# Patient Record
Sex: Female | Born: 1935 | Race: White | Hispanic: No | Smoking: Never smoker
Health system: Southern US, Community
[De-identification: ages and names within clinical notes are randomized; demographics above are authoritative.]

## PROBLEM LIST (undated history)

## (undated) DIAGNOSIS — R600 Localized edema: Secondary | ICD-10-CM

## (undated) DIAGNOSIS — Z7901 Long term (current) use of anticoagulants: Secondary | ICD-10-CM

## (undated) DIAGNOSIS — K449 Diaphragmatic hernia without obstruction or gangrene: Secondary | ICD-10-CM

## (undated) DIAGNOSIS — I10 Essential (primary) hypertension: Secondary | ICD-10-CM

## (undated) DIAGNOSIS — I872 Venous insufficiency (chronic) (peripheral): Secondary | ICD-10-CM

## (undated) DIAGNOSIS — Z86711 Personal history of pulmonary embolism: Secondary | ICD-10-CM

## (undated) DIAGNOSIS — E785 Hyperlipidemia, unspecified: Secondary | ICD-10-CM

## (undated) DIAGNOSIS — J4520 Mild intermittent asthma, uncomplicated: Secondary | ICD-10-CM

## (undated) DIAGNOSIS — D6859 Other primary thrombophilia: Secondary | ICD-10-CM

## (undated) DIAGNOSIS — M1990 Unspecified osteoarthritis, unspecified site: Secondary | ICD-10-CM

## (undated) DIAGNOSIS — K649 Unspecified hemorrhoids: Secondary | ICD-10-CM

## (undated) DIAGNOSIS — N289 Disorder of kidney and ureter, unspecified: Secondary | ICD-10-CM

## (undated) DIAGNOSIS — I252 Old myocardial infarction: Secondary | ICD-10-CM

## (undated) DIAGNOSIS — Z973 Presence of spectacles and contact lenses: Secondary | ICD-10-CM

## (undated) DIAGNOSIS — R0602 Shortness of breath: Secondary | ICD-10-CM

## (undated) DIAGNOSIS — Z8673 Personal history of transient ischemic attack (TIA), and cerebral infarction without residual deficits: Secondary | ICD-10-CM

## (undated) DIAGNOSIS — Z86718 Personal history of other venous thrombosis and embolism: Secondary | ICD-10-CM

## (undated) DIAGNOSIS — I700 Atherosclerosis of aorta: Secondary | ICD-10-CM

## (undated) DIAGNOSIS — Z8679 Personal history of other diseases of the circulatory system: Secondary | ICD-10-CM

## (undated) DIAGNOSIS — K219 Gastro-esophageal reflux disease without esophagitis: Secondary | ICD-10-CM

## (undated) DIAGNOSIS — Z8711 Personal history of peptic ulcer disease: Secondary | ICD-10-CM

## (undated) DIAGNOSIS — I480 Paroxysmal atrial fibrillation: Secondary | ICD-10-CM

## (undated) DIAGNOSIS — R32 Unspecified urinary incontinence: Secondary | ICD-10-CM

## (undated) DIAGNOSIS — R609 Edema, unspecified: Secondary | ICD-10-CM

## (undated) DIAGNOSIS — R3915 Urgency of urination: Secondary | ICD-10-CM

## (undated) DIAGNOSIS — G4762 Sleep related leg cramps: Secondary | ICD-10-CM

## (undated) DIAGNOSIS — Z8719 Personal history of other diseases of the digestive system: Secondary | ICD-10-CM

## (undated) DIAGNOSIS — Z8709 Personal history of other diseases of the respiratory system: Secondary | ICD-10-CM

## (undated) DIAGNOSIS — I5042 Chronic combined systolic (congestive) and diastolic (congestive) heart failure: Secondary | ICD-10-CM

## (undated) DIAGNOSIS — K5730 Diverticulosis of large intestine without perforation or abscess without bleeding: Secondary | ICD-10-CM

## (undated) DIAGNOSIS — G4733 Obstructive sleep apnea (adult) (pediatric): Secondary | ICD-10-CM

## (undated) DIAGNOSIS — I272 Pulmonary hypertension, unspecified: Secondary | ICD-10-CM

## (undated) DIAGNOSIS — D869 Sarcoidosis, unspecified: Secondary | ICD-10-CM

## (undated) DIAGNOSIS — I2510 Atherosclerotic heart disease of native coronary artery without angina pectoris: Secondary | ICD-10-CM

## (undated) DIAGNOSIS — I517 Cardiomegaly: Secondary | ICD-10-CM

## (undated) DIAGNOSIS — I459 Conduction disorder, unspecified: Secondary | ICD-10-CM

## (undated) DIAGNOSIS — F419 Anxiety disorder, unspecified: Secondary | ICD-10-CM

## (undated) DIAGNOSIS — I447 Left bundle-branch block, unspecified: Secondary | ICD-10-CM

## (undated) DIAGNOSIS — D071 Carcinoma in situ of vulva: Secondary | ICD-10-CM

## (undated) DIAGNOSIS — Z87410 Personal history of cervical dysplasia: Secondary | ICD-10-CM

## (undated) DIAGNOSIS — Z950 Presence of cardiac pacemaker: Secondary | ICD-10-CM

## (undated) DIAGNOSIS — IMO0001 Reserved for inherently not codable concepts without codable children: Secondary | ICD-10-CM

## (undated) HISTORY — DX: Presence of cardiac pacemaker: Z95.0

## (undated) HISTORY — PX: ABDOMINAL HYSTERECTOMY: SHX81

## (undated) HISTORY — DX: Venous insufficiency (chronic) (peripheral): I87.2

## (undated) HISTORY — DX: Hyperlipidemia, unspecified: E78.5

## (undated) HISTORY — PX: TUBAL LIGATION: SHX77

## (undated) HISTORY — DX: Essential (primary) hypertension: I10

## (undated) HISTORY — PX: TRANSTHORACIC ECHOCARDIOGRAM: SHX275

## (undated) HISTORY — DX: Anxiety disorder, unspecified: F41.9

## (undated) HISTORY — PX: OTHER SURGICAL HISTORY: SHX169

## (undated) HISTORY — PX: LUMBAR DISC SURGERY: SHX700

## (undated) HISTORY — DX: Disorder of kidney and ureter, unspecified: N28.9

## (undated) HISTORY — PX: CARDIAC CATHETERIZATION: SHX172

## (undated) HISTORY — PX: KNEE ARTHROSCOPY: SUR90

## (undated) HISTORY — DX: Sarcoidosis, unspecified: D86.9

## (undated) HISTORY — PX: CARDIAC PACEMAKER PLACEMENT: SHX583

---

## 1964-12-18 HISTORY — PX: APPENDECTOMY: SHX54

## 1996-01-19 HISTORY — PX: CHOLECYSTECTOMY: SHX55

## 2001-09-09 ENCOUNTER — Other Ambulatory Visit: Admission: RE | Admit: 2001-09-09 | Discharge: 2001-09-09 | Payer: Self-pay | Admitting: Obstetrics and Gynecology

## 2002-02-04 ENCOUNTER — Ambulatory Visit (HOSPITAL_COMMUNITY): Admission: RE | Admit: 2002-02-04 | Discharge: 2002-02-04 | Payer: Self-pay | Admitting: Pulmonary Disease

## 2002-02-04 ENCOUNTER — Encounter: Payer: Self-pay | Admitting: Pulmonary Disease

## 2002-03-10 ENCOUNTER — Other Ambulatory Visit: Admission: RE | Admit: 2002-03-10 | Discharge: 2002-03-10 | Payer: Self-pay | Admitting: Obstetrics and Gynecology

## 2002-03-20 ENCOUNTER — Encounter: Admission: RE | Admit: 2002-03-20 | Discharge: 2002-05-02 | Payer: Self-pay | Admitting: *Deleted

## 2003-08-25 ENCOUNTER — Ambulatory Visit (HOSPITAL_COMMUNITY): Admission: RE | Admit: 2003-08-25 | Discharge: 2003-08-25 | Payer: Self-pay | Admitting: Cardiology

## 2003-08-26 ENCOUNTER — Ambulatory Visit (HOSPITAL_COMMUNITY): Admission: RE | Admit: 2003-08-26 | Discharge: 2003-08-27 | Payer: Self-pay | Admitting: Cardiology

## 2003-08-26 ENCOUNTER — Encounter: Payer: Self-pay | Admitting: Cardiology

## 2003-09-23 ENCOUNTER — Encounter: Payer: Self-pay | Admitting: Pulmonary Disease

## 2003-09-23 ENCOUNTER — Ambulatory Visit (HOSPITAL_COMMUNITY): Admission: RE | Admit: 2003-09-23 | Discharge: 2003-09-23 | Payer: Self-pay | Admitting: Pulmonary Disease

## 2003-11-08 ENCOUNTER — Ambulatory Visit (HOSPITAL_BASED_OUTPATIENT_CLINIC_OR_DEPARTMENT_OTHER): Admission: RE | Admit: 2003-11-08 | Discharge: 2003-11-08 | Payer: Self-pay | Admitting: Pulmonary Disease

## 2003-11-08 ENCOUNTER — Encounter: Payer: Self-pay | Admitting: Pulmonary Disease

## 2004-03-22 ENCOUNTER — Encounter: Payer: Self-pay | Admitting: Cardiology

## 2004-03-22 ENCOUNTER — Ambulatory Visit (HOSPITAL_COMMUNITY): Admission: RE | Admit: 2004-03-22 | Discharge: 2004-03-22 | Payer: Self-pay | Admitting: Cardiology

## 2004-05-17 ENCOUNTER — Encounter: Admission: RE | Admit: 2004-05-17 | Discharge: 2004-08-15 | Payer: Self-pay | Admitting: Internal Medicine

## 2004-09-20 ENCOUNTER — Encounter: Admission: RE | Admit: 2004-09-20 | Discharge: 2004-12-19 | Payer: Self-pay | Admitting: Internal Medicine

## 2004-10-20 ENCOUNTER — Ambulatory Visit: Payer: Self-pay | Admitting: Internal Medicine

## 2004-11-17 ENCOUNTER — Ambulatory Visit: Payer: Self-pay | Admitting: Internal Medicine

## 2004-12-15 ENCOUNTER — Ambulatory Visit: Payer: Self-pay | Admitting: *Deleted

## 2004-12-26 ENCOUNTER — Encounter: Admission: RE | Admit: 2004-12-26 | Discharge: 2005-03-26 | Payer: Self-pay | Admitting: Internal Medicine

## 2005-01-12 ENCOUNTER — Ambulatory Visit: Payer: Self-pay | Admitting: Cardiology

## 2005-02-09 ENCOUNTER — Ambulatory Visit: Payer: Self-pay | Admitting: Internal Medicine

## 2005-03-09 ENCOUNTER — Ambulatory Visit: Payer: Self-pay | Admitting: Cardiology

## 2005-04-06 ENCOUNTER — Ambulatory Visit: Payer: Self-pay | Admitting: Cardiology

## 2005-04-06 ENCOUNTER — Ambulatory Visit: Payer: Self-pay | Admitting: Internal Medicine

## 2005-05-04 ENCOUNTER — Ambulatory Visit: Payer: Self-pay | Admitting: Cardiology

## 2005-06-01 ENCOUNTER — Ambulatory Visit: Payer: Self-pay | Admitting: Internal Medicine

## 2005-06-29 ENCOUNTER — Ambulatory Visit: Payer: Self-pay | Admitting: Cardiology

## 2005-07-27 ENCOUNTER — Ambulatory Visit: Payer: Self-pay | Admitting: Internal Medicine

## 2005-08-15 ENCOUNTER — Ambulatory Visit: Payer: Self-pay | Admitting: Pulmonary Disease

## 2005-08-18 ENCOUNTER — Ambulatory Visit: Payer: Self-pay | Admitting: Pulmonary Disease

## 2005-08-24 ENCOUNTER — Ambulatory Visit: Payer: Self-pay | Admitting: Internal Medicine

## 2005-08-25 ENCOUNTER — Other Ambulatory Visit: Admission: RE | Admit: 2005-08-25 | Discharge: 2005-08-25 | Payer: Self-pay | Admitting: Obstetrics and Gynecology

## 2005-09-03 ENCOUNTER — Emergency Department (HOSPITAL_COMMUNITY): Admission: EM | Admit: 2005-09-03 | Discharge: 2005-09-03 | Payer: Self-pay | Admitting: Emergency Medicine

## 2005-09-07 ENCOUNTER — Ambulatory Visit: Payer: Self-pay | Admitting: Pulmonary Disease

## 2005-09-13 ENCOUNTER — Encounter: Admission: RE | Admit: 2005-09-13 | Discharge: 2005-09-13 | Payer: Self-pay | Admitting: Neurology

## 2005-09-13 ENCOUNTER — Inpatient Hospital Stay (HOSPITAL_COMMUNITY): Admission: EM | Admit: 2005-09-13 | Discharge: 2005-09-15 | Payer: Self-pay | Admitting: Emergency Medicine

## 2005-09-14 ENCOUNTER — Ambulatory Visit: Payer: Self-pay | Admitting: Cardiology

## 2005-09-14 ENCOUNTER — Encounter: Payer: Self-pay | Admitting: Cardiology

## 2005-09-15 ENCOUNTER — Ambulatory Visit: Payer: Self-pay | Admitting: Pulmonary Disease

## 2005-09-15 ENCOUNTER — Ambulatory Visit: Payer: Self-pay | Admitting: Physical Medicine & Rehabilitation

## 2005-09-15 ENCOUNTER — Inpatient Hospital Stay (HOSPITAL_COMMUNITY)
Admission: RE | Admit: 2005-09-15 | Discharge: 2005-09-21 | Payer: Self-pay | Admitting: Physical Medicine & Rehabilitation

## 2005-09-26 ENCOUNTER — Encounter
Admission: RE | Admit: 2005-09-26 | Discharge: 2005-10-17 | Payer: Self-pay | Admitting: Physical Medicine & Rehabilitation

## 2005-10-17 ENCOUNTER — Ambulatory Visit: Payer: Self-pay | Admitting: Pulmonary Disease

## 2005-10-20 ENCOUNTER — Ambulatory Visit: Payer: Self-pay | Admitting: Cardiology

## 2005-11-14 ENCOUNTER — Ambulatory Visit: Payer: Self-pay | Admitting: Internal Medicine

## 2005-11-15 ENCOUNTER — Encounter: Admission: RE | Admit: 2005-11-15 | Discharge: 2005-12-27 | Payer: Self-pay | Admitting: Family Medicine

## 2006-08-15 ENCOUNTER — Ambulatory Visit: Payer: Self-pay | Admitting: Pulmonary Disease

## 2006-10-03 ENCOUNTER — Ambulatory Visit: Payer: Self-pay | Admitting: Pulmonary Disease

## 2006-11-21 ENCOUNTER — Ambulatory Visit: Payer: Self-pay | Admitting: Internal Medicine

## 2007-02-06 ENCOUNTER — Ambulatory Visit: Payer: Self-pay | Admitting: Pulmonary Disease

## 2007-03-19 ENCOUNTER — Ambulatory Visit: Payer: Self-pay | Admitting: Pulmonary Disease

## 2007-03-19 LAB — CONVERTED CEMR LAB
ALT: 19 units/L (ref 0–40)
AST: 25 units/L (ref 0–37)
Albumin: 3.6 g/dL (ref 3.5–5.2)
Basophils Absolute: 0.1 10*3/uL (ref 0.0–0.1)
Bilirubin, Direct: 0.1 mg/dL (ref 0.0–0.3)
Calcium: 9.5 mg/dL (ref 8.4–10.5)
Chloride: 101 meq/L (ref 96–112)
Eosinophils Absolute: 0.2 10*3/uL (ref 0.0–0.6)
GFR calc Af Amer: 80 mL/min
GFR calc non Af Amer: 66 mL/min
Lymphocytes Relative: 22.1 % (ref 12.0–46.0)
MCHC: 35 g/dL (ref 30.0–36.0)
MCV: 88.7 fL (ref 78.0–100.0)
Neutro Abs: 4.3 10*3/uL (ref 1.4–7.7)
Platelets: 234 10*3/uL (ref 150–400)
RBC: 4.1 M/uL (ref 3.87–5.11)
Sed Rate: 35 mm/hr — ABNORMAL HIGH (ref 0–25)
Sodium: 139 meq/L (ref 135–145)

## 2007-03-21 ENCOUNTER — Ambulatory Visit: Payer: Self-pay | Admitting: Internal Medicine

## 2007-04-03 ENCOUNTER — Ambulatory Visit: Payer: Self-pay | Admitting: Pulmonary Disease

## 2007-04-03 LAB — CONVERTED CEMR LAB
Angiotensin 1 Converting Enzyme: 65 units/L (ref 9–67)
Anti Nuclear Antibody(ANA): NEGATIVE

## 2007-04-22 ENCOUNTER — Ambulatory Visit (HOSPITAL_BASED_OUTPATIENT_CLINIC_OR_DEPARTMENT_OTHER): Admission: RE | Admit: 2007-04-22 | Discharge: 2007-04-22 | Payer: Self-pay | Admitting: Surgery

## 2007-04-22 ENCOUNTER — Encounter (INDEPENDENT_AMBULATORY_CARE_PROVIDER_SITE_OTHER): Payer: Self-pay | Admitting: Specialist

## 2007-04-22 HISTORY — PX: OTHER SURGICAL HISTORY: SHX169

## 2007-06-11 ENCOUNTER — Ambulatory Visit: Payer: Self-pay | Admitting: Pulmonary Disease

## 2007-08-05 ENCOUNTER — Ambulatory Visit: Payer: Self-pay | Admitting: Pulmonary Disease

## 2007-08-05 LAB — CONVERTED CEMR LAB
ALT: 22 units/L (ref 0–35)
AST: 28 units/L (ref 0–37)
Alkaline Phosphatase: 45 units/L (ref 39–117)
Angiotensin 1 Converting Enzyme: 39 units/L (ref 9–67)
BUN: 21 mg/dL (ref 6–23)
Basophils Relative: 0 % (ref 0.0–1.0)
Bilirubin, Direct: 0.1 mg/dL (ref 0.0–0.3)
CO2: 30 meq/L (ref 19–32)
Calcium: 9.5 mg/dL (ref 8.4–10.5)
Chloride: 104 meq/L (ref 96–112)
Creatinine, Ser: 1.1 mg/dL (ref 0.4–1.2)
Eosinophils Relative: 0.5 % (ref 0.0–5.0)
GFR calc Af Amer: 64 mL/min
Glucose, Bld: 116 mg/dL — ABNORMAL HIGH (ref 70–99)
Lymphocytes Relative: 9.2 % — ABNORMAL LOW (ref 12.0–46.0)
Monocytes Relative: 4.9 % (ref 3.0–11.0)
Neutro Abs: 9.9 10*3/uL — ABNORMAL HIGH (ref 1.4–7.7)
Platelets: 254 10*3/uL (ref 150–400)
RDW: 13.8 % (ref 11.5–14.6)
Total Bilirubin: 0.8 mg/dL (ref 0.3–1.2)
Total Protein: 7.8 g/dL (ref 6.0–8.3)
WBC: 11.7 10*3/uL — ABNORMAL HIGH (ref 4.5–10.5)

## 2007-09-30 DIAGNOSIS — K649 Unspecified hemorrhoids: Secondary | ICD-10-CM | POA: Insufficient documentation

## 2007-09-30 DIAGNOSIS — F411 Generalized anxiety disorder: Secondary | ICD-10-CM | POA: Insufficient documentation

## 2007-09-30 DIAGNOSIS — I6350 Cerebral infarction due to unspecified occlusion or stenosis of unspecified cerebral artery: Secondary | ICD-10-CM | POA: Insufficient documentation

## 2007-09-30 DIAGNOSIS — I272 Pulmonary hypertension, unspecified: Secondary | ICD-10-CM | POA: Insufficient documentation

## 2007-09-30 DIAGNOSIS — M545 Low back pain: Secondary | ICD-10-CM | POA: Insufficient documentation

## 2007-09-30 DIAGNOSIS — J209 Acute bronchitis, unspecified: Secondary | ICD-10-CM | POA: Insufficient documentation

## 2007-10-08 ENCOUNTER — Other Ambulatory Visit: Admission: RE | Admit: 2007-10-08 | Discharge: 2007-10-08 | Payer: Self-pay | Admitting: Obstetrics and Gynecology

## 2007-11-21 ENCOUNTER — Ambulatory Visit: Payer: Self-pay | Admitting: Internal Medicine

## 2007-12-10 ENCOUNTER — Ambulatory Visit: Payer: Self-pay | Admitting: Pulmonary Disease

## 2007-12-10 DIAGNOSIS — D869 Sarcoidosis, unspecified: Secondary | ICD-10-CM | POA: Insufficient documentation

## 2007-12-10 DIAGNOSIS — I10 Essential (primary) hypertension: Secondary | ICD-10-CM | POA: Insufficient documentation

## 2007-12-10 DIAGNOSIS — E669 Obesity, unspecified: Secondary | ICD-10-CM

## 2007-12-10 DIAGNOSIS — K279 Peptic ulcer, site unspecified, unspecified as acute or chronic, without hemorrhage or perforation: Secondary | ICD-10-CM | POA: Insufficient documentation

## 2007-12-10 DIAGNOSIS — J310 Chronic rhinitis: Secondary | ICD-10-CM

## 2007-12-10 DIAGNOSIS — I872 Venous insufficiency (chronic) (peripheral): Secondary | ICD-10-CM | POA: Insufficient documentation

## 2007-12-11 DIAGNOSIS — R55 Syncope and collapse: Secondary | ICD-10-CM

## 2007-12-13 ENCOUNTER — Encounter: Payer: Self-pay | Admitting: Pulmonary Disease

## 2007-12-23 ENCOUNTER — Telehealth (INDEPENDENT_AMBULATORY_CARE_PROVIDER_SITE_OTHER): Payer: Self-pay | Admitting: *Deleted

## 2007-12-23 LAB — CONVERTED CEMR LAB
CO2: 32 meq/L (ref 19–32)
Chloride: 104 meq/L (ref 96–112)
Creatinine, Ser: 1 mg/dL (ref 0.4–1.2)
Potassium: 4.4 meq/L (ref 3.5–5.1)
Sed Rate: 34 mm/hr — ABNORMAL HIGH (ref 0–25)
Sodium: 142 meq/L (ref 135–145)

## 2007-12-31 ENCOUNTER — Telehealth: Payer: Self-pay | Admitting: Pulmonary Disease

## 2008-01-31 ENCOUNTER — Encounter: Payer: Self-pay | Admitting: Pulmonary Disease

## 2008-02-03 ENCOUNTER — Encounter: Payer: Self-pay | Admitting: Pulmonary Disease

## 2008-02-04 ENCOUNTER — Ambulatory Visit: Payer: Self-pay | Admitting: Pulmonary Disease

## 2008-02-06 ENCOUNTER — Encounter: Payer: Self-pay | Admitting: Pulmonary Disease

## 2008-02-06 ENCOUNTER — Ambulatory Visit: Payer: Self-pay | Admitting: Family Medicine

## 2008-03-17 ENCOUNTER — Ambulatory Visit (HOSPITAL_BASED_OUTPATIENT_CLINIC_OR_DEPARTMENT_OTHER): Admission: RE | Admit: 2008-03-17 | Discharge: 2008-03-17 | Payer: Self-pay | Admitting: Orthopedic Surgery

## 2008-03-17 HISTORY — PX: OTHER SURGICAL HISTORY: SHX169

## 2008-04-03 ENCOUNTER — Telehealth (INDEPENDENT_AMBULATORY_CARE_PROVIDER_SITE_OTHER): Payer: Self-pay | Admitting: *Deleted

## 2008-04-20 ENCOUNTER — Telehealth (INDEPENDENT_AMBULATORY_CARE_PROVIDER_SITE_OTHER): Payer: Self-pay | Admitting: *Deleted

## 2008-05-04 ENCOUNTER — Ambulatory Visit: Payer: Self-pay | Admitting: Pulmonary Disease

## 2008-06-18 ENCOUNTER — Encounter: Payer: Self-pay | Admitting: Pulmonary Disease

## 2008-07-31 ENCOUNTER — Inpatient Hospital Stay (HOSPITAL_COMMUNITY): Admission: EM | Admit: 2008-07-31 | Discharge: 2008-08-05 | Payer: Self-pay | Admitting: Emergency Medicine

## 2008-07-31 ENCOUNTER — Ambulatory Visit: Payer: Self-pay | Admitting: Cardiology

## 2008-07-31 ENCOUNTER — Ambulatory Visit: Payer: Self-pay

## 2008-08-02 ENCOUNTER — Encounter: Payer: Self-pay | Admitting: Cardiology

## 2008-08-14 ENCOUNTER — Encounter: Payer: Self-pay | Admitting: Pulmonary Disease

## 2008-08-17 ENCOUNTER — Ambulatory Visit: Payer: Self-pay

## 2008-08-25 ENCOUNTER — Ambulatory Visit: Payer: Self-pay | Admitting: Cardiovascular Disease

## 2008-09-04 ENCOUNTER — Ambulatory Visit: Payer: Self-pay | Admitting: Pulmonary Disease

## 2008-09-05 DIAGNOSIS — Z950 Presence of cardiac pacemaker: Secondary | ICD-10-CM | POA: Insufficient documentation

## 2008-09-05 DIAGNOSIS — I2510 Atherosclerotic heart disease of native coronary artery without angina pectoris: Secondary | ICD-10-CM

## 2008-09-05 DIAGNOSIS — E785 Hyperlipidemia, unspecified: Secondary | ICD-10-CM | POA: Insufficient documentation

## 2008-09-07 ENCOUNTER — Telehealth (INDEPENDENT_AMBULATORY_CARE_PROVIDER_SITE_OTHER): Payer: Self-pay | Admitting: *Deleted

## 2008-09-11 ENCOUNTER — Encounter: Payer: Self-pay | Admitting: Pulmonary Disease

## 2008-09-15 ENCOUNTER — Telehealth: Payer: Self-pay | Admitting: Pulmonary Disease

## 2008-11-17 ENCOUNTER — Ambulatory Visit: Payer: Self-pay | Admitting: Internal Medicine

## 2008-11-17 ENCOUNTER — Ambulatory Visit: Payer: Self-pay | Admitting: Cardiovascular Disease

## 2008-11-17 LAB — CONVERTED CEMR LAB
AST: 21 units/L (ref 0–37)
Albumin: 3.6 g/dL (ref 3.5–5.2)
Alkaline Phosphatase: 61 units/L (ref 39–117)
Cholesterol: 154 mg/dL (ref 0–200)
HDL: 37.2 mg/dL — ABNORMAL LOW (ref >39.0)
LDL Cholesterol: 93 mg/dL (ref 0–99)
Total Protein: 8 g/dL (ref 6.0–8.3)
Triglycerides: 117 mg/dL (ref 0–149)

## 2009-01-05 ENCOUNTER — Ambulatory Visit: Payer: Self-pay | Admitting: Pulmonary Disease

## 2009-01-05 DIAGNOSIS — M1990 Unspecified osteoarthritis, unspecified site: Secondary | ICD-10-CM | POA: Insufficient documentation

## 2009-01-05 DIAGNOSIS — M542 Cervicalgia: Secondary | ICD-10-CM | POA: Insufficient documentation

## 2009-01-05 LAB — CONVERTED CEMR LAB
Albumin: 3.7 g/dL (ref 3.5–5.2)
Alkaline Phosphatase: 56 units/L (ref 39–117)
BUN: 17 mg/dL (ref 6–23)
Basophils Absolute: 0 10*3/uL (ref 0.0–0.1)
Eosinophils Absolute: 0.1 10*3/uL (ref 0.0–0.7)
Eosinophils Relative: 1.8 % (ref 0.0–5.0)
GFR calc Af Amer: 80 mL/min
GFR calc non Af Amer: 66 mL/min
HCT: 37.7 % (ref 36.0–46.0)
MCHC: 34.4 g/dL (ref 30.0–36.0)
MCV: 90 fL (ref 78.0–100.0)
Monocytes Absolute: 0.9 10*3/uL (ref 0.1–1.0)
Neutrophils Relative %: 72.4 % (ref 43.0–77.0)
Platelets: 158 10*3/uL (ref 150–400)
Potassium: 4.7 meq/L (ref 3.5–5.1)
RDW: 13.2 % (ref 11.5–14.6)
Sodium: 141 meq/L (ref 135–145)
Total Bilirubin: 0.9 mg/dL (ref 0.3–1.2)

## 2009-01-08 ENCOUNTER — Encounter: Payer: Self-pay | Admitting: Pulmonary Disease

## 2009-01-18 ENCOUNTER — Telehealth (INDEPENDENT_AMBULATORY_CARE_PROVIDER_SITE_OTHER): Payer: Self-pay | Admitting: *Deleted

## 2009-02-04 ENCOUNTER — Encounter: Payer: Self-pay | Admitting: Pulmonary Disease

## 2009-02-08 ENCOUNTER — Ambulatory Visit (HOSPITAL_COMMUNITY): Admission: RE | Admit: 2009-02-08 | Discharge: 2009-02-09 | Payer: Self-pay | Admitting: Orthopedic Surgery

## 2009-02-08 HISTORY — PX: SHOULDER ARTHROSCOPY WITH ROTATOR CUFF REPAIR AND SUBACROMIAL DECOMPRESSION: SHX5686

## 2009-04-07 ENCOUNTER — Encounter (INDEPENDENT_AMBULATORY_CARE_PROVIDER_SITE_OTHER): Payer: Self-pay | Admitting: *Deleted

## 2009-04-13 ENCOUNTER — Encounter: Admission: RE | Admit: 2009-04-13 | Discharge: 2009-04-13 | Payer: Self-pay | Admitting: Orthopedic Surgery

## 2009-06-22 ENCOUNTER — Telehealth: Payer: Self-pay | Admitting: Pulmonary Disease

## 2009-07-05 ENCOUNTER — Ambulatory Visit: Payer: Self-pay | Admitting: Pulmonary Disease

## 2009-07-06 ENCOUNTER — Ambulatory Visit: Payer: Self-pay | Admitting: Pulmonary Disease

## 2009-07-11 DIAGNOSIS — G4733 Obstructive sleep apnea (adult) (pediatric): Secondary | ICD-10-CM

## 2009-07-11 LAB — CONVERTED CEMR LAB
ALT: 16 units/L (ref 0–35)
Basophils Relative: 1.1 % (ref 0.0–3.0)
Chloride: 109 meq/L (ref 96–112)
Cholesterol: 167 mg/dL (ref 0–200)
Eosinophils Relative: 5 % (ref 0.0–5.0)
GFR calc non Af Amer: 65.66 mL/min (ref >60)
Glucose, Bld: 85 mg/dL (ref 70–99)
HDL: 38.6 mg/dL — ABNORMAL LOW (ref >39.00)
Hgb A1c MFr Bld: 5.8 % (ref 4.6–6.5)
LDL Cholesterol: 100 mg/dL — ABNORMAL HIGH (ref 0–99)
Lymphocytes Relative: 20.2 % (ref 12.0–46.0)
Neutrophils Relative %: 59.3 % (ref 43.0–77.0)
Potassium: 3.8 meq/L (ref 3.5–5.1)
RBC: 4.1 M/uL (ref 3.87–5.11)
Sed Rate: 39 mm/hr — ABNORMAL HIGH (ref 0–22)
Sodium: 145 meq/L (ref 135–145)
Total Bilirubin: 0.7 mg/dL (ref 0.3–1.2)
Total Protein: 7.2 g/dL (ref 6.0–8.3)
VLDL: 28 mg/dL (ref 0.0–40.0)
Vit D, 25-Hydroxy: 32 ng/mL (ref 30–89)
WBC: 6.8 10*3/uL (ref 4.5–10.5)

## 2009-07-22 ENCOUNTER — Ambulatory Visit: Payer: Self-pay | Admitting: Pulmonary Disease

## 2009-08-05 ENCOUNTER — Ambulatory Visit (HOSPITAL_BASED_OUTPATIENT_CLINIC_OR_DEPARTMENT_OTHER): Admission: RE | Admit: 2009-08-05 | Discharge: 2009-08-05 | Payer: Self-pay | Admitting: Pulmonary Disease

## 2009-08-05 ENCOUNTER — Encounter: Payer: Self-pay | Admitting: Pulmonary Disease

## 2009-08-17 ENCOUNTER — Ambulatory Visit: Payer: Self-pay | Admitting: Pulmonary Disease

## 2009-08-18 ENCOUNTER — Telehealth (INDEPENDENT_AMBULATORY_CARE_PROVIDER_SITE_OTHER): Payer: Self-pay | Admitting: *Deleted

## 2009-08-26 ENCOUNTER — Ambulatory Visit: Payer: Self-pay | Admitting: Pulmonary Disease

## 2009-08-27 ENCOUNTER — Telehealth: Payer: Self-pay | Admitting: Pulmonary Disease

## 2009-09-07 ENCOUNTER — Ambulatory Visit: Payer: Self-pay | Admitting: Internal Medicine

## 2009-09-20 ENCOUNTER — Telehealth (INDEPENDENT_AMBULATORY_CARE_PROVIDER_SITE_OTHER): Payer: Self-pay | Admitting: *Deleted

## 2009-09-21 ENCOUNTER — Encounter: Payer: Self-pay | Admitting: Pulmonary Disease

## 2009-12-08 ENCOUNTER — Encounter: Payer: Self-pay | Admitting: Internal Medicine

## 2009-12-08 ENCOUNTER — Ambulatory Visit: Payer: Self-pay | Admitting: Internal Medicine

## 2009-12-21 ENCOUNTER — Encounter: Payer: Self-pay | Admitting: Internal Medicine

## 2010-01-03 ENCOUNTER — Ambulatory Visit: Payer: Self-pay | Admitting: Pulmonary Disease

## 2010-02-09 ENCOUNTER — Encounter: Payer: Self-pay | Admitting: Pulmonary Disease

## 2010-03-08 ENCOUNTER — Telehealth: Payer: Self-pay | Admitting: Pulmonary Disease

## 2010-03-09 ENCOUNTER — Encounter: Payer: Self-pay | Admitting: Internal Medicine

## 2010-03-09 ENCOUNTER — Ambulatory Visit: Payer: Self-pay | Admitting: Internal Medicine

## 2010-03-15 ENCOUNTER — Encounter: Payer: Self-pay | Admitting: Internal Medicine

## 2010-04-07 ENCOUNTER — Encounter: Payer: Self-pay | Admitting: Pulmonary Disease

## 2010-04-11 ENCOUNTER — Encounter: Payer: Self-pay | Admitting: Pulmonary Disease

## 2010-04-11 ENCOUNTER — Ambulatory Visit: Payer: Self-pay | Admitting: Internal Medicine

## 2010-04-12 ENCOUNTER — Encounter: Payer: Self-pay | Admitting: Pulmonary Disease

## 2010-06-11 ENCOUNTER — Emergency Department (HOSPITAL_COMMUNITY): Admission: EM | Admit: 2010-06-11 | Discharge: 2010-06-11 | Payer: Self-pay | Admitting: Family Medicine

## 2010-06-15 ENCOUNTER — Ambulatory Visit: Payer: Self-pay | Admitting: Internal Medicine

## 2010-06-16 ENCOUNTER — Telehealth (INDEPENDENT_AMBULATORY_CARE_PROVIDER_SITE_OTHER): Payer: Self-pay | Admitting: *Deleted

## 2010-06-27 ENCOUNTER — Ambulatory Visit: Payer: Self-pay | Admitting: Pulmonary Disease

## 2010-06-28 ENCOUNTER — Encounter: Payer: Self-pay | Admitting: Internal Medicine

## 2010-07-11 ENCOUNTER — Ambulatory Visit: Payer: Self-pay | Admitting: Pulmonary Disease

## 2010-07-18 HISTORY — PX: INSERTION OF VENA CAVA FILTER: SHX5871

## 2010-07-20 ENCOUNTER — Ambulatory Visit: Payer: Self-pay | Admitting: Internal Medicine

## 2010-07-20 ENCOUNTER — Telehealth: Payer: Self-pay | Admitting: Internal Medicine

## 2010-07-22 ENCOUNTER — Ambulatory Visit: Payer: Self-pay | Admitting: Internal Medicine

## 2010-07-22 ENCOUNTER — Ambulatory Visit: Payer: Self-pay | Admitting: Pulmonary Disease

## 2010-07-22 ENCOUNTER — Encounter: Payer: Self-pay | Admitting: Pulmonary Disease

## 2010-07-22 ENCOUNTER — Inpatient Hospital Stay (HOSPITAL_COMMUNITY): Admission: EM | Admit: 2010-07-22 | Discharge: 2010-07-29 | Payer: Self-pay | Admitting: Emergency Medicine

## 2010-07-22 DIAGNOSIS — I2782 Chronic pulmonary embolism: Secondary | ICD-10-CM

## 2010-07-24 ENCOUNTER — Encounter (INDEPENDENT_AMBULATORY_CARE_PROVIDER_SITE_OTHER): Payer: Self-pay | Admitting: Internal Medicine

## 2010-07-25 ENCOUNTER — Ambulatory Visit: Payer: Self-pay | Admitting: Vascular Surgery

## 2010-07-25 ENCOUNTER — Telehealth: Payer: Self-pay | Admitting: Pulmonary Disease

## 2010-07-25 ENCOUNTER — Encounter (INDEPENDENT_AMBULATORY_CARE_PROVIDER_SITE_OTHER): Payer: Self-pay | Admitting: Internal Medicine

## 2010-07-25 DIAGNOSIS — Z8709 Personal history of other diseases of the respiratory system: Secondary | ICD-10-CM

## 2010-07-25 HISTORY — DX: Personal history of other diseases of the respiratory system: Z87.09

## 2010-07-26 ENCOUNTER — Encounter: Payer: Self-pay | Admitting: Internal Medicine

## 2010-07-27 ENCOUNTER — Ambulatory Visit: Payer: Self-pay | Admitting: Physical Medicine & Rehabilitation

## 2010-07-29 ENCOUNTER — Inpatient Hospital Stay (HOSPITAL_COMMUNITY)
Admission: RE | Admit: 2010-07-29 | Discharge: 2010-08-06 | Payer: Self-pay | Admitting: Physical Medicine & Rehabilitation

## 2010-08-05 ENCOUNTER — Ambulatory Visit: Payer: Self-pay | Admitting: Physical Medicine & Rehabilitation

## 2010-08-08 ENCOUNTER — Telehealth (INDEPENDENT_AMBULATORY_CARE_PROVIDER_SITE_OTHER): Payer: Self-pay | Admitting: *Deleted

## 2010-08-08 ENCOUNTER — Encounter: Payer: Self-pay | Admitting: Pulmonary Disease

## 2010-08-11 ENCOUNTER — Encounter: Payer: Self-pay | Admitting: Pulmonary Disease

## 2010-08-17 ENCOUNTER — Ambulatory Visit: Payer: Self-pay | Admitting: Pulmonary Disease

## 2010-08-17 DIAGNOSIS — N390 Urinary tract infection, site not specified: Secondary | ICD-10-CM

## 2010-08-17 DIAGNOSIS — N259 Disorder resulting from impaired renal tubular function, unspecified: Secondary | ICD-10-CM | POA: Insufficient documentation

## 2010-08-17 LAB — CONVERTED CEMR LAB
AST: 37 units/L (ref 0–37)
Alkaline Phosphatase: 62 units/L (ref 39–117)
Bilirubin Urine: NEGATIVE
Eosinophils Relative: 1.6 % (ref 0.0–5.0)
GFR calc non Af Amer: 50.34 mL/min (ref >60)
HCT: 36.3 % (ref 36.0–46.0)
Hemoglobin, Urine: NEGATIVE
Lymphs Abs: 1 10*3/uL (ref 0.7–4.0)
MCV: 90.3 fL (ref 78.0–100.0)
Monocytes Absolute: 0.8 10*3/uL (ref 0.1–1.0)
Nitrite: POSITIVE
Platelets: 234 10*3/uL (ref 150.0–400.0)
Potassium: 5 meq/L (ref 3.5–5.1)
Sodium: 139 meq/L (ref 135–145)
Total Bilirubin: 0.7 mg/dL (ref 0.3–1.2)
Total Protein, Urine: 30 mg/dL
Urine Glucose: NEGATIVE mg/dL
WBC: 9.5 10*3/uL (ref 4.5–10.5)

## 2010-08-18 ENCOUNTER — Encounter: Payer: Self-pay | Admitting: Pulmonary Disease

## 2010-08-22 ENCOUNTER — Emergency Department (HOSPITAL_COMMUNITY): Admission: EM | Admit: 2010-08-22 | Discharge: 2010-08-22 | Payer: Self-pay | Admitting: Emergency Medicine

## 2010-08-23 ENCOUNTER — Encounter: Payer: Self-pay | Admitting: Pulmonary Disease

## 2010-08-29 ENCOUNTER — Inpatient Hospital Stay (HOSPITAL_COMMUNITY): Admission: EM | Admit: 2010-08-29 | Discharge: 2010-09-01 | Payer: Self-pay | Admitting: Emergency Medicine

## 2010-08-29 ENCOUNTER — Ambulatory Visit: Payer: Self-pay | Admitting: Surgery

## 2010-08-29 ENCOUNTER — Telehealth (INDEPENDENT_AMBULATORY_CARE_PROVIDER_SITE_OTHER): Payer: Self-pay | Admitting: *Deleted

## 2010-08-29 ENCOUNTER — Encounter: Payer: Self-pay | Admitting: Pulmonary Disease

## 2010-08-29 ENCOUNTER — Ambulatory Visit: Payer: Self-pay | Admitting: Internal Medicine

## 2010-08-29 DIAGNOSIS — I82409 Acute embolism and thrombosis of unspecified deep veins of unspecified lower extremity: Secondary | ICD-10-CM

## 2010-09-01 ENCOUNTER — Ambulatory Visit: Payer: Self-pay | Admitting: Oncology

## 2010-09-02 ENCOUNTER — Telehealth: Payer: Self-pay | Admitting: Pulmonary Disease

## 2010-09-02 ENCOUNTER — Encounter: Payer: Self-pay | Admitting: Pulmonary Disease

## 2010-09-03 ENCOUNTER — Encounter: Payer: Self-pay | Admitting: Pulmonary Disease

## 2010-09-07 ENCOUNTER — Inpatient Hospital Stay (HOSPITAL_COMMUNITY): Admission: EM | Admit: 2010-09-07 | Discharge: 2010-09-11 | Payer: Self-pay | Admitting: Emergency Medicine

## 2010-09-07 ENCOUNTER — Telehealth: Payer: Self-pay | Admitting: Pulmonary Disease

## 2010-09-07 ENCOUNTER — Encounter (INDEPENDENT_AMBULATORY_CARE_PROVIDER_SITE_OTHER): Payer: Self-pay | Admitting: Emergency Medicine

## 2010-09-09 ENCOUNTER — Ambulatory Visit: Payer: Self-pay | Admitting: Hematology & Oncology

## 2010-09-14 ENCOUNTER — Ambulatory Visit: Payer: Self-pay | Admitting: Pulmonary Disease

## 2010-09-18 ENCOUNTER — Inpatient Hospital Stay (HOSPITAL_COMMUNITY): Admission: EM | Admit: 2010-09-18 | Discharge: 2010-09-27 | Payer: Self-pay | Admitting: Emergency Medicine

## 2010-09-18 ENCOUNTER — Ambulatory Visit: Payer: Self-pay | Admitting: Vascular Surgery

## 2010-09-18 ENCOUNTER — Encounter (INDEPENDENT_AMBULATORY_CARE_PROVIDER_SITE_OTHER): Payer: Self-pay | Admitting: Emergency Medicine

## 2010-09-26 ENCOUNTER — Encounter: Payer: Self-pay | Admitting: Hematology & Oncology

## 2010-09-27 ENCOUNTER — Telehealth: Payer: Self-pay | Admitting: Pulmonary Disease

## 2010-09-27 ENCOUNTER — Ambulatory Visit: Payer: Self-pay | Admitting: Hematology & Oncology

## 2010-10-06 ENCOUNTER — Encounter: Payer: Self-pay | Admitting: Pulmonary Disease

## 2010-10-07 ENCOUNTER — Encounter: Payer: Self-pay | Admitting: Pulmonary Disease

## 2010-10-07 LAB — CBC WITH DIFFERENTIAL (CANCER CENTER ONLY)
BASO#: 0 10*3/uL (ref 0.0–0.2)
EOS%: 2.7 % (ref 0.0–7.0)
Eosinophils Absolute: 0.2 10*3/uL (ref 0.0–0.5)
LYMPH#: 1.3 10*3/uL (ref 0.9–3.3)
MCH: 26.5 pg (ref 26.0–34.0)
MCHC: 32.1 g/dL (ref 32.0–36.0)
MONO%: 9.7 % (ref 0.0–13.0)
NEUT%: 68.8 % (ref 39.6–80.0)
WBC: 6.8 10*3/uL (ref 3.9–10.0)

## 2010-10-11 LAB — COMPREHENSIVE METABOLIC PANEL
AST: 19 U/L (ref 0–37)
Albumin: 4 g/dL (ref 3.5–5.2)
BUN: 20 mg/dL (ref 6–23)
Calcium: 9.6 mg/dL (ref 8.4–10.5)
Chloride: 104 mEq/L (ref 96–112)
Creatinine, Ser: 1.19 mg/dL (ref 0.40–1.20)
Glucose, Bld: 123 mg/dL — ABNORMAL HIGH (ref 70–99)
Potassium: 4.3 mEq/L (ref 3.5–5.3)

## 2010-10-11 LAB — CARDIOLIPIN ANTIBODIES, IGG, IGM, IGA
Anticardiolipin IgA: 4 APL U/mL (ref <22)
Anticardiolipin IgG: 6 GPL U/mL (ref <23)
Anticardiolipin IgM: 8 MPL U/mL (ref <11)

## 2010-10-11 LAB — BETA-2 GLYCOPROTEIN ANTIBODIES: Beta-2-Glycoprotein I IgA: 3 A Units (ref <20)

## 2010-10-11 LAB — D-DIMER, QUANTITATIVE: D-Dimer, Quant: 2.79 ug/mL-FEU — ABNORMAL HIGH (ref 0.00–0.48)

## 2010-10-11 LAB — PROTHROMBIN GENE MUTATION

## 2010-10-11 LAB — ERYTHROPOIETIN: Erythropoietin: 32.1 m[IU]/mL (ref 2.6–34.0)

## 2010-10-11 LAB — FERRITIN: Ferritin: 28 ng/mL (ref 10–291)

## 2010-10-13 LAB — CHROMOGENIC FACTOR X: CHROM XA: 87.3 % (ref 86–146)

## 2010-10-17 ENCOUNTER — Ambulatory Visit: Payer: Self-pay

## 2010-10-17 ENCOUNTER — Encounter: Payer: Self-pay | Admitting: Internal Medicine

## 2010-10-18 ENCOUNTER — Ambulatory Visit: Payer: Self-pay | Admitting: Pulmonary Disease

## 2010-10-24 ENCOUNTER — Encounter: Payer: Self-pay | Admitting: Pulmonary Disease

## 2010-10-27 ENCOUNTER — Ambulatory Visit: Payer: Self-pay | Admitting: Hematology & Oncology

## 2010-10-28 ENCOUNTER — Encounter: Payer: Self-pay | Admitting: Pulmonary Disease

## 2010-10-28 LAB — CBC WITH DIFFERENTIAL (CANCER CENTER ONLY)
BASO#: 0 10*3/uL (ref 0.0–0.2)
BASO%: 0.4 % (ref 0.0–2.0)
EOS%: 1.7 % (ref 0.0–7.0)
HCT: 37.4 % (ref 34.8–46.6)
HGB: 11.9 g/dL (ref 11.6–15.9)
LYMPH%: 22 % (ref 14.0–48.0)
MCHC: 31.7 g/dL — ABNORMAL LOW (ref 32.0–36.0)
MCV: 82 fL (ref 81–101)
MONO#: 0.6 10*3/uL (ref 0.1–0.9)
NEUT%: 68.1 % (ref 39.6–80.0)
RDW: 14.3 % (ref 10.5–14.6)

## 2010-11-15 ENCOUNTER — Encounter: Payer: Self-pay | Admitting: Pulmonary Disease

## 2010-11-25 ENCOUNTER — Encounter: Payer: Self-pay | Admitting: Pulmonary Disease

## 2010-11-25 LAB — CBC WITH DIFFERENTIAL (CANCER CENTER ONLY)
BASO#: 0 10*3/uL (ref 0.0–0.2)
EOS%: 2.4 % (ref 0.0–7.0)
Eosinophils Absolute: 0.2 10*3/uL (ref 0.0–0.5)
HCT: 37.8 % (ref 34.8–46.6)
HGB: 12.3 g/dL (ref 11.6–15.9)
MCH: 26.7 pg (ref 26.0–34.0)
MCHC: 32.6 g/dL (ref 32.0–36.0)
MCV: 82 fL (ref 81–101)
MONO%: 11.6 % (ref 0.0–13.0)
NEUT%: 59.5 % (ref 39.6–80.0)

## 2010-12-06 IMAGING — CR DG CHEST 2V
2 series · 2 of 2 positions shown · non-contrast
Comparison: PA and lateral chest 08/17/2010.

CLINICAL DATA: Fever and shortness of breath.  Cough.

CHEST - 2 VIEW

[w chest pa]
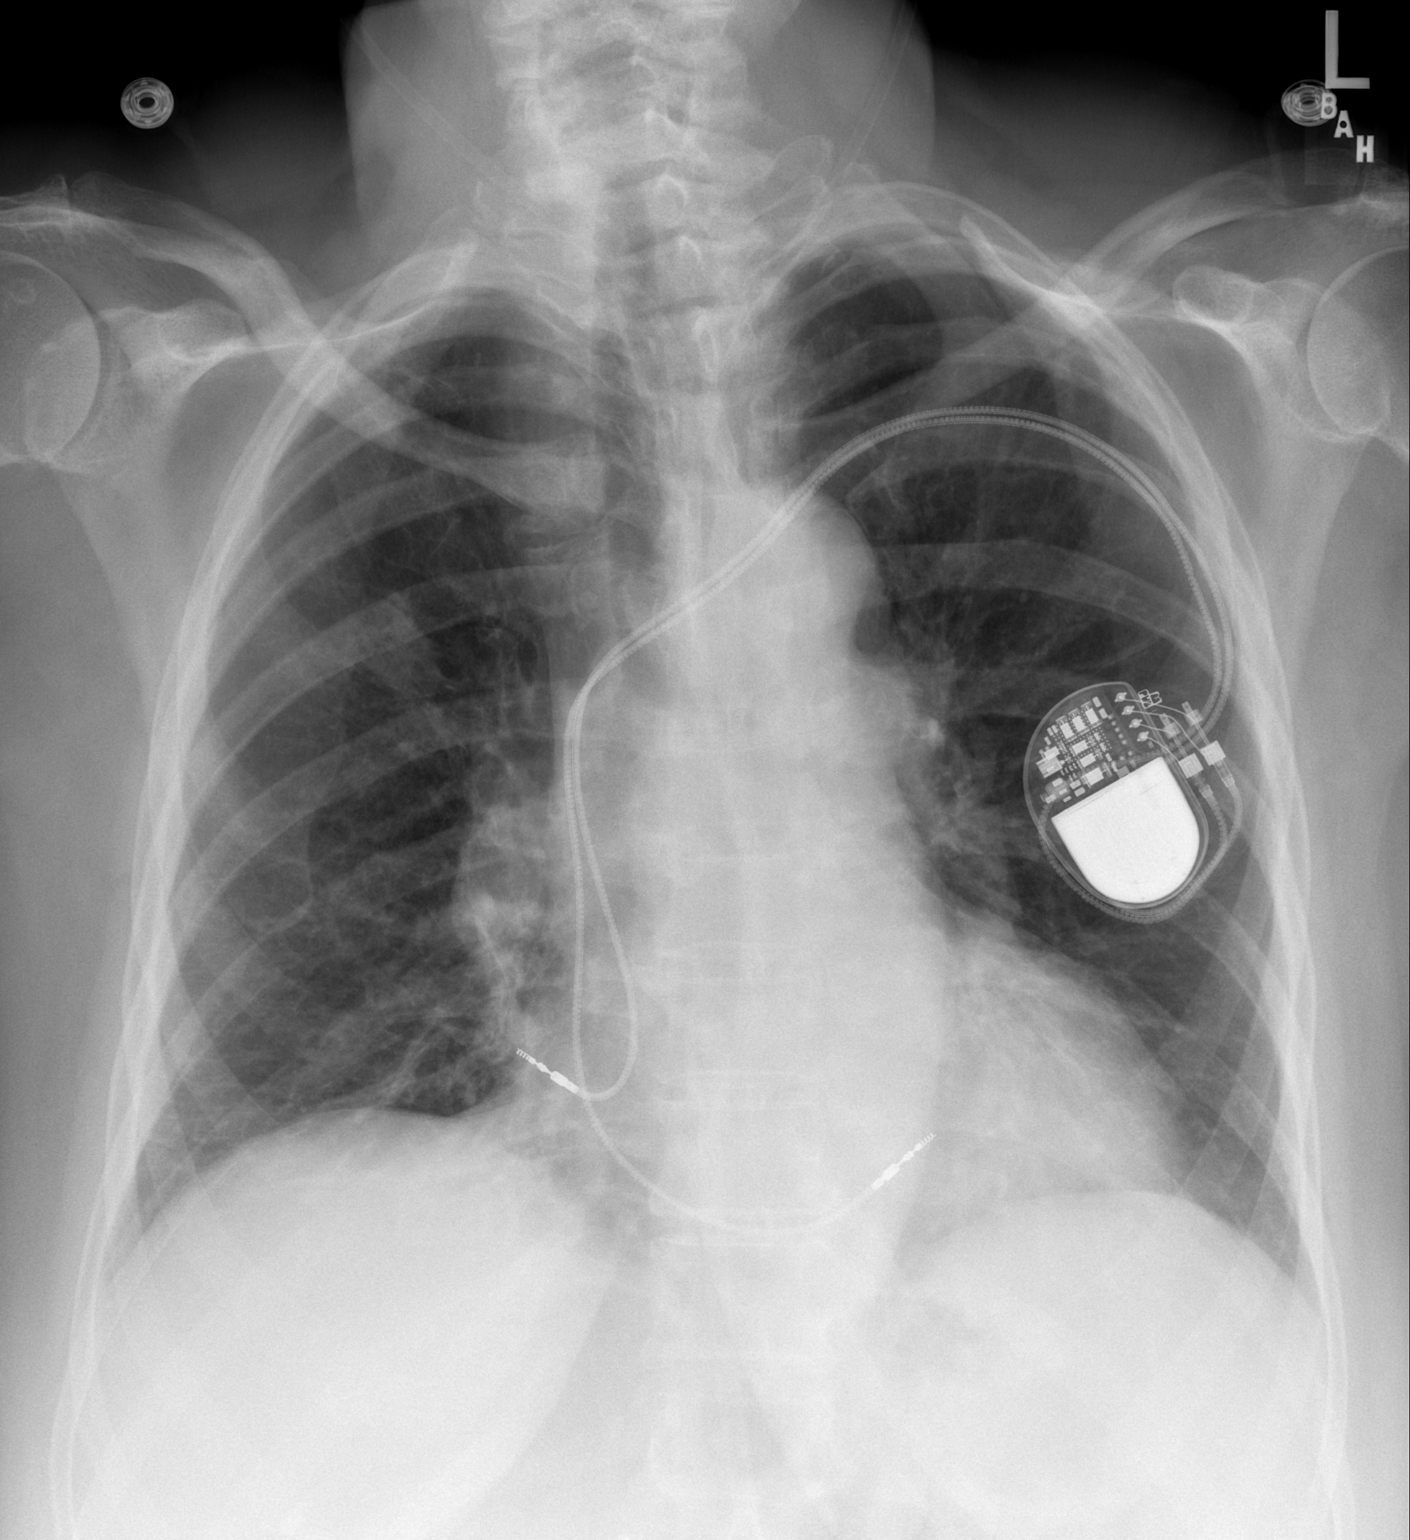

[w chest lat]
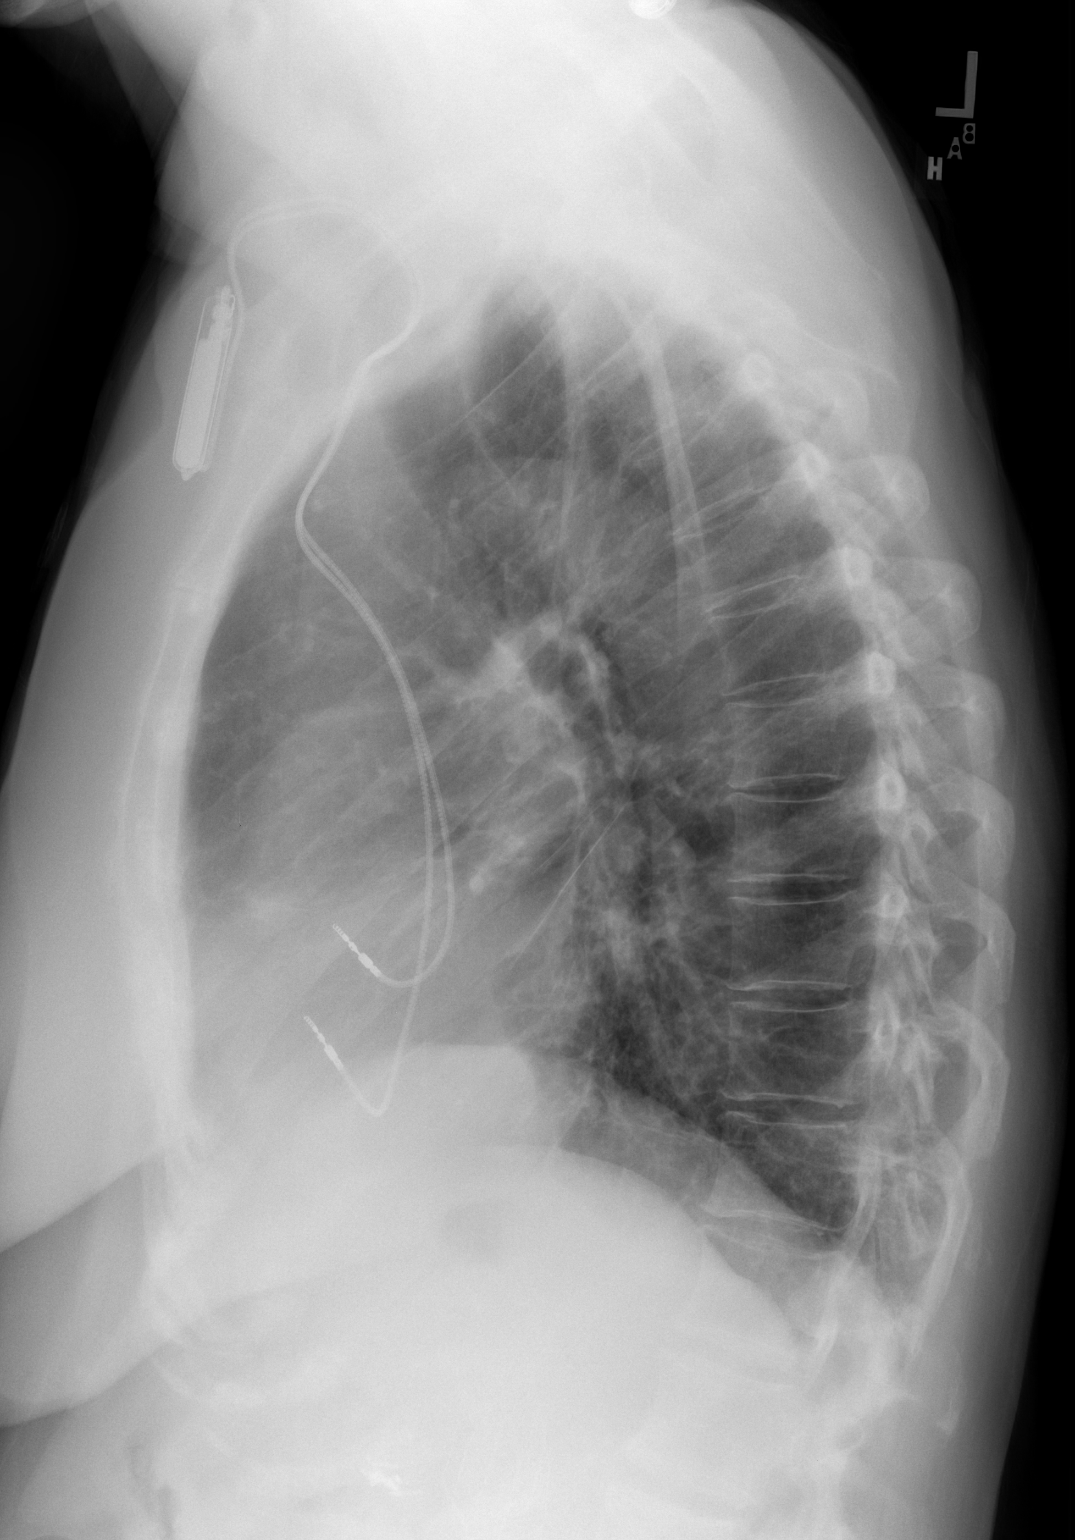

[2 of 2 positions shown; findings below may reference images not displayed]

FINDINGS: Mild atelectasis or scar right lung base unchanged.
Lungs otherwise clear.  Heart size normal.  No effusion.
IMPRESSION: No acute disease.

## 2010-12-15 ENCOUNTER — Encounter: Payer: Self-pay | Admitting: Pulmonary Disease

## 2011-01-09 ENCOUNTER — Telehealth (INDEPENDENT_AMBULATORY_CARE_PROVIDER_SITE_OTHER): Payer: Self-pay | Admitting: *Deleted

## 2011-01-10 ENCOUNTER — Other Ambulatory Visit: Payer: Self-pay | Admitting: Pulmonary Disease

## 2011-01-10 ENCOUNTER — Ambulatory Visit
Admission: RE | Admit: 2011-01-10 | Discharge: 2011-01-10 | Payer: Self-pay | Source: Home / Self Care | Attending: Pulmonary Disease | Admitting: Pulmonary Disease

## 2011-01-10 LAB — HEPATIC FUNCTION PANEL
ALT: 15 U/L (ref 0–35)
AST: 22 U/L (ref 0–37)
Alkaline Phosphatase: 56 U/L (ref 39–117)
Bilirubin, Direct: 0.1 mg/dL (ref 0.0–0.3)
Total Bilirubin: 0.8 mg/dL (ref 0.3–1.2)
Total Protein: 7.6 g/dL (ref 6.0–8.3)

## 2011-01-10 LAB — LIPID PANEL
Cholesterol: 148 mg/dL (ref 0–200)
HDL: 41.3 mg/dL (ref >39.00)
LDL Cholesterol: 78 mg/dL (ref 0–99)
Total CHOL/HDL Ratio: 4
Triglycerides: 143 mg/dL (ref 0.0–149.0)
VLDL: 28.6 mg/dL (ref 0.0–40.0)

## 2011-01-10 LAB — CBC WITH DIFFERENTIAL/PLATELET
HCT: 37.6 % (ref 36.0–46.0)
Hemoglobin: 12.5 g/dL (ref 12.0–15.0)
MCHC: 33.2 g/dL (ref 30.0–36.0)
Platelets: 168 10*3/uL (ref 150.0–400.0)
RDW: 18 % — ABNORMAL HIGH (ref 11.5–14.6)

## 2011-01-10 LAB — BASIC METABOLIC PANEL: CO2: 33 mEq/L — ABNORMAL HIGH (ref 19–32)

## 2011-01-13 ENCOUNTER — Ambulatory Visit: Payer: Self-pay | Admitting: Hematology & Oncology

## 2011-01-16 ENCOUNTER — Other Ambulatory Visit: Payer: Self-pay | Admitting: Hematology & Oncology

## 2011-01-16 ENCOUNTER — Encounter: Payer: Self-pay | Admitting: Pulmonary Disease

## 2011-01-16 ENCOUNTER — Ambulatory Visit (INDEPENDENT_AMBULATORY_CARE_PROVIDER_SITE_OTHER)
Admission: RE | Admit: 2011-01-16 | Discharge: 2011-01-16 | Payer: Medicare Other | Source: Home / Self Care | Attending: Hematology & Oncology | Admitting: Hematology & Oncology

## 2011-01-16 DIAGNOSIS — M79609 Pain in unspecified limb: Secondary | ICD-10-CM

## 2011-01-16 DIAGNOSIS — I82409 Acute embolism and thrombosis of unspecified deep veins of unspecified lower extremity: Secondary | ICD-10-CM

## 2011-01-16 DIAGNOSIS — Z86718 Personal history of other venous thrombosis and embolism: Secondary | ICD-10-CM

## 2011-01-16 LAB — CBC WITH DIFFERENTIAL (CANCER CENTER ONLY)
BASO%: 0.4 % (ref 0.0–2.0)
EOS%: 3.1 % (ref 0.0–7.0)
LYMPH#: 1.4 10*3/uL (ref 0.9–3.3)
MCHC: 33.2 g/dL (ref 32.0–36.0)
NEUT#: 4.2 10*3/uL (ref 1.5–6.5)
NEUT%: 64.1 % (ref 39.6–80.0)
Platelets: 196 10*3/uL (ref 145–400)
RDW: 15.7 % — ABNORMAL HIGH (ref 10.5–14.6)
WBC: 6.6 10*3/uL (ref 3.9–10.0)

## 2011-01-16 LAB — RETICULOCYTES (CHCC)
ABS Retic: 40.7 10*3/uL (ref 19.0–186.0)
Retic Ct Pct: 0.9 % (ref 0.4–3.1)

## 2011-01-16 LAB — CHCC SATELLITE - SMEAR

## 2011-01-17 ENCOUNTER — Ambulatory Visit
Admission: RE | Admit: 2011-01-17 | Discharge: 2011-01-17 | Payer: Self-pay | Source: Home / Self Care | Attending: Pulmonary Disease | Admitting: Pulmonary Disease

## 2011-01-17 NOTE — Letter (Signed)
[facility]   Imported By: Lennie Odor 11/11/2010 14:41:08  _____________________________________________________________________  External Attachment:    Type:   Image     Comment:   External Document

## 2011-01-17 NOTE — Procedures (Signed)
Summary: pacer check/Appoinrment ia a 12:00 noon/nm      Allergies Added:   Current Medications (verified): 1)  Nasonex 50 Mcg/act Susp (Mometasone Furoate) .... 2 Sp in Each Nostril At Bedtime.[name] KitchenMarland Kitchen 2)  Zyrtec Allergy 10 Mg  Tabs (Cetirizine Hcl) .[name] Kitchen.. 1 By Mouth Once Daily 3)  Prednisone 5 Mg  Tabs (Prednisone) .... Take As Directed 4)  Adult Aspirin Ec Low Strength 81 Mg  Tbec (Aspirin) .[name] Kitchen.. 1 By Mouth Once Daily 5)  Metoprolol Succinate 25 Mg Xr24h-Tab (Metoprolol Succinate) .... Take 1 Tablet By Mouth Once A Day 6)  Diovan 160 Mg  Tabs (Valsartan) .[name] Kitchen.. 1 By Mouth Once Daily 7)  Hydrochlorothiazide 25 Mg Tabs (Hydrochlorothiazide) .[name] Kitchen.. 1 By Mouth Once Daily 8)  Klor-Con M20 20 Meq  Tbcr (Potassium Chloride Crys Cr) .[name] Kitchen.. 1 By Mouth Once Daily 9)  Pravastatin Sodium 40 Mg Tabs (Pravastatin Sodium) .... Take 1 Tablet By Mouth Once A Day 10)  Omeprazole 40 Mg Cpdr (Omeprazole) .... Take 1 Tablet By Mouth Two Times A Day 11)  Etodolac 400 Mg Tabs (Etodolac) .... Take 1 Tab By Mouth Two Times A Day W/ Food As Needed For Arthritis Pain... 12)  Oscal 500/200 D-3 500-200 Mg-Unit  Tabs (Calcium-Vitamin D) .... Take One Tablet By Mouth Three Times A Day 13)  Centrum   Tabs (Multiple Vitamins-Minerals) .[name] Kitchen.. 1 By Mouth Once Daily 14)  Protegra   Caps (Multiple Vitamins-Minerals) .[name] Kitchen.. 1 By Mouth Once Daily 15)  Vitamin D-1000 Max St [phone] Mg-Unit Tabs (Calcium-Vitamin D) .... Once Daily 16)  Clorazepate Dipotassium 7.5 Mg  Tabs (Clorazepate Dipotassium) .... Take 1 Tab By Mouth Three Times A Day As Needed For Nerves...  Allergies (verified): 1)  ! Sulfa  PPM Specifications Following MD:  Lewayne Bunting, MD     PPM Vendor:  [employer]     PPM Model Number:  [id number]     PPM Serial Number:  [id number] PPM DOI:  08/04/2008     PPM Implanting MD:  Lewayne Bunting, MD  Lead 1    Location: RA     DOI: 08/04/2008     Model #: [id number]     Serial #: [id number]     Status: active Lead 2    Location: RV     DOI:  08/04/2008     Model #: [id number]     Serial #: [id number]     Status: active   Indications:  Complete heart block   PPM Follow Up Battery Voltage:  2.79 V     Battery Est. Longevity:  9.5 YRS     Pacer Dependent:  Yes       PPM Device Measurements Atrium  Amplitude: 5.60 mV, Impedance: 434 ohms, Threshold: 0.50 V at 0.40 msec Right Ventricle  Amplitude: PACED mV, Impedance: 574 ohms, Threshold: 1.00 V at 0.40 msec  Episodes MS Episodes:  27     Percent Mode Switch:  <0.1%     Ventricular High Rate:  0     Atrial Pacing:  18.6%     Ventricular Pacing:  99.8%  Parameters Mode:  DDDR     Lower Rate Limit:  60     Upper Rate Limit:  130 Paced AV Delay:  170     Sensed AV Delay:  150 Next Cardiology Appt Due:  10/18/2010 Tech Comments:  PT CALLED WITH SOB AND RAPID HEARTBEAT W/EXERCISE.  27 MODE SWITCHES AND 1 VHR EPISODE ON 04-12-10.  PT HR AT REST 65.  PT  WALKED AROUND OFC AND HR WAS 106 bpm.  PT HAD SOME SOB  BUT DIDNT FEEL RAPID HR. CHANGED RA OUTPUT FROM 1.5 TO 2.0 AND RV OUTPUT FROM 2.00 TO 2.5 V.  ROV IN 3 MTHS W/GT. Vella Kohler  July 21, 2010 10:33 AM

## 2011-01-17 NOTE — Cardiovascular Report (Signed)
Summary: Office Visit   Office Visit   Imported By: Roderic Ovens 07/25/2010 15:09:04  _____________________________________________________________________  External Attachment:    Type:   Image     Comment:   External Document

## 2011-01-17 NOTE — Assessment & Plan Note (Signed)
Summary: NP follow up - allergic rx   Copy to:  Kriste Basque Primary Provider/Referring Provider:  Kriste Basque  CC:  rash on arms and chest area x2weeks - pt states she has started no new meds recently.  states dermatologist classified as an allergic rx and gave eucerin cream.  pt takes furosemide 20mg  and states dermatologist thinks this may have caused the allergic rx b/c of its relation to sulfa.  History of Present Illness: 75 y/o WF w/ hx prob sarcoidosis and mild pulm HTN (based on this, obesity, mild sleep apnea, & hx DVT)...    Hospitalized by Cardiology 8/09 w/ high grade block & cath showing non-obstructive CAD, HBP & decr LVF= 40%... Med Rx per [facility] & DrTaylor placed pacemaker... he added Lipitor for her Chol but she doesn't like this med "I can't function" muscle aches & he switched her to Pravachol 12/09 (tolerating better so far)... her BP has been under fair control w/ METOPROLOL 25mg /d, DIOVAN 160mg /d, and LASIX 20mg - 1-2tabs/d & K20/d...   ~  July 05, 2009:  states that she is doing OK... wants refills for 90day supplies... she is c/o "sleepy" & has been taking Soma... Obese w/ weight 216#, had sleep study 2004 w/ RDI= 16 & O2 desat to 78%... also had large # leg jerks but without sleep disruption... we discussed poss repeat sleep study, appt w/ DrClance...   ~  January 03, 2010:  repeat sleep study 8/10 showed mild OSA w/ AHI=15 & desat to 83%... she refused CPAP & opted for the dental appliance- went to [facility] DrEssick w/ mandibular advancement appliance made & she really likes it!!!  Also saw DrTaylor 9/10 w/ pacer functioning well- & BP reasonably well controlled on meds... no new complaints or concerns today... we will check CXR, but she prefers to wait until return OV for her fasting blood work...  June 27, 2010--Presents for work in visit. Complains of rash on arms and chest area x2weeks - states she has started no new meds recently.  states dermatologist classified as an allergic  rx and gave eucerin cream.  pt takes furosemide 20mg  and states dermatologist thinks this may have caused the allergic rx b/c of its relation to sulfa. Describes as pruritic at times. No new meds, skin products. Wants an alternative to lasix. We discussed that most of the diurectics have a very small amount of sulfa deritivie. She has been on lasix for years. Eucerin helps. Denies chest pain, dyspnea, orthopnea, hemoptysis, fever, n/v/d, edema, dysphagia, wheezing.     Medications Prior to Update: 1)  Nasonex 50 Mcg/act Susp (Mometasone Furoate) .... 2 Sp in Each Nostril At Bedtime.[name] KitchenMarland Kitchen 2)  Zyrtec Allergy 10 Mg  Tabs (Cetirizine Hcl) .[name] Kitchen.. 1 By Mouth Once Daily 3)  Prednisone 5 Mg  Tabs (Prednisone) .... Take As Directed 4)  Adult Aspirin Ec Low Strength 81 Mg  Tbec (Aspirin) .[name] Kitchen.. 1 By Mouth Once Daily 5)  Metoprolol Succinate 25 Mg Xr24h-Tab (Metoprolol Succinate) .... Take 1 Tablet By Mouth Once A Day 6)  Diovan 160 Mg  Tabs (Valsartan) .[name] Kitchen.. 1 By Mouth Once Daily 7)  Lasix 20 Mg  Tabs (Furosemide) .[name] Kitchen.. 1-2 Tabs By Mouth Each Morning As Directed... 8)  Klor-Con M20 20 Meq  Tbcr (Potassium Chloride Crys Cr) .[name] Kitchen.. 1 By Mouth Once Daily 9)  Pravastatin Sodium 40 Mg Tabs (Pravastatin Sodium) .... Take 1 Tablet By Mouth Once A Day 10)  Omeprazole 40 Mg Cpdr (Omeprazole) .... Take 1 Tablet By Mouth Two Times  A Day 11)  Etodolac 400 Mg Tabs (Etodolac) .... Take 1 Tab By Mouth Two Times A Day W/ Food As Needed For Arthritis Pain... 12)  Oscal 500/200 D-3 500-200 Mg-Unit  Tabs (Calcium-Vitamin D) .... Take One Tablet By Mouth Three Times A Day 13)  Centrum   Tabs (Multiple Vitamins-Minerals) .[name] Kitchen.. 1 By Mouth Once Daily 14)  Protegra   Caps (Multiple Vitamins-Minerals) .[name] Kitchen.. 1 By Mouth Once Daily 15)  Vitamin D-1000 Max St [phone] Mg-Unit Tabs (Calcium-Vitamin D) .... Once Daily 16)  Clorazepate Dipotassium 7.5 Mg  Tabs (Clorazepate Dipotassium) .... Take 1 Tab By Mouth Three Times A Day As Needed For  Nerves...  Current Medications (verified): 1)  Nasonex 50 Mcg/act Susp (Mometasone Furoate) .... 2 Sp in Each Nostril At Bedtime.[name] KitchenMarland Kitchen 2)  Zyrtec Allergy 10 Mg  Tabs (Cetirizine Hcl) .[name] Kitchen.. 1 By Mouth Once Daily 3)  Prednisone 5 Mg  Tabs (Prednisone) .... Take As Directed 4)  Adult Aspirin Ec Low Strength 81 Mg  Tbec (Aspirin) .[name] Kitchen.. 1 By Mouth Once Daily 5)  Metoprolol Succinate 25 Mg Xr24h-Tab (Metoprolol Succinate) .... Take 1 Tablet By Mouth Once A Day 6)  Diovan 160 Mg  Tabs (Valsartan) .[name] Kitchen.. 1 By Mouth Once Daily 7)  Lasix 20 Mg  Tabs (Furosemide) .[name] Kitchen.. 1-2 Tabs By Mouth Each Morning As Directed... 8)  Klor-Con M20 20 Meq  Tbcr (Potassium Chloride Crys Cr) .[name] Kitchen.. 1 By Mouth Once Daily 9)  Pravastatin Sodium 40 Mg Tabs (Pravastatin Sodium) .... Take 1 Tablet By Mouth Once A Day 10)  Omeprazole 40 Mg Cpdr (Omeprazole) .... Take 1 Tablet By Mouth Two Times A Day 11)  Etodolac 400 Mg Tabs (Etodolac) .... Take 1 Tab By Mouth Two Times A Day W/ Food As Needed For Arthritis Pain... 12)  Oscal 500/200 D-3 500-200 Mg-Unit  Tabs (Calcium-Vitamin D) .... Take One Tablet By Mouth Three Times A Day 13)  Centrum   Tabs (Multiple Vitamins-Minerals) .[name] Kitchen.. 1 By Mouth Once Daily 14)  Protegra   Caps (Multiple Vitamins-Minerals) .[name] Kitchen.. 1 By Mouth Once Daily 15)  Vitamin D-1000 Max St [phone] Mg-Unit Tabs (Calcium-Vitamin D) .... Once Daily 16)  Clorazepate Dipotassium 7.5 Mg  Tabs (Clorazepate Dipotassium) .... Take 1 Tab By Mouth Three Times A Day As Needed For Nerves...  Allergies (verified): 1)  ! Sulfa  Past History:  Past Medical History: Last updated: 01/03/2010 ALLERGIC RHINITIS (ICD-477.9) OBSTRUCTIVE SLEEP APNEA (ICD-327.23) SARCOIDOSIS (ICD-135) PULMONARY HYPERTENSION (ICD-416.8)-venous in origin from right heart cath in past. Hx of ASTHMATIC BRONCHITIS, ACUTE (ICD-466.0) HYPERTENSION (ICD-401.9) CORONARY ARTERY DISEASE (ICD-414.00) Hx of SYNCOPE (ICD-780.2) CARDIAC PACEMAKER IN SITU  (ICD-V45.01) VENOUS INSUFFICIENCY (ICD-459.81) Hx of DVT (ICD-453.40) HYPERLIPIDEMIA (ICD-272.4) OBESITY (ICD-278.00) Hx of PUD (ICD-533.90) HEMORRHOIDS (ICD-455.6) DEGENERATIVE JOINT DISEASE (ICD-715.90) CERVICALGIA (ICD-723.1) BACK PAIN, LUMBAR (ICD-724.2) Hx of STROKE (ICD-434.91) R/O OTHER CONVULSIONS (ICD-780.39) ANXIETY (ICD-300.00)  Past Surgical History: Last updated: 01/03/2010 S/P Appendectomy S/P Cholecystectomy by DrBlievernicht 2/97 S/P Hysterectomy S/P Lumbar Disc Surgery in 1990 S/P Lft Knee Arthroscopy  Family History: Last updated: 09/30/2008 Father died age 4 with MI Mother is 64 and in good health 2 siblings: 1 sister died of cancer at age 66 1 brother died of MI at age 91  Social History: Last updated: Sep 30, 2008 Widow 4 children retired non-smoker no alcohol  Risk Factors: Alcohol Use: 0 (07/22/2009)  Risk Factors: Smoking Status: never (07/22/2009)  Review of Systems      See HPI  Vital Signs:  Patient profile:   75 year  old female Height:      66 inches Weight:      209.25 pounds BMI:     33.90 O2 Sat:      96 % on Room air Temp:     98.2 degrees F oral Pulse rate:   85 / minute BP sitting:   130 / 80  (right arm) Cuff size:   large  Vitals Entered By: [name] CNA/MA (June 27, 2010 11:31 AM)  O2 Flow:  Room air  CC: rash on arms and chest area x2weeks - pt states she has started no new meds recently.  states dermatologist classified as an allergic rx and gave eucerin cream.  pt takes furosemide 20mg  and states dermatologist thinks this may have caused the allergic rx b/c of its relation to sulfa Is Patient Diabetic? No Comments Medications reviewed with patient Daytime contact number verified with patient. [name] CNA/MA  June 27, 2010 11:33 AM    Physical Exam  Additional Exam:  GEN: A/Ox3; pleasant , NAD HEENT:  /AT, , EACs-clear, TMs-wnl, NOSE-clear, THROAT-clear NECK:  Supple w/ fair ROM; no JVD;  normal carotid impulses w/o bruits; no thyromegaly or nodules palpated; no lymphadenopathy. RESP  Clear to P & A; w/o, wheezes/ rales/ or rhonchi. CARD:  RRR, no m/r/g   GI:   Soft & nt; nml bowel sounds; no organomegaly or masses detected. Musco: Warm bil,  no calf tenderness edema, clubbing, pulses intact Neuro: intact w/ no focal deficits noted.  Skin: intact, no lesions or rash noted. she points to several areas but nothing was seen.    Impression & Recommendations:  Problem # 1:  DERMATITIS (ICD-692.9)  ?etiology -possible med side effect from dermatiolgy. Note this is from pt, no notes available will try to obtain derm notes.  Would continue on zyrtec once daily  REC:  will switch lasix to hctz although most diuretics have sulfa derivitive in it, she wants to try alternative.  Low salt diet.  Continue with recommendations from dermatology.  Zyrtec 10mg  at bedtime  follow up Dr. Kriste Basque in 2 months as scheduled.  Her updated medication list for this problem includes:    Zyrtec Allergy 10 Mg Tabs (Cetirizine hcl) .[name] Kitchen... 1 by mouth once daily    Prednisone 5 Mg Tabs (Prednisone) .[name] Kitchen... Take as directed  Orders: Est. Patient Level III ([address])  Problem # 2:  VENOUS INSUFFICIENCY (ICD-459.81) change lasix to hctz.   Medications Added to Medication List This Visit: 1)  Hydrochlorothiazide 25 Mg Tabs (Hydrochlorothiazide) .[name] Kitchen.. 1 by mouth once daily  Complete Medication List: 1)  Nasonex 50 Mcg/act Susp (Mometasone furoate) .... 2 sp in each nostril at bedtime.[name] KitchenMarland Kitchen 2)  Zyrtec Allergy 10 Mg Tabs (Cetirizine hcl) .[name] Kitchen.. 1 by mouth once daily 3)  Prednisone 5 Mg Tabs (Prednisone) .... Take as directed 4)  Adult Aspirin Ec Low Strength 81 Mg Tbec (Aspirin) .[name] Kitchen.. 1 by mouth once daily 5)  Metoprolol Succinate 25 Mg Xr24h-tab (Metoprolol succinate) .... Take 1 tablet by mouth once a day 6)  Diovan 160 Mg Tabs (Valsartan) .[name] Kitchen.. 1 by mouth once daily 7)  Hydrochlorothiazide 25 Mg Tabs  (Hydrochlorothiazide) .[name] Kitchen.. 1 by mouth once daily 8)  Klor-con M20 20 Meq Tbcr (Potassium chloride crys cr) .[name] Kitchen.. 1 by mouth once daily 9)  Pravastatin Sodium 40 Mg Tabs (Pravastatin sodium) .... Take 1 tablet by mouth once a day 10)  Omeprazole 40 Mg Cpdr (Omeprazole) .... Take 1 tablet by mouth two times a day 11)  Etodolac 400 Mg Tabs (Etodolac) .... Take 1 tab by mouth two times a day w/ food as needed for arthritis pain... 12)  Oscal 500/200 D-3 500-200 Mg-unit Tabs (Calcium-vitamin d) .... Take one tablet by mouth three times a day 13)  Centrum Tabs (Multiple vitamins-minerals) .[name] Kitchen.. 1 by mouth once daily 14)  Protegra Caps (Multiple vitamins-minerals) .[name] Kitchen.. 1 by mouth once daily 15)  Vitamin D-1000 Max St [phone] Mg-unit Tabs (Calcium-vitamin d) .... Once daily 16)  Clorazepate Dipotassium 7.5 Mg Tabs (Clorazepate dipotassium) .... Take 1 tab by mouth three times a day as needed for nerves...  Patient Instructions: 1)  May switch Furosemide to Hydrochlorothiazide 25mg  once daily  2)  Low salt diet.  3)  Continue with recommendations from dermatology.  4)  Zyrtec 10mg  at bedtime  5)  follow up Dr. Kriste Basque in 2 months as scheduled.  Prescriptions: HYDROCHLOROTHIAZIDE 25 MG TABS (HYDROCHLOROTHIAZIDE) 1 by mouth once daily  #30 x 5   Entered and Authorized by:   Rubye Oaks NP   Signed by:   Earvin Blazier NP on 06/27/2010   Method used:   Electronically to        [id number] (retail)       [address]       [place], [place]  [address]       Ph: [phone] or [phone]       Fax: [phone]   RxID:   [id number]    Immunization History:  Influenza Immunization History:    Influenza:  historical (09/17/2009)

## 2011-01-17 NOTE — Cardiovascular Report (Signed)
Summary: Office Visit Remote   Office Visit Remote   Imported By: Roderic Ovens 06/29/2010 12:08:21  _____________________________________________________________________  External Attachment:    Type:   Image     Comment:   External Document

## 2011-01-17 NOTE — Progress Notes (Signed)
Summary: leg pain and numbness-pt advised to go to ER  Phone Note Call from Patient Call back at [phone]   Caller: nurse vivian Call For: nadel Summary of Call: pt having pain and numbness in her legs. trouble walking would like  office visit  today with dr Kriste Basque. Initial call taken by: Rickard Patience,  September 07, 2010 9:05 AM  Follow-up for Phone Call        Spoke to Maureen Ralphs and she states she saw the pt last night and she was c/o numbness and tingling in right upper thigh. Maureen Ralphs called the pt back this morning and she states she could not lay on her right side and that her feet were so swollen that she could not walk. Treasa School the pt to go to the ER to be evaluated because she was just discharged with diagnosis of DVT. I will forward to SN to make him aware pt went to ER. Carron Curie CMA  September 07, 2010 10:01 AM   Additional Follow-up for Phone Call Additional follow up Details #1::        SN is aware Randell Loop CMA  September 07, 2010 3:12 PM

## 2011-01-17 NOTE — Cardiovascular Report (Signed)
Summary: Office Visit Remote   Office Visit Remote   Imported By: Roderic Ovens 12/23/2009 12:47:56  _____________________________________________________________________  External Attachment:    Type:   Image     Comment:   External Document

## 2011-01-17 NOTE — Miscellaneous (Signed)
Summary: BONE DENSITY  Clinical Lists Changes  Orders: Added new Test order of T-Lumbar Vertebral Assessment (77082) - Signed Added new Test order of T-Bone Densitometry (77080) - Signed 

## 2011-01-17 NOTE — Letter (Signed)
Summary: CMN for Nebulizer/[facility]  CMN for Nebulizer/[facility]   Imported By: Sherian Rein 08/29/2010 08:26:43  _____________________________________________________________________  External Attachment:    Type:   Image     Comment:   External Document

## 2011-01-17 NOTE — Progress Notes (Signed)
Summary: prescript  Phone Note From Pharmacy Call back at [phone] ex [phone]   [facility]er[facility] Call For: [name] [name]  Summary of Call: calling about prescript that was faxed on sept 12 for duo neb solution. pt would like combination vials. Initial call taken by: Rickard Patience,  September 02, 2010 9:36 AM  Follow-up for Phone Call        Spoke with Halina Maidens and they did not receive signed fax so I gave the vrbal to the pharmacists. Carron Curie CMA  September 02, 2010 10:46 AM

## 2011-01-17 NOTE — Letter (Signed)
Summary: [facility]  [facility]   Imported By: Lester [place] 06/16/2010 10:31:29  _____________________________________________________________________  External Attachment:    Type:   Image     Comment:   External Document

## 2011-01-17 NOTE — Letter (Signed)
Summary: Remote Device Check  [facility], Main Office  [address]   [place], [place] [address]   Phone: [phone]  Fax: [phone]     March 15, 2010 MRN: [id number]   [facility] [address] [place], [place]  [address]   Dear Ms. Bagg,   Your remote transmission was recieved and reviewed by your physician.  All diagnostics were within normal limits for you.  __X___Your next transmission is scheduled for:    June 15, 2010.  Please transmit at any time this day.  If you have a wireless device your transmission will be sent automatically.     Sincerely,  Proofreader

## 2011-01-17 NOTE — Assessment & Plan Note (Signed)
Summary: rov for osa   Copy to:  Kriste Basque Primary Provider/Referring Provider:  Kriste Basque  CC:  Pt is here for a f/u appt.  Pt was last seen by [facility] Sept 2010.  Pt states she is currently on a dental appliance to treat her OSA.  Pt states Dr. Orpah Greek referred pt back to [facility] to discuss switching from oral appliance to a cpap.  Pt states Dr. Orpah Greek is worried oral appliance is not adequately treating OSA.  Pt states she feels very "tired throughout the day."  Pt states she goes to sleep approx 9 to 10pm and gets up approx 8 am.  pt states her sleep is "very restless" but believes this is d/t the heat.  [name] Kitchen  History of Present Illness: the pt comes in today for f/u of mild osa.  She is being treated with a dental appliance near her maximal protrusion, and does not feel she can tolerate further advancement.  She feels that she is not rested in the am's despite wearing her appliance, but does admit to chronic pain at night that can disrupt her sleep.  She denies any environmental issues, and gives no history suggestive of RLS.  She does take chlorazepate 3 times a day, and this certainly can make her sleepy.  Medications Prior to Update: 1)  Nasonex 50 Mcg/act Susp (Mometasone Furoate) .... 2 Sp in Each Nostril At Bedtime.[name] KitchenMarland Kitchen 2)  Zyrtec Allergy 10 Mg  Tabs (Cetirizine Hcl) .[name] Kitchen.. 1 By Mouth Once Daily 3)  Prednisone 5 Mg  Tabs (Prednisone) .... Take As Directed 4)  Adult Aspirin Ec Low Strength 81 Mg  Tbec (Aspirin) .[name] Kitchen.. 1 By Mouth Once Daily 5)  Metoprolol Succinate 25 Mg Xr24h-Tab (Metoprolol Succinate) .... Take 1 Tablet By Mouth Once A Day 6)  Diovan 160 Mg  Tabs (Valsartan) .[name] Kitchen.. 1 By Mouth Once Daily 7)  Hydrochlorothiazide 25 Mg Tabs (Hydrochlorothiazide) .[name] Kitchen.. 1 By Mouth Once Daily 8)  Klor-Con M20 20 Meq  Tbcr (Potassium Chloride Crys Cr) .[name] Kitchen.. 1 By Mouth Once Daily 9)  Pravastatin Sodium 40 Mg Tabs (Pravastatin Sodium) .... Take 1 Tablet By Mouth Once A Day 10)  Omeprazole 40 Mg Cpdr (Omeprazole) .... Take 1  Tablet By Mouth Two Times A Day 11)  Etodolac 400 Mg Tabs (Etodolac) .... Take 1 Tab By Mouth Two Times A Day W/ Food As Needed For Arthritis Pain... 12)  Oscal 500/200 D-3 500-200 Mg-Unit  Tabs (Calcium-Vitamin D) .... Take One Tablet By Mouth Three Times A Day 13)  Centrum   Tabs (Multiple Vitamins-Minerals) .[name] Kitchen.. 1 By Mouth Once Daily 14)  Protegra   Caps (Multiple Vitamins-Minerals) .[name] Kitchen.. 1 By Mouth Once Daily 15)  Vitamin D-1000 Max St [phone] Mg-Unit Tabs (Calcium-Vitamin D) .... Once Daily 16)  Clorazepate Dipotassium 7.5 Mg  Tabs (Clorazepate Dipotassium) .... Take 1 Tab By Mouth Three Times A Day As Needed For Nerves...  Allergies (verified): 1)  ! Sulfa  Past History:  Past medical, surgical, family and social histories (including risk factors) reviewed, and no changes noted (except as noted below).  Past Medical History: Reviewed history from 01/03/2010 and no changes required. ALLERGIC RHINITIS (ICD-477.9) OBSTRUCTIVE SLEEP APNEA (ICD-327.23) SARCOIDOSIS (ICD-135) PULMONARY HYPERTENSION (ICD-416.8)-venous in origin from right heart cath in past. 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) VENOUS INSUFFICIENCY (ICD-459.81) Hx of DVT (ICD-453.40) HYPERLIPIDEMIA (ICD-272.4) OBESITY (ICD-278.00) Hx of PUD (ICD-533.90) HEMORRHOIDS (ICD-455.6) DEGENERATIVE JOINT DISEASE (ICD-715.90)  CERVICALGIA (ICD-723.1) BACK PAIN, LUMBAR (ICD-724.2) Hx of STROKE (ICD-434.91) R/O OTHER CONVULSIONS (ICD-780.39) ANXIETY (ICD-300.00)  Past Surgical History: Reviewed history from 01/03/2010 and no changes required. S/P Appendectomy S/P Cholecystectomy by DrBlievernicht 2/97 S/P Hysterectomy S/P Lumbar Disc Surgery in 1990 S/P Lft Knee Arthroscopy  Family History: Reviewed history from 09/04/2008 and no changes required. Father died age 65 with MI Mother  is 85 and in good health 2 siblings: 1 sister died of cancer at age 74 1 brother died of MI at age 52  Social History: Reviewed history from 09/04/2008 and no changes required. Widow 4 children retired non-smoker no alcohol  Review of Systems  The patient denies shortness of breath with activity, shortness of breath at rest, productive cough, non-productive cough, coughing up blood, chest pain, irregular heartbeats, acid heartburn, indigestion, loss of appetite, weight change, abdominal pain, difficulty swallowing, sore throat, tooth/dental problems, headaches, nasal congestion/difficulty breathing through nose, sneezing, itching, ear ache, anxiety, depression, hand/feet swelling, joint stiffness or pain, rash, change in color of mucus, and fever.    Vital Signs:  Patient profile:   75 year old female Height:      66 inches Weight:      202.25 pounds BMI:     32.76 O2 Sat:      94 % on Room air Temp:     97.5 degrees F oral Pulse rate:   88 / minute BP sitting:   124 / 72  Vitals Entered By: Arman Filter LPN (July 11, 2010 2:05 PM)  O2 Flow:  Room air CC: Pt is here for a f/u appt.  Pt was last seen by [facility] Sept 2010.  Pt states she is currently on a dental appliance to treat her OSA.  Pt states Dr. Orpah Greek referred pt back to [facility] to discuss switching from oral appliance to a cpap.  Pt states Dr. Orpah Greek is worried oral appliance is not adequately treating OSA.  Pt states she feels very "tired throughout the day."  Pt states she goes to sleep approx 9 to 10pm and gets up approx 8 am.  pt states her sleep is "very restless" but believes this is d/t the heat.   Comments Medications reviewed with patient Arman Filter LPN  July 11, 2010 2:05 PM    Physical Exam  General:  ow female in nad Nose:  narrowed bilat but patent Mouth:  no signficant elongation of soft palate or uvula Lungs:  clear to auscultation Heart:  rrr Extremities:  mild edema, no cyanosis Neurologic:  alert,  does not appear sleepy, moves all 4.   Impression & Recommendations:  Problem # 1:  OBSTRUCTIVE SLEEP APNEA (ICD-327.23) the pt is wearing a dental appliance, but notes nonrestorative sleep and excessive daytime sleepiness.  It is unclear if this has anything to do with her osa.  She has known chronic pain that can interfere with sleep, and takes a hefty dose of sedating meds during the day which can contribute to her perception of daytime sleepiness.  At this point, we really need to document whether she is having persistent osa despite her dental appliance.  I would like to schedule her for a sleep study, and she is agreeable.  Other Orders: Est. Patient Level IV (69629) Sleep Disorder Referral (Sleep Disorder)  Patient Instructions: 1)  will set up for sleep study WHILE WEARING DENTAL APPLIANCE.  Will call you once results are available. 2)  will refill your clorazepate times one while Dr. Kriste Basque is on vacation.  Prescriptions: CLORAZEPATE DIPOTASSIUM 7.5 MG  TABS (CLORAZEPATE DIPOTASSIUM) take 1 tab by mouth three times a day as needed for nerves...  #90 x 0   Entered and Authorized by:   Barbaraann Share MD   Signed by:   Barbaraann Share MD on 07/11/2010   Method used:   Print then Give to Patient   RxID:   [id number]   Appended Document: rov for osa per [facility], he filled pt's Clorazepate as a 1 time curtesy to the pt while Dr. Kriste Basque was out of office.  All future refills need to go through Dr. Kriste Basque.

## 2011-01-17 NOTE — Assessment & Plan Note (Signed)
Summary: Hosp consult for PE   Vital Signs:  Patient profile:   75 year old female O2 Sat:      98 % on 2 L/min Temp:     98 degrees F Pulse rate:   98 / minute Pulse rhythm:   regular Resp:     18 per minute BP supine:   118 / 72  O2 Flow:  2 L/min  Copy to:  Kriste Basque Primary Provider/Referring Provider:  Kriste Basque   History of Present Illness: 75 y/o WF w/ hx prob sarcoidosis and mild pulm HTN (based on this, obesity, mild sleep apnea, & hx DVT). & cerebellar bleed on coumadin  Hospitalized by Cardiology 8/09 w/ high grade block & cath showing non-obstructive CAD, HBP & decr LVF= 40%... [facility] & DrTaylor placed pacemaker... he added Lipitor for her Chol but she doesn't like this med "I can't function" muscle aches & he switched her to Pravachol 12/09 (tolerating better so far).  July 22, 2010 5:23 PM  Presented with dyspnea x 3- 4 days, labs show acute renal failure cr 4.0 range, VQ scan shows several perfusion defects. She does not want to take coumadin at any cost. Pacer check ok 8/3.  Allergies: 1)  ! Sulfa Current Meds:  NASONEX 50 MCG/ACT SUSP (MOMETASONE FUROATE) 2 sp in each nostril at bedtime... ZYRTEC ALLERGY 10 MG  TABS (CETIRIZINE HCL) 1 by mouth once daily PREDNISONE 5 MG  TABS (PREDNISONE) Take as directed ADULT ASPIRIN EC LOW STRENGTH 81 MG  TBEC (ASPIRIN) 1 by mouth once daily METOPROLOL SUCCINATE 25 MG XR24H-TAB (METOPROLOL SUCCINATE) Take 1 tablet by mouth once a day DIOVAN 160 MG  TABS (VALSARTAN) 1 by mouth once daily HYDROCHLOROTHIAZIDE 25 MG TABS (HYDROCHLOROTHIAZIDE) 1 by mouth once daily KLOR-CON M20 20 MEQ  TBCR (POTASSIUM CHLORIDE CRYS CR) 1 by mouth once daily PRAVASTATIN SODIUM 40 MG TABS (PRAVASTATIN SODIUM) Take 1 tablet by mouth once a day OMEPRAZOLE 40 MG CPDR (OMEPRAZOLE) Take 1 tablet by mouth two times a day ETODOLAC 400 MG TABS (ETODOLAC) take 1 tab by mouth two times a day w/ food as needed for arthritis pain... OSCAL 500/200  D-3 500-200 MG-UNIT  TABS (CALCIUM-VITAMIN D) take one tablet by mouth three times a day CENTRUM   TABS (MULTIPLE VITAMINS-MINERALS) 1 by mouth once daily PROTEGRA   CAPS (MULTIPLE VITAMINS-MINERALS) 1 by mouth once daily VITAMIN D-1000 MAX ST [phone] MG-UNIT TABS (CALCIUM-VITAMIN D) once daily CLORAZEPATE DIPOTASSIUM 7.5 MG  TABS (CLORAZEPATE DIPOTASSIUM) take 1 tab by mouth three times a day as needed for nerves...   Past History:  Past Medical History: Last updated: 01/03/2010 ALLERGIC RHINITIS (ICD-477.9) OBSTRUCTIVE SLEEP APNEA (ICD-327.23) SARCOIDOSIS (ICD-135) PULMONARY HYPERTENSION (ICD-416.8)-venous in origin from right heart cath in past. 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) VENOUS INSUFFICIENCY (ICD-459.81) Hx of DVT (ICD-453.40) HYPERLIPIDEMIA (ICD-272.4) OBESITY (ICD-278.00) Hx of PUD (ICD-533.90) HEMORRHOIDS (ICD-455.6) DEGENERATIVE JOINT DISEASE (ICD-715.90) CERVICALGIA (ICD-723.1) BACK PAIN, LUMBAR (ICD-724.2) Hx of STROKE (ICD-434.91) R/O OTHER CONVULSIONS (ICD-780.39) ANXIETY (ICD-300.00)  Past Surgical History: Last updated: 01/03/2010 S/P Appendectomy S/P Cholecystectomy by DrBlievernicht 2/97 S/P Hysterectomy S/P Lumbar Disc Surgery in 1990 S/P Lft Knee Arthroscopy  Family History: Last updated: 09-19-08 Father died age 52 with MI Mother is 51 and in good health 2 siblings: 1 sister died of cancer at age 69 1 brother died of MI at age 42  Social History: Last  updated: 09/04/2008 Widow 4 children retired non-smoker no alcohol  Risk Factors: Smoking Status: never (07/22/2009)  Review of Systems       The patient complains of dyspnea on exertion.  The patient denies anorexia, fever, weight loss, weight gain, vision loss, decreased hearing, hoarseness, chest pain, syncope, peripheral edema, prolonged  cough, headaches, hemoptysis, abdominal pain, melena, hematochezia, severe indigestion/heartburn, hematuria, muscle weakness, suspicious skin lesions, difficulty walking, depression, unusual weight change, and abnormal bleeding.    Physical Exam  Additional Exam:  GEN: A/Ox3; pleasant , NAD HEENT:  [place]/AT, , EACs-clear, TMs-wnl, NOSE-clear, THROAT-clear NECK:  Supple w/ fair ROM; no JVD; normal carotid impulses w/o bruits; no thyromegaly or nodules palpated; no lymphadenopathy. RESP  Clear to P & A; w/o, wheezes/ rales/ or rhonchi. CARD:  RRR, no m/r/g   GI:   Soft & nt; nml bowel sounds; no organomegaly or masses detected. Musco: Warm bil,  no calf tenderness edema, clubbing, pulses intact Neuro: intact w/ no focal deficits noted.  Skin: intact, no lesions or rash noted. she points to several areas but nothing was seen.    Impression & Recommendations:  Problem # 1:  PULMONARY EMBOLISM (ICD-415.1) based on high prob VQ scan Head CT negative, she does not want coumadin at any cost due to h/o ICH in 2006. IV heparin probably OK in the acute setting - has been initiated by Cards. Discussed risks & benefits with pt & her mother. Explained that risk of future events remain high without anticoagulation -( with or without IVC filter ) Morever duplex is pending - Proceed with IVC filter given acute high risk of recurrence Need to re discuss long term anticoagulation - Sq  lovenox can be an option, risk for [facility] would be lower esp in prophylactic doses. Defer to Dr Kriste Basque. Her updated medication list for this problem includes:    Adult Aspirin Ec Low Strength 81 Mg Tbec (Aspirin) .[name] Kitchen... 1 by mouth once daily  Problem # 2:  Acute renal failure per [facility].

## 2011-01-17 NOTE — Assessment & Plan Note (Signed)
Summary: 1 month follow up/la   Primary Care Provider:  Kriste Basque  CC:  1 month ROV & post hosp check....  History of Present Illness: 75 y/o WF followed for mult medical problems...   ~  Aug09:  Hospitalized by Cardiology w/ high grade block & cath showing non-obstructive CAD, HBP & decr LVF= 40%... Med Rx per [facility] & DrTaylor placed pacemaker... he added Lipitor for her Chol but she doesn't like this med "I can't function" muscle aches & he switched her to Pravachol 12/09 (tolerating better)... her BP has been under fair control w/ METOPROLOL 25mg /d, DIOVAN 160mg /d, and LASIX 20mg - 1-2tabs/d & K20/d...   ~  Jul10:  states that she is doing OK... wants refills for 90day supplies... she is c/o "sleepy" & has been taking Soma... Obese w/ weight 216#, had sleep study 2004 w/ RDI= 16 & O2 desat to 78%... also had large # leg jerks but without sleep disruption... we discussed poss repeat sleep study, appt w/ DrClance...   ~  January 03, 2010:  repeat sleep study 8/10 showed mild OSA w/ AHI=15 & desat to 83%... she refused CPAP & opted for the dental appliance- went to [facility] DrEssick w/ mandibular advancement appliance made & she really likes it!!!  Also saw DrTaylor 9/10 w/ pacer functioning well- & BP reasonably well controlled on meds... no new complaints or concerns today... we will check CXR, but she prefers to wait until return OV for her fasting blood work...   ~  August 17, 2010:  in early Aug she developed sudden SOB- she thought it was her heart & saw DrTaylor but pacer was OK... went to ER w/ eval revealing bilat PE by V/Q (couldn't do CTA due to Creat=4.2), also had DDimer>20, BNP=1400, Troponin= 0.35, note- ven dopplers were neg for DVT... hx cerebellar hemorrhage (while on Coumadin in past) & Neuro consult said absolute contraindication for Coumadin Rx, therefore had IVC filter placed & transient IV heparin... Fluid management resulted in improved renal function & BNP (Creat=1.14 at end  of hosp, & BNP=400)... resp management w/ NEBS etc... she was tx to rehab & disch home 08/06/10> since then c/o weak- getting home PT, and nocturnal O2 qualified...    ~  September 14, 2010:  since I saw her last she has been adm x2 w/ DVT- hosp records reviewed in detail: Adm 9/12-15 & disch on Lovenox once daily but had recurrent leg swelling & pain w/ VenDopplers showing a new DVT & Adm 9/21-25 where Lovenox was incr to Bid... now taking Lovenox 80mg Bid- hypercoag w/u showed sl incr homocystine & DrEnnever rec Folic 1mg /d.... she has severe periph edema & wt up 9# in 71mo> we will incr Lasix to 40mg Qam... she is feeling much better, more mobile, & has a great attitude.   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 much improved now & he plans consult to [facility] (appt 11/29)... DCSummary reviewed & meds reconcilled> we discussed ROV 2-3  mo w/ CXR & fasting labs.    Problem List:  ALLERGIC RHINITIS (ICD-477.9) - she uses NASONEX & ZYRTEK per DrKozlow...  OBSTRUCTIVE SLEEP APNEA (ICD-327.23) - she is followed by DrClance...  ~  sleep study 2004 showed RDI= 16, desat to 78%, PLMS= 248 w/o sleep disruption... pt opted for  diet, weight reduction...  ~  repeat sleep study 8/10 showed mild OSA w/ AHI=15 & desat to 83%... she refused CPAP & opted for the dental appliance- went to [facility] DrEssick w/ mandibular advancement appliance made & she is very happy.  ~  f/u DrClance 7/11 w/ persistant daytime hypersomnolence- he considered repeat sleep study.  SARCOIDOSIS (ICD-135) - Eval for dyspnea revealed evid for pulmonary hypertension and CTChest 4/08 showed +lymphadenopathy... she had a supraclavicular node biopsy 5/08 by DrStreck that revealed extensive granulomatous inflammation... ACE level= 65, sed= 35, ANA=  neg, CBC/ chems= normal... PREDNISONE therapy started  & slowly weaned>>> now at 5mg - 1/2 tab Qod.  ~  PFT's 12/08 showed FVC=2.62 84%), FEV1=1.74 (71%), %1sec=67, mid-flows=33%pred...  ~  CXR 9/09 showed stable chr interstitial markings, no adenopathy, scarring at the bases, pacer, NAD...  ~  labs 9/09 showed ACE= 47... rec decr Pred5  to 1/2 tab Qod...  ~  CT Chest 4/10 showed calcif hilar & mediast nodes- old gran dis, bilat scarring is stable (no active lung dis), asymmetric osteoarth in sternoclav joint...  ~  CXR 1/11 showed stable scarring & pacer, NAD.[name] Kitchen.  ~  CXR 8/11 showed similar w/ borderline heart size, pacer, prom PA, & scarring...  PULMONARY HYPERTENSION (ICD-416.8) -  Eval by Cardiology & DrClance 2004- he noted low PVR readings, eventually had sleep study done- see above;  TEE 4/05 showed norm LVF w/ EF=55-65%, no regional wall motion abn, mild MR, mod TR & incr peak pulm sys pressure & norm RV;  etiology believed to be poss combination of sarcoid, obesity, sleep apnea, hx of DVT...  Hx of ASTHMATIC BRONCHITIS, ACUTE (ICD-466.0) - she was dx w/ reflux related airway dis by DrKozlow in 2009...  PULMONARY EMBOLISM (ICD-415.19) 8/11 and subsequent DEEP VENOUS THROMBOPHLEBITIS, LEG, RIGHT (ICD-453.40) ** SEE ABOVE ** now on ARIXTRA 7.5mg  Sq daily & followed by DrEnnever. Hx of DVT (ICD-453.40) - prev on Coumadin & off since cerebellar bleed in 2006... NOTE: she had neg venous dopplers 8/11 adm for PE. VENOUS INSUFFICIENCY (ICD-459.81) - she has VV/ VI and follows a low sodium diet, elevation, support hose, etc...  HYPERTENSION (ICD-401.9) - controlled on METOPROLOL 25mg /d... BP= 130/82 today- denies HA, visual changes, CP, palipit, dizziness, syncope, etc...   CORONARY ARTERY DISEASE (ICD-414.00) - adenosine cardiolite 8/04 showed distal ant & apical infarct w/ mild ischemia & EF=33%;  cardiac cath 9/04 showed min non-obstructive CAD & LVD w/ EF=46%, global HK, 2+MR, PA press 50/24 w/  wedge 20;  TEE 4/05 - see above... Hosp 12-Aug-2023 w/ CP/ SOB/ fatigue- cath showed norm Lmain, 30-40% LAD, 30% ostial CIRC, norm RCA, ant wall HK w/ EF=40%... she had LBBB, high grade AV block, pacer inserted...  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 Hosp 2023-08-12 w/ high grade block- pacer placed and pt feeling better...  ~  last seen in office 9/10 by DrTaylor> gets remote device checks> ?Cards eval during flurry of hospitalizations August 11, 2010?  HYPERLIPIDEMIA (ICD-272.4) - prev on diet alone, Lipitor added 9/09 by DrMcAlhaney but INTOL, changed to PRAVASTATIN 40mg /d...  ~  FLP 8/07 on diet showed TChol 190, TG 131, HDL 31, LDL 133... did not want med Rx.  ~  FLP 08-12-2023 in [place] showed TChol 182, TG 143, HDL 28, LDL 125... Lipitor Rx started.  ~  FLP 12/09 on Lip10 showed TChol 154, TG 117, HDL 37, LDL  93... DrTaylor changed to Prav40 due to muscle aches.  ~  FLP 7/10 on Prav40 showed TChol 167, Tg 140, HDL 39, LDL 100  ~  FLP 9/11 showed TChol 143, TG 100, HDL 43, LDL 80  OBESITY (ICD-278.00) - peak weight of 226# in 2009... discussed diet + exercise.  ~  weight 1/10 = 215#  ~  weight 7/10 = 216#  ~  weight 1/11 = 215#... we reviewed diet + exercise program.  ~  weight 8/11 = 200#  ~  weight 9/11 = 209# .[name] Kitchen. she has incr edema after he back to back hosp adms.  ~  weight 11/11 = 196#  Hx of PUD (ICD-533.90) - on OMEPRAZOLE 40mg  Bid...   HEMORRHOIDS (ICD-455.6) - last colonoscopy 11/01 by DrStark was neg x hems; f/u 10 yrs.  RENAL INSUFFICIENCY (ICD-588.9), & UTI (ICD-599.0) - see 8/11 hosp w/ Creat= 4.2 on adm... improved to 1.14 by disch w/ fluid management.  ~  labs 8/11 post disch showed BUN= 15, Creat= 1.1 (off diuretics etc).  ~  labs 9/11 in hosp showed BUN= 11, Creat= 1.1 (back on diuretics for edema).  DEGENERATIVE JOINT DISEASE (ICD-715.90) - c/o neck & shoulder pain- saw DrRowan w/ XRays of  neck= "6 spurs" per pt & he rec PT which she says didn't help... therefore went to chiropractor w/ some benefit but short lived betw appts... can't do MRI due to pacer... noticed knot at clavicular head near sternum- tender to palp, no known trauma... XRay & CTscan showed severe degen dis... Rx ETODOLAC.  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  ~  Neurology reiterated her absolute contraindication to coumadin Rx during 8/11 hosp w/ PE/ DVT...  R/O OTHER CONVULSIONS (ICD-780.39) - eval by DrWeyman & refer to [facility] for unusual episodes of "fear & gait instability w/ fall">> neg ambulatory EEG from DrDeToledo in the Epilepsy Monitoring Unit... 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 she notes occas headaches behind her left ear and in the right temporal area... Tylenol usually helps these pains...  ANXIETY (ICD-300.00) - on Clorazepate 7.5mg  Tid...   Allergies (verified): 1)  ! Sulfa 2)  ! Imitrex  Comments:  Nurse/Medical Assistant: The patient's medications and allergies were reviewed with the patient and were updated in the Medication and Allergy Lists. [name] CNA/MA  October 18, 2010 3:13 PM   Past History:  Past Medical History: ALLERGIC RHINITIS (ICD-477.9) OBSTRUCTIVE SLEEP APNEA (ICD-327.23) SARCOIDOSIS (ICD-135) PULMONARY HYPERTENSION (ICD-416.8) Hx of ASTHMATIC BRONCHITIS, ACUTE (ICD-466.0) PULMONARY EMBOLISM (ICD-415.19) DEEP VENOUS THROMBOPHLEBITIS, LEG, RIGHT (ICD-453.40) HYPERTENSION (ICD-401.9) CORONARY ARTERY DISEASE (ICD-414.00) Hx of SYNCOPE (ICD-780.2) CARDIAC PACEMAKER IN SITU (ICD-V45.01) VENOUS INSUFFICIENCY (ICD-459.81) HYPERLIPIDEMIA (ICD-272.4) OBESITY (ICD-278.00) Hx of PUD (ICD-533.90) HEMORRHOIDS (ICD-455.6) RENAL INSUFFICIENCY  (ICD-588.9) UTI (ICD-599.0) DEGENERATIVE JOINT DISEASE (ICD-715.90) CERVICALGIA (ICD-723.1) BACK PAIN, LUMBAR (ICD-724.2) Hx of STROKE (ICD-434.91) R/O OTHER CONVULSIONS (ICD-780.39) ANXIETY (ICD-300.00)  Past Surgical History: S/P Appendectomy S/P Cholecystectomy by DrBlievernicht 2/97 S/P Hysterectomy S/P Lumbar Disc Surgery in 1990 S/P Lft Knee Arthroscopy S/P IVC Filter placed 8/11  Family History: Reviewed history from 08/17/2010 and no changes required. Father died age 13 with MI Mother is 84 and in good health 2 siblings: 1 sister died of cancer at age 79 1 brother died of MI at age 36  Social  History: Reviewed history from 08/17/2010 and no changes required. Widow 4 children retired non-smoker no alcohol  Review of Systems      See HPI       The patient complains of dyspnea on exertion and peripheral edema.  The patient denies anorexia, fever, weight loss, weight gain, vision loss, decreased hearing, hoarseness, chest pain, syncope, prolonged cough, headaches, hemoptysis, abdominal pain, melena, hematochezia, severe indigestion/heartburn, hematuria, incontinence, muscle weakness, suspicious skin lesions, transient blindness, difficulty walking, depression, unusual weight change, abnormal bleeding, enlarged lymph nodes, and angioedema.         She is much improved overall since her last sreies of hospitalizations...   Vital Signs:  Patient profile:   75 year old female Height:      66 inches Weight:      195.38 pounds BMI:     31.65 O2 Sat:      94 % on Room air Temp:     98.0 degrees F oral Pulse rate:   100 / minute BP sitting:   130 / 82  (left arm) Cuff size:   regular  Vitals Entered By: [name] CNA/MA (October 18, 2010 3:12 PM)  O2 Flow:  Room air  Physical Exam  Additional Exam:  WD, Overweight, 75 y/o WF in NAD... she states that she is feeling better. GENERAL:  Alert & oriented; pleasant & cooperative... HEENT:  [place]/AT, EOM-wnl,  PERRLA, EACs-clear, TMs-wnl, NOSE- dry MM, THROAT-clear & wnl. NECK:  Supple w/ fair ROM; no JVD; normal carotid impulses w/o bruits; no thyromegaly or nodules palpated; no lymphadenopathy palpated -  scar of prev supraclav LN bx... prominent right clavicular head... CHEST:  Clear to P & A; without wheezes/ rales/ or rhonchi heard... HEART:  Regular rhythm, no murmurs/ rubs/ gallops detected... ABDOMEN:  Obese, soft & nontender; normal bowel sounds; no organomegaly or masses palpated... EXT: without deformities, mild arthritic changes; + ven insuffic and 1+edema- much improved... NEURO:  CN's intact; no focal neuro deficits... DERM:  No lesions noted; no rash etc...    Impression & Recommendations:  Problem # 1:  DEEP VENOUS THROMBOPHLEBITIS, LEG, RIGHT (ICD-453.40) She had PE, DVT- recurrent, has IVC filter from 8/11>  now cared for by Drennever & on Arixtra 7.5mg  Sq daily... he plans consult [facility] clotting disorders clinic- pending 11/15/10... cont ine Rx & f/u Drennever.  Problem # 2:  OBSTRUCTIVE SLEEP APNEA (ICD-327.23) She has seen DrClance & wears a dental appliance from DrEssick [facility]...  Problem # 3:  SARCOIDOSIS (ICD-135) Remains on sm dose Pred 2.5mg  Qod for now.... we discussed re=assessment after 1st of the yr w/ CXR, ACE, etc & consideration of getting off the Pred...  Problem # 4:  PULMONARY HYPERTENSION (ICD-416.8) Multifactorial as noted & clinically improved w/ mult therapeutic modalities...  Problem # 5:  HYPERTENSION (ICD-401.9) Controlled>  same meds. Her updated medication list for this problem includes:    Metoprolol Succinate 25 Mg Xr24h-tab (Metoprolol succinate) .[name] Kitchen... Take 1 tablet by mouth once a day    Furosemide 20 Mg Tabs (Furosemide) .[name] Kitchen... Take 2 tablets by mouth once a day  Problem # 6:  CORONARY ARTERY DISEASE (ICD-414.00) She has CAD, hx syncope- pacer, hx CHF > all followed by DrTaylor for Cards & she is due for ROV... Her updated medication list  for this problem includes:    Metoprolol Succinate 25 Mg Xr24h-tab (Metoprolol succinate) .[name] Kitchen... Take 1 tablet by mouth once a day    Furosemide 20 Mg Tabs (Furosemide) .[name] KitchenMarland KitchenMarland KitchenMarland Kitchen  Take 2 tablets by mouth once a day  Problem # 7:  HYPERLIPIDEMIA (ICD-272.4) Stable on Prav40... Her updated medication list for this problem includes:    Pravastatin Sodium 40 Mg Tabs (Pravastatin sodium) .[name] Kitchen... Take 1 tablet by mouth once a day  Problem # 8:  OTHER MEDICAL PROBLEMS AS NOTED>>>  Complete Medication List: 1)  Arixtra 7.5 Mg/0.30ml Soln (Fondaparinux sodium) .[name] Kitchen.. 1 injection daily 2)  Nasonex 50 Mcg/act Susp (Mometasone furoate) .... 2 sp in each nostril at bedtime.[name] KitchenMarland Kitchen 3)  Zyrtec Allergy 10 Mg Tabs (Cetirizine hcl) .[name] Kitchen.. 1 by mouth once daily 4)  Duoneb 0.5-2.5 (3) Mg/71ml Soln (Ipratropium-albuterol) .... Inhale 1 vial in neb machine two times a day 5)  Flovent Hfa 44 Mcg/act Aero (Fluticasone propionate  hfa) .... 2 puffs two times a day... 6)  Prednisone 5 Mg Tabs (Prednisone) .... 1/2 tab by mouth every other day as directed 7)  Metoprolol Succinate 25 Mg Xr24h-tab (Metoprolol succinate) .... Take 1 tablet by mouth once a day 8)  Furosemide 20 Mg Tabs (Furosemide) .... Take 2 tablets by mouth once a day 9)  Pravastatin Sodium 40 Mg Tabs (Pravastatin sodium) .... Take 1 tablet by mouth once a day 10)  Omeprazole 40 Mg Cpdr (Omeprazole) .... Take 1 tablet by mouth two times a day 11)  Senokot 8.6 Mg Tabs (Sennosides) .... 2 tabs by mouth at bedtime 12)  Centrum Tabs (Multiple vitamins-minerals) .[name] Kitchen.. 1 by mouth once daily 13)  Protegra Caps (Multiple vitamins-minerals) .[name] Kitchen.. 1 by mouth once daily 14)  Vitamin D-[address] [phone] Mg-unit Tabs (Calcium-vitamin d) .... Once daily 15)  Folic Acid 1 Mg Tabs (Folic acid) .... Take 1 tablet by mouth once a day 16)  Clorazepate Dipotassium 7.5 Mg Tabs (Clorazepate dipotassium) .... Take 1 tab by mouth three times a day as needed for nerves... 17)  Systane  0.4-0.3 % Soln (Polyethyl glycol-propyl glycol) .... Place 2 drops in both eyes once daily  Patient Instructions: 1)  Today we updated your med list- see below.... 2)  We decided to continue your current meds the same for now... 3)  Let's plan a follow up visit in 2-3 months w/ CXR & FASTING blood work at that time.[name] KitchenMarland Kitchen 4)  Call for any problems... Prescriptions: DUONEB 0.5-2.5 (3) MG/3ML SOLN (IPRATROPIUM-ALBUTEROL) inhale 1 vial in neb machine two times a day  #180 x 3   Entered by:   [name] CNA/MA   Authorized by:   Michele Mcalpine MD   Signed by:   [name] CNA/MA on 10/18/2010   Method used:   Print then Give to Patient   RxID:   [id number] CLORAZEPATE DIPOTASSIUM 7.5 MG  TABS (CLORAZEPATE DIPOTASSIUM) take 1 tab by mouth three times a day as needed for nerves...  #270 x 3   Entered by:   [name] CNA/MA   Authorized by:   Michele Mcalpine MD   Signed by:   [name] CNA/MA on 10/18/2010   Method used:   Print then Give to Patient   RxID:   [id number] OMEPRAZOLE 40 MG CPDR (OMEPRAZOLE) Take 1 tablet by mouth two times a day  #180 x 3   Entered by:   [name] CNA/MA   Authorized by:   Michele Mcalpine MD   Signed by:   [name] CNA/MA on 10/18/2010   Method used:   Print then Give to Patient   RxID:   [id number] PRAVASTATIN SODIUM 40 MG TABS (PRAVASTATIN  SODIUM) Take 1 tablet by mouth once a day  #90 x 3   Entered by:   [name] CNA/MA   Authorized by:   Michele Mcalpine MD   Signed by:   [name] CNA/MA on 10/18/2010   Method used:   Print then Give to Patient   RxID:   [id number] FLOVENT HFA 44 MCG/ACT AERO (FLUTICASONE PROPIONATE  HFA) 2 puffs two times a day...  #3 x 3   Entered by:   [name] CNA/MA   Authorized by:   Michele Mcalpine MD   Signed by:   [name] CNA/MA on 10/18/2010   Method used:   Print then Give to Patient   RxID:   [id number] NASONEX 50 MCG/ACT SUSP (MOMETASONE FUROATE) 2 sp in each  nostril at bedtime...  #3 x 3   Entered by:   [name] CNA/MA   Authorized by:   Michele Mcalpine MD   Signed by:   [name] CNA/MA on 10/18/2010   Method used:   Print then Give to Patient   RxID:   [id number]

## 2011-01-17 NOTE — Letter (Signed)
Summary: Remote Device Check  [employer], Main Office  [address]   [place], [place] [address]   Phone: [phone]  Fax: [phone]     June 28, 2010 MRN: [id number]   [facility] [address] # [place], [place]  [address]   Dear [name] [name],   Your remote transmission was recieved and reviewed by your physician.  All diagnostics were within normal limits for you.  __X____Your next office visit is scheduled for:  September 2011 with Dr Ladona Ridgel. Please call our office to schedule an appointment.    Sincerely,  Vella Kohler

## 2011-01-17 NOTE — Assessment & Plan Note (Signed)
Summary: 1 MONTH FOLLOW UP/LA   Primary Care Provider:  Kriste Basque  CC:  Post hosp visit & review....  History of Present Illness: 75 y/o WF followed for mult medical problems...   ~  Aug09:  Hospitalized by Cardiology w/ high grade block & cath showing non-obstructive CAD, HBP & decr LVF= 40%... Med Rx per [facility] & DrTaylor placed pacemaker... he added Lipitor for her Chol but she doesn't like this med "I can't function" muscle aches & he switched her to Pravachol 12/09 (tolerating better)... her BP has been under fair control w/ METOPROLOL 25mg /d, DIOVAN 160mg /d, and LASIX 20mg - 1-2tabs/d & K20/d...   ~  Jul10:  states that she is doing OK... wants refills for 90day supplies... she is c/o "sleepy" & has been taking Soma... Obese w/ weight 216#, had sleep study 2004 w/ RDI= 16 & O2 desat to 78%... also had large # leg jerks but without sleep disruption... we discussed poss repeat sleep study, appt w/ DrClance...   ~  January 03, 2010:  repeat sleep study 8/10 showed mild OSA w/ AHI=15 & desat to 83%... she refused CPAP & opted for the dental appliance- went to [facility] DrEssick w/ mandibular advancement appliance made & she really likes it!!!  Also saw DrTaylor 9/10 w/ pacer functioning well- & BP reasonably well controlled on meds... no new complaints or concerns today... we will check CXR, but she prefers to wait until return OV for her fasting blood work...   ~  August 17, 2010:  in early Aug she developed sudden SOB- she thought it was her heart & saw DrTaylor but pacer was OK... went to ER w/ eval revealing bilat PE by V/Q (couldn't do CTA due to Creat=4.2), also had DDimer>20, BNP=1400, Troponin= 0.35, note- ven dopplers were neg for DVT... hx cerebellar hemorrhage (while on Coumadin in past) & Neuro consult said absolute contraindication for Coumadin Rx, therefore had IVC filter placed & transient IV heparin... Fluid management resulted in improved renal function & BNP (Creat=1.14 at end of  hosp, & BNP=400)... resp management w/ NEBS etc... she was tx to rehab & disch home 08/06/10> since then c/o weak- getting home PT, and nocturnal O2 qualified... also notes low grade temps to 100+ esp at night- sl dry cough, no sputum, sore throat+mouth, +DOE, & denies GI or GU symptoms... notes occas epistaxis related to the nasal O2... pt ambulated on RA w/ resting HR=100 & Sat=96%> one lap w/ HR=130 & Sat=95%...   **we decided to check CXR (no acute change), Labs (all improved), UA (+UTI) & Rx empirically w/ Cipro, Tylenol, Nasal Saline, & MMW.   ~  September 14, 2010:  since I saw her last she has been adm x2 w/ DVT- hosp records reviewed in detail: Adm 9/12-15 & disch on Lovenox once daily but had recurrent leg swelling & pain w/ VenDopplers showing a new DVT & Adm 9/21-25 where Lovenox was incr to Bid... now taking Lovenox 80mg Bid- hypercoag w/u showed sl incr homocystine & DrEnnever rec Folic 1mg /d.... she has severe periph edema & wt up 9# in 47mo> we will incr Lasix to 40mg Qam... she is feeling much better, more mobile, & has a great attitude.   Problem List:  ALLERGIC RHINITIS (ICD-477.9) - she uses NASONEX & ZYRTEK per DrKozlow...  OBSTRUCTIVE SLEEP APNEA (ICD-327.23) - she is followed by DrClance...  ~  sleep study 2004 showed RDI= 16, desat to 78%, PLMS= 248 w/o sleep disruption... pt opted for diet, weight reduction...  ~  repeat sleep study 8/10 showed mild OSA w/ AHI=15 & desat to 83%... she refused CPAP & opted for the dental appliance- went to [facility] DrEssick w/ mandibular advancement appliance made & she is very happy.  ~  f/u DrClance 7/11 w/ persistant daytime hypersomnolence- he considered repeat sleep study.  SARCOIDOSIS (ICD-135) - Eval for dyspnea revealed evid for pulmonary hypertension and CTChest 4/08 showed +lymphadenopathy... she had a supraclavicular node biopsy 5/08 by DrStreck that revealed extensive granulomatous inflammation... ACE level= 65, sed= 35, ANA= neg, CBC/  chems= normal... PREDNISONE therapy started  & slowly weaned>>> now at 5mg - 1/2 tab Qod.  ~  PFT's 12/08 showed FVC=2.62 84%), FEV1=1.74 (71%), %1sec=67, mid-flows=33%pred...  ~  CXR 9/09 showed stable chr interstitial markings, no adenopathy, scarring at the bases, pacer, NAD...  ~  labs 9/09 showed ACE= 47... rec decr Pred5  to 1/2 tab Qod...  ~  CT Chest 4/10 showed calcif hilar & mediast nodes- old gran dis, bilat scarring is stable (no active lung dis), asymmetric osteoarth in sternoclav joint...  ~  CXR 1/11 showed stable scarring & pacer, NAD.[name] Kitchen.  ~  CXR 8/11 showed similar w/ borderline heart size, pacer, prom PA, & scarring...  PULMONARY HYPERTENSION (ICD-416.8) -  Eval by Cardiology & DrClance 2004- he noted low PVR readings, eventually had sleep study done- see above;  TEE 4/05 showed norm LVF w/ EF=55-65%, no regional wall motion abn, mild MR, mod TR & incr peak pulm sys pressure & norm RV;  etiology believed to be poss combination of sarcoid, obesity, sleep apnea, hx of DVT...  Hx of ASTHMATIC BRONCHITIS, ACUTE (ICD-466.0) - she was dx w/ reflux related airway dis by DrKozlow in 2009...  PULMONARY EMBOLISM (ICD-415.19) 8/11 and subsequent DEEP VENOUS THROMBOPHLEBITIS, LEG, RIGHT (ICD-453.40) ** SEE ABOVE ** Hx of DVT (ICD-453.40) - prev on Coumadin & off since cerebellar bleed in 2006... NOTE: she had neg venous dopplers 8/11 adm for PE. VENOUS INSUFFICIENCY (ICD-459.81) - she has VV/ VI and follows a low sodium diet, elevation, support hose, etc...  HYPERTENSION (ICD-401.9) - controlled on METOPROLOL 25mg /d... BP= 132/82 today- denies HA, visual changes, CP, palipit, dizziness, syncope, edema, etc...   CORONARY ARTERY DISEASE (ICD-414.00) - adenosine cardiolite 8/04 showed distal ant & apical infarct w/ mild ischemia & EF=33%;  cardiac cath 9/04 showed min non-obstructive CAD & LVD w/ EF=46%, global HK, 2+MR, PA press 50/24 w/ wedge 20;  TEE 4/05 - see above... Hosp 2023/08/09 w/ CP/ SOB/  fatigue- cath showed norm Lmain, 30-40% LAD, 30% ostial CIRC, norm RCA, ant wall HK w/ EF=40%... she had LBBB, high grade AV block, pacer inserted...  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 Hosp 08-09-23 w/ high grade block- pacer placed and pt feeling better...  HYPERLIPIDEMIA (ICD-272.4) - prev on diet alone, Lipitor added 9/09 by DrMcAlhaney but INTOL, changed to PRAVASTATIN 40mg /d...  ~  FLP 8/07 on diet showed TChol 190, TG 131, HDL 31, LDL 133... did not want med Rx.  ~  FLP 09-Aug-2023 in [place] showed TChol 182, TG 143, HDL 28, LDL 125... Lipitor Rx started.  ~  FLP 12/09 on Lip10 showed TChol 154, TG 117, HDL 37, LDL 93... DrTaylor changed to Prav40 due to muscle aches.  ~  FLP 7/10 on Prav40 showed TChol 167, Tg 140, HDL 39, LDL 100  ~  FLP 9/11 showed TChol 143, TG 100, HDL  43, LDL 80  OBESITY (ICD-278.00) - peak weight of 226# in 2009... discussed diet + exercise.  ~  weight 1/10 = 215#  ~  weight 7/10 = 216#  ~  weight 1/11 = 215#... we reviewed diet + exercise program.  ~  weight 8/11 = 200#  ~  weight 9/11 = 209# .[name] Kitchen. she has incr edema after he back to back hosp adms.  Hx of PUD (ICD-533.90) - on OMEPRAZOLE 40mg  Bid...   HEMORRHOIDS (ICD-455.6) - last colonoscopy 11/01 by DrStark was neg x hems; f/u 10 yrs.  RENAL INSUFFICIENCY (ICD-588.9), & UTI (ICD-599.0) - see 8/11 hosp w/ Creat= 4.2 on adm... improved to 1.14 by disch w/ fluid management.  ~  labs 8/11 post disch showed BUN= 15, Creat= 1.1 (off diuretics etc).  ~  labs 9/11 in hosp showed BUN= 11, Creat= 1.1 (back on diuretics for edema).  DEGENERATIVE JOINT DISEASE (ICD-715.90) - c/o neck & shoulder pain- saw DrRowan w/ XRays of neck= "6 spurs" per pt & he rec PT which she says didn't help... therefore went to chiropractor w/ some benefit but short lived betw appts... can't do MRI due to pacer... noticed knot at clavicular head near  sternum- tender to palp, no known trauma... XRay & CTscan showed severe degen dis... Rx ETODOLAC.  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  R/O OTHER CONVULSIONS (ICD-780.39) - eval by DrWeyman & [facility] to [facility] for unusual episodes of "fear & gait instability w/ fall">> neg ambulatory EEG from DrDeToledo in the Epilepsy Monitoring Unit... 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 she notes occas headaches behind her left ear and in the right temporal area... Tylenol usually helps these pains...  ANXIETY (ICD-300.00) - on Clorazepate 7.5mg  Tid...   Preventive Screening-Counseling & Management  Alcohol-Tobacco     Alcohol drinks/day: 0     Smoking Status: never  Allergies: 1)  ! Sulfa 2)  ! 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) HYPERTENSION (ICD-401.9) CORONARY ARTERY DISEASE (ICD-414.00) Hx of SYNCOPE (ICD-780.2) CARDIAC PACEMAKER IN SITU (ICD-V45.01) VENOUS INSUFFICIENCY (ICD-459.81) HYPERLIPIDEMIA (ICD-272.4) OBESITY (ICD-278.00) Hx of PUD (ICD-533.90) HEMORRHOIDS (ICD-455.6) RENAL INSUFFICIENCY (ICD-588.9) UTI (ICD-599.0) DEGENERATIVE JOINT DISEASE (ICD-715.90) CERVICALGIA (ICD-723.1) BACK PAIN, LUMBAR (ICD-724.2) Hx of STROKE (ICD-434.91) R/O OTHER CONVULSIONS (ICD-780.39) ANXIETY (ICD-300.00)  Family History: Reviewed history from 08/17/2010 and no changes required. Father died age 61 with MI Mother is 41 and in good health 2 siblings: 1 sister died of cancer at age 39 1 brother died of MI  at age 58  Social History: Reviewed history from 08/17/2010 and no changes required. Widow 4 children retired non-smoker no alcohol  Review of Systems      See HPI       The patient complains of weight gain, dyspnea on exertion, peripheral edema, and difficulty walking.  The patient denies anorexia, fever, weight loss, vision loss, decreased hearing, hoarseness, chest pain, syncope, prolonged cough, headaches, hemoptysis, abdominal pain, melena, hematochezia, severe indigestion/heartburn, hematuria, incontinence, muscle weakness, suspicious skin lesions, transient blindness, depression, unusual weight change, abnormal bleeding, enlarged lymph nodes, and angioedema.    Vital Signs:  Patient profile:  75 year old female Height:      66 inches Weight:      209 pounds BMI:     33.86 O2 Sat:      94 % on Room air Temp:     98.4 degrees F oral Pulse rate:   95 / minute BP sitting:   132 / 82  (left arm) Cuff size:   regular  Vitals Entered By: Randell Loop CMA (September 14, 2010 2:49 PM)  O2 Sat at Rest %:  94 O2 Flow:  Room air CC: Post hosp visit & review... Is Patient Diabetic? No Pain Assessment Patient in pain? no      Comments meds updated with list from hospital discharge   Physical Exam  Additional Exam:  WD, Overweight, 75 y/o WF in NAD... she states that she is feeling better. GENERAL:  Alert & oriented; pleasant & cooperative... HEENT:  [place]/AT, EOM-wnl, PERRLA, EACs-clear, TMs-wnl, NOSE- dry MM, THROAT-clear & wnl. NECK:  Supple w/ fair ROM; no JVD; normal carotid impulses w/o bruits; no thyromegaly or nodules palpated; no lymphadenopathy palpated -  scar of prev supraclav LN bx... prominent right clavicular head... CHEST:  Clear to P & A; without wheezes/ rales/ or rhonchi heard... HEART:  Regular rhythm, no murmurs/ rubs/ gallops detected... ABDOMEN:  Obese, soft & nontender; normal bowel sounds; no organomegaly or masses palpated... EXT: without deformities,  mild arthritic changes; + ven insuffic and 3-4+ bilat leg edema/ swelling up to the hips... NEURO:  CN's intact; no focal neuro deficits... DERM:  No lesions noted; no rash etc...    MISC. Report  Procedure date:  09/14/2010  Findings:      DATA REVIEWED:  ~  prev EMR notes...  ~  Hosp adm 9/12-15/11 data...  ~  [facility] 9/21-25/11 H&P, DCSummary, Consult note, XRays, Labs...    Impression & Recommendations:  Problem # 1:  DEEP VENOUS THROMBOPHLEBITIS, LEG, RIGHT (ICD-453.40) DVT 9/11 & PTE 8/11>  not a coumadin candidate due to prev ICH while on Coumadin for DVT in the past, therefore treated w/ IVC filter & subseq LOVENOX shots>  adjusted to 80mg  Bid now & improved... ? duration of therapy- we will plan repeat VenDopplers in 3months...  Problem # 2:  OBSTRUCTIVE SLEEP APNEA (ICD-327.23) Followed by DrClance- she opted for dental appliance last yr...  Problem # 3:  SARCOIDOSIS (ICD-135) She has underlying Sarcoid & PulmHTN that is multifactorial... still on Pred 5mg - 1/2 Qod...  Problem # 4:  HYPERTENSION (ICD-401.9) BP controlled>  same meds. Her updated medication list for this problem includes:    Metoprolol Succinate 25 Mg Xr24h-tab (Metoprolol succinate) .[name] Kitchen... Take 1 tablet by mouth once a day    Furosemide 20 Mg Tabs (Furosemide) .[name] Kitchen... Take 2 tablets by mouth once a day  Problem # 5:  CORONARY ARTERY DISEASE (ICD-414.00) She has non-obstructive dis & stable on meds... The following medications were removed from the medication list:    Adult Aspirin Ec Low Strength 81 Mg Tbec (Aspirin) .[name] Kitchen... 1 by mouth once daily Her updated medication list for this problem includes:    Metoprolol Succinate 25 Mg Xr24h-tab (Metoprolol succinate) .[name] Kitchen... Take 1 tablet by mouth once a day    Furosemide 20 Mg Tabs (Furosemide) .[name] Kitchen... Take 2 tablets by mouth once a day  Problem # 6:  CARDIAC PACEMAKER IN SITU (ICD-V45.01) Pacer placed 2009 for hi grade AVblock & stable... followed by  DrTaylor.  Problem # 7:  HYPERLIPIDEMIA (ICD-272.4) FLP looks good on  Prav40... Her updated medication list for this problem includes:    Pravastatin Sodium 40 Mg Tabs (Pravastatin sodium) .[name] Kitchen... Take 1 tablet by mouth once a day  Problem # 8:  OTHER MEDICAL PROBLEMS AS NOTED>>> Lasix incr to 40mg /d due to edema...  Complete Medication List: 1)  Lovenox 80 Mg/0.16ml Soln (Enoxaparin sodium) .... Use 80mg  two times a day 2)  Nasonex 50 Mcg/act Susp (Mometasone furoate) .... 2 sp in each nostril at bedtime.[name] KitchenMarland Kitchen 3)  Zyrtec Allergy 10 Mg Tabs (Cetirizine hcl) .[name] Kitchen.. 1 by mouth once daily 4)  Nebulizer W/ Albuterol & Ipratropium  .... Use in neb 4 times daily.[name] KitchenMarland Kitchen 5)  Flovent Hfa 44 Mcg/act Aero (Fluticasone propionate  hfa) .... 2 puffs two times a day... 6)  Prednisone 5 Mg Tabs (Prednisone) .... 1/2 tab by mouth every other day as directed 7)  Metoprolol Succinate 25 Mg Xr24h-tab (Metoprolol succinate) .... Take 1 tablet by mouth once a day 8)  Furosemide 20 Mg Tabs (Furosemide) .... Take 2 tablets by mouth once a day 9)  Pravastatin Sodium 40 Mg Tabs (Pravastatin sodium) .... Take 1 tablet by mouth once a day 10)  Omeprazole 40 Mg Cpdr (Omeprazole) .... Take 1 tablet by mouth two times a day 11)  Centrum Tabs (Multiple vitamins-minerals) .[name] Kitchen.. 1 by mouth once daily 12)  Protegra Caps (Multiple vitamins-minerals) .[name] Kitchen.. 1 by mouth once daily 13)  Vitamin D-[address] [phone] Mg-unit Tabs (Calcium-vitamin d) .... Once daily 14)  Clorazepate Dipotassium 7.5 Mg Tabs (Clorazepate dipotassium) .... Take 1 tab by mouth three times a day as needed for nerves... 15)  Systane 0.4-0.3 % Soln (Polyethyl glycol-propyl glycol) .... Place 2 drops in both eyes once daily 16)  Tylenol 325 Mg Tabs (Acetaminophen) .... As needed for pain 17)  Folic Acid 1 Mg Tabs (Folic acid) .... Take 1 tablet by mouth once a day  Patient Instructions: 1)  Today we updated your med list- see below.... 2)  We [facility] your [facility]  & filled Rx for LOVENOX 80mg Bid, Folic Acid 1mg /d, Lasix 40mg /d.[name] KitchenMarland Kitchen 3)  Remember to eliminate salt/ sodium, elevate legs when able, wear support hose.[name] KitchenMarland Kitchen 4)  Call for any problems.[name] KitchenMarland Kitchen 5)  Please schedule a follow-up appointment in 1 month.

## 2011-01-17 NOTE — Letter (Signed)
Summary: Remote Device Check  [employer], Main Office  [address]   [place], [place] [address]   Phone: [phone]  Fax: [phone]     December 21, 2009 MRN: [id number]   [facility] [address] # [place], [place]  [address]   Dear Ms. Hayworth,   Your remote transmission was recieved and reviewed by your physician.  All diagnostics were within normal limits for you.  __X___Your next transmission is scheduled for:    March 09, 2010.  Please transmit at any time this day.  If you have a wireless device your transmission will be sent automatically.     Sincerely,  Proofreader

## 2011-01-17 NOTE — Progress Notes (Signed)
Summary: HFU  Phone Note Call from Patient Call back at Home Phone [phone]   Caller: Patient Call For: NADEL Summary of Call: pt was just d/c'd from hospital. told to f/u w/ sn in 3 wks. pt already has an appt scheduled for 10/18/10 at 3:00pm. if this needs to be changed call pt at home # above. note: this appt was already made by la- but it's a 15 min appt.  Initial call taken by: Tivis Ringer, CNA,  September 27, 2010 4:47 PM  Follow-up for Phone Call        all of SN appts are 30 mins.  we will just keep this appt on 11-1 with the pt. no need for call back since we are not changing the appt  Randell Loop CMA  September 28, 2010 10:48 AM

## 2011-01-17 NOTE — Progress Notes (Signed)
Summary: PRESRIPT  Phone Note Call from Patient   Caller: Patient Call For: Fawnda Vitullo Summary of Call: PT NEED CLORAZEPATE REFILL SENT TO [facility] Initial call taken by: Rickard Patience,  March 08, 2010 2:54 PM  Follow-up for Phone Call        rx has been faxed to [facility] per pts request. Randell Loop CMA  March 08, 2010 3:51 PM     Prescriptions: CLORAZEPATE DIPOTASSIUM 7.5 MG  TABS (CLORAZEPATE DIPOTASSIUM) take 1 tab by mouth three times a day as needed for nerves...  #270 x 4   Entered by:   Randell Loop CMA   Authorized by:   Michele Mcalpine MD   Signed by:   Randell Loop CMA on 03/08/2010   Method used:   Historical   RxID:   [id number]   Appended Document: PRESRIPT  rx has been printed off---the clorazepate was put in as historical and i should have printed this out.  once this has been signed by SN i will fax this to [facility] per pts request. Randell Loop CMA  March 08, 2010 3:53 PM   Clinical Lists Changes  Medications: Rx of CLORAZEPATE DIPOTASSIUM 7.5 MG  TABS (CLORAZEPATE DIPOTASSIUM) take 1 tab by mouth three times a day as needed for nerves...;  #270 x 4;  Signed;  Entered by: Randell Loop CMA;  Authorized by: Michele Mcalpine MD;  Method used: Print then Give to Patient    Prescriptions: CLORAZEPATE DIPOTASSIUM 7.5 MG  TABS (CLORAZEPATE DIPOTASSIUM) take 1 tab by mouth three times a day as needed for nerves...  #270 x 4   Entered by:   Randell Loop CMA   Authorized by:   Michele Mcalpine MD   Signed by:   Randell Loop CMA on 03/08/2010   Method used:   Print then Give to Patient   RxID:   [id number]

## 2011-01-17 NOTE — Procedures (Signed)
Summary: Pulse Oximetry/IDS  Pulse Oximetry/IDS   Imported By: Sherian Rein 08/23/2010 14:08:37  _____________________________________________________________________  External Attachment:    Type:   Image     Comment:   External Document

## 2011-01-17 NOTE — Progress Notes (Signed)
Summary: appointment for post hosp f/u   Phone Note Call from Patient Call back at Home Phone [phone]   Caller: Patient Call For: nadel Summary of Call: Pt states she was released from the hospital on 8/20 and needs a 2 week f/u with SN, pls advise. Initial call taken by: Darletta Moll,  August 08, 2010 9:46 AM  Follow-up for Phone Call        Marliss Czar, please advise of an appt date and time for pt's post hosp f/u appt.  thanks. Arman Filter LPN  August 08, 2010 9:49 AM    ok to add pt on for 8-31 at 3pm.  thanks Randell Loop [facility]  August 08, 2010 11:08 AM   Additional Follow-up for Phone Call Additional follow up Details #1::        called and spoke with pt.  pt aware of appt date and time.  Aundra Millet Reynolds LPN  August 08, 2010 11:12 AM      Appended Document: Orders Update     Clinical Lists Changes  Orders: Added new Referral order of DME Referral (DME) - Signed

## 2011-01-17 NOTE — Progress Notes (Signed)
Summary: sob off and on/rapid heartbeat when walking   Phone Note Call from Patient   Caller: Patient Reason for Call: Talk to Nurse Summary of Call: pt having sob off and on, rapid heartbeat when walking even short distances-pls call [phone] Initial call taken by: Glynda Jaeger,  July 20, 2010 8:49 AM  Follow-up for Phone Call        Spoke with pt. Patient states for the last three days she has been having palpitations and SOB with mild  activity. She also has  been having a constant cough. Pt. states she has been in  bed to seen if the symptoms go away and because she feels better when she is resting. Blood pressure today is 140/111 pulse 87 beats/ min. Pt. has a pacemaker checked 03/09/10. Ollen Gross, RN, BSN  July 20, 2010 9:19 AM Pt. has an appointment with the device clinic for pacemaker check at 12:00 noon. Pt aware. Spoke with [facility]. She is aware.  Follow-up by: Ollen Gross, RN, BSN,  July 20, 2010 10:15 AM  Additional Follow-up for Phone Call Additional follow up Details #1::        Device check  okay per [facility]. Pt's sob is not new. She is waiting to have sleep study this month  per Dr. Camelia Phenes MD. B/p checked in the office 138/97 pulse 84 beats/min. Pt. was able to walk in hallway without problem. I advice pt.to call the office if symptoms returns. Advice to keep F/U appointment with Dr. Ladona Ridgel. Pt. verbalized understanding. Additional Follow-up by: Ollen Gross, RN, BSN,  July 20, 2010 12:48 PM

## 2011-01-17 NOTE — Letter (Signed)
Summary: CMN/HCS  CMN/HCS   Imported By: Lester Afton 08/24/2010 09:25:33  _____________________________________________________________________  External Attachment:    Type:   Image     Comment:   External Document

## 2011-01-17 NOTE — Medication Information (Signed)
Summary: Nebulizer & Meds/[facility]  [facility]   Imported By: Sherian Rein 10/14/2010 08:22:59  _____________________________________________________________________  External Attachment:    Type:   Image     Comment:   External Document

## 2011-01-17 NOTE — Procedures (Signed)
Summary: Order for Overnight Oximetry/IDS  Order for Overnight Oximetry/IDS   Imported By: Sherian Rein 08/17/2010 14:49:18  _____________________________________________________________________  External Attachment:    Type:   Image     Comment:   External Document

## 2011-01-17 NOTE — Miscellaneous (Signed)
   Clinical Lists Changes  received a phone call from nurse for pt, wondering if pt is supposed to be on coumadin.  I pulled up d/c summary which stated she is supposed to be on lovenox, but coumadin was d/ced due to bleeding risk.  she does have an IVC filter in place.

## 2011-01-17 NOTE — Miscellaneous (Signed)
Summary: Certification & Plan of Care / [facility]  Certification & Plan of Care / [facility]   Imported By: Lennie Odor 10/28/2010 10:57:36  _____________________________________________________________________  External Attachment:    Type:   Image     Comment:   External Document

## 2011-01-17 NOTE — Cardiovascular Report (Signed)
Summary: Office Visit   Office Visit   Imported By: Roderic Ovens 11/01/2010 17:01:12  _____________________________________________________________________  External Attachment:    Type:   Image     Comment:   External Document

## 2011-01-17 NOTE — Progress Notes (Signed)
Summary: appt today  Phone Note Call from Patient Call back at Home Phone [phone]   Caller: Patient Call For: Kriste Basque Reason for Call: Talk to Nurse Summary of Call: right leg swollen , weak, unable to walk on.  3x's size of left leg.  Would like to see SN. Initial call taken by: Eugene Gavia,  August 29, 2010 8:32 AM  Follow-up for Phone Call        OV with TP at 4:15 this afternoon. Follow-up by: Vernie Murders,  August 29, 2010 10:18 AM

## 2011-01-17 NOTE — Progress Notes (Signed)
Summary: SLEEP TEST  Phone Note Call from Patient   Caller: Patient Call For: ALVA Summary of Call: PT WANTED SLEEP STUDY CANCELLED HER SLEEP STUDY HAS BEEN CANCELED CALLED AND SPOKE TO DANENE PT IS IN THE HOSP  Initial call taken by: Lacinda Axon,  July 25, 2010 12:32 PM

## 2011-01-17 NOTE — Progress Notes (Signed)
Summary: need to sched f/u with [facility]   ---- Converted from flag ---- ---- 06/11/2010 3:11 PM, Barbaraann Share MD wrote: pt needs ov to f/u her osa ------------------------------  [email or web address].Arman Filter LPN  June 16, 2010 10:19 AM  Phone Note Call from Patient   Caller: Patient Call For: clance Summary of Call: 07/11/2010 @ 2:15pm Initial call taken by: Eugene Gavia,  June 21, 2010 10:20 AM

## 2011-01-17 NOTE — Assessment & Plan Note (Signed)
Summary: post hosp f/u appt/ok per leigh/mg   Primary Care Provider:  Kriste Basque  CC:  7 month ROV & post hospital visit....  History of Present Illness: 75 y/o WF followed for mult medical problems...   ~  Aug09:  Hospitalized by Cardiology w/ high grade block & cath showing non-obstructive CAD, HBP & decr LVF= 40%... Med Rx per [facility] [place] & DrTaylor placed pacemaker... he added Lipitor for her Chol but she doesn't like this med "I can't function" muscle aches & he switched her to Pravachol 12/09 (tolerating better)... her BP has been under fair control w/ METOPROLOL 25mg /d, DIOVAN 160mg /d, and LASIX 20mg - 1-2tabs/d & K20/d...   ~  July 05, 2009:  states that she is doing OK... wants refills for 90day supplies... she is c/o "sleepy" & has been taking Soma... Obese w/ weight 216#, had sleep study 2004 w/ RDI= 16 & O2 desat to 78%... also had large # leg jerks but without sleep disruption... we discussed poss repeat sleep study, appt w/ DrClance...   ~  January 03, 2010:  repeat sleep study 8/10 showed mild OSA w/ AHI=15 & desat to 83%... she refused CPAP & opted for the dental appliance- went to [facility] DrEssick w/ mandibular advancement appliance made & she really likes it!!!  Also saw DrTaylor 9/10 w/ pacer functioning well- & BP reasonably well controlled on meds... no new complaints or concerns today... we will check CXR, but she prefers to wait until return OV for her fasting blood work...   ~  August 17, 2010:  in early Aug she developed sudden SOB- she thought it was her heart & saw DrTaylor but pacer was OK... went to ER w/ eval revealing bilat PE by V/Q (couldn't do CTA due to Creat=4.2), also had DDimer>20, BNP=1400, Troponin= 0.35, note- ven dopplers were neg for DVT... hx cerebellar hemorrhage (while on Coumadin in past) & Neuro consult said absolute contraindication for Coumadin Rx, therefore had IVC filter placed & transient IV heparin... Fluid management resulted in improved renal function  & BNP (Creat=1.14 at end of hosp, & BNP=400)... resp management w/ NEBS etc... she was tx to rehab & disch home 08/06/10> since then c/o weak- getting home PT, and nocturnal O2 qualified... also notes low grade temps to 100+ esp at night- sl dry cough, no sputum, sore throat+mouth, +DOE, & denies GI or GU symptoms... notes occas epistaxis related to the nasal O2... pt ambulated on RA w/ resting HR=100 & Sat=96%> one lap w/ HR=130 & Sat=95%...   **we decided to check CXR (no acute change), Labs (all improved), UA (+UTI) & Rx empirically w/ Cipro, Tylenol, Nasal Saline, & MMW.   Problem List:  ALLERGIC RHINITIS (ICD-477.9) - she uses NASONEX & ZYRTEK per DrKozlow...  OBSTRUCTIVE SLEEP APNEA (ICD-327.23) - she is followed by DrClance...  ~  sleep study 2004 showed RDI= 16, desat to 78%, PLMS= 248 w/o sleep disruption... pt opted for diet, weight reduction...  ~  repeat sleep study 8/10 showed mild OSA w/ AHI=15 & desat to 83%... she refused CPAP & opted for the dental appliance- went to [facility] DrEssick w/ mandibular advancement appliance made & she is very happy.  ~  f/u DrClance 7/11 w/ persistant daytime hypersomnolence- he planned repeat sleep study.  SARCOIDOSIS (ICD-135) - Eval for dyspnea revealed evid for pulmonary hypertension and CTChest 4/08 showed +lymphadenopathy... she had a supraclavicular node biopsy 5/08 by DrStreck that revealed extensive granulomatous inflammation... ACE level= 65, sed= 35, ANA= neg, CBC/ chems= normal..[name] Kitchen  PREDNISONE therapy started  & slowly weaned>>> now at 5mg - 1/2 tab Qod.  ~  PFT's 12/08 showed FVC=2.62 84%), FEV1=1.74 (71%), %1sec=67, mid-flows=33%pred...  ~  CXR 9/09 showed stable chr interstitial markings, no adenopathy, scarring at the bases, pacer, NAD...  ~  labs 9/09 showed ACE= 47... rec decr Pred5  to 1/2 tab Qod...  ~  CT Chest 4/10 showed calcif hilar & mediast nodes- old gran dis, bilat scarring is stable (no active lung dis), asymmetric osteoarth in  sternoclav joint...  ~  CXR 1/11 showed stable scarring & pacer, NAD.[name] Kitchen.  ~  CXR 8/11 showed similar w/ borderline heart size, pacer, prom PA, & scarring...  PULMONARY HYPERTENSION (ICD-416.8) -  Eval by Cardiology & DrClance 2004- he noted low PVR readings, eventually had sleep study done- see above;  TEE 4/05 showed norm LVF w/ EF=55-65%, no regional wall motion abn, mild MR, mod TR & incr peak pulm sys pressure & norm RV;  etiology believed to be poss combination of sarcoid, obesity, sleep apnea, hx of DVT...  Hx of ASTHMATIC BRONCHITIS, ACUTE (ICD-466.0) - she was dx w/ reflux related airway dis by DrKozlow in 2009...  PULMONARY EMBOLISM (ICD-415.19) ** SEE ABOVE **  HYPERTENSION (ICD-401.9) - controlled on METOPROLOL 25mg /d... BP= 136/84 today- denies HA, visual changes, CP, palipit, dizziness, syncope, edema, etc...   CORONARY ARTERY DISEASE (ICD-414.00) - adenosine cardiolite 8/04 showed distal ant & apical infarct w/ mild ischemia & EF=33%;  cardiac cath 9/04 showed min non-obstructive CAD & LVD w/ EF=46%, global HK, 2+MR, PA press 50/24 w/ wedge 20;  TEE 4/05 - see above... Hosp August 07, 2023 w/ CP/ SOB/ fatigue- cath showed norm Lmain, 30-40% LAD, 30% ostial CIRC, norm RCA, ant wall HK w/ EF=40%... she had LBBB, high grade AV block, pacer inserted...  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 Hosp August 07, 2023 w/ high grade block- pacer placed and pt feeling better...  VENOUS INSUFFICIENCY (ICD-459.81) - she has VV/ VI and follows a low sodium diet, elevation, support hose, etc...  Hx of DVT (ICD-453.40) - prev on Coumadin & off since cerebellar bleed in 2006... NOTE: she had neg venous dopplers 8/11 adm for PE.  HYPERLIPIDEMIA (ICD-272.4) - prev on diet alone, Lipitor added 9/09 by DrMcAlhaney but INTOL, changed to PRAVASTATIN 40mg /d...  ~  FLP 8/07 on diet showed TChol 190, TG 131, HDL 31, LDL 133... did  not want med Rx.  ~  FLP 2023-08-07 in [place] showed TChol 182, TG 143, HDL 28, LDL 125... Lipitor Rx started.  ~  FLP 12/09 on Lip10 showed TChol 154, TG 117, HDL 37, LDL 93... DrTaylor changed to Prav40 due to muscle aches.  ~  FLP 7/10 on Prav40 showed TChol 167, Tg 140, HDL 39, LDL 100  OBESITY (JXB-147.82) - peak weight of 226# in 2009... discussed diet + exercise.  ~  weight 1/10 = 215#  ~  weight 7/10 = 216#  ~  weight 1/11 = 215#... we reviewed diet + exercise program.  ~  weight 8/11 = 200#  Hx of PUD (ICD-533.90) - on OMEPRAZOLE 40mg  Bid...   HEMORRHOIDS (ICD-455.6) - last colonoscopy 11/01 by DrStark was neg x hems; f/u 10 yrs.  RENAL INSUFFICIENCY (ICD-588.9), & UTI (ICD-599.0) - see 8/11 hosp w/ Creat= 4.2 on adm... improved to 1.14 by disch w/ fluid management.  ~  labs 8/11 post disch showed BUN= 15, Creat=  1.1 (off diuretics etc)...  DEGENERATIVE JOINT DISEASE (ICD-715.90) - c/o neck & shoulder pain- saw DrRowan w/ XRays of neck= "6 spurs" per pt & he rec PT which she says didn't help... therefore went to chiropractor w/ some benefit but short lived betw appts... can't do MRI due to pacer... noticed knot at clavicular head near sternum- tender to palp, no known trauma... XRay & CTscan showed severe degen dis... Rx ETODOLAC.  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  R/O OTHER CONVULSIONS (ICD-780.39) - eval by DrWeyman & refer to [facility] for unusual episodes of "fear & gait instability w/ fall">> neg ambulatory EEG from DrDeToledo in the Epilepsy Monitoring Unit... 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 she notes occas headaches behind her left ear and in the right temporal area... Tylenol usually helps these pains...  ANXIETY (ICD-300.00) - on Clorazepate 7.5mg   Tid...   Preventive Screening-Counseling & Management  Alcohol-Tobacco     Alcohol drinks/day: 0     Smoking Status: never  Allergies: 1)  ! Sulfa  Comments:  Nurse/Medical Assistant: The patient's medications and allergies were reviewed with the patient and were updated in the Medication and Allergy Lists.  Past History:  Past Medical History: ALLERGIC RHINITIS (ICD-477.9) OBSTRUCTIVE SLEEP APNEA (ICD-327.23) SARCOIDOSIS (ICD-135) PULMONARY HYPERTENSION (ICD-416.8) Hx of ASTHMATIC BRONCHITIS, ACUTE (ICD-466.0) PULMONARY EMBOLISM (ICD-415.19) HYPERTENSION (ICD-401.9) CORONARY ARTERY DISEASE (ICD-414.00) Hx of SYNCOPE (ICD-780.2) CARDIAC PACEMAKER IN SITU (ICD-V45.01) VENOUS INSUFFICIENCY (ICD-459.81) Hx of DVT (ICD-453.40) HYPERLIPIDEMIA (ICD-272.4) OBESITY (ICD-278.00) Hx of PUD (ICD-533.90) HEMORRHOIDS (ICD-455.6) RENAL INSUFFICIENCY (ICD-588.9) UTI (ICD-599.0) DEGENERATIVE JOINT DISEASE (ICD-715.90) CERVICALGIA (ICD-723.1) BACK PAIN, LUMBAR (ICD-724.2) Hx of STROKE (ICD-434.91) R/O OTHER CONVULSIONS (ICD-780.39) ANXIETY (ICD-300.00)  Past Surgical History: S/P Appendectomy S/P Cholecystectomy by DrBlievernicht 2/97 S/P Hysterectomy S/P Lumbar Disc Surgery in 1990 S/P Lft Knee Arthroscopy S/P IVC Filter placed 8/11  Family History: Reviewed history from 09/04/2008 and no changes required. Father died age 70 with MI Mother is 75 and in good health 2 siblings: 1 sister died of cancer at age 31 1 brother died of MI at age 33  Social History: Reviewed history from 09/04/2008 and no changes required. Widow 4 children retired non-smoker no alcohol  Review of Systems      See HPI       The patient complains of dyspnea on exertion, muscle weakness, and difficulty walking.  The patient denies anorexia, fever, weight loss, weight gain, vision loss, decreased hearing, hoarseness, chest pain, syncope, peripheral edema, prolonged cough, headaches,  hemoptysis, abdominal pain, melena, hematochezia, severe indigestion/heartburn, hematuria, incontinence, suspicious skin lesions, transient blindness, depression, unusual weight change, abnormal bleeding, enlarged lymph nodes, and angioedema.    Vital Signs:  Patient profile:   75 year old female Height:      66 inches Weight:      200.25 pounds O2 Sat:      94 % on Room air Temp:     101.0 degrees F oral Pulse rate:   115 / minute BP sitting:   136 / 84  (left arm) Cuff size:   large  Vitals Entered By: Randell Loop CMA (August 17, 2010 2:59 PM)  O2 Sat at Rest %:  94 O2 Flow:  Room air CC: 7 month ROV & post hospital visit... Is Patient Diabetic? No Pain Assessment Patient  in pain? no      Comments meds updated today with pt  Ambulatory Pulse Oximetry  Resting; HR 109_    02 Sat 96 ra  Lap1 (185 feet)   HR  130   02 Sat  96 Lap2 (185 feet)   HR_____   02 Sat_____    Lap3 (185 feet)   HR_____   02 Sat_____  ___Test Completed without Difficulty XXX___Test Stopped due to: halfway through the walk pt had to stop and rest--HR at 130  and sats at 95%--pt was visibly tired and unable to complete another lap Randell Loop CMA  August 17, 2010 5:07 PM     Physical Exam  Additional Exam:  WD, Overweight, 75 y/o WF in NAD... GENERAL:  Alert & oriented; pleasant & cooperative... HEENT:  [place]/AT, EOM-wnl, PERRLA, EACs-clear, TMs-wnl, NOSE- dry MM, THROAT-clear & wnl. NECK:  Supple w/ fair ROM; no JVD; normal carotid impulses w/o bruits; no thyromegaly or nodules palpated; no lymphadenopathy palpated -  scar of prev supraclav LN bx... prominent right clavicular head... CHEST:  Clear to P & A; without wheezes/ rales/ or rhonchi heard... HEART:  Regular rhythm, no murmurs/ rubs/ gallops detected... ABDOMEN:  Obese, soft & nontender; normal bowel sounds; no organomegaly or masses palpated... EXT: without deformities, mild arthritic changes; + ven insuffic and tr edema noted. NEURO:   CN's intact; no focal neuro deficits... DERM:  No lesions noted; no rash etc...    CXR  Procedure date:  08/17/2010  Findings:      CHEST - 2 VIEW Comparison: 08/08 and 07/22/2010   Findings: A dual lead pacer remains stable in position.  Heart and mediastinal contours are unchanged with heart size upper limits of normal.  Some prominence of the pulmonary artery segments is again seen.  The lung fields are notable for an unchanged area of wedge- shaped infiltrate in the right upper lung zone.  This may be related to parenchymal changes associated with known pulmonary embolism in this area.  Unchanged bronchitic changes are seen with linear scarring in the medial aspect of the right lower lobe.  No pleural fluid or new focal infiltrates are suggested.  Bony structures are intact.   IMPRESSION: Unchanged cardiopulmonary appearance with no new focal or acute abnormality suggested.   Read By:  Bertha Stakes,  M.D   MISC. Report  Procedure date:  08/17/2010  Findings:      BMP (METABOL)   Sodium                    139 mEq/L                   135-145   Potassium                 5.0 mEq/L                   3.5-5.1   Chloride                  102 mEq/L                   96-112   Carbon Dioxide            28 mEq/L                    19-32   Glucose              [H]  121 mg/dL  70-99   BUN                       15 mg/dL                    9-56   Creatinine                1.1 mg/dL                   2.1-3.0   Calcium                   9.5 mg/dL                   8.6-57.8   GFR                       50.34 mL/min                >60  Hepatic/Liver Function Panel (HEPATIC)   Total Bilirubin           0.7 mg/dL                   4.6-9.6   Direct Bilirubin          0.2 mg/dL                   2.9-5.2   Alkaline Phosphatase      62 U/L                      39-117   AST                       37 U/L                      0-37   ALT                  [H]  37 U/L                       0-35   Total Protein             7.9 g/dL                    8.4-1.3   Albumin                   3.8 g/dL                    2.4-4.0  CBC Platelet w/Diff (CBCD)   White Cell Count          9.5 K/uL                    4.5-10.5   Red Cell Count            4.02 Mil/uL                 3.87-5.11   Hemoglobin                12.0 g/dL                   10.2-72.5   Hematocrit                36.3 %  36.0-46.0   MCV                       90.3 fl                     78.0-100.0   Platelet Count            234.0 K/uL                  150.0-400.0   Neutrophil %         [H]  79.2 %                      43.0-77.0   Lymphocyte %         [L]  10.5 %                      12.0-46.0   Monocyte %                8.2 %                       3.0-12.0   Eosinophils%              1.6 %                       0.0-5.0   Basophils %               0.5 %                       0.0-3.0  Comments:      UDip w/Micro (URINE)   Color                     DK. ORANGE   Clarity                   CLOUDY                      Clear   Specific Gravity          1.025                       1.000 - 1.030   Urine Ph                  5.5                         5.0-8.0   Protein                   30                          Negative   Urine Glucose             NEGATIVE                    Negative   Ketones                   TRACE                       Negative   Urine Bilirubin           NEGATIVE  Negative   Blood                     NEGATIVE                    Negative   Urobilinogen              1.0                         0.0 - 1.0   Leukocyte Esterace        SMALL                       Negative   Nitrite                   POSITIVE                    Negative   Urine WBC                 11-20/hpf                   0-2/hpf   Urine RBC                 0-2/hpf                     0-2/hpf   Urine Epith               Few(5-10/hpf)               Rare(0-4/hpf)   Renal Epithelial           Rare(0-4/hpf)               None   Urine Bacteria            Many(>50/hpf)               None   Granular Casts            Presence of                 None   Impression & Recommendations:  Problem # 1:  UTI (ICD-599.0) She appears to have acute cystitis to account for her fever etc... we will cover w/ CIPRO Bid & observe. Her updated medication list for this problem includes:    Ciprofloxacin Hcl 500 Mg Tabs (Ciprofloxacin hcl) .[name] Kitchen... Take 1 tab by mouth two times a day til gone...  Problem # 2:  PULMONARY EMBOLISM (ICD-415.19) Hosp data reviewed w/ pt> H&P, Consult notes, DCSummaries, +her [employer]... per Neuro she has an absolute contraindication for Coumadin (prev Cbll bleed while on Coumadin for DVT), and now has IVC filter in place... Her updated medication list for this problem includes:    Adult Aspirin Ec Low Strength 81 Mg Tbec (Aspirin) .[name] Kitchen... 1 by mouth once daily  Orders: T-2 View CXR (71020TC) TLB-BMP (Basic Metabolic Panel-BMET) (80048-METABOL) TLB-Hepatic/Liver Function Pnl (80076-HEPATIC) TLB-CBC Platelet - w/Differential (85025-CBCD) TLB-Udip w/ Micro (81001-URINE)  Problem # 3:  OBSTRUCTIVE SLEEP APNEA (ICD-327.23) Repeat sleep study planned by DrClance is on hold for now...  Problem # 4:  SARCOIDOSIS (ICD-135) She remains on small dose Pred>  prev data reviewed above in HPI... continue 5mg - 1/2 tab Qod for now...  Problem # 5:  Hx of ASTHMATIC BRONCHITIS, ACUTE (ICD-466.0) Now on NEBS, Mucinex, nocturnal O2, etc... Her  updated medication list for this problem includes:    Flovent Hfa 44 Mcg/act Aero (Fluticasone propionate  hfa) .[name] Kitchen... 2 puffs two times a day...    Ciprofloxacin Hcl 500 Mg Tabs (Ciprofloxacin hcl) .[name] Kitchen... Take 1 tab by mouth two times a day til gone...  Problem # 6:  PULMONARY HYPERTENSION (ICD-416.8) She has mult obvious potential etiologies now... continue current Rx.  Problem # 7:  CORONARY ARTERY DISEASE (ICD-414.00) Known  non-obstructive CAD... prev non-ischemic cardiomyopathy & she will need f/u 2DEcho soon. The following medications were removed from the medication list:    Diovan 160 Mg Tabs (Valsartan) .[name] Kitchen... 1 by mouth once daily Her updated medication list for this problem includes:    Adult Aspirin Ec Low Strength 81 Mg Tbec (Aspirin) .[name] Kitchen... 1 by mouth once daily    Metoprolol Succinate 25 Mg Xr24h-tab (Metoprolol succinate) .[name] Kitchen... Take 1 tablet by mouth once a day    Hydrochlorothiazide 25 Mg Tabs (Hydrochlorothiazide) .[name] Kitchen... 1 by mouth once daily  Problem # 8:  MULT OTHER MEDICAL PROBLEMS AS NOTED>>>> Renal function has ret to normal... Creat= 1.1  Complete Medication List: 1)  Nasonex 50 Mcg/act Susp (Mometasone furoate) .... 2 sp in each nostril at bedtime.[name] KitchenMarland Kitchen 2)  Zyrtec Allergy 10 Mg Tabs (Cetirizine hcl) .[name] Kitchen.. 1 by mouth once daily 3)  Nebulizer W/ Albuterol & Ipratropium  .... Use in neb 4 times daily.[name] KitchenMarland Kitchen 4)  Flovent Hfa 44 Mcg/act Aero (Fluticasone propionate  hfa) .... 2 puffs two times a day... 5)  Prednisone 5 Mg Tabs (Prednisone) .... 1/2 tab by mouth every other day as directed 6)  Adult Aspirin Ec Low Strength 81 Mg Tbec (Aspirin) .[name] Kitchen.. 1 by mouth once daily 7)  Metoprolol Succinate 25 Mg Xr24h-tab (Metoprolol succinate) .... Take 1 tablet by mouth once a day 8)  Hydrochlorothiazide 25 Mg Tabs (Hydrochlorothiazide) .[name] Kitchen.. 1 by mouth once daily 9)  Klor-con M20 20 Meq Tbcr (Potassium chloride crys cr) .[name] Kitchen.. 1 by mouth once daily 10)  Pravastatin Sodium 40 Mg Tabs (Pravastatin sodium) .... Take 1 tablet by mouth once a day 11)  Omeprazole 40 Mg Cpdr (Omeprazole) .... Take 1 tablet by mouth two times a day 12)  Centrum Tabs (Multiple vitamins-minerals) .[name] Kitchen.. 1 by mouth once daily 13)  Protegra Caps (Multiple vitamins-minerals) .[name] Kitchen.. 1 by mouth once daily 14)  Vitamin D-1000 [address] St [phone] Mg-unit Tabs (Calcium-vitamin d) .... Once daily 15)  Clorazepate Dipotassium 7.5 Mg Tabs (Clorazepate dipotassium)  .... Take 1 tab by mouth three times a day as needed for nerves... 16)  Ciprofloxacin Hcl 500 Mg Tabs (Ciprofloxacin hcl) .... Take 1 tab by mouth two times a day til gone... 17)  Magicv Mouthwash  .... 1 ts gargle & swallow up to 4 times daily as needed for sore throat...  Patient Instructions: 1)  Today we updated your med list- see below.... 2)  We collated our previous med list with your discharge med list from [facility]... 3)  Today we want to check a CXR & blood work > please call the "phone tree" in a few days for your lab results.[name] KitchenMarland Kitchen 4)  In the interim: we will start antibiotic therapy w/ CIPRO take 1 tab twice daily.[name] KitchenMarland Kitchen 5)  For your nose:  use SALINE nasal mist> spray every 1-2 H as needed to keep moisturized & gently blow to clear any mucus... 6)  For your throat:  use the MAGIC MOUTHWASH 1 tsp gargle & swallow up to 4 times daily as needed.[name] KitchenMarland Kitchen 7)  Continue the home physical therapy... 8)  Let's plan a follow up appt in 1 month... Prescriptions: MAGICV MOUTHWASH 1 ts gargle & swallow up to 4 times daily as needed for sore throat...  #4 oz x 2   Entered and Authorized by:   Michele Mcalpine MD   Signed by:   Michele Mcalpine MD on 08/17/2010   Method used:   Print then Give to Patient   RxID:   [id number] CIPROFLOXACIN HCL 500 MG TABS (CIPROFLOXACIN HCL) take 1 tab by mouth two times a day til gone...  #14 x 0   Entered and Authorized by:   Michele Mcalpine MD   Signed by:   Michele Mcalpine MD on 08/17/2010   Method used:   Print then Give to Patient   RxID:   [id number]

## 2011-01-17 NOTE — Procedures (Signed)
Summary: pcp  Medications Added * NEBULIZER W/ ALBUTEROL & IPRATROPIUM use in NEB two times a day      Allergies Added:   Current Medications (verified): 1)  Nasonex 50 Mcg/act Susp (Mometasone Furoate) .... 2 Sp in Each Nostril At Bedtime.[name] KitchenMarland Kitchen 2)  Zyrtec Allergy 10 Mg  Tabs (Cetirizine Hcl) .[name] Kitchen.. 1 By Mouth Once Daily 3)  Nebulizer W/ Albuterol & Ipratropium .... Use in Neb Two Times A Day 4)  Flovent Hfa 44 Mcg/act Aero (Fluticasone Propionate  Hfa) .... 2 Puffs Two Times A Day... 5)  Prednisone 5 Mg  Tabs (Prednisone) .... 1/2 Tab By Mouth Every Other Day As Directed 6)  Metoprolol Succinate 25 Mg Xr24h-Tab (Metoprolol Succinate) .... Take 1 Tablet By Mouth Once A Day 7)  Furosemide 20 Mg Tabs (Furosemide) .... Take 2 Tablets By Mouth Once A Day 8)  Pravastatin Sodium 40 Mg Tabs (Pravastatin Sodium) .... Take 1 Tablet By Mouth Once A Day 9)  Omeprazole 40 Mg Cpdr (Omeprazole) .... Take 1 Tablet By Mouth Two Times A Day 10)  Centrum   Tabs (Multiple Vitamins-Minerals) .[name] Kitchen.. 1 By Mouth Once Daily 11)  Protegra   Caps (Multiple Vitamins-Minerals) .[name] Kitchen.. 1 By Mouth Once Daily 12)  Vitamin D-1000 Max St [phone] Mg-Unit Tabs (Calcium-Vitamin D) .... Once Daily 13)  Clorazepate Dipotassium 7.5 Mg  Tabs (Clorazepate Dipotassium) .... Take 1 Tab By Mouth Three Times A Day As Needed For Nerves... 14)  Systane 0.4-0.3 % Soln (Polyethyl Glycol-Propyl Glycol) .... Place 2 Drops in Both Eyes Once Daily 15)  Tylenol 325 Mg Tabs (Acetaminophen) .... As Needed For Pain 16)  Folic Acid 1 Mg Tabs (Folic Acid) .... Take 1 Tablet By Mouth Once A Day  Allergies (verified): 1)  ! Sulfa 2)  ! Imitrex  PPM Specifications Following MD:  Lewayne Bunting, MD     PPM Vendor:  [employer]     PPM Model Number:  [id number]     PPM Serial Number:  [id number] PPM DOI:  08/04/2008     PPM Implanting MD:  Lewayne Bunting, MD  Lead 1    Location: RA     DOI: 08/04/2008     Model #: [id number]     Serial #: [id number]     Status:  active Lead 2    Location: RV     DOI: 08/04/2008     Model #: [id number]     Serial #: [id number]     Status: active  Magnet Response Rate:  BOL 85 ERI 65  Indications:  Complete heart block   PPM Follow Up Remote Check?  No Battery Voltage:  2.79 V     Battery Est. Longevity:  8 years     Pacer Dependent:  Yes       PPM Device Measurements Atrium  Amplitude: 4.0 mV, Impedance: 410 ohms, Threshold: 0.375 V at 0.4 msec Right Ventricle  Amplitude: 11.20 mV, Impedance: 548 ohms, Threshold: 0.75 V at 0.4 msec  Episodes MS Episodes:  628     Percent Mode Switch:  3.5%     Coumadin:  No Ventricular High Rate:  1     Atrial Pacing:  3.7%     Ventricular Pacing:  99.4%  Parameters Mode:  DDDR     Lower Rate Limit:  60     Upper Rate Limit:  130 Paced AV Delay:  170     Sensed AV Delay:  150 Next Cardiology Appt Due:  12/18/2010 Tech Comments:  No  parameter changes.  Device function normal.  Ms. Carta is on a medication by injection that replaces the lovonox but she doesn't remember the name.  ROV 3 months with Dr. Ladona Ridgel. Altha Harm, LPN  October 17, 2010 4:29 PM

## 2011-01-17 NOTE — Assessment & Plan Note (Signed)
Summary: 6 months/apc   Primary Care Provider:  Kriste Basque  CC:  6 month ROV & review of mult medical problems....  History of Present Illness: 75 y/o WF w/ hx prob sarcoidosis and mild pulm HTN (based on this, obesity, mild sleep apnea, & hx DVT)... she has been weaned down to PREDNISONE 5mg Qod and is doing well...   Hospitalized by Cardiology 8/09 w/ high grade block & cath showing non-obstructive CAD, HBP & decr LVF= 40%... Med Rx per [facility] & DrTaylor placed pacemaker... he added Lipitor for her Chol but she doesn't like this med "I can't function" muscle aches & he switched her to Pravachol 12/09 (tolerating better so far)... her BP has been under fair control w/ METOPROLOL 25mg /d, DIOVAN 160mg /d, and LASIX 20mg - 1-2tabs/d & K20/d...   ~  July 05, 2009:  states that she is doing OK... wants refills for 90day supplies... she is c/o "sleepy" & has been taking Soma... Obese w/ weight 216#, had sleep study 2004 w/ RDI= 16 & O2 desat to 78%... also had large # leg jerks but without sleep disruption... we discussed poss repeat sleep study, appt w/ DrClance...   ~  January 03, 2010:  repeat sleep study 8/10 showed mild OSA w/ AHI=15 & desat to 83%... she refused CPAP & opted for the dental appliance- went to [employer] DrEssick w/ mandibular advancement appliance made & she really likes it!!!  Also saw DrTaylor 9/10 w/ pacer functioning well- & BP reasonably well controlled on meds... no new complaints or concerns today... we will check CXR, but she prefers to wait until return OV for her fasting blood work...   Problem List:  ALLERGIC RHINITIS (ICD-477.9) - she uses NASONEX & ZYRTEK per DrKozlow...  OBSTRUCTIVE SLEEP APNEA (ICD-327.23) - she was evaluated by DrClance in 2004...  ~  sleep study 2004 showed RDI= 16, desat to 78%, PLMS= 248 w/o sleep disruption... pt opted for diet, weight reduction...  ~  repeat sleep study 8/10 showed mild OSA w/ AHI=15 & desat to 83%... she refused CPAP & opted for  the dental appliance- went to [facility] DrEssick w/ mandibular advancement appliance made & she is very happy.  SARCOIDOSIS (ICD-135) - Eval for dyspnea revealed evid for pulmonary hypertension and CTChest 4/08 showed +lymphadenopathy... she had a supraclavicular node biopsy 5/08 by DrStreck that revealed extensive granulomatous inflammation... ACE level= 65, sed= 35, ANA= neg, CBC/ chems= normal... PREDNISONE therapy started  & slowly weaned>>>  ~  PFT's 12/08 showed FVC=2.62 84%), FEV1=1.74 (71%), %1sec=67, mid-flows=33%pred...  ~  CXR 9/09 showed stable chr interstitial markings, no adenopathy, scarring at the bases, pacer, NAD...  ~  labs 9/09 showed ACE= 47... rec decr Pred5  to 1/2 tab Qod...  ~  CT Chest 4/10 showed calcif hilar & mediast nodes- old gran dis, bilat scarring is stable (no active lung dis), asymmetric osteoarth in sternoclav joint...  ~  CXR 1/11 showed stable scarring & pacer, NAD...  PULMONARY HYPERTENSION (ICD-416.8) -  Eval by Cardiology & DrClance 2004- he noted low PVR readings, eventually had sleep study done- see above;  TEE 4/05 showed norm LVF w/ EF=55-65%, no regional wall motion abn, mild MR, mod TR & incr peak pulm sys pressure & norm RV;  etiology believed to be poss combination of sarcoid, obesity, sleep apnea, hx of DVT...  Hx of ASTHMATIC BRONCHITIS, ACUTE (ICD-466.0) - she was dx w/ reflux related airway dis by DrKozlow in 2009...  HYPERTENSION (ICD-401.9) - controlled on METOPROLOL 25mg /d,  DIOVAN  160mg /d,  LASIX 20mg /d,  K20/d... BP= 136/88 today, tolerates meds well-  denies HA, fatigue, visual changes, CP, palipit, dizziness, syncope, dyspnea, edema, etc...   CORONARY ARTERY DISEASE (ICD-414.00) - adenosine cardiolite 8/04 showed distal ant & apical infarct w/ mild ischemia & EF=33%;  cardiac cath 9/04 showed min non-obstructive CAD & LVD w/ EF=46%, global HK, 2+MR, PA press 50/24 w/ wedge 20;  TEE 4/05 - see above... Hosp 08/12/23 w/ CP/ SOB/ fatigue- cath showed  norm Lmain, 30-40% LAD, 30% ostial CIRC, norm RCA, ant wall HK w/ EF=40%... she had LBBB, high grade AV block, pacer inserted...  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 Hosp 2023-08-12 w/ high grade block- pacer placed and pt feeling better...  VENOUS INSUFFICIENCY (ICD-459.81) - she has VV/ VI and follows a low sodium diet, elevation, support hose, etc...  Hx of DVT (ICD-453.40) prev on coumadin, off since cerebellar bleed in 2006.  HYPERLIPIDEMIA (ICD-272.4) - prev on diet alone, Lipitor added 9/09 by DrMcAlhaney but INTOL, changed to PRAVASTATIN 40mg /d...  ~  FLP 8/07 on diet showed TChol 190, TG 131, HDL 31, LDL 133... did not want med Rx.  ~  FLP Aug 12, 2023 in [place] showed TChol 182, TG 143, HDL 28, LDL 125... Lipitor Rx started.  ~  FLP 12/09 on Lip10 showed TChol 154, TG 117, HDL 37, LDL 93... DrTaylor changed to Prav40 due to muscle aches.  ~  FLP 7/10 on Prav40 showed TChol 167, Tg 140, HDL 39, LDL 100  OBESITY (ZOX-096.04) - peak weight of 226# in 2009... discussed diet + exercise.  ~  weight 1/10 = 215#  ~  weight 7/10 = 216#  ~  weight 1/11 = 215#... we reviewed diet + exercise program.  Hx of PUD (ICD-533.90) - on OMEPRAZOLE 40mg  Bid...   HEMORRHOIDS (ICD-455.6) - last colonoscopy 11/01 by DrStark was neg x hems; f/u 10 yrs.  DEGENERATIVE JOINT DISEASE (ICD-715.90) - c/o neck & shoulder pain- saw DrRowan w/ XRays of neck= "6 spurs" per pt & he rec PT which she says didn't help... therefore went to chiropractor w/ some benefit but short lived betw appts... can't do MRI due to pacer... noticed knot at clavicular head near sternum- tender to palp, no known trauma... XRay & CTscan showed severe degen dis... Rx ETODOLAC.  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  R/O OTHER CONVULSIONS (ICD-780.39) - eval by DrWeyman & refer to [facility] for unusual episodes of "fear & gait instability w/ fall">> neg ambulatory EEG from DrDeToledo in the Epilepsy Monitoring Unit... 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 she notes occas headaches behind her left ear and in the right temporal area... Tylenol usually helps these pains...  ANXIETY (ICD-300.00) - on Clorazepate 7.5mg  Tid...    Allergies: 1)  ! Sulfa  Comments:  Nurse/Medical Assistant: The patient's medications and allergies were reviewed with the patient and were updated in the Medication and Allergy Lists.  Past History:  Past Medical History: ALLERGIC RHINITIS (ICD-477.9) OBSTRUCTIVE SLEEP APNEA (ICD-327.23) SARCOIDOSIS (ICD-135) PULMONARY HYPERTENSION (ICD-416.8)-venous in origin from right heart cath in past. Hx of ASTHMATIC BRONCHITIS, ACUTE (ICD-466.0) HYPERTENSION (ICD-401.9) CORONARY ARTERY DISEASE (ICD-414.00) Hx of SYNCOPE (ICD-780.2) CARDIAC PACEMAKER IN  SITU (ICD-V45.01) VENOUS INSUFFICIENCY (ICD-459.81) Hx of DVT (ICD-453.40) HYPERLIPIDEMIA (ICD-272.4) OBESITY (ICD-278.00) Hx of PUD (ICD-533.90) HEMORRHOIDS (ICD-455.6) DEGENERATIVE JOINT DISEASE (ICD-715.90) CERVICALGIA (ICD-723.1) BACK PAIN, LUMBAR (ICD-724.2) Hx of STROKE (ICD-434.91) R/O OTHER CONVULSIONS (ICD-780.39) ANXIETY (ICD-300.00)  Past Surgical History: S/P Appendectomy S/P Cholecystectomy by DrBlievernicht 2/97 S/P Hysterectomy S/P Lumbar Disc Surgery in 1990 S/P Lft Knee Arthroscopy  Family History: Reviewed history from 09/04/2008 and no changes required. Father died age 55 with MI Mother is 29 and in good health 2 siblings: 1 sister died of cancer at age 42 1 brother died of MI at age 29  Social History: Reviewed history from 09/04/2008 and no changes required. Widow 4 children retired non-smoker no  alcohol  Review of Systems      See HPI  The patient denies anorexia, fever, weight loss, weight gain, vision loss, decreased hearing, hoarseness, chest pain, syncope, dyspnea on exertion, peripheral edema, prolonged cough, headaches, hemoptysis, abdominal pain, melena, hematochezia, severe indigestion/heartburn, hematuria, incontinence, muscle weakness, suspicious skin lesions, transient blindness, difficulty walking, depression, unusual weight change, abnormal bleeding, enlarged lymph nodes, and angioedema.    Vital Signs:  Patient profile:   75 year old female Height:      66 inches Weight:      214.38 pounds BMI:     34.73 O2 Sat:      96 % on Room air Temp:     97.8 degrees F oral Pulse rate:   94 / minute BP sitting:   138 / 86  (right arm) Cuff size:   regular  Vitals Entered By: Randell Loop CMA (January 03, 2010 1:55 PM)  O2 Sat at Rest %:  96 O2 Flow:  Room air CC: 6 month ROV & review of mult medical problems... Is Patient Diabetic? No Pain Assessment Patient in pain? no      Comments NO CHANGES IN MEDS   Physical Exam  Additional Exam:  WD, Overweight, 75 y/o WF in NAD... GENERAL:  Alert & oriented; pleasant & cooperative... HEENT:  August/AT, EOM-wnl, PERRLA, EACs-clear, TMs-wnl, NOSE-clear, THROAT-clear & wnl. NECK:  Supple w/ fair ROM; no JVD; normal carotid impulses w/o bruits; no thyromegaly or nodules palpated; no lymphadenopathy palpated -  scar of prev supraclav LN bx... prominent right clavicular head... CHEST:  Clear to P & A; without wheezes/ rales/ or rhonchi heard... HEART:  Regular rhythm, no murmurs/ rubs/ gallops detected... ABDOMEN:  Obese, soft & nontender; normal bowel sounds; no organomegaly or masses palpated... EXT: without deformities, mild arthritic changes; + ven insuffic and tr edema noted. NEURO:  CN's intact; no focal neuro deficits... DERM:  No lesions noted; no rash etc...     CXR  Procedure date:  01/03/2010  Findings:       CHEST - 2 VIEW   Comparison: CT of the chest of 04/06/2009 and chest x-ray of 09/04/2008   Findings: Linear scarring remains particularly at the right lung base.  No active infiltrate or effusion is seen.  Mild cardiomegaly is stable and a permanent pacemaker remains.  No mediastinal or hilar adenopathy is noted.  No bony abnormality is seen.   IMPRESSION: Stable linear scarring at the right lung base.  Stable mild cardiomegaly with permanent pacemaker.   Read By:  Juline Patch,  M.D.      Comments:      I have personally reviewed this XRay on the [employer] system- SN   Impression & Recommendations:  Problem # 1:  OBSTRUCTIVE SLEEP APNEA (ICD-327.23) Improved  w/ oral appliance...  Problem # 2:  SARCOIDOSIS (ICD-135) Stable-  no active dis on the CXR... Orders: T-2 View CXR ([id number])  Problem # 3:  PULMONARY HYPERTENSION (ICD-416.8) Clinically she is very stable w/ Rx for all her potential contributing etiologies...  Problem # 4:  HYPERTENSION (ICD-401.9) Controlled on meds... Her updated medication list for this problem includes:    Metoprolol Succinate 25 Mg Xr24h-tab (Metoprolol succinate) .[name] Kitchen... Take 1 tablet by mouth once a day    Diovan 160 Mg Tabs (Valsartan) .[name] Kitchen... 1 by mouth once daily    Lasix 20 Mg Tabs (Furosemide) .[name] Kitchen... 1-2 tabs by mouth each morning as directed...  Problem # 5:  CORONARY ARTERY DISEASE (ICD-414.00) Aware-  followed by Cards. Her updated medication list for this problem includes:    Adult Aspirin Ec Low Strength 81 Mg Tbec (Aspirin) .[name] Kitchen... 1 by mouth once daily    Metoprolol Succinate 25 Mg Xr24h-tab (Metoprolol succinate) .[name] Kitchen... Take 1 tablet by mouth once a day    Diovan 160 Mg Tabs (Valsartan) .[name] Kitchen... 1 by mouth once daily    Lasix 20 Mg Tabs (Furosemide) .[name] Kitchen... 1-2 tabs by mouth each morning as directed...  Problem # 6:  CARDIAC PACEMAKER IN SITU (ICD-V45.01) Pacer per drTaylor...  Problem # 7:  HYPERLIPIDEMIA (ICD-272.4) Stable on  Prav40... Her updated medication list for this problem includes:    Pravastatin Sodium 40 Mg Tabs (Pravastatin sodium) .[name] Kitchen... Take 1 tablet by mouth once a day  Problem # 8:  OBESITY (ICD-278.00) Diet + exercise are the keys...  Problem # 9:  OTHER MEDICAL PROBLEMS AS LISTED>>>  Complete Medication List: 1)  Nasonex 50 Mcg/act Susp (Mometasone furoate) .... 2 sp in each nostril at bedtime.[name] KitchenMarland Kitchen 2)  Zyrtec Allergy 10 Mg Tabs (Cetirizine hcl) .[name] Kitchen.. 1 by mouth once daily 3)  Prednisone 5 Mg Tabs (Prednisone) .... Take as directed 4)  Adult Aspirin Ec Low Strength 81 Mg Tbec (Aspirin) .[name] Kitchen.. 1 by mouth once daily 5)  Metoprolol Succinate 25 Mg Xr24h-tab (Metoprolol succinate) .... Take 1 tablet by mouth once a day 6)  Diovan 160 Mg Tabs (Valsartan) .[name] Kitchen.. 1 by mouth once daily 7)  Lasix 20 Mg Tabs (Furosemide) .[name] Kitchen.. 1-2 tabs by mouth each morning as directed... 8)  Klor-con M20 20 Meq Tbcr (Potassium chloride crys cr) .[name] Kitchen.. 1 by mouth once daily 9)  Pravastatin Sodium 40 Mg Tabs (Pravastatin sodium) .... Take 1 tablet by mouth once a day 10)  Omeprazole 40 Mg Cpdr (Omeprazole) .... Take 1 tablet by mouth two times a day 11)  Etodolac 400 Mg Tabs (Etodolac) .... Take 1 tab by mouth two times a day w/ food as needed for arthritis pain... 12)  Oscal 500/200 D-3 500-200 Mg-unit Tabs (Calcium-vitamin d) .... Take one tablet by mouth three times a day 13)  Centrum Tabs (Multiple vitamins-minerals) .[name] Kitchen.. 1 by mouth once daily 14)  Protegra Caps (Multiple vitamins-minerals) .[name] Kitchen.. 1 by mouth once daily 15)  Vitamin D-1000 Max St [phone] Mg-unit Tabs (Calcium-vitamin d) .... Once daily 16)  Clorazepate Dipotassium 7.5 Mg Tabs (Clorazepate dipotassium) .... Take 1 tab by mouth three times a day as needed for nerves...  Patient Instructions: 1)  Today we updated your med list- see below.... 2)  Continue your current meds the same... 3)  Today we did your follow up CXR-  please call the "phone tree" in a few days  for your results.[name] KitchenMarland Kitchen 4)  Call for any problems.[name] KitchenMarland Kitchen 5)  Let's plan a follow up  visit later this yr w/ FASTING blood work as we discvussed.[name] KitchenMarland Kitchen

## 2011-01-17 NOTE — Assessment & Plan Note (Signed)
Summary: edema//lmr   Copy to:  Kriste Basque Primary Provider/Referring Provider:  Kriste Basque  CC:  LE edema right leg and states began with a "charlie horse" Friday that resulted in the edema and pain in the right thigh - calf of right leg feels tight - pt tried elastic stockings but couldn't tolerate them - Pt reports still running low grade tep at night - Has 5 doses left of Keflex.  History of Present Illness: 75 y/o WF w/ hx prob sarcoidosis and mild pulm HTN (based on this, obesity, mild sleep apnea, & hx DVT). & cerebellar bleed on coumadin  Hospitalized by Cardiology 8/09 w/ high grade block & cath showing non-obstructive CAD, HBP & decr LVF= 40%... Med Rx per [facility] & DrTaylor placed pacemaker... he added Lipitor for her Chol but she doesn't like this med "I can't function" muscle aches & he switched her to Pravachol 12/09 (tolerating better so far).  July 22, 2010 5:23 PM  Presented with dyspnea x 3- 4 days, labs show acute renal failure cr 4.0 range, VQ scan shows several perfusion defects. She does not want to take coumadin at any cost. Pacer check ok 8/3.   ~  August 17, 2010:  in early Aug she developed sudden SOB- she thought it was her heart & saw DrTaylor but pacer was OK... went to ER w/ eval revealing bilat PE by V/Q (couldn't do CTA due to Creat=4.2), also had DDimer>20, BNP=1400, Troponin= 0.35, note- ven dopplers were neg for DVT... hx cerebellar hemorrhage (while on Coumadin in past) & Neuro consult said absolute contraindication for Coumadin Rx, therefore had IVC filter placed & transient IV heparin... Fluid management resulted in improved renal function & BNP (Creat=1.14 at end of hosp, & BNP=400)... resp management w/ NEBS etc... she was tx to rehab & disch home 08/06/10> since then c/o weak- getting home PT, and nocturnal O2 qualified... also notes low grade temps to 100+ esp at night- sl dry cough, no sputum, sore throat+mouth, +DOE, & denies GI or GU symptoms... notes occas  epistaxis related to the nasal O2... pt ambulated on RA w/ resting HR=100 & Sat=96%> one lap w/ HR=130 & Sat=95%...   **we decided to check CXR (no acute change), Labs (all improved), UA (+UTI) & Rx empirically w/ Cipro, Tylenol, Nasal Saline, & MMW.  PAGE 2 >>>>>>>>>>>>>>>>>>>>>>>>>>  August 29, 2010--Presents for an acute office visit.  Complains of LE edema right leg, states began with a "charlie horse" Friday that resulted in the edema and pain in the right thigh - calf of right leg feels tight - pt tried elastic stockings but couldn't tolerate them - Pt reports still running low grade tep at night - Has 5 doses left of Keflex for recent e.coli UTI. Says she felt pain in thigh/groin area then noticed bruising around this area yesterday. Legs started swelling 2 days ago after she felt pain. Legs feel very tight esp in knee/calf. Pt breathing at baseline w/ no increased dyspnea . NO chest pain or hemoptysis. Denies orthopnea, hemoptysis, fever, n/v/d, headache.  As above pt w/ recent admission for bilateral  PE w/ IVC filter placed during admission last month. She is not a coumadin candidate due to previous cerebellar hemorrhage on coumadin. Call report for doppler shows an extensive clot along the entire right leg w/ extension into left groin.    Medications Prior to Update: 1)  Nasonex 50 Mcg/act Susp (Mometasone Furoate) .... 2 Sp in Each Nostril At Bedtime.[name] KitchenMarland Kitchen 2)  Zyrtec  Allergy 10 Mg  Tabs (Cetirizine Hcl) .[name] Kitchen.. 1 By Mouth Once Daily 3)  Nebulizer W/ Albuterol & Ipratropium .... Use in Neb 4 Times Daily.[name] KitchenMarland Kitchen 4)  Flovent Hfa 44 Mcg/act Aero (Fluticasone Propionate  Hfa) .... 2 Puffs Two Times A Day... 5)  Prednisone 5 Mg  Tabs (Prednisone) .... 1/2 Tab By Mouth Every Other Day As Directed 6)  Adult Aspirin Ec Low Strength 81 Mg  Tbec (Aspirin) .[name] Kitchen.. 1 By Mouth Once Daily 7)  Metoprolol Succinate 25 Mg Xr24h-Tab (Metoprolol Succinate) .... Take 1 Tablet By Mouth Once A Day 8)  Hydrochlorothiazide  25 Mg Tabs (Hydrochlorothiazide) .[name] Kitchen.. 1 By Mouth Once Daily 9)  Klor-Con M20 20 Meq  Tbcr (Potassium Chloride Crys Cr) .[name] Kitchen.. 1 By Mouth Once Daily 10)  Pravastatin Sodium 40 Mg Tabs (Pravastatin Sodium) .... Take 1 Tablet By Mouth Once A Day 11)  Omeprazole 40 Mg Cpdr (Omeprazole) .... Take 1 Tablet By Mouth Two Times A Day 12)  Centrum   Tabs (Multiple Vitamins-Minerals) .[name] Kitchen.. 1 By Mouth Once Daily 13)  Protegra   Caps (Multiple Vitamins-Minerals) .[name] Kitchen.. 1 By Mouth Once Daily 14)  Vitamin D-1000 Max St [phone] Mg-Unit Tabs (Calcium-Vitamin D) .... Once Daily 15)  Clorazepate Dipotassium 7.5 Mg  Tabs (Clorazepate Dipotassium) .... Take 1 Tab By Mouth Three Times A Day As Needed For Nerves... 16)  Keflex 500 Mg Caps (Cephalexin) .... Take One Talbet By Mouth Three Times A Day Until Gone 17)  Magicv Mouthwash .... 1 Ts Gargle & Swallow Up To 4 Times Daily As Needed For Sore Throat...  Current Medications (verified): 1)  Nasonex 50 Mcg/act Susp (Mometasone Furoate) .... 2 Sp in Each Nostril At Bedtime.[name] KitchenMarland Kitchen 2)  Zyrtec Allergy 10 Mg  Tabs (Cetirizine Hcl) .[name] Kitchen.. 1 By Mouth Once Daily 3)  Nebulizer W/ Albuterol & Ipratropium .... Use in Neb 4 Times Daily.[name] KitchenMarland Kitchen 4)  Flovent Hfa 44 Mcg/act Aero (Fluticasone Propionate  Hfa) .... 2 Puffs Two Times A Day... 5)  Prednisone 5 Mg  Tabs (Prednisone) .... 1/2 Tab By Mouth Every Other Day As Directed 6)  Adult Aspirin Ec Low Strength 81 Mg  Tbec (Aspirin) .[name] Kitchen.. 1 By Mouth Once Daily 7)  Metoprolol Succinate 25 Mg Xr24h-Tab (Metoprolol Succinate) .... Take 1 Tablet By Mouth Once A Day 8)  Pravastatin Sodium 40 Mg Tabs (Pravastatin Sodium) .... Take 1 Tablet By Mouth Once A Day 9)  Omeprazole 40 Mg Cpdr (Omeprazole) .... Take 1 Tablet By Mouth Two Times A Day 10)  Centrum   Tabs (Multiple Vitamins-Minerals) .[name] Kitchen.. 1 By Mouth Once Daily 11)  Protegra   Caps (Multiple Vitamins-Minerals) .[name] Kitchen.. 1 By Mouth Once Daily 12)  Vitamin D-1000 Max St [phone] Mg-Unit Tabs  (Calcium-Vitamin D) .... Once Daily 13)  Clorazepate Dipotassium 7.5 Mg  Tabs (Clorazepate Dipotassium) .... Take 1 Tab By Mouth Three Times A Day As Needed For Nerves... 14)  Keflex 500 Mg Caps (Cephalexin) .... Take One Talbet By Mouth Three Times A Day Until Gone 15)  Magicv Mouthwash .... 1 Ts Gargle & Swallow Up To 4 Times Daily As Needed For Sore Throat...  Allergies (verified): 1)  ! Sulfa  Comments:  Nurse/Medical Assistant: The patient's medications and allergies were reviewed with the patient and were updated in the Medication and Allergy Lists.  Past History:  Past Medical History: Last updated: 08/17/2010 ALLERGIC RHINITIS (ICD-477.9) OBSTRUCTIVE SLEEP APNEA (ICD-327.23) SARCOIDOSIS (ICD-135) PULMONARY HYPERTENSION (ICD-416.8) Hx of ASTHMATIC BRONCHITIS, ACUTE (ICD-466.0) PULMONARY EMBOLISM (ICD-415.19) HYPERTENSION (ICD-401.9) CORONARY ARTERY  DISEASE (ICD-414.00) Hx of SYNCOPE (ICD-780.2) CARDIAC PACEMAKER IN SITU (ICD-V45.01) VENOUS INSUFFICIENCY (ICD-459.81) Hx of DVT (ICD-453.40) HYPERLIPIDEMIA (ICD-272.4) OBESITY (ICD-278.00) Hx of PUD (ICD-533.90) HEMORRHOIDS (ICD-455.6) RENAL INSUFFICIENCY (ICD-588.9) UTI (ICD-599.0) DEGENERATIVE JOINT DISEASE (ICD-715.90) CERVICALGIA (ICD-723.1) BACK PAIN, LUMBAR (ICD-724.2) Hx of STROKE (ICD-434.91) R/O OTHER CONVULSIONS (ICD-780.39) ANXIETY (ICD-300.00)  Past Surgical History: Last updated: 08-30-10 S/P Appendectomy S/P Cholecystectomy by DrBlievernicht 2/97 S/P Hysterectomy S/P Lumbar Disc Surgery in 1990 S/P Lft Knee Arthroscopy S/P IVC Filter placed 8/11  Family History: Last updated: 08/30/10 Father died age 79 with MI Mother is 22 and in good health 2 siblings: 1 sister died of cancer at age 77 1 brother died of MI at age 53  Social History: Last updated: 08-30-2010 Widow 4 children retired non-smoker no alcohol  Risk Factors: Alcohol Use: 0 (August 30, 2010)  Risk Factors: Smoking  Status: never (August 30, 2010)  Review of Systems      See HPI  Vital Signs:  Patient profile:   75 year old female Height:      66 inches Weight:      204.50 pounds BMI:     33.13 O2 Sat:      97 % on Room air Temp:     97.2 degrees F oral Pulse rate:   108 / minute BP sitting:   132 / 78  (left arm) Cuff size:   regular  Vitals Entered By: [name] CNA/MA (August 29, 2010 12:28 PM)  O2 Flow:  Room air CC: LE edema right leg, states began with a "charlie horse" Friday that resulted in the edema and pain in the right thigh - calf of right leg feels tight - pt tried elastic stockings but couldn't tolerate them - Pt reports still running low grade tep at night - Has 5 doses left of Keflex Is Patient Diabetic? No Comments Medications reviewed with patient Daytime contact number verified with patient. [name] CNA/MA  August 29, 2010 12:28 PM    Physical Exam  Additional Exam:  GEN: A/Ox3; pleasant , NAD HEENT:  [place]/AT, , EACs-clear, TMs-wnl, NOSE-clear, THROAT-clear NECK:  Supple w/ fair ROM; no JVD; normal carotid impulses w/o bruits; no thyromegaly or nodules palpated; no lymphadenopathy. RESP  Clear to P & A; w/o, wheezes/ rales/ or rhonchi. CARD:  RRR, no m/r/g   GI:   Soft & nt; nml bowel sounds; no organomegaly or masses detected. Musco: Warm bil,  venous insufficiency changes, pulses intact, right leg swollen w/ ecchymosis at groin/upper thigh. femoral pulses intact w/o bruit. , neg homans sign, right calf tender ,  Neuro: intact w/ no focal deficits noted.  Skin: intact, no lesions or rash noted.    Impression & Recommendations:  Problem # 1:  PULMONARY EMBOLISM (ICD-415.19)  Bilateral PE w/ IVC filter placed due to not candidate for coumdain due to previous cerebellar hemorrhage.  Previous doppler during August admission were neg for DVT.   Her updated medication list for this problem includes:    Adult Aspirin Ec Low Strength 81 Mg Tbec (Aspirin)  .[name] Kitchen... 1 by mouth once daily  Orders: Doppler Referral (Doppler) Est. Patient Level IV (16109)  Problem # 2:  DEEP VENOUS THROMBOPHLEBITIS, LEG, RIGHT (ICD-453.40)  New acute extensive right DVT w/ extension into left groin.  Case reviewed w/ Dr. Kriste Basque , pt to be admitted.  pt was advised to be transferred from doppler lab to [facility] ER.  spoke w/ [facility] ER/ and Dr. Fonnie Jarvis (ER MD) regarding case.  Pt to be evaluated  and admitted by [facility] (admitting MD for Dr. Kriste Basque ) Pt is aware.     Orders: Est. Patient Level IV (11914)  Medications Added to Medication List This Visit: 1)  Omeprazole 40 Mg Cpdr (Omeprazole) .... Take 1 tablet by mouth two times a day  Complete Medication List: 1)  Nasonex 50 Mcg/act Susp (Mometasone furoate) .... 2 sp in each nostril at bedtime.[name] KitchenMarland Kitchen 2)  Zyrtec Allergy 10 Mg Tabs (Cetirizine hcl) .[name] Kitchen.. 1 by mouth once daily 3)  Nebulizer W/ Albuterol & Ipratropium  .... Use in neb 4 times daily.[name] KitchenMarland Kitchen 4)  Flovent Hfa 44 Mcg/act Aero (Fluticasone propionate  hfa) .... 2 puffs two times a day... 5)  Prednisone 5 Mg Tabs (Prednisone) .... 1/2 tab by mouth every other day as directed 6)  Adult Aspirin Ec Low Strength 81 Mg Tbec (Aspirin) .[name] Kitchen.. 1 by mouth once daily 7)  Metoprolol Succinate 25 Mg Xr24h-tab (Metoprolol succinate) .... Take 1 tablet by mouth once a day 8)  Pravastatin Sodium 40 Mg Tabs (Pravastatin sodium) .... Take 1 tablet by mouth once a day 9)  Omeprazole 40 Mg Cpdr (Omeprazole) .... Take 1 tablet by mouth two times a day 10)  Centrum Tabs (Multiple vitamins-minerals) .[name] Kitchen.. 1 by mouth once daily 11)  Protegra Caps (Multiple vitamins-minerals) .[name] Kitchen.. 1 by mouth once daily 12)  Vitamin D-1000 Max St [phone] Mg-unit Tabs (Calcium-vitamin d) .... Once daily 13)  Clorazepate Dipotassium 7.5 Mg Tabs (Clorazepate dipotassium) .... Take 1 tab by mouth three times a day as needed for nerves... 14)  Keflex 500 Mg Caps (Cephalexin) .... Take one talbet by mouth three  times a day until gone 15)  Magicv Mouthwash  .... 1 ts gargle & swallow up to 4 times daily as needed for sore throat...  Patient Instructions: 1)  We are setting you up for a venous doppler of your right leg.  2)  I will contact with results. and recommendations 3)  Keep leg elevated.  4)  Please contact office for sooner follow up if symptoms do not improve or worsen   Appended Document: edema//lmr Ven Dopplers pos for DVT- needs hosp for IV heparin... SN

## 2011-01-17 NOTE — Letter (Signed)
Summary: [facility] Progress Note   [facility] Progress Note   Imported By: Roderic Ovens 08/05/2010 15:07:06  _____________________________________________________________________  External Attachment:    Type:   Image     Comment:   External Document

## 2011-01-17 NOTE — Procedures (Signed)
Summary: [facility]CS  [facility]CS   Imported By: Lester Peconic 08/30/2010 12:06:21  _____________________________________________________________________  External Attachment:    Type:   Image     Comment:   External Document

## 2011-01-17 NOTE — Cardiovascular Report (Signed)
Summary: Office Visit Remote   Office Visit Remote   Imported By: Roderic Ovens 03/17/2010 11:47:45  _____________________________________________________________________  External Attachment:    Type:   Image     Comment:   External Document

## 2011-01-17 NOTE — Medication Information (Signed)
Summary: Ipratropium & Albuterol / [facility]  Ipratropium & Albuterol / [facility]   Imported By: Lennie Odor 09/01/2010 16:29:47  _____________________________________________________________________  External Attachment:    Type:   Image     Comment:   External Document

## 2011-01-19 NOTE — Progress Notes (Signed)
Summary: appt 1/131/12 wants to know if she needs labs prior  Phone Note Call from Patient Call back at Home Phone [phone]   Caller: Patient Call For: DR NADEL Summary of Call: Patient phoned stated she has an appointment with Dr. Kriste Basque on 1/131/12 and she wants to know if she is to have lab work before her appointment. She can be reached at [phone] Initial call taken by: Vedia Coffer,  January 09, 2011 11:17 AM  Follow-up for Phone Call        Pt states she wants to know if SN wants her to come in this week for fasting labs. States she usually comes in a week before appointment. Please advise labs and dx codes. Thanks. [name] CMA  January 09, 2011 2:34 PM   per SN: FLP 272.0, bmet 401.9, liver 790.5, cbcd 285.9, tsh 244.9.  called spoke with patient, informed her that her labs are in the computer and she may come any day this week, but must come fasting.  pt verbalized her understanding. [name] CNA/MA  January 09, 2011 5:04 PM

## 2011-01-19 NOTE — Letter (Signed)
Summary: [facility]   Imported By: Lennie Odor 12/02/2010 14:44:28  _____________________________________________________________________  External Attachment:    Type:   Image     Comment:   External Document

## 2011-01-19 NOTE — Letter (Signed)
Summary: CMN for [facility]  CMN for [facility]   Imported By: Sherian Rein 12/28/2010 14:33:33  _____________________________________________________________________  External Attachment:    Type:   Image     Comment:   External Document

## 2011-01-19 NOTE — Letter (Signed)
Summary: [facility]  [facility]   Imported By: Sherian Rein 12/06/2010 15:02:05  _____________________________________________________________________  External Attachment:    Type:   Image     Comment:   External Document

## 2011-01-19 NOTE — Letter (Signed)
Summary: [facility]   Imported By: Lester Moriarty 12/15/2010 08:34:20  _____________________________________________________________________  External Attachment:    Type:   Image     Comment:   External Document

## 2011-01-20 NOTE — Medication Information (Signed)
Summary: [facility]   Imported By: Lester Bret Harte 09/08/2010 10:44:42  _____________________________________________________________________  External Attachment:    Type:   Image     Comment:   External Document

## 2011-01-20 NOTE — Consult Note (Signed)
[facility]: [facility]   Imported By: Sherian Rein 05/02/2010 11:44:02  _____________________________________________________________________  External Attachment:    Type:   Image     Comment:   External Document

## 2011-01-20 NOTE — Letter (Signed)
[facility]   Imported By: Lennie Odor 10/28/2010 10:54:56  _____________________________________________________________________  External Attachment:    Type:   Image     Comment:   External Document

## 2011-01-23 ENCOUNTER — Ambulatory Visit (INDEPENDENT_AMBULATORY_CARE_PROVIDER_SITE_OTHER): Payer: Medicare Other | Admitting: Adult Health

## 2011-01-23 ENCOUNTER — Encounter: Payer: Self-pay | Admitting: Adult Health

## 2011-01-23 DIAGNOSIS — J209 Acute bronchitis, unspecified: Secondary | ICD-10-CM

## 2011-01-24 ENCOUNTER — Telehealth (INDEPENDENT_AMBULATORY_CARE_PROVIDER_SITE_OTHER): Payer: Self-pay | Admitting: *Deleted

## 2011-01-25 NOTE — Assessment & Plan Note (Signed)
Summary: rov 3 months- fast labs-cxr//kp   Primary Care Provider:  Kriste Basque  CC:  3 month ROV & review of mult medical problems....  History of Present Illness: 75 y/o WF followed for mult medical problems...   ~  August 17, 2010:  in early Aug she developed sudden SOB- she thought it was her heart & saw DrTaylor but pacer was OK... went to ER w/ eval revealing bilat PE by V/Q (couldn't do CTA due to Creat=4.2), also had DDimer>20, BNP=1400, Troponin= 0.35, note- ven dopplers were neg for DVT... hx cerebellar hemorrhage (while on Coumadin in past) & Neuro consult said absolute contraindication for Coumadin Rx, therefore had IVC filter placed & transient IV heparin... Fluid management resulted in improved renal function & BNP (Creat=1.14 at end of hosp, & BNP=400)... resp management w/ NEBS etc... she was tx to rehab & disch home 08/06/10> since then c/o weak- getting home PT, and nocturnal O2 qualified...    ~  September 14, 2010:  since I saw her last she has been adm x2 w/ DVT- hosp records reviewed in detail: Adm 9/12-15 & disch on Lovenox once daily but had recurrent leg swelling & pain w/ VenDopplers showing a new DVT & Adm 9/21-25 where Lovenox was incr to Bid... now taking Lovenox 80mg Bid- hypercoag w/u showed sl incr homocystine & DrEnnever rec Folic 1mg /d.... she has severe periph edema & wt up 9# in 40mo> we will incr Lasix to 40mg Qam... she is feeling much better, more mobile, & has a great attitude.   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 much improved now & he plans consult to [facility]... DCSummary reviewed & meds reconcilled> we discussed ROV 2-3  mo w/ CXR & fasting labs.   ~  January 17, 2011:      She is followed by DrEnnever for Heme & he sent her to [facility] (seen 11/11 by DrMoll)>  notes reviewed: recurrent unprovoked thromboembolic dis w/ similar hx in daughter, hx intracranial hemorrhage on warfarin, IVC filter placed & this is unremovable, mod elev beta-2 glycoprot antibody> he rec lifelong Arixtra...  she tells me that recent check by DrEnnever w/ VenDopplers showing ?new clot & she is concerned- still on Arixtra- & he is following very closely.    She remains on Pred 5mg - 1/2 Qod for her hx Sarcoid & CXR stable... recent labs reviewed and everything looks OK.    She is c/o URI w/ hoarseness & sl dry cough, no f/c/s, no sput, no CP/ palpit, etc... we discussed Rx w/ ZPak, Mucinex, MMW.    Problem List:  ALLERGIC RHINITIS (ICD-477.9) - she uses NASONEX & ZYRTEK per DrKozlow...  OBSTRUCTIVE SLEEP APNEA (ICD-327.23) - she is followed by DrClance...  ~  sleep study 2004 showed RDI= 16, desat to 78%, PLMS= 248 w/o sleep disruption... pt opted for diet, weight reduction...  ~  repeat sleep study 8/10 showed mild OSA w/ AHI=15 & desat to 83%... she refused CPAP & opted for the dental appliance- went to [facility] DrEssick w/ mandibular advancement appliance made & she is very happy.  ~  f/u DrClance 7/11 w/ persistant daytime hypersomnolence- he considered repeat sleep study.  SARCOIDOSIS (ICD-135) - Eval for dyspnea revealed evid for pulmonary hypertension and CTChest 4/08 showed +lymphadenopathy... she had a supraclavicular  node biopsy 5/08 by DrStreck that revealed extensive granulomatous inflammation... ACE level= 65, sed= 35, ANA= neg, CBC/ chems= normal... PREDNISONE therapy started  & slowly weaned>>> now at 5mg - 1/2 tab Qod.  ~  PFT's 12/08 showed FVC=2.62 84%), FEV1=1.74 (71%), %1sec=67, mid-flows=33%pred.  ~  CXR 9/09 showed stable chr interstitial markings, no adenopathy, scarring at the bases, pacer, NAD.  ~  labs 9/09 showed ACE= 47... rec decr Pred5  to 1/2 tab Qod.  ~  CT Chest 4/10 showed calcif hilar & mediast nodes- old gran dis, bilat scarring is stable (no  active lung dis), asymmetric osteoarth in sternoclav joint...  ~  CXR 1/11 showed stable scarring & pacer, NAD.  ~  CXR 8/11 showed similar w/ borderline heart size, pacer, prom PA, & scarring.  ~  CXR 1/12 showed stable mild cardiomeg, basilar scarring, pacer, NAD...  PULMONARY HYPERTENSION (ICD-416.8) -  Eval by Cardiology & DrClance 2004- he noted low PVR readings, eventually had sleep study done- see above;  TEE 4/05 showed norm LVF w/ EF=55-65%, no regional wall motion abn, mild MR, mod TR & incr peak pulm sys pressure & norm RV;  etiology believed to be poss combination of sarcoid, obesity, sleep apnea, hx of DVT...  Hx of ASTHMATIC BRONCHITIS, ACUTE (ICD-466.0) - she was dx w/ reflux related airway dis by DrKozlow in 2009...  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  prev on Coumadin & off since cerebellar bleed in 2006... NOTE: she had neg venous dopplers 8/11 adm for PE. VENOUS INSUFFICIENCY (ICD-459.81) - she has VV/ VI and follows a low sodium diet, elevation, support hose, etc...  HYPERTENSION (ICD-401.9) - controlled on METOPROLOL 25mg /d... BP= 130/82 today- denies HA, visual changes, CP, palipit, dizziness, syncope, etc...   CORONARY ARTERY DISEASE (ICD-414.00) - adenosine cardiolite 8/04 showed distal ant & apical infarct w/ mild ischemia & EF=33%;  cardiac cath 9/04 showed min non-obstructive CAD & LVD w/ EF=46%, global HK, 2+MR, PA press 50/24 w/ wedge 20;  TEE 4/05 - see above... Hosp 08/14/23 w/ CP/ SOB/ fatigue- cath showed norm Lmain, 30-40% LAD, 30% ostial CIRC, norm RCA, ant wall HK w/ EF=40%... she had LBBB, high grade AV block, pacer inserted...  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 Hosp 14-Aug-2023 w/ high grade block- pacer placed and pt feeling  better...  ~  last seen in office 9/10 by DrTaylor> gets remote device checks> ?Cards eval during flurry of hospitalizations August 13, 2010?  HYPERLIPIDEMIA (ICD-272.4) - prev on diet alone, intol to Lipitor w/ musc aching, now on PRAVASTATIN 40mg /d...  ~  FLP 8/07 on diet showed TChol 190, TG 131, HDL 31, LDL 133... did not want med Rx.  ~  FLP August 14, 2023 in [place] showed TChol 182, TG 143, HDL 28, LDL 125... Lipitor Rx started.  ~  FLP 12/09 on Lip10 showed TChol 154, TG 117, HDL 37, LDL 93... ch to Prav40 due to musc aching.  ~  FLP 7/10 on Prav40 showed TChol 167, Tg 140, HDL 39, LDL 100  ~  FLP 9/11 showed TChol 143, TG 100, HDL 43, LDL 80  OBESITY (ICD-278.00) - peak weight of 226# in 2009... discussed diet + exercise.  ~  weight 1/10 = 215#  ~  weight 7/10 = 216#  ~  weight  1/11 = 215#... we reviewed diet + exercise program.  ~  weight 8/11 = 200#  ~  weight 9/11 = 209# .[name] Kitchen. she has incr edema after he back to back hosp adms.  ~  weight 1/12 = 192#  Hx of PUD (ICD-533.90) - on OMEPRAZOLE 40mg  Bid...   HEMORRHOIDS (ICD-455.6) - last colonoscopy 11/01 by DrStark was neg x hems; f/u 10 yrs.  RENAL INSUFFICIENCY (ICD-588.9), & UTI (ICD-599.0) - see 8/11 hosp w/ Creat= 4.2 on adm... improved to 1.14 by disch w/ fluid management.  ~  labs 8/11 post disch showed BUN= 15, Creat= 1.1 (off diuretics etc).  ~  labs 9/11 in hosp showed BUN= 11, Creat= 1.1 (back on diuretics for edema).  DEGENERATIVE JOINT DISEASE (ICD-715.90) - c/o neck & shoulder pain- saw DrRowan w/ XRays of neck= "6 spurs" per pt & he rec PT which she says didn't help... therefore went to chiropractor w/ some benefit but short lived betw appts... can't do MRI due to pacer... noticed knot at clavicular head near sternum- tender to palp, no known trauma... XRay & CTscan showed severe degen dis... Rx ETODOLAC.  Hx of STROKE (ICD-434.91) - she developed a lft cerebellar bleed (cerebellar hematoma w/ mild br stem compression) 9/06 while on  coumadin for hx of DVT; no evid of aneurysm; coumadin dc'd; followed by DrWeymann et al...  ~  Neurology reiterated her absolute contraindication to coumadin Rx during 8/11 hosp w/ PE/ DVT...  R/O OTHER CONVULSIONS (ICD-780.39) - eval by DrWeyman & refer to [facility] for unusual episodes of "fear & gait instability w/ fall">> neg ambulatory EEG from DrDeToledo in the Epilepsy Monitoring Unit... 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 she notes occas headaches behind her left ear and in the right temporal area... Tylenol usually helps these pains...  ANXIETY (ICD-300.00) - on Clorazepate 7.5mg  Tid...    Current Medications (verified): 1)  Arixtra 7.5 Mg/0.72ml Soln (Fondaparinux Sodium) .[name] Kitchen.. 1 Injection Daily 2)  Nasonex 50 Mcg/act Susp (Mometasone Furoate) .... 2 Sp in Each Nostril At Bedtime.[name] KitchenMarland Kitchen 3)  Zyrtec Allergy 10 Mg  Tabs (Cetirizine Hcl) .[name] Kitchen.. 1 By Mouth Once Daily 4)  Duoneb 0.5-2.5 (3) Mg/66ml Soln (Ipratropium-Albuterol) .... Inhale 1 Vial in [employer] Two Times A Day 5)  Flovent Hfa 44 Mcg/act Aero (Fluticasone Propionate  Hfa) .... 2 Puffs Two Times A Day... 6)  Prednisone 5 Mg  Tabs (Prednisone) .... 1/2 Tab By Mouth Every Other Day As Directed 7)  Metoprolol Succinate 25 Mg Xr24h-Tab (Metoprolol Succinate) .... Take 1 Tablet By Mouth Once A Day 8)  Furosemide 20 Mg Tabs (Furosemide) .... Take 2 Tablets By Mouth Once A Day 9)  Pravastatin Sodium 40 Mg Tabs (Pravastatin Sodium) .... Take 1 Tablet By Mouth Once A Day 10)  Omeprazole 40 Mg Cpdr (Omeprazole) .... Take 1 Tablet By Mouth Two Times A Day 11)  Senokot 8.6 Mg Tabs (Sennosides) .... 2 Tabs By Mouth At Bedtime 12)  Centrum   Tabs (Multiple Vitamins-Minerals) .[name] Kitchen.. 1 By Mouth Once Daily 13)  Protegra   Caps (Multiple Vitamins-Minerals) .[name] Kitchen.. 1 By Mouth Once Daily 14)  Vitamin D-1000 Max St [id number] Mg-Unit Tabs (Calcium-Vitamin D) .... Once Daily 15)  Folic Acid 1 Mg Tabs (Folic Acid) .... Take 1  Tablet By Mouth Once A Day 16)  Clorazepate Dipotassium 7.5 Mg  Tabs (Clorazepate Dipotassium) .... Take 1 Tab By Mouth Three Times A Day As Needed For Nerves... 17)  Systane 0.4-0.3 %  Soln (Polyethyl Glycol-Propyl Glycol) .... Place 2 Drops in Both Eyes Once Daily  Allergies (verified): 1)  ! Sulfa 2)  ! 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) HYPERTENSION (ICD-401.9) CORONARY ARTERY DISEASE (ICD-414.00) Hx of SYNCOPE (ICD-780.2) CARDIAC PACEMAKER IN SITU (ICD-V45.01) VENOUS INSUFFICIENCY (ICD-459.81) HYPERLIPIDEMIA (ICD-272.4) OBESITY (ICD-278.00) Hx of PUD (ICD-533.90) HEMORRHOIDS (ICD-455.6) RENAL INSUFFICIENCY (ICD-588.9) UTI (ICD-599.0) DEGENERATIVE JOINT DISEASE (ICD-715.90) CERVICALGIA (ICD-723.1) BACK PAIN, LUMBAR (ICD-724.2) Hx of STROKE (ICD-434.91) R/O OTHER CONVULSIONS (ICD-780.39) ANXIETY (ICD-300.00)  Past Surgical History: S/P Appendectomy S/P Cholecystectomy by DrBlievernicht 2/97 S/P Hysterectomy S/P Lumbar Disc Surgery in 1990 S/P Lft Knee Arthroscopy S/P IVC Filter placed 8/11  Family History: Reviewed history from 08/17/2010 and no changes required. Father died age 73 with MI Mother is 20 and in good health 2 siblings: 1 sister died of cancer at age 64 1 brother died of MI at age 78  Social History: Reviewed history from 08/17/2010 and no changes required. Widow 4 children retired non-smoker no alcohol  Review of Systems      See HPI       The patient complains of dyspnea on exertion and peripheral edema.  The patient denies anorexia, fever, weight loss, weight gain, vision loss, decreased hearing, hoarseness, chest pain, syncope, prolonged cough, headaches, hemoptysis,  abdominal pain, melena, hematochezia, severe indigestion/heartburn, hematuria, incontinence, muscle weakness, suspicious skin lesions, transient blindness, difficulty walking, depression, unusual weight change, abnormal bleeding, enlarged lymph nodes, and angioedema.    Vital Signs:  Patient profile:   75 year old female Height:      66 inches Weight:      192.25 pounds BMI:     31.14 O2 Sat:      97 % on Room air Temp:     96.6 degrees F oral Pulse rate:   67 / minute BP sitting:   128 / 80  (left arm) Cuff size:   regular  Vitals Entered By: Carver Fila (January 17, 2011 2:28 PM)  O2 Flow:  Room air CC: 3 month ROV & review of mult medical problems...    Physical Exam  Additional Exam:  WD, Overweight, 75 y/o WF in NAD... she states that she is feeling better. GENERAL:  Alert & oriented; pleasant & cooperative... HEENT:  [place]/AT, EOM-wnl, PERRLA, EACs-clear, TMs-wnl, NOSE- dry MM, THROAT-clear & wnl. NECK:  Supple w/ fair ROM; no JVD; normal carotid impulses w/o bruits; no thyromegaly or nodules palpated; no lymphadenopathy palpated -  scar of prev supraclav LN bx... prominent right clavicular head... CHEST:  Clear to P & A; without wheezes/ rales/ or rhonchi heard... HEART:  Regular rhythm, no murmurs/ rubs/ gallops detected... ABDOMEN:  Obese, soft & nontender; normal bowel sounds; no organomegaly or masses palpated... EXT: without deformities, mild arthritic changes; + ven insuffic and 1+edema- much improved... NEURO:  CN's intact; no focal neuro deficits... DERM:  No lesions noted; no rash etc...    CXR  Procedure date:  01/17/2011  Findings:      CHEST - 2 VIEW 01/17/2011: Comparison: Portable chest x-ray 08/30/2010.  Two-view chest x-ray 08/22/2010 and 09/04/2008.   Findings: Cardiac silhouette mildly enlarged but stable.  Hilar and mediastinal contours  otherwise unremarkable for age.  Left subclavian dual lead transvenous pacemaker unchanged and intact. Minimal  linear scarring in the lingula and the lower lobes, unchanged.  Lungs otherwise clear.  No pleural effusions.  Mild degenerative changes involving the thoracic spine.   IMPRESSION: Stable mild cardiomegaly.  No acute cardiopulmonary disease.   Read By:  Arnell Sieving,  M.D.   MISC. Report  Procedure date:  01/10/2011  Findings:      CBC Platelet w/Diff (CBCD)   White Cell Count          7.0 K/uL                    4.5-10.5   Red Cell Count            4.38 Mil/uL                 3.87-5.11   Hemoglobin                12.5 g/dL                   13.0-86.5   Hematocrit                37.6 %                      36.0-46.0   MCV                       86.0 fl                     78.0-100.0   Platelet Count            168.0 K/uL                  150.0-400.0   Neutrophil %              63.5 %                      43.0-77.0   Lymphocyte %              22.1 %                      12.0-46.0   Monocyte %                11.6 %                      3.0-12.0   Eosinophils%              2.4 %                       0.0-5.0   Basophils %               0.4 %                       0.0-3.0  Lipid Panel (LIPID)   Cholesterol               148 mg/dL                   7-846   Triglycerides             143.0 mg/dL                 9.6-295.2   HDL  41.30 mg/dL                 >04.54   LDL Cholesterol           78 mg/dL                    0-98  BMP (METABOL)   Sodium                    142 mEq/L                   135-145   Potassium                 4.0 mEq/L                   3.5-5.1   Chloride                  103 mEq/L                   96-112   Carbon Dioxide       [H]  33 mEq/L                    19-32   Glucose                   86 mg/dL                    11-91   BUN                       21 mg/dL                    4-78   Creatinine                1.0 mg/dL                   2.9-5.6   Calcium                   9.7 mg/dL                   2.1-30.8   GFR                        61.43 mL/min                >60.00  Comments:      Hepatic/Liver Function Panel (HEPATIC)   Total Bilirubin           0.8 mg/dL                   6.5-7.8   Direct Bilirubin          0.1 mg/dL                   4.6-9.6   Alkaline Phosphatase      56 U/L                      39-117   AST                       22 U/L                      0-37   ALT  15 U/L                      0-35   Total Protein             7.6 g/dL                    1.6-1.0   Albumin                   3.8 g/dL                    9.6-0.4  TSH (TSH)   FastTSH                   2.28 uIU/mL                 0.35-5.50   Impression & Recommendations:  Problem # 1:  SARCOIDOSIS (ICD-135) Stable CXR (labs not done) & stable on Pred 5mg - 1/2 Qod... 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 continue Arixtra.  Problem # 3:  HYPERTENSION (ICD-401.9) BP controlled> same meds. Her updated medication list for this problem includes:    Metoprolol Succinate 25 Mg Xr24h-tab (Metoprolol succinate) .[name] Kitchen... Take 1 tablet by mouth once a day    Furosemide 20 Mg Tabs (Furosemide) .[name] Kitchen... Take 2 tablets by mouth once a day  Problem # 4:  CORONARY ARTERY DISEASE (ICD-414.00) Followed by Lawson Fiscal & Ladona Ridgel... continue same Rx. Her updated medication list for this problem includes:    Metoprolol Succinate 25 Mg Xr24h-tab (Metoprolol succinate) .[name] Kitchen... Take 1 tablet by mouth once a day    Furosemide 20 Mg Tabs (Furosemide) .[name] Kitchen... Take 2 tablets by mouth once a day  Problem # 5:  HYPERLIPIDEMIA (ICD-272.4) Stable on Prav40... Her updated medication list for this problem includes:    Pravastatin Sodium 40 Mg Tabs (Pravastatin sodium) .[name] Kitchen... Take 1 tablet by mouth once a day  Problem # 6:  OBESITY (ICD-278.00) Keep up the good work w/ diet + exerc...  Problem # 7:  DEGENERATIVE JOINT DISEASE (ICD-715.90) Followed by  Linden Dolin al...  Problem # 8:  OTHER MEDICAL PROBLEMS AS NOTED>>> For her URI we discussed Rx w/ ZPak, MMW, Mucinex...  Complete Medication List: 1)  Arixtra 7.5 Mg/0.9ml Soln (Fondaparinux sodium) .[name] Kitchen.. 1 injection daily 2)  Systane 0.4-0.3 % Soln (Polyethyl glycol-propyl glycol) .... Place 2 drops in both eyes once daily 3)  Nasonex 50 Mcg/act Susp (Mometasone furoate) .... 2 sp in each nostril at bedtime.[name] KitchenMarland Kitchen 4)  Zyrtec Allergy 10 Mg Tabs (Cetirizine hcl) .[name] Kitchen.. 1 by mouth once daily 5)  Duoneb 0.5-2.5 (3) Mg/56ml Soln (Ipratropium-albuterol) .... Inhale 1 vial in neb machine two times a day 6)  Flovent Hfa 44 Mcg/act Aero (Fluticasone propionate  hfa) .... 2 puffs two times a day... 7)  Prednisone 5 Mg Tabs (Prednisone) .... 1/2 tab by mouth every other day as directed 8)  Metoprolol Succinate 25 Mg Xr24h-tab (Metoprolol succinate) .... Take 1 tablet by mouth once a day 9)  Furosemide 20 Mg Tabs (Furosemide) .... Take 2 tablets by mouth once a day 10)  Pravastatin Sodium 40 Mg Tabs (Pravastatin sodium) .... Take 1 tablet by mouth once a day 11)  Omeprazole 40 Mg Cpdr (Omeprazole) .... Take 1 tablet by mouth two times a day 12)  Senokot 8.6 Mg Tabs (Sennosides) .... 2 tabs by  mouth at bedtime 13)  Centrum Tabs (Multiple vitamins-minerals) .[name] Kitchen.. 1 by mouth once daily 14)  Protegra Caps (Multiple vitamins-minerals) .[name] Kitchen.. 1 by mouth once daily 15)  Vitamin D-1000 Max St [phone] Mg-unit Tabs (Calcium-vitamin d) .... Once daily 16)  Folic Acid 1 Mg Tabs (Folic acid) .... Take 1 tablet by mouth once a day 17)  Clorazepate Dipotassium 7.5 Mg Tabs (Clorazepate dipotassium) .... Take 1 tab by mouth three times a day as needed for nerves... 18)  Zithromax Z-pak 250 Mg Tabs (Azithromycin) .... Take as directed... 19)  Magic Mouthwash  .... 1 tsp gargle & swallow up to 4 times daily as needed...  Patient Instructions: 1)  Today we updated your med list- see below.... 2)  For your upper resp infection:   take the ZPak as directed;  start on the Mucinex- 2 tabs twice daily w/ plenty of fluids;  use the Magic Mouthwash as needed... 3)  Today we reviewed your f/u CXR & recent blood work (both look good)... 4)  Let's get on track w/ our diet & exercise program> the goal is to lose 10-15 lbs... 5)  Call for any questions.[name] KitchenMarland Kitchen 6)  Please schedule a follow-up appointment in 6 months. Prescriptions: MAGIC MOUTHWASH 1 tsp gargle & swallow up to 4 times daily as needed...  #4 oz x prn   Entered and Authorized by:   Michele Mcalpine MD   Signed by:   Michele Mcalpine MD on 01/17/2011   Method used:   Print then Give to Patient   RxID:   [id number] ZITHROMAX Z-PAK 250 MG TABS (AZITHROMYCIN) take as directed...  #1 pack x 1   Entered and Authorized by:   Michele Mcalpine MD   Signed by:   Michele Mcalpine MD on 01/17/2011   Method used:   Print then Give to Patient   RxID:   [id number]    Immunization History:  Influenza Immunization History:    Influenza:  historical (08/18/2010)  Pneumovax Immunization History:    Pneumovax:  historical (09/17/2008)

## 2011-01-30 ENCOUNTER — Encounter: Payer: Self-pay | Admitting: Internal Medicine

## 2011-01-30 ENCOUNTER — Ambulatory Visit (INDEPENDENT_AMBULATORY_CARE_PROVIDER_SITE_OTHER): Payer: Medicare Other | Admitting: Internal Medicine

## 2011-01-30 DIAGNOSIS — I5022 Chronic systolic (congestive) heart failure: Secondary | ICD-10-CM

## 2011-01-30 DIAGNOSIS — I4891 Unspecified atrial fibrillation: Secondary | ICD-10-CM

## 2011-01-30 DIAGNOSIS — I442 Atrioventricular block, complete: Secondary | ICD-10-CM

## 2011-02-02 NOTE — Assessment & Plan Note (Signed)
Summary: Acute NP office visit - URI   Copy to:  Kriste Basque Primary Provider/Referring Provider:  Kriste Basque  CC:  saw SN 1.31.12 w/ URI, was given zpak and finished this yesterday.  c/o wheezing, SOB, dry cough, and tightness in chest x6days - denies f/c/s.  History of Present Illness: 75 y/o WF w/ hx prob sarcoidosis and mild pulm HTN (based on this, obesity, mild sleep apnea, & hx DVT). & cerebellar bleed on coumadin  Hospitalized by Cardiology 8/09 w/ high grade block & cath showing non-obstructive CAD, HBP & decr LVF= 40%... Med Rx per [facility] & DrTaylor placed pacemaker... he added Lipitor for her Chol but she doesn't like this med "I can't function" muscle aches & he switched her to Pravachol 12/09 (tolerating better so far).  July 22, 2010 5:23 PM  Presented with dyspnea x 3- 4 days, labs show acute renal failure cr 4.0 range, VQ scan shows several perfusion defects. She does not want to take coumadin at any cost. Pacer check ok 8/3.   ~  August 17, 2010:  in early Aug she developed sudden SOB- she thought it was her heart & saw DrTaylor but pacer was OK... went to ER w/ eval revealing bilat PE by V/Q (couldn't do CTA due to Creat=4.2), also had DDimer>20, BNP=1400, Troponin= 0.35, note- ven dopplers were neg for DVT... hx cerebellar hemorrhage (while on Coumadin in past) & Neuro consult said absolute contraindication for Coumadin Rx, therefore had IVC filter placed & transient IV heparin... Fluid management resulted in improved renal function & BNP (Creat=1.14 at end of hosp, & BNP=400)... resp management w/ NEBS etc... she was tx to rehab & disch home 08/06/10> since then c/o weak- getting home PT, and nocturnal O2 qualified... also notes low grade temps to 100+ esp at night- sl dry cough, no sputum, sore throat+mouth, +DOE, & denies GI or GU symptoms... notes occas epistaxis related to the nasal O2... pt ambulated on RA w/ resting HR=100 & Sat=96%> one lap w/ HR=130 & Sat=95%...   **we  decided to check CXR (no acute change), Labs (all improved), UA (+UTI) & Rx empirically w/ Cipro, Tylenol, Nasal Saline, & MMW.  PAGE 2 >>>>>>>>>>>>>>>>>>>>>>>>>> August 29, 2010--Presents for an acute office visit.  Complains of LE edema right leg, states began with a "charlie horse" Friday that resulted in the edema and pain in the right thigh - calf of right leg feels tight - pt tried elastic stockings but couldn't tolerate them - Pt reports still running low grade tep at night - Has 5 doses left of Keflex for recent e.coli UTI. Says she felt pain in thigh/groin area then noticed bruising around this area yesterday. Legs started swelling 2 days ago after she felt pain. Legs feel very tight esp in knee/calf. Pt breathing at baseline w/ no increased dyspnea . NO chest pain or hemoptysis. Denies orthopnea, hemoptysis, fever, n/v/d, headache.  As above pt w/ recent admission for bilateral  PE w/ IVC filter placed during admission last month. She is not a coumadin candidate due to previous cerebellar hemorrhage on coumadin. Call report for doppler shows an extensive clot along the entire right leg w/ extension into left groin.    ~  January 17, 2011:      She is followed by DrEnnever for Heme & he sent her to [facility] (seen 11/11 by DrMoll)> notes reviewed: recurrent unprovoked thromboembolic dis w/ similar hx in daughter, hx intracranial hemorrhage on warfarin, IVC filter placed & this is  unremovable, mod elev beta-2 glycoprot antibody> he rec lifelong Arixtra...  she tells me that recent check by DrEnnever w/ VenDopplers showing ?new clot & she is concerned- still on Arixtra- & he is following very closely.    She remains on Pred 5mg - 1/2 Qod for her hx Sarcoid & CXR stable... recent labs reviewed and everything looks OK.    She is c/o URI w/ hoarseness & sl dry cough, no f/c/s, no sput, no CP/ palpit, etc... we discussed Rx w/ ZPak, Mucinex, MMW.   January 23, 2011 --Presents for persistent symptoms of  URI . Complains of cough and congestion that is not better. Was seen by Dr Kriste Basque 1.31.12 w/ URI, was given zpak and finished this yesterday.  Still has  wheezing, SOB, dry cough, tightness in chest.  Does not feel she is much better. OTC not helping. Of note she is on Arixtra for DVTs .Denies chest pain,  orthopnea, hemoptysis,  n/v/d, edema, headache. CXR last ov with no acute changes      Medications Prior to Update: 1)  Arixtra 7.5 Mg/0.35ml Soln (Fondaparinux Sodium) .[name] Kitchen.. 1 Injection Daily 2)  Systane 0.4-0.3 % Soln (Polyethyl Glycol-Propyl Glycol) .... Place 2 Drops in Both Eyes Once Daily 3)  Nasonex 50 Mcg/act Susp (Mometasone Furoate) .... 2 Sp in Each Nostril At Bedtime.[name] KitchenMarland Kitchen 4)  Zyrtec Allergy 10 Mg  Tabs (Cetirizine Hcl) .[name] Kitchen.. 1 By Mouth Once Daily 5)  Duoneb 0.5-2.5 (3) Mg/101ml Soln (Ipratropium-Albuterol) .... Inhale 1 Vial in [employer] Two Times A Day 6)  Flovent Hfa 44 Mcg/act Aero (Fluticasone Propionate  Hfa) .... 2 Puffs Two Times A Day... 7)  Prednisone 5 Mg  Tabs (Prednisone) .... 1/2 Tab By Mouth Every Other Day As Directed 8)  Metoprolol Succinate 25 Mg Xr24h-Tab (Metoprolol Succinate) .... Take 1 Tablet By Mouth Once A Day 9)  Furosemide 20 Mg Tabs (Furosemide) .... Take 2 Tablets By Mouth Once A Day 10)  Pravastatin Sodium 40 Mg Tabs (Pravastatin Sodium) .... Take 1 Tablet By Mouth Once A Day 11)  Omeprazole 40 Mg Cpdr (Omeprazole) .... Take 1 Tablet By Mouth Two Times A Day 12)  Senokot 8.6 Mg Tabs (Sennosides) .... 2 Tabs By Mouth At Bedtime 13)  Centrum   Tabs (Multiple Vitamins-Minerals) .[name] Kitchen.. 1 By Mouth Once Daily 14)  Protegra   Caps (Multiple Vitamins-Minerals) .[name] Kitchen.. 1 By Mouth Once Daily 15)  Vitamin D-1000 Max St [phone] Mg-Unit Tabs (Calcium-Vitamin D) .... Once Daily 16)  Folic Acid 1 Mg Tabs (Folic Acid) .... Take 1 Tablet By Mouth Once A Day 17)  Clorazepate Dipotassium 7.5 Mg  Tabs (Clorazepate Dipotassium) .... Take 1 Tab By Mouth Three Times A Day As Needed  For Nerves... 18)  Zithromax Z-Pak 250 Mg Tabs (Azithromycin) .... Take As Directed... 19)  Magic Mouthwash .... 1 Tsp Gargle & Swallow Up To 4 Times Daily As Needed...  Current Medications (verified): 1)  Arixtra 7.5 Mg/0.44ml Soln (Fondaparinux Sodium) .[name] Kitchen.. 1 Injection Daily 2)  Systane 0.4-0.3 % Soln (Polyethyl Glycol-Propyl Glycol) .... Place 2 Drops in Both Eyes Once Daily 3)  Nasonex 50 Mcg/act Susp (Mometasone Furoate) .... 2 Sp in Each Nostril At Bedtime.[name] KitchenMarland Kitchen 4)  Zyrtec Allergy 10 Mg  Tabs (Cetirizine Hcl) .[name] Kitchen.. 1 By Mouth Once Daily 5)  Duoneb 0.5-2.5 (3) Mg/59ml Soln (Ipratropium-Albuterol) .... Inhale 1 Vial in [employer] Two Times A Day 6)  Flovent Hfa 44 Mcg/act Aero (Fluticasone Propionate  Hfa) .... 2 Puffs Two Times A Day.[name] KitchenMarland Kitchen  7)  Prednisone 5 Mg  Tabs (Prednisone) .... 1/2 Tab By Mouth Every Other Day As Directed 8)  Metoprolol Succinate 25 Mg Xr24h-Tab (Metoprolol Succinate) .... Take 1 Tablet By Mouth Once A Day 9)  Furosemide 20 Mg Tabs (Furosemide) .... Take 2 Tablets By Mouth Once A Day 10)  Pravastatin Sodium 40 Mg Tabs (Pravastatin Sodium) .... Take 1 Tablet By Mouth Once A Day 11)  Omeprazole 40 Mg Cpdr (Omeprazole) .... Take 1 Tablet By Mouth Two Times A Day 12)  Senokot 8.6 Mg Tabs (Sennosides) .... 2 Tabs By Mouth At Bedtime 13)  Centrum   Tabs (Multiple Vitamins-Minerals) .[name] Kitchen.. 1 By Mouth Once Daily 14)  Protegra   Caps (Multiple Vitamins-Minerals) .[name] Kitchen.. 1 By Mouth Once Daily 15)  Vitamin D-1000 Max St [phone] Mg-Unit Tabs (Calcium-Vitamin D) .... Once Daily 16)  Folic Acid 1 Mg Tabs (Folic Acid) .... Take 1 Tablet By Mouth Once A Day 17)  Clorazepate Dipotassium 7.5 Mg  Tabs (Clorazepate Dipotassium) .... Take 1 Tab By Mouth Three Times A Day As Needed For Nerves... 18)  Magic Mouthwash .... 1 Tsp Gargle & Swallow Up To 4 Times Daily As Needed...  Allergies (verified): 1)  ! Sulfa 2)  ! Imitrex  Past History:  Past Medical History: Last updated:  01/17/2011 ALLERGIC RHINITIS (ICD-477.9) OBSTRUCTIVE SLEEP APNEA (ICD-327.23) SARCOIDOSIS (ICD-135) PULMONARY HYPERTENSION (ICD-416.8) Hx of ASTHMATIC BRONCHITIS, ACUTE (ICD-466.0) PULMONARY EMBOLISM (ICD-415.19) DEEP VENOUS THROMBOPHLEBITIS, LEG, RIGHT (ICD-453.40) HYPERTENSION (ICD-401.9) CORONARY ARTERY DISEASE (ICD-414.00) Hx of SYNCOPE (ICD-780.2) CARDIAC PACEMAKER IN SITU (ICD-V45.01) VENOUS INSUFFICIENCY (ICD-459.81) HYPERLIPIDEMIA (ICD-272.4) OBESITY (ICD-278.00) Hx of PUD (ICD-533.90) HEMORRHOIDS (ICD-455.6) RENAL INSUFFICIENCY (ICD-588.9) UTI (ICD-599.0) DEGENERATIVE JOINT DISEASE (ICD-715.90) CERVICALGIA (ICD-723.1) BACK PAIN, LUMBAR (ICD-724.2) Hx of STROKE (ICD-434.91) R/O OTHER CONVULSIONS (ICD-780.39) ANXIETY (ICD-300.00)  Past Surgical History: Last updated: 01/17/2011 S/P Appendectomy S/P Cholecystectomy by DrBlievernicht 2/97 S/P Hysterectomy S/P Lumbar Disc Surgery in 1990 S/P Lft Knee Arthroscopy S/P IVC Filter placed 8/11  Family History: Last updated: 31-Aug-2010 Father died age 78 with MI Mother is 65 and in good health 2 siblings: 1 sister died of cancer at age 84 1 brother died of MI at age 14  Social History: Last updated: 08-31-10 Widow 4 children retired non-smoker no alcohol  Risk Factors: Smoking Status: never (09/14/2010)  Vital Signs:  Patient profile:   75 year old female Height:      66 inches Weight:      190.50 pounds BMI:     30.86 O2 Sat:      97 % on Room air Temp:     97.5 degrees F oral Pulse rate:   95 / minute BP sitting:   132 / 80  Vitals Entered By: [name] CNA/MA (January 23, 2011 10:16 AM)  O2 Flow:  Room air CC: saw SN 1.31.12 w/ URI, was given zpak and finished this yesterday.  c/o wheezing, SOB, dry cough, tightness in chest x6days - denies f/c/s Is Patient Diabetic? No Comments Medications reviewed with patient Daytime contact number verified with patient. [name] CNA/MA  January 23, 2011 10:16 AM    Physical Exam  Additional Exam:  GEN: A/Ox3; pleasant , NAD HEENT:  [place]/AT, , EACs-clear, TMs-wnl, NOSE-clear, THROAT-clear draingae  NECK:  Supple w/ fair ROM; no JVD; normal carotid impulses w/o bruits; no thyromegaly or nodules palpated; no lymphadenopathy. RESP  Coarse BS w/no wheeizng  CARD:  RRR, no m/r/g   GI:   Soft & nt; nml bowel sounds; no  organomegaly or masses detected. Musco: Warm bil,  venous insufficiency changes, pulses intact,  Neuro: intact w/ no focal deficits noted.  Skin: intact, no lesions or rash noted.    Impression & Recommendations:  Problem # 1:  Hx of ASTHMATIC BRONCHITIS, ACUTE (ICD-466.0)  Slow to resolve flare Plan: Augmentin 875mg  two times a day for 7days Mucinex DM two times a day as needed cough/congestion Prednisone 10mg  4 tabs for 2 days, then 3 tabs for 2 days, 2 tabs for 2 days, then 1 tab for 2 days, then 1/2 once daily x 2 days then back to your normal prednisone dose of 5mg  1/2 every other day  Increase fluids and rest  Please contact office for sooner follow up if symptoms do not improve or worsen  follow up Dr. Kriste Basque as planned and as needed  The following medications were removed from the medication list:    Zithromax Z-pak 250 Mg Tabs (Azithromycin) .[name] Kitchen... Take as directed... Her updated medication list for this problem includes:    Duoneb 0.5-2.5 (3) Mg/70ml Soln (Ipratropium-albuterol) ..... Inhale 1 vial in neb machine two times a day    Flovent Hfa 44 Mcg/act Aero (Fluticasone propionate  hfa) .[name] Kitchen... 2 puffs two times a day...    Augmentin 875-125 Mg Tabs (Amoxicillin-pot clavulanate) .[name] Kitchen... 1 by mouth two times a day  Orders: Est. Patient Level IV (40981)  Medications Added to Medication List This Visit: 1)  Augmentin 875-125 Mg Tabs (Amoxicillin-pot clavulanate) .[name] Kitchen.. 1 by mouth two times a day 2)  Prednisone 10 Mg Tabs (Prednisone) .... 4 tabs for 2 days, then 3 tabs for 2 days, 2 tabs for 2 days, then 1 tab  for 2 days, then 1/2 once daily x2 days then back to regular dose  Patient Instructions: 1)  Augmentin 875mg  two times a day for 7days 2)  Mucinex DM two times a day as needed cough/congestion 3)  Prednisone 10mg  4 tabs for 2 days, then 3 tabs for 2 days, 2 tabs for 2 days, then 1 tab for 2 days, then 1/2 once daily x 2 days then back to your normal prednisone dose of 5mg  1/2 every other day  4)  Increase fluids and rest  5)  Please contact office for sooner follow up if symptoms do not improve or worsen  6)  follow up Dr. Kriste Basque as planned and as needed  Prescriptions: PREDNISONE 10 MG TABS (PREDNISONE) 4 tabs for 2 days, then 3 tabs for 2 days, 2 tabs for 2 days, then 1 tab for 2 days, then 1/2 once daily x2 days then back to regular dose  #22 x 0   Entered and Authorized by:   Rubye Oaks NP   Signed by:   Tammy Parrett NP on 01/23/2011   Method used:   Electronically to        [id number] (retail)       [address]       [place], [place]  [address]       Ph: [phone] or [phone]       Fax: [phone]   RxID:   [id number] AUGMENTIN 875-125 MG TABS (AMOXICILLIN-POT CLAVULANATE) 1 by mouth two times a day  #14 x 0   Entered and Authorized by:   Rubye Oaks NP   Signed by:   Tammy Parrett NP on 01/23/2011   Method used:   Electronically to        [id number] (retail)  [address]       [place], [place]  [address]       Ph: [phone] or [phone]       Fax: [phone]   RxID:   [id number]

## 2011-02-02 NOTE — Progress Notes (Signed)
Summary: Cough medicine  Phone Note Call from Patient Call back at Home Phone [phone]   Caller: Patient Call For: parrett Summary of Call: Patient wants prescription for cough medicine to take at night (recommended by T Parrett yesterday).  [facility] [address] Initial call taken by: Leonette Monarch,  January 24, 2011 10:45 AM  Follow-up for Phone Call        called and spoke with pt.  pt states she just saw TP yesterday 01-23-2011 and TP stated she was going to call in something for pt's cough (cough syrup?).  Pt states her cough is esp. worse at night and is requesting rx for this.  Please advise.  Thanks.  Aundra Millet Reynolds LPN  January 24, 2011 10:48 AM    Additional Follow-up for Phone Call Additional follow up Details #1::        hydromet 1-2 tsp every 4-6 hr as needed cough, may make you sleepy #8 oz , no refills.   Additional Follow-up by: Rubye Oaks NP,  January 24, 2011 10:54 AM    Additional Follow-up for Phone Call Additional follow up Details #2::    PT informed that cough med was sent to pharmacy. Abigail Miyamoto RN  January 24, 2011 11:48 AM   New/Updated Medications: HYDROMET 5-1.5 MG/5ML SYRP (HYDROCODONE-HOMATROPINE) Take 1 to 2 tsp every 4 - 6 hrs as needed for cough Prescriptions: HYDROMET 5-1.5 MG/5ML SYRP (HYDROCODONE-HOMATROPINE) Take 1 to 2 tsp every 4 - 6 hrs as needed for cough  #8oz x 0   Entered by:   Abigail Miyamoto RN   Authorized by:   Rubye Oaks NP   Signed by:   Abigail Miyamoto RN on 01/24/2011   Method used:   Telephoned to ...       [id number] (retail)       [address]       [place], [place]  [address]       Ph: [phone] or [phone]       Fax: [phone]   RxID:   [id number]

## 2011-02-03 ENCOUNTER — Telehealth: Payer: Self-pay | Admitting: Adult Health

## 2011-02-06 ENCOUNTER — Other Ambulatory Visit: Payer: Self-pay | Admitting: Hematology & Oncology

## 2011-02-06 ENCOUNTER — Ambulatory Visit (HOSPITAL_BASED_OUTPATIENT_CLINIC_OR_DEPARTMENT_OTHER)
Admission: RE | Admit: 2011-02-06 | Discharge: 2011-02-06 | Disposition: A | Discharge status: Home / Self Care | Payer: Medicare Other | Source: Ambulatory Visit | Attending: Hematology & Oncology | Admitting: Hematology & Oncology

## 2011-02-06 ENCOUNTER — Encounter (HOSPITAL_BASED_OUTPATIENT_CLINIC_OR_DEPARTMENT_OTHER): Payer: Medicare Other | Admitting: Hematology & Oncology

## 2011-02-06 DIAGNOSIS — I82409 Acute embolism and thrombosis of unspecified deep veins of unspecified lower extremity: Secondary | ICD-10-CM

## 2011-02-06 DIAGNOSIS — I82819 Embolism and thrombosis of superficial veins of unspecified lower extremities: Secondary | ICD-10-CM | POA: Insufficient documentation

## 2011-02-06 DIAGNOSIS — I824Y9 Acute embolism and thrombosis of unspecified deep veins of unspecified proximal lower extremity: Secondary | ICD-10-CM

## 2011-02-06 DIAGNOSIS — M79609 Pain in unspecified limb: Secondary | ICD-10-CM | POA: Insufficient documentation

## 2011-02-06 LAB — CBC WITH DIFFERENTIAL (CANCER CENTER ONLY)
BASO#: 0 10*3/uL (ref 0.0–0.2)
Eosinophils Absolute: 0.2 10*3/uL (ref 0.0–0.5)
HCT: 39.1 % (ref 34.8–46.6)
HGB: 12.9 g/dL (ref 11.6–15.9)
LYMPH#: 1.7 10*3/uL (ref 0.9–3.3)
MCHC: 33 g/dL (ref 32.0–36.0)
MONO#: 0.8 10*3/uL (ref 0.1–0.9)
NEUT%: 67.2 % (ref 39.6–80.0)
RBC: 4.52 10*6/uL (ref 3.70–5.32)

## 2011-02-08 LAB — HEPARIN ANTI-XA: Heparin LMW: 1.72 IU/mL

## 2011-02-08 LAB — D-DIMER, QUANTITATIVE: D-Dimer, Quant: 0.89 ug/mL-FEU — ABNORMAL HIGH (ref 0.00–0.48)

## 2011-02-08 NOTE — Assessment & Plan Note (Signed)
Summary: f9m/rs from [facility]  Medications Added OXYCODONE HCL 5 MG TABS (OXYCODONE HCL) every 6 hrs as needed      Allergies Added:   Visit Type:  PPM-[employer] Referring Provider:  Kriste Basque Primary Provider:  Kriste Basque  CC:  shortness of breath.  History of Present Illness: [name] [name] returned today for followup.  She is a very pleasant woman with a history of complete heart block and hypertension.  She previously had left bundle-branch block.  She has not had any significant obstructive coronary disease.  She returns today for followup. Over the past year she has struggled with recurrent PE's in the setting of a prior hemorrhagic stroke.  She remains on Fondaparinux.  She denies c/p but does have sob with exertion.  She continues to have occaisional arthritic complaints.   Current Medications (verified): 1)  Arixtra 7.5 Mg/0.67ml Soln (Fondaparinux Sodium) .[name] Kitchen.. 1 Injection Daily 2)  Systane 0.4-0.3 % Soln (Polyethyl Glycol-Propyl Glycol) .... Place 2 Drops in Both Eyes Once Daily 3)  Nasonex 50 Mcg/act Susp (Mometasone Furoate) .... 2 Sp in Each Nostril At Bedtime.[name] KitchenMarland Kitchen 4)  Zyrtec Allergy 10 Mg  Tabs (Cetirizine Hcl) .[name] Kitchen.. 1 By Mouth Once Daily 5)  Duoneb 0.5-2.5 (3) Mg/15ml Soln (Ipratropium-Albuterol) .... Inhale 1 Vial in [employer] Two Times A Day 6)  Flovent Hfa 44 Mcg/act Aero (Fluticasone Propionate  Hfa) .... 2 Puffs Two Times A Day... 7)  Prednisone 5 Mg  Tabs (Prednisone) .... 1/2 Tab By Mouth Every Other Day As Directed 8)  Metoprolol Succinate 25 Mg Xr24h-Tab (Metoprolol Succinate) .... Take 1 Tablet By Mouth Once A Day 9)  Furosemide 20 Mg Tabs (Furosemide) .... Take 2 Tablets By Mouth Once A Day 10)  Pravastatin Sodium 40 Mg Tabs (Pravastatin Sodium) .... Take 1 Tablet By Mouth Once A Day 11)  Omeprazole 40 Mg Cpdr (Omeprazole) .... Take 1 Tablet By Mouth Two Times A Day 12)  Senokot 8.6 Mg Tabs (Sennosides) .... 2 Tabs By Mouth At Bedtime 13)  Centrum   Tabs (Multiple  Vitamins-Minerals) .[name] Kitchen.. 1 By Mouth Once Daily 14)  Protegra   Caps (Multiple Vitamins-Minerals) .[name] Kitchen.. 1 By Mouth Once Daily 15)  Vitamin D-1000 Max St [phone] Mg-Unit Tabs (Calcium-Vitamin D) .... Once Daily 16)  Folic Acid 1 Mg Tabs (Folic Acid) .... Take 1 Tablet By Mouth Once A Day 17)  Clorazepate Dipotassium 7.5 Mg  Tabs (Clorazepate Dipotassium) .... Take 1 Tab By Mouth Three Times A Day As Needed For Nerves... 18)  Magic Mouthwash .... 1 Tsp Gargle & Swallow Up To 4 Times Daily As Needed... 19)  Augmentin 875-125 Mg Tabs (Amoxicillin-Pot Clavulanate) .[name] Kitchen.. 1 By Mouth Two Times A Day 20)  Prednisone 10 Mg Tabs (Prednisone) .... 4 Tabs For 2 Days, Then 3 Tabs For 2 Days, 2 Tabs For 2 Days, Then 1 Tab For 2 Days, Then 1/2 Once Daily X2 Days Then Back To Regular Dose 21)  Hydromet 5-1.5 Mg/79ml Syrp (Hydrocodone-Homatropine) .... Take 1 To 2 Tsp Every 4 - 6 Hrs As Needed For Cough 22)  Oxycodone Hcl 5 Mg Tabs (Oxycodone Hcl) .... Every 6 Hrs As Needed  Allergies (verified): 1)  ! Sulfa 2)  ! Imitrex  Past History:  Past Medical History: Last updated: 01/17/2011 ALLERGIC RHINITIS (ICD-477.9) OBSTRUCTIVE SLEEP APNEA (ICD-327.23) SARCOIDOSIS (ICD-135) PULMONARY HYPERTENSION (ICD-416.8) Hx of ASTHMATIC BRONCHITIS, ACUTE (ICD-466.0) PULMONARY EMBOLISM (ICD-415.19) DEEP VENOUS THROMBOPHLEBITIS, LEG, RIGHT (ICD-453.40) HYPERTENSION (ICD-401.9) CORONARY ARTERY DISEASE (ICD-414.00) Hx of SYNCOPE (ICD-780.2) CARDIAC PACEMAKER IN SITU (  ICD-V45.01) VENOUS INSUFFICIENCY (ICD-459.81) HYPERLIPIDEMIA (ICD-272.4) OBESITY (ICD-278.00) Hx of PUD (ICD-533.90) HEMORRHOIDS (ICD-455.6) RENAL INSUFFICIENCY (ICD-588.9) UTI (ICD-599.0) DEGENERATIVE JOINT DISEASE (ICD-715.90) CERVICALGIA (ICD-723.1) BACK PAIN, LUMBAR (ICD-724.2) Hx of STROKE (ICD-434.91) R/O OTHER CONVULSIONS (ICD-780.39) ANXIETY (ICD-300.00)  Past Surgical History: Last updated: 01/17/2011 S/P Appendectomy S/P Cholecystectomy  by DrBlievernicht 2/97 S/P Hysterectomy S/P Lumbar Disc Surgery in 1990 S/P Lft Knee Arthroscopy S/P IVC Filter placed 8/11  Review of Systems       The patient complains of chest pain and dyspnea on exertion.  The patient denies syncope and peripheral edema.    Vital Signs:  Patient profile:   75 year old female Height:      66 inches Weight:      191.75 pounds BMI:     31.06 Pulse rate:   88 / minute BP sitting:   144 / 86  (left arm) Cuff size:   regular  Vitals Entered By: Caralee Ates CMA (January 30, 2011 12:36 PM)  Physical Exam  General:  ow female in nad Head:  normocephalic and atraumatic Eyes:  PERRLA/EOM intact; conjunctiva and lids normal. Mouth:  no signficant elongation of soft palate or uvula Neck:  Neck supple, no JVD. No masses, thyromegaly or abnormal cervical nodes. Chest Wall:  Well healed PPM incision. Lungs:  clear to auscultation Heart:  rrr with no murmurs, rubs or gallops. Abdomen:  Bowel sounds positive; abdomen soft and non-tender without masses, organomegaly, or hernias noted. No hepatosplenomegaly. Msk:  Back normal, normal gait. Muscle strength and tone normal. Pulses:  pulses normal in all 4 extremities Extremities:  mild edema, no cyanosis Neurologic:  alert, does not appear sleepy, moves all 4.   PPM Specifications Following MD:  Lewayne Bunting, MD     PPM Vendor:  [employer]     PPM Model Number:  [id number]     PPM Serial Number:  [id number] PPM DOI:  08/04/2008     PPM Implanting MD:  Lewayne Bunting, MD  Lead 1    Location: [facility]     DOI: 08/04/2008     Model #: [id number]     Serial #: [id number]     Status: active Lead 2    Location: RV     DOI: 08/04/2008     Model #: [id number]     Serial #: [id number]     Status: active  Magnet Response Rate:  BOL 85 ERI 65  Indications:  Complete heart block   PPM Follow Up Pacer Dependent:  Yes      Episodes Coumadin:  No  Parameters Mode:  DDDR     Lower Rate Limit:  60     Upper Rate Limit:  130 Paced  AV Delay:  170     Sensed AV Delay:  150 MD Comments:  Normal device function.  Impression & Recommendations:  Problem # 1:  CARDIAC PACEMAKER IN SITU (ICD-V45.01) Her device is working normally. Will recheck in several months.  Problem # 2:  PULMONARY EMBOLISM (ICD-415.19) She continues on her meds as below. Will follow. Her updated medication list for this problem includes:    Arixtra 7.5 Mg/0.91ml Soln (Fondaparinux sodium) .[name] Kitchen... 1 injection daily  Problem # 3:  HYPERTENSION (ICD-401.9) Her blood pressure is well controlled. Will follow. A low sodium diet is requested. Her updated medication list for this problem includes:    Metoprolol Succinate 25 Mg Xr24h-tab (Metoprolol succinate) .[name] Kitchen... Take 1 tablet by mouth once a day    Furosemide 20 Mg Tabs (  Furosemide) .[name] Kitchen... Take 2 tablets by mouth once a day  Patient Instructions: 1)  Your physician wants you to follow-up in: 6 months with Dr Court Joy will receive a reminder letter in the mail two months in advance. If you don't receive a letter, please call our office to schedule the follow-up appointment. 2)  Your physician recommends that you continue on your current medications as directed. Please refer to the Current Medication list given to you today.

## 2011-02-08 NOTE — Progress Notes (Signed)
Summary: SOB / cough  Phone Note Call from Patient Call back at [facility] Phone [phone]   Caller: Patient Call For: parrett Summary of Call: pt still coughing and having SOB. she is on O2 at night. wants to know if she should use this during the day. has taken all the meds prescribed recently for same. [facility] [address] Initial call taken by: Tivis Ringer, CNA,  February 03, 2011 8:58 AM  Follow-up for Phone Call        Pt c/o feeling like she has a "lump in throat", dry hacky cough, breathing is not better still has DOE. Has finished abx, Prednisone, Mucinex. Still have Hydromet, and Magic Mouthwash on hand. Cough syrup is allowing her to get some rest at night. Is using O2 at night. Please advise.  Also pt states she was drinking a quart of Gatorade and then a quart of water and was told by Dr. Ladona Ridgel that was too much. She now only drinks fluids when she eats: 8oz (hot coco or hot apple cider) for breakfast, 12oz (surgar free soda) for lunch, and 16oz tea for dinner. Pt states instructions for last OV with TP was to drink plenty of fluids, now wants to know how much is too much daily fluid intake. Please advise. Thanks [name] CMA  February 03, 2011 11:04 AM   Additional Follow-up for Phone Call Additional follow up Details #1::        just meant to drink adequate hydration glasses daily of healthy fluid--water.  may need to see Dr. Kriste Basque or me back  has been on 2 abx seen in office and had prednisone.  cont same meds if not improving  ov Please contact office for sooner follow up if symptoms do not improve or worsen  Additional Follow-up by: Rubye Oaks NP,  February 03, 2011 11:19 AM    Additional Follow-up for Phone Call Additional follow up Details #2::    called and spoke with pt and explained to pt that per TP just hydration fluids---pt stated that she understood this---she will try the mucinex again over the weekend and if she is not any better on monday she will  call for an appt. Randell [facility]  February 03, 2011 2:14 PM

## 2011-02-14 NOTE — Cardiovascular Report (Signed)
Summary: Office Visit   Office Visit   Imported By: Roderic Ovens 02/08/2011 15:47:59  _____________________________________________________________________  External Attachment:    Type:   Image     Comment:   External Document

## 2011-03-01 LAB — CBC
HCT: 30.6 % — ABNORMAL LOW (ref 36.0–46.0)
HCT: 31.8 % — ABNORMAL LOW (ref 36.0–46.0)
HCT: 32.1 % — ABNORMAL LOW (ref 36.0–46.0)
HCT: 32.6 % — ABNORMAL LOW (ref 36.0–46.0)
HCT: 34.6 % — ABNORMAL LOW (ref 36.0–46.0)
Hemoglobin: 10.8 g/dL — ABNORMAL LOW (ref 12.0–15.0)
Hemoglobin: 9.7 g/dL — ABNORMAL LOW (ref 12.0–15.0)
Hemoglobin: 9.9 g/dL — ABNORMAL LOW (ref 12.0–15.0)
Hemoglobin: 9.9 g/dL — ABNORMAL LOW (ref 12.0–15.0)
MCH: 26.6 pg (ref 26.0–34.0)
MCH: 27.2 pg (ref 26.0–34.0)
MCH: 27.4 pg (ref 26.0–34.0)
MCH: 27.4 pg (ref 26.0–34.0)
MCH: 27.7 pg (ref 26.0–34.0)
MCH: 27.7 pg (ref 26.0–34.0)
MCHC: 30.5 g/dL (ref 30.0–36.0)
MCHC: 30.5 g/dL (ref 30.0–36.0)
MCHC: 30.5 g/dL (ref 30.0–36.0)
MCHC: 30.7 g/dL (ref 30.0–36.0)
MCHC: 30.8 g/dL (ref 30.0–36.0)
MCHC: 31.2 g/dL (ref 30.0–36.0)
MCV: 87.4 fL (ref 78.0–100.0)
MCV: 88.2 fL (ref 78.0–100.0)
MCV: 88.4 fL (ref 78.0–100.0)
MCV: 89 fL (ref 78.0–100.0)
MCV: 89.7 fL (ref 78.0–100.0)
MCV: 91 fL (ref 78.0–100.0)
Platelets: 227 10*3/uL (ref 150–400)
Platelets: 229 10*3/uL (ref 150–400)
Platelets: 252 10*3/uL (ref 150–400)
Platelets: 266 10*3/uL (ref 150–400)
Platelets: 273 10*3/uL (ref 150–400)
RBC: 3.57 MIL/uL — ABNORMAL LOW (ref 3.87–5.11)
RBC: 3.69 MIL/uL — ABNORMAL LOW (ref 3.87–5.11)
RBC: 3.8 MIL/uL — ABNORMAL LOW (ref 3.87–5.11)
RDW: 15.6 % — ABNORMAL HIGH (ref 11.5–15.5)
RDW: 15.6 % — ABNORMAL HIGH (ref 11.5–15.5)
RDW: 15.7 % — ABNORMAL HIGH (ref 11.5–15.5)
RDW: 15.7 % — ABNORMAL HIGH (ref 11.5–15.5)
RDW: 16 % — ABNORMAL HIGH (ref 11.5–15.5)
WBC: 8 10*3/uL (ref 4.0–10.5)

## 2011-03-01 LAB — DIFFERENTIAL
Basophils Absolute: 0 10*3/uL (ref 0.0–0.1)
Basophils Absolute: 0 10*3/uL (ref 0.0–0.1)
Basophils Relative: 0 % (ref 0–1)
Eosinophils Absolute: 0.1 10*3/uL (ref 0.0–0.7)
Eosinophils Relative: 2 % (ref 0–5)
Lymphocytes Relative: 26 % (ref 12–46)
Lymphs Abs: 1.2 10*3/uL (ref 0.7–4.0)
Lymphs Abs: 1.4 10*3/uL (ref 0.7–4.0)
Neutro Abs: 2.9 10*3/uL (ref 1.7–7.7)
Neutrophils Relative %: 68 % (ref 43–77)

## 2011-03-01 LAB — COMPREHENSIVE METABOLIC PANEL
ALT: 22 U/L (ref 0–35)
AST: 33 U/L (ref 0–37)
Alkaline Phosphatase: 55 U/L (ref 39–117)
BUN: 11 mg/dL (ref 6–23)
CO2: 28 mEq/L (ref 19–32)
CO2: 32 mEq/L (ref 19–32)
Calcium: 8.7 mg/dL (ref 8.4–10.5)
Calcium: 9.1 mg/dL (ref 8.4–10.5)
Chloride: 103 mEq/L (ref 96–112)
Chloride: 104 mEq/L (ref 96–112)
Creatinine, Ser: 0.99 mg/dL (ref 0.4–1.2)
GFR calc Af Amer: >60 mL/min (ref >60)
GFR calc non Af Amer: 55 mL/min — ABNORMAL LOW (ref >60)
GFR calc non Af Amer: 55 mL/min — ABNORMAL LOW (ref >60)
Glucose, Bld: 127 mg/dL — ABNORMAL HIGH (ref 70–99)
Sodium: 141 mEq/L (ref 135–145)
Total Bilirubin: 0.3 mg/dL (ref 0.3–1.2)
Total Bilirubin: 0.6 mg/dL (ref 0.3–1.2)

## 2011-03-01 LAB — BASIC METABOLIC PANEL
BUN: 10 mg/dL (ref 6–23)
CO2: 33 mEq/L — ABNORMAL HIGH (ref 19–32)
Calcium: 8.7 mg/dL (ref 8.4–10.5)
Creatinine, Ser: 1.01 mg/dL (ref 0.4–1.2)
Glucose, Bld: 94 mg/dL (ref 70–99)

## 2011-03-01 LAB — URINALYSIS, ROUTINE W REFLEX MICROSCOPIC
Bilirubin Urine: NEGATIVE
Hgb urine dipstick: NEGATIVE
Ketones, ur: NEGATIVE mg/dL
Nitrite: NEGATIVE
Specific Gravity, Urine: 1.018 (ref 1.005–1.030)
Urobilinogen, UA: 0.2 mg/dL (ref 0.0–1.0)

## 2011-03-01 LAB — MAGNESIUM: Magnesium: 2.4 mg/dL (ref 1.5–2.5)

## 2011-03-01 LAB — URINE CULTURE: Colony Count: 75000

## 2011-03-01 LAB — APTT
aPTT: 54 seconds — ABNORMAL HIGH (ref 24–37)
aPTT: 61 seconds — ABNORMAL HIGH (ref 24–37)
aPTT: 62 seconds — ABNORMAL HIGH (ref 24–37)
aPTT: 71 seconds — ABNORMAL HIGH (ref 24–37)

## 2011-03-01 LAB — PROTIME-INR
INR: 0.93 (ref 0.00–1.49)
INR: 1.02 (ref 0.00–1.49)
Prothrombin Time: 12.7 seconds (ref 11.6–15.2)
Prothrombin Time: 13.6 seconds (ref 11.6–15.2)

## 2011-03-01 LAB — URINE MICROSCOPIC-ADD ON

## 2011-03-01 LAB — PHOSPHORUS: Phosphorus: 3.7 mg/dL (ref 2.3–4.6)

## 2011-03-02 LAB — BASIC METABOLIC PANEL
BUN: 9 mg/dL (ref 6–23)
BUN: 9 mg/dL (ref 6–23)
BUN: 17 mg/dL (ref 6–23)
CO2: 31 mEq/L (ref 19–32)
CO2: 23 mEq/L (ref 19–32)
CO2: 26 mEq/L (ref 19–32)
CO2: 31 mEq/L (ref 19–32)
Calcium: 9.1 mg/dL (ref 8.4–10.5)
Calcium: 8.2 mg/dL — ABNORMAL LOW (ref 8.4–10.5)
Chloride: 100 mEq/L (ref 96–112)
Chloride: 103 mEq/L (ref 96–112)
Chloride: 103 mEq/L (ref 96–112)
Creatinine, Ser: 1.02 mg/dL (ref 0.4–1.2)
Creatinine, Ser: 1.12 mg/dL (ref 0.4–1.2)
Creatinine, Ser: 1.14 mg/dL (ref 0.4–1.2)
GFR calc Af Amer: >60 mL/min (ref >60)
GFR calc Af Amer: 57 mL/min — ABNORMAL LOW (ref >60)
GFR calc non Af Amer: 48 mL/min — ABNORMAL LOW (ref >60)
GFR calc non Af Amer: 49 mL/min — ABNORMAL LOW (ref >60)
GFR calc non Af Amer: 47 mL/min — ABNORMAL LOW (ref >60)
Glucose, Bld: 100 mg/dL — ABNORMAL HIGH (ref 70–99)
Glucose, Bld: 107 mg/dL — ABNORMAL HIGH (ref 70–99)
Glucose, Bld: 120 mg/dL — ABNORMAL HIGH (ref 70–99)
Glucose, Bld: 108 mg/dL — ABNORMAL HIGH (ref 70–99)
Glucose, Bld: 120 mg/dL — ABNORMAL HIGH (ref 70–99)
Potassium: 4.2 mEq/L (ref 3.5–5.1)
Potassium: 3.8 mEq/L (ref 3.5–5.1)
Potassium: 4.2 mEq/L (ref 3.5–5.1)
Sodium: 141 mEq/L (ref 135–145)
Sodium: 135 mEq/L (ref 135–145)
Sodium: 138 mEq/L (ref 135–145)
Sodium: 140 mEq/L (ref 135–145)

## 2011-03-02 LAB — DIFFERENTIAL
Basophils Absolute: 0 10*3/uL (ref 0.0–0.1)
Basophils Absolute: 0.1 10*3/uL (ref 0.0–0.1)
Basophils Absolute: 0.1 10*3/uL (ref 0.0–0.1)
Basophils Relative: 1 % (ref 0–1)
Basophils Relative: 0 % (ref 0–1)
Basophils Relative: 0 % (ref 0–1)
Eosinophils Relative: 6 % — ABNORMAL HIGH (ref 0–5)
Lymphocytes Relative: 18 % (ref 12–46)
Lymphocytes Relative: 18 % (ref 12–46)
Lymphs Abs: 1.6 10*3/uL (ref 0.7–4.0)
Lymphs Abs: 1.6 10*3/uL (ref 0.7–4.0)
Monocytes Absolute: 1.2 10*3/uL — ABNORMAL HIGH (ref 0.1–1.0)
Monocytes Absolute: 1.6 10*3/uL — ABNORMAL HIGH (ref 0.1–1.0)
Monocytes Relative: 13 % — ABNORMAL HIGH (ref 3–12)
Monocytes Relative: 14 % — ABNORMAL HIGH (ref 3–12)
Monocytes Relative: 11 % (ref 3–12)
Monocytes Relative: 13 % — ABNORMAL HIGH (ref 3–12)
Neutro Abs: 5.6 10*3/uL (ref 1.7–7.7)
Neutro Abs: 5.6 10*3/uL (ref 1.7–7.7)
Neutro Abs: 10.5 10*3/uL — ABNORMAL HIGH (ref 1.7–7.7)
Neutro Abs: 11.2 10*3/uL — ABNORMAL HIGH (ref 1.7–7.7)
Neutrophils Relative %: 63 % (ref 43–77)
Neutrophils Relative %: 76 % (ref 43–77)
Neutrophils Relative %: 79 % — ABNORMAL HIGH (ref 43–77)

## 2011-03-02 LAB — COMPREHENSIVE METABOLIC PANEL
ALT: 25 U/L (ref 0–35)
AST: 35 U/L (ref 0–37)
Alkaline Phosphatase: 58 U/L (ref 39–117)
BUN: 15 mg/dL (ref 6–23)
CO2: 31 mEq/L (ref 19–32)
Calcium: 9.3 mg/dL (ref 8.4–10.5)
GFR calc Af Amer: >60 mL/min (ref >60)
GFR calc non Af Amer: 59 mL/min — ABNORMAL LOW (ref >60)
Glucose, Bld: 127 mg/dL — ABNORMAL HIGH (ref 70–99)
Potassium: 3.9 mEq/L (ref 3.5–5.1)
Sodium: 137 mEq/L (ref 135–145)
Total Protein: 7.8 g/dL (ref 6.0–8.3)
Total Protein: 7.5 g/dL (ref 6.0–8.3)

## 2011-03-02 LAB — URINALYSIS, ROUTINE W REFLEX MICROSCOPIC
Bilirubin Urine: NEGATIVE
Hgb urine dipstick: NEGATIVE
Ketones, ur: NEGATIVE mg/dL
Protein, ur: 30 mg/dL — AB
Urobilinogen, UA: 1 mg/dL (ref 0.0–1.0)

## 2011-03-02 LAB — URINE CULTURE
Colony Count: >=100000
Culture  Setup Time: 201109050856

## 2011-03-02 LAB — URINE MICROSCOPIC-ADD ON

## 2011-03-02 LAB — CBC
HCT: 33.1 % — ABNORMAL LOW (ref 36.0–46.0)
HCT: 31 % — ABNORMAL LOW (ref 36.0–46.0)
HCT: 31.7 % — ABNORMAL LOW (ref 36.0–46.0)
HCT: 32.8 % — ABNORMAL LOW (ref 36.0–46.0)
HCT: 32.9 % — ABNORMAL LOW (ref 36.0–46.0)
Hemoglobin: 10.2 g/dL — ABNORMAL LOW (ref 12.0–15.0)
Hemoglobin: 9.9 g/dL — ABNORMAL LOW (ref 12.0–15.0)
Hemoglobin: 10 g/dL — ABNORMAL LOW (ref 12.0–15.0)
Hemoglobin: 10.2 g/dL — ABNORMAL LOW (ref 12.0–15.0)
Hemoglobin: 9.8 g/dL — ABNORMAL LOW (ref 12.0–15.0)
MCH: 27.6 pg (ref 26.0–34.0)
MCH: 28.1 pg (ref 26.0–34.0)
MCH: 27.7 pg (ref 26.0–34.0)
MCH: 27.9 pg (ref 26.0–34.0)
MCH: 28.6 pg (ref 26.0–34.0)
MCHC: 30.4 g/dL (ref 30.0–36.0)
MCHC: 31.1 g/dL (ref 30.0–36.0)
MCHC: 31.5 g/dL (ref 30.0–36.0)
MCHC: 31.1 g/dL (ref 30.0–36.0)
MCHC: 31.5 g/dL (ref 30.0–36.0)
MCHC: 31.6 g/dL (ref 30.0–36.0)
MCV: 90.4 fL (ref 78.0–100.0)
MCV: 90.7 fL (ref 78.0–100.0)
MCV: 87.6 fL (ref 78.0–100.0)
MCV: 89.1 fL (ref 78.0–100.0)
MCV: 90.4 fL (ref 78.0–100.0)
Platelets: 339 10*3/uL (ref 150–400)
Platelets: 370 10*3/uL (ref 150–400)
RBC: 3.63 MIL/uL — ABNORMAL LOW (ref 3.87–5.11)
RBC: 3.65 MIL/uL — ABNORMAL LOW (ref 3.87–5.11)
RBC: 3.58 MIL/uL — ABNORMAL LOW (ref 3.87–5.11)
RDW: 15.3 % (ref 11.5–15.5)
RDW: 15.8 % — ABNORMAL HIGH (ref 11.5–15.5)
RDW: 16.2 % — ABNORMAL HIGH (ref 11.5–15.5)
RDW: 14.9 % (ref 11.5–15.5)
RDW: 15.2 % (ref 11.5–15.5)
RDW: 15.3 % (ref 11.5–15.5)
WBC: 8.8 10*3/uL (ref 4.0–10.5)
WBC: 9.1 10*3/uL (ref 4.0–10.5)
WBC: 13.4 10*3/uL — ABNORMAL HIGH (ref 4.0–10.5)
WBC: 13.9 10*3/uL — ABNORMAL HIGH (ref 4.0–10.5)

## 2011-03-02 LAB — TSH: TSH: 1.503 u[IU]/mL (ref 0.350–4.500)

## 2011-03-02 LAB — HEPARIN ANTI-XA
Heparin LMW: 1.11 IU/mL
Heparin LMW: 1.58 IU/mL

## 2011-03-02 LAB — MAGNESIUM: Magnesium: 2.3 mg/dL (ref 1.5–2.5)

## 2011-03-02 LAB — LIPID PANEL
Triglycerides: 100 mg/dL (ref <150)
VLDL: 20 mg/dL (ref 0–40)

## 2011-03-02 LAB — HEMOGLOBIN AND HEMATOCRIT, BLOOD
HCT: 31 % — ABNORMAL LOW (ref 36.0–46.0)
HCT: 31.8 % — ABNORMAL LOW (ref 36.0–46.0)
HCT: 32.1 % — ABNORMAL LOW (ref 36.0–46.0)
Hemoglobin: 10.1 g/dL — ABNORMAL LOW (ref 12.0–15.0)
Hemoglobin: 10.1 g/dL — ABNORMAL LOW (ref 12.0–15.0)
Hemoglobin: 9.9 g/dL — ABNORMAL LOW (ref 12.0–15.0)
Hemoglobin: 9.9 g/dL — ABNORMAL LOW (ref 12.0–15.0)
Hemoglobin: 9.9 g/dL — ABNORMAL LOW (ref 12.0–15.0)

## 2011-03-02 LAB — CARDIAC PANEL(CRET KIN+CKTOT+MB+TROPI)
CK, MB: 0.4 ng/mL (ref 0.3–4.0)
CK, MB: 0.6 ng/mL (ref 0.3–4.0)
Troponin I: 0.02 ng/mL (ref 0.00–0.06)
Troponin I: 0.02 ng/mL (ref 0.00–0.06)

## 2011-03-02 LAB — HEPATIC FUNCTION PANEL
ALT: 24 U/L (ref 0–35)
AST: 33 U/L (ref 0–37)
Alkaline Phosphatase: 61 U/L (ref 39–117)
Bilirubin, Direct: 0.2 mg/dL (ref 0.0–0.3)
Indirect Bilirubin: 0.4 mg/dL (ref 0.3–0.9)
Total Bilirubin: 0.6 mg/dL (ref 0.3–1.2)

## 2011-03-02 LAB — BRAIN NATRIURETIC PEPTIDE: Pro B Natriuretic peptide (BNP): 48 pg/mL (ref 0.0–100.0)

## 2011-03-02 LAB — HEPARIN LEVEL (UNFRACTIONATED): Heparin Unfractionated: 0.53 IU/mL (ref 0.30–0.70)

## 2011-03-02 LAB — PROTIME-INR
INR: 1.17 (ref 0.00–1.49)
Prothrombin Time: 14.5 seconds (ref 11.6–15.2)

## 2011-03-02 LAB — PHOSPHORUS: Phosphorus: 3.4 mg/dL (ref 2.3–4.6)

## 2011-03-02 LAB — FACTOR 10 ASSAY: Factor X Activity: 100 % (ref 72–134)

## 2011-03-02 LAB — CK TOTAL AND CKMB (NOT AT ARMC): Relative Index: INVALID (ref 0.0–2.5)

## 2011-03-03 LAB — CBC
HCT: 31.4 % — ABNORMAL LOW (ref 36.0–46.0)
HCT: 31.6 % — ABNORMAL LOW (ref 36.0–46.0)
HCT: 31.7 % — ABNORMAL LOW (ref 36.0–46.0)
HCT: 31.8 % — ABNORMAL LOW (ref 36.0–46.0)
HCT: 32.8 % — ABNORMAL LOW (ref 36.0–46.0)
HCT: 38.9 % (ref 36.0–46.0)
Hemoglobin: 10.5 g/dL — ABNORMAL LOW (ref 12.0–15.0)
Hemoglobin: 10.6 g/dL — ABNORMAL LOW (ref 12.0–15.0)
Hemoglobin: 10.6 g/dL — ABNORMAL LOW (ref 12.0–15.0)
Hemoglobin: 10.8 g/dL — ABNORMAL LOW (ref 12.0–15.0)
Hemoglobin: 13 g/dL (ref 12.0–15.0)
MCH: 28.8 pg (ref 26.0–34.0)
MCH: 29.8 pg (ref 26.0–34.0)
MCH: 30 pg (ref 26.0–34.0)
MCH: 30.1 pg (ref 26.0–34.0)
MCH: 30.1 pg (ref 26.0–34.0)
MCH: 30.2 pg (ref 26.0–34.0)
MCHC: 31.5 g/dL (ref 30.0–36.0)
MCHC: 32.9 g/dL (ref 30.0–36.0)
MCHC: 33.5 g/dL (ref 30.0–36.0)
MCHC: 33.6 g/dL (ref 30.0–36.0)
MCHC: 33.7 g/dL (ref 30.0–36.0)
MCHC: 33.9 g/dL (ref 30.0–36.0)
MCV: 88.7 fL (ref 78.0–100.0)
MCV: 89.4 fL (ref 78.0–100.0)
MCV: 90.1 fL (ref 78.0–100.0)
MCV: 90.3 fL (ref 78.0–100.0)
MCV: 90.3 fL (ref 78.0–100.0)
MCV: 90.4 fL (ref 78.0–100.0)
Platelets: 137 10*3/uL — ABNORMAL LOW (ref 150–400)
Platelets: 148 10*3/uL — ABNORMAL LOW (ref 150–400)
Platelets: 149 10*3/uL — ABNORMAL LOW (ref 150–400)
RBC: 3.61 MIL/uL — ABNORMAL LOW (ref 3.87–5.11)
RBC: 3.48 MIL/uL — ABNORMAL LOW (ref 3.87–5.11)
RBC: 3.5 MIL/uL — ABNORMAL LOW (ref 3.87–5.11)
RBC: 3.51 MIL/uL — ABNORMAL LOW (ref 3.87–5.11)
RBC: 3.59 MIL/uL — ABNORMAL LOW (ref 3.87–5.11)
RBC: 3.76 MIL/uL — ABNORMAL LOW (ref 3.87–5.11)
RBC: 4.33 MIL/uL (ref 3.87–5.11)
RDW: 14.7 % (ref 11.5–15.5)
RDW: 14.8 % (ref 11.5–15.5)
WBC: 11.6 10*3/uL — ABNORMAL HIGH (ref 4.0–10.5)
WBC: 14.7 10*3/uL — ABNORMAL HIGH (ref 4.0–10.5)
WBC: 14.9 10*3/uL — ABNORMAL HIGH (ref 4.0–10.5)
WBC: 17 10*3/uL — ABNORMAL HIGH (ref 4.0–10.5)

## 2011-03-03 LAB — POCT CARDIAC MARKERS
CKMB, poc: 1.1 ng/mL (ref 1.0–8.0)
Myoglobin, poc: 200 ng/mL (ref 12–200)
Troponin i, poc: 0.1 ng/mL — ABNORMAL HIGH (ref 0.00–0.09)
Troponin i, poc: 0.18 ng/mL — ABNORMAL HIGH (ref 0.00–0.09)

## 2011-03-03 LAB — HEPARIN LEVEL (UNFRACTIONATED)
Heparin Unfractionated: 0.51 IU/mL (ref 0.30–0.70)
Heparin Unfractionated: <0.1 IU/mL — ABNORMAL LOW (ref 0.30–0.70)
Heparin Unfractionated: <0.1 IU/mL — ABNORMAL LOW (ref 0.30–0.70)

## 2011-03-03 LAB — DIFFERENTIAL
Eosinophils Absolute: 0.1 10*3/uL (ref 0.0–0.7)
Eosinophils Relative: 0 % (ref 0–5)
Eosinophils Relative: 1 % (ref 0–5)
Lymphocytes Relative: 6 % — ABNORMAL LOW (ref 12–46)
Lymphs Abs: 0.9 10*3/uL (ref 0.7–4.0)
Lymphs Abs: 1 10*3/uL (ref 0.7–4.0)
Monocytes Absolute: 1.5 10*3/uL — ABNORMAL HIGH (ref 0.1–1.0)
Monocytes Absolute: 1.3 10*3/uL — ABNORMAL HIGH (ref 0.1–1.0)
Monocytes Relative: 10 % (ref 3–12)
Monocytes Relative: 12 % (ref 3–12)

## 2011-03-03 LAB — COMPREHENSIVE METABOLIC PANEL
ALT: 37 U/L — ABNORMAL HIGH (ref 0–35)
AST: 38 U/L — ABNORMAL HIGH (ref 0–37)
Albumin: 2.6 g/dL — ABNORMAL LOW (ref 3.5–5.2)
Calcium: 9.4 mg/dL (ref 8.4–10.5)
Chloride: 102 mEq/L (ref 96–112)
Creatinine, Ser: 1.42 mg/dL — ABNORMAL HIGH (ref 0.4–1.2)
GFR calc Af Amer: 44 mL/min — ABNORMAL LOW (ref >60)
Sodium: 139 mEq/L (ref 135–145)

## 2011-03-03 LAB — BASIC METABOLIC PANEL
BUN: 25 mg/dL — ABNORMAL HIGH (ref 6–23)
BUN: 25 mg/dL — ABNORMAL HIGH (ref 6–23)
BUN: 53 mg/dL — ABNORMAL HIGH (ref 6–23)
CO2: 22 mEq/L (ref 19–32)
CO2: 22 mEq/L (ref 19–32)
CO2: 27 mEq/L (ref 19–32)
CO2: 28 mEq/L (ref 19–32)
Chloride: 107 mEq/L (ref 96–112)
Chloride: 108 mEq/L (ref 96–112)
Chloride: 113 mEq/L — ABNORMAL HIGH (ref 96–112)
Chloride: 113 mEq/L — ABNORMAL HIGH (ref 96–112)
Chloride: 115 mEq/L — ABNORMAL HIGH (ref 96–112)
Creatinine, Ser: 1.62 mg/dL — ABNORMAL HIGH (ref 0.4–1.2)
Creatinine, Ser: 3.14 mg/dL — ABNORMAL HIGH (ref 0.4–1.2)
Creatinine, Ser: 3.64 mg/dL — ABNORMAL HIGH (ref 0.4–1.2)
GFR calc Af Amer: 13 mL/min — ABNORMAL LOW (ref >60)
GFR calc Af Amer: 18 mL/min — ABNORMAL LOW (ref >60)
GFR calc Af Amer: 27 mL/min — ABNORMAL LOW (ref >60)
GFR calc Af Amer: 30 mL/min — ABNORMAL LOW (ref >60)
GFR calc Af Amer: 34 mL/min — ABNORMAL LOW (ref >60)
GFR calc Af Amer: 38 mL/min — ABNORMAL LOW (ref >60)
GFR calc non Af Amer: 12 mL/min — ABNORMAL LOW (ref >60)
GFR calc non Af Amer: 15 mL/min — ABNORMAL LOW (ref >60)
GFR calc non Af Amer: 23 mL/min — ABNORMAL LOW (ref >60)
GFR calc non Af Amer: 25 mL/min — ABNORMAL LOW (ref >60)
Glucose, Bld: 126 mg/dL — ABNORMAL HIGH (ref 70–99)
Glucose, Bld: 151 mg/dL — ABNORMAL HIGH (ref 70–99)
Glucose, Bld: 153 mg/dL — ABNORMAL HIGH (ref 70–99)
Glucose, Bld: 86 mg/dL (ref 70–99)
Potassium: 3.4 mEq/L — ABNORMAL LOW (ref 3.5–5.1)
Potassium: 3.7 mEq/L (ref 3.5–5.1)
Potassium: 3.8 mEq/L (ref 3.5–5.1)
Potassium: 3.9 mEq/L (ref 3.5–5.1)
Potassium: 3.9 mEq/L (ref 3.5–5.1)
Potassium: 4.2 mEq/L (ref 3.5–5.1)
Potassium: 4.6 mEq/L (ref 3.5–5.1)
Sodium: 140 mEq/L (ref 135–145)
Sodium: 140 mEq/L (ref 135–145)
Sodium: 141 mEq/L (ref 135–145)
Sodium: 142 mEq/L (ref 135–145)
Sodium: 145 mEq/L (ref 135–145)

## 2011-03-03 LAB — PROTEIN C ACTIVITY: Protein C Activity: 122 % (ref 75–133)

## 2011-03-03 LAB — LUPUS ANTICOAGULANT PANEL
DRVVT: 57.1 secs — ABNORMAL HIGH (ref 36.2–44.3)
PTT Lupus Anticoagulant: 51.9 secs — ABNORMAL HIGH (ref 30.0–45.6)

## 2011-03-03 LAB — LIPID PANEL
Cholesterol: 137 mg/dL (ref 0–200)
HDL: 29 mg/dL — ABNORMAL LOW (ref >39)
Triglycerides: 152 mg/dL — ABNORMAL HIGH (ref <150)

## 2011-03-03 LAB — PROTEIN C, TOTAL: Protein C, Total: 98 % (ref 70–140)

## 2011-03-03 LAB — PROTHROMBIN GENE MUTATION

## 2011-03-03 LAB — ANTITHROMBIN III: AntiThromb III Func: 87 % (ref 76–126)

## 2011-03-03 LAB — CARDIOLIPIN ANTIBODIES, IGG, IGM, IGA
Anticardiolipin IgG: 5 GPL U/mL — ABNORMAL LOW (ref <23)
Anticardiolipin IgM: 6 MPL U/mL — ABNORMAL LOW (ref <11)

## 2011-03-03 LAB — CARDIAC PANEL(CRET KIN+CKTOT+MB+TROPI)
Total CK: 77 U/L (ref 7–177)
Troponin I: 0.23 ng/mL — ABNORMAL HIGH (ref 0.00–0.06)

## 2011-03-03 LAB — BETA-2-GLYCOPROTEIN I ABS, IGG/M/A: Beta-2-Glycoprotein I IgM: 31 M Units — ABNORMAL HIGH (ref <20)

## 2011-03-03 LAB — TROPONIN I: Troponin I: 0.35 ng/mL — ABNORMAL HIGH (ref 0.00–0.06)

## 2011-03-03 LAB — HOMOCYSTEINE: Homocysteine: 16.8 umol/L — ABNORMAL HIGH (ref 4.0–15.4)

## 2011-03-03 LAB — BRAIN NATRIURETIC PEPTIDE
Pro B Natriuretic peptide (BNP): 432 pg/mL — ABNORMAL HIGH (ref 0.0–100.0)
Pro B Natriuretic peptide (BNP): 808 pg/mL — ABNORMAL HIGH (ref 0.0–100.0)

## 2011-03-03 LAB — PROTEIN S, TOTAL: Protein S Ag, Total: 155 % — ABNORMAL HIGH (ref 70–140)

## 2011-03-07 NOTE — Letter (Signed)
Summary: [facility]  [facility]   Imported By: Sherian Rein 03/02/2011 09:34:32  _____________________________________________________________________  External Attachment:    Type:   Image     Comment:   External Document

## 2011-03-13 ENCOUNTER — Other Ambulatory Visit: Payer: Self-pay | Admitting: Hematology & Oncology

## 2011-03-13 ENCOUNTER — Encounter (HOSPITAL_BASED_OUTPATIENT_CLINIC_OR_DEPARTMENT_OTHER): Payer: Medicare Other | Admitting: Hematology & Oncology

## 2011-03-13 DIAGNOSIS — I82409 Acute embolism and thrombosis of unspecified deep veins of unspecified lower extremity: Secondary | ICD-10-CM

## 2011-03-13 DIAGNOSIS — Z86718 Personal history of other venous thrombosis and embolism: Secondary | ICD-10-CM

## 2011-03-13 DIAGNOSIS — D649 Anemia, unspecified: Secondary | ICD-10-CM

## 2011-03-13 LAB — CBC WITH DIFFERENTIAL (CANCER CENTER ONLY)
BASO#: 0 10*3/uL (ref 0.0–0.2)
EOS%: 1.7 % (ref 0.0–7.0)
Eosinophils Absolute: 0.1 10*3/uL (ref 0.0–0.5)
HGB: 12.6 g/dL (ref 11.6–15.9)
LYMPH#: 0.8 10*3/uL — ABNORMAL LOW (ref 0.9–3.3)
MONO#: 0.7 10*3/uL (ref 0.1–0.9)
NEUT#: 4.2 10*3/uL (ref 1.5–6.5)
RBC: 4.4 10*6/uL (ref 3.70–5.32)
WBC: 5.9 10*3/uL (ref 3.9–10.0)

## 2011-03-14 LAB — D-DIMER, QUANTITATIVE: D-Dimer, Quant: 1.17 ug/mL-FEU — ABNORMAL HIGH (ref 0.00–0.48)

## 2011-03-14 LAB — FERRITIN: Ferritin: 37 ng/mL (ref 10–291)

## 2011-03-22 ENCOUNTER — Other Ambulatory Visit: Payer: Self-pay | Admitting: Pulmonary Disease

## 2011-04-04 LAB — DIFFERENTIAL
Basophils Absolute: 0 10*3/uL (ref 0.0–0.1)
Eosinophils Relative: 1 % (ref 0–5)
Lymphocytes Relative: 14 % (ref 12–46)
Monocytes Absolute: 0.8 10*3/uL (ref 0.1–1.0)

## 2011-04-04 LAB — CBC
HCT: 40.6 % (ref 36.0–46.0)
Hemoglobin: 13.8 g/dL (ref 12.0–15.0)
RDW: 14.3 % (ref 11.5–15.5)

## 2011-04-04 LAB — BASIC METABOLIC PANEL
CO2: 27 mEq/L (ref 19–32)
Chloride: 108 mEq/L (ref 96–112)
GFR calc non Af Amer: 56 mL/min — ABNORMAL LOW (ref >60)
Glucose, Bld: 98 mg/dL (ref 70–99)
Potassium: 4.6 mEq/L (ref 3.5–5.1)
Sodium: 143 mEq/L (ref 135–145)

## 2011-04-04 LAB — APTT: aPTT: 30 seconds (ref 24–37)

## 2011-04-13 ENCOUNTER — Encounter (HOSPITAL_BASED_OUTPATIENT_CLINIC_OR_DEPARTMENT_OTHER): Payer: Medicare Other | Admitting: Hematology & Oncology

## 2011-04-13 ENCOUNTER — Other Ambulatory Visit: Payer: Self-pay | Admitting: Hematology & Oncology

## 2011-04-13 DIAGNOSIS — D649 Anemia, unspecified: Secondary | ICD-10-CM

## 2011-04-13 DIAGNOSIS — I82409 Acute embolism and thrombosis of unspecified deep veins of unspecified lower extremity: Secondary | ICD-10-CM

## 2011-04-13 DIAGNOSIS — Z86718 Personal history of other venous thrombosis and embolism: Secondary | ICD-10-CM

## 2011-04-13 DIAGNOSIS — Z7901 Long term (current) use of anticoagulants: Secondary | ICD-10-CM

## 2011-04-13 LAB — CBC WITH DIFFERENTIAL (CANCER CENTER ONLY)
BASO#: 0 10*3/uL (ref 0.0–0.2)
Eosinophils Absolute: 0.2 10*3/uL (ref 0.0–0.5)
HGB: 12.2 g/dL (ref 11.6–15.9)
MCH: 28.8 pg (ref 26.0–34.0)
MCV: 89 fL (ref 81–101)
MONO#: 0.9 10*3/uL (ref 0.1–0.9)
MONO%: 12.3 % (ref 0.0–13.0)
NEUT#: 4.5 10*3/uL (ref 1.5–6.5)
RBC: 4.24 10*6/uL (ref 3.70–5.32)
WBC: 6.9 10*3/uL (ref 3.9–10.0)

## 2011-04-13 LAB — COMPREHENSIVE METABOLIC PANEL
AST: 22 U/L (ref 0–37)
Albumin: 4.2 g/dL (ref 3.5–5.2)
Alkaline Phosphatase: 60 U/L (ref 39–117)
Potassium: 4 mEq/L (ref 3.5–5.3)
Sodium: 142 mEq/L (ref 135–145)
Total Protein: 7.9 g/dL (ref 6.0–8.3)

## 2011-04-13 LAB — CHCC SATELLITE - SMEAR

## 2011-04-13 LAB — RETICULOCYTES (CHCC): RBC.: 4.26 MIL/uL (ref 3.87–5.11)

## 2011-04-21 ENCOUNTER — Other Ambulatory Visit: Payer: Self-pay | Admitting: Pulmonary Disease

## 2011-04-21 ENCOUNTER — Other Ambulatory Visit: Payer: Self-pay | Admitting: *Deleted

## 2011-04-21 MED ORDER — CLORAZEPATE DIPOTASSIUM 7.5 MG PO TABS: 7.5000 mg | ORAL_TABLET | Freq: Three times a day (TID) | ORAL | Status: AC

## 2011-05-02 NOTE — Discharge Summary (Signed)
[name] [name], [name] [name]              ACCOUNT NO.:  [id number]   MEDICAL RECORD NO.:  [id number]          PATIENT TYPE:  INP   LOCATION:  4740                         FACILITY:  [facility]   PHYSICIAN:  Doylene Canning. Ladona Ridgel, MD    DATE OF BIRTH:  10-17-1936   DATE OF ADMISSION:  07/31/2008  DATE OF DISCHARGE:  08/05/2008                               DISCHARGE SUMMARY   ALLERGIES:  The patient has allergies to IMITREX and SULFA.   DICTATION AND EXAMINATION TIME:  Greater than 40 minutes.   FINAL DIAGNOSES:  1. Admitted with fatigue and shortness of breath.  2. Finding of complete heart block/high-grade atrioventricular block.  3. Substernal chest pain for the last 2 weeks:  All troponin I studies      x4 are negative.  4. Left heart catheterization, August 03, 2008:  Ejection fraction      40%, nonobstructive coronary artery disease.  5. Echocardiogram, August 02, 2008:  Ejection fraction 50%, no left      ventricular wall motion abnormalities.  There is dyssynergic motion      of the interventricular septum.  6. Left bundle branch block with a QRS of 140-150 milliseconds.  7. Pacemaker implanted, August 04, 2008, a Medtronic [place] dual-      chamber pacemaker.   SECONDARY DIAGNOSES:  1. History of deep venous thrombosis.  2. Left cerebral hemorrhage, on Coumadin therapy.  3. Hypertension.  4. Obesity.  5. Obstructive sleep apnea.  6. Diastolic congestive heart failure.  7. Pulmonary hypertension, question of sarcoidosis.  8. History of cerebrovascular accident, September 2006.  9. History of vertigo.  10.History of asthmatic bronchitis.  11.Anxiety.   PROCEDURES:  1. August 02, 2008, echocardiogram:  Ejection fraction of 50%, no left      ventricular wall motion abnormalities, and dyssynergic motion of IV      septum.  2. Left heart catheterization, August 03, 2008:  Ejection fraction of      40%.  The LAD had 30% proximal stenosis.  The diagonals are all      free of disease.   The left circumflex had a 30% proximal stenosis      near the ostium.  The obtuse marginal was free of disease and the      right coronary artery and posterolateral branch contained no      coronary artery disease.  Ejection fraction of 40%.  3. August 04, 2008, implantation of the Medtronic [place] dual-chamber      pacemaker for complete heart block/high-grade heart block.   BRIEF HISTORY:  [name] [name] is a 75 year old female.  She presents with  shortness of breath.  She has had a bradycardia as well.  She has had 2  weeks of fatigue and shortness of breath.  The patient was seen in the  office of her primary caregiver for the above symptoms.  Telemetry  showed a heart rate of 30 with complete heart block.  Electrocardiogram  at [facility] emergency room showed second-degree heart block,  Mobitz type II.  The patient also has substernal chest pain with  exertion  over the past 2 weeks which is relieved with rest.  The patient  says she has been very weak lately and sluggish.  The patient is  normally very active and does all of her work at home.  She had a stress  test in August 2004, which showed anterior and apical infarction with  mild ischemia.  Ejection fraction of 35%.   HOSPITAL COURSE:  The patient was admitted with fatigue, shortness of  breath, and substernal chest pain.  Her troponin I studies were cycled  and they were all negative x4.  She was set up for left heart  catheterization which was done on August 03, 2008 with ejection fraction  of 40% and nonobstructive coronary artery disease.  She also had an  echocardiogram this admission that showed ejection fraction of 50%.  She  was seen in consultation by electrophysiologist, Dr. Lewayne Bunting.  He  recommended implantation of a pacemaker for complete heart block and  high-grade heart block.  However, the patient does not have high-grade  congestive heart failure symptoms.  So despite the fact that she has  left  bundle branch block with wide QRS, a BiV pacer was not implanted.  She received a Medtronic dual-chamber [place] pacemaker on August 04, 2008 and is discharging post pacemaker implant day #1.  The device has  been interrogated on day #1.  All values within normal limits.  The  chest x-ray was examined.  It shows no pneumothorax and the leads are in  appropriate position.  She was seen by Dr. Ladona Ridgel prior to discharge who  added Toprol-XL 25 mg daily.  Because of heart block, the patient's  atenolol which she had been taking at home had been placed on hold.   The patient discharges with the following medications.  1. Diovan 160 mg daily.  2. Furosemide 20 mg 1-2 tablets daily.  3. Potassium chloride 20 mEq daily.  4. Enteric-coated aspirin 81 mg daily.  5. Zyrtec 10 mg daily.  6. Calcium with vitamin D 500 mg 3 times daily.  7. Clorazepate 7.5 mg 3 times daily as needed.  8. Prednisone 5 mg every other day.  9. Multivitamin daily.  10.Protegra vitamin daily.  11.Allergy shot weekly.  12.Metoprolol succinate 25 mg daily, the new medication this      admission.  She was given a prescription of 13 and is to stop      atenolol.   FOLLOWUP:  She has followup at the [facility] [address]  1. Pacer Clinic on August 17, 2008 at 2:30.  2. She will see Dr. Clifton James on August 25, 2008 at 10:30.  3. She follows up with Dr. Ladona Ridgel on Tuesday, November 17, 2008 at      9:30.   The patient has been given instructions as to how to take care of her  incision and mobility of the left arm.   LABORATORY STUDIES:  Troponin I studies 0.03, 0.01, 0.02, and 0.02.  Complete blood count on August 04, 2008:  Hemoglobin 13.4, hematocrit  40.1, white cells 7.3, and platelets of 175.  Serum electrolytes:  Sodium 141, potassium 3.9, chloride 106, carbonate 28, BUN is 16,  creatinine 0.9,  and glucose 95.  Protime this admission 12.9, and INR  1.0.  Alkaline phosphatase this  admission 53, SGOT 22, and SGPT 15.  TSH  this admission was 1.279.      Maple Mirza, PA  Doylene Canning. Ladona Ridgel, MD  Electronically Signed    GM/MEDQ  D:  08/05/2008  T:  08/05/2008  Job:  [id number]   cc:   Lonzo Cloud. Kriste Basque, MD  Doylene Canning. Ladona Ridgel, MD  Gustavus Messing Orlin Hilding, M.D.

## 2011-05-02 NOTE — Op Note (Signed)
NAMESHADANA, PRY              ACCOUNT NO.:  [id number]   MEDICAL RECORD NO.:  [id number]          PATIENT TYPE:  INP   LOCATION:  4740                         FACILITY:  [facility]   PHYSICIAN:  Doylene Canning. Ladona Ridgel, MD    DATE OF BIRTH:  Aug 03, 1936   DATE OF PROCEDURE:  08/04/2008  DATE OF DISCHARGE:                               OPERATIVE REPORT   PROCEDURE PERFORMED:  Insertion of a dual-chamber pacemaker.   INDICATIONS:  Symptomatic complete heart block.   INTRODUCTION:  The patient is a very pleasant 75 year old woman with a  long history of bradycardia and left bundle branch block who  subsequently developed a complete heart block.  This is with a right  bundle branch block escape.  She is now referred for dual-chamber  pacemaker insertion.   PROCEDURE:  After informed consent was obtained, the patient was taken  to the Diagnostic EP Lab in a fasting state.  After usual preparation  and draping, intravenous fentanyl and midazolam was given for sedation.  Lidocaine 30 mL was infiltrated into the left infraclavicular region.  A  5 cm incision was carried out over this region and electrocautery was  utilized to dissect down to the fascial plane.  The left subclavian vein  was punctured and the Medtronic model 5076 52 cm active fixation pacing  lead, serial number [id number] was advanced into the right ventricle and  the Medtronic model 5076 45 cm active fixation pacing lead, serial  number [id number] was advanced into the right atrium.  Mapping was  carried out in the right ventricle at the final site on the RV septum.  The R-waves measured 15 mV, and the pacing impedance was 822 ohms,  threshold was 0.6 volts at 0.5 milliseconds, 10 volt pacing did not  stimulate the diaphragm.  With the right ventricular lead in  satisfactory position, attention was then turned to placement of the  atrial lead, which was placed in the anterolateral portion of the right  atrium where P-waves  measured 4 mV and impedance was 565 ohms.  The  threshold here was 0.7 volts at 0.5 milliseconds.  Again, 10 volt pacing  did not stimulate the diaphragm.  With both the atrial and ventricular  leads in satisfactory position, they were secured to the subpectoralis  fascia with a figure of eight silk suture.  Sewing sleeve was also  secured with silk suture.  Electrocautery was utilized to make a  subcutaneous pocket.  Kanamycin irrigation was utilized to irrigate the  pocket and electrocautery was utilized to assure hemostasis.  The  Medtronic [place] dual chamber pacemaker, serial number [id number] was  connected to the atrial and ventricular leads and placed back in  subcutaneous pocket.  Generator secured with silk suture.  Additional  kanamycin was then utilized to irrigate the pocket.  The incision was  closed with 2-0 Vicryl followed by layer of 3-0 Vicryl.  Benzoin was  painted on the skin, Steri-Strips were applied, and a pressure dressing  was placed, and the patient was returned to her room in satisfactory  condition.   COMPLICATIONS:  There were  no immediate procedure complications.   RESULTS:  Demonstrate successful implantation of a Medtronic dual-  chamber pacemaker in a patient with complete heart block.      Doylene Canning. Ladona Ridgel, MD  Electronically Signed     GWT/MEDQ  D:  08/04/2008  T:  08/05/2008  Job:  [id number]

## 2011-05-02 NOTE — Assessment & Plan Note (Signed)
[facility]                             PULMONARY OFFICE NOTE   NAME:[name], [name] WINQUIST                     MRN:          [id number]  DATE:06/11/2007                            DOB:          [birth year]    HISTORY OF PRESENT ILLNESS:  Patient is a 75 year old white female  patient of Dr. Kriste Basque, who presents today for a one-month followup.  Patient had recently had some enlarged lymph nodes and a CT chest on  April 3 showed enlarged supraclavicular mediastinal bilateral hilar and  upper abdominal lymph nodes.  Patient underwent an excisional biopsy on  the left supraclavicular, which showed extensive replacement by  granulomatous inflammation.  Lab work revealed a negative ANA ACE level  at 65, which is the upper limits of normal and a slightly elevated sed  rate at 35.  Patient was started on prednisone at 20 mg and returns  today for followup.  Patient does complain that she feels that  prednisone is causing her to be somewhat anxious, difficulty sleeping.  Patient denies any chest pain, shortness of breath, abdominal pain,  nausea or vomiting, or leg swelling.   PAST MEDICAL HISTORY:  Reviewed.   CURRENT MEDICATIONS:  Reviewed.   PHYSICAL EXAM:  Patient is a pleasant female, in no acute distress.  She  is afebrile with stable vital signs.  Her O2 saturation is 95% on room  air.  HEENT:  Unremarkable.  NECK:  Supple without cervical adenopathy.  No JVD.  LUNG SOUNDS:  Clear.  CARDIAC:  Regular rate and rhythm.  ABDOMEN:  Soft and nontender.  EXTREMITIES:  Warm without any calf cyanosis, clubbing or edema.  Along  the left supraclavicular area with a well-healed surgical scar and a  small palpable node.   DATA:  Apr 22, 2007, left supraclavicular lymph node excisional biopsy  showed extensive replacement by granulomatous inflammation, which was  suggestive of sarcoid.   IMPRESSION AND PLAN:  Diffuse lymphadenopathy, suspicious for sarcoid.  Patient will begin a slow prednisone taper.  She will begin at 15 mg  times one week and then hold at 10 mg until she is seen back in the  office here in three to four weeks with Dr. Kriste Basque or sooner, if needed.      Rubye Oaks, NP  Electronically Signed      Lonzo Cloud. Kriste Basque, MD  Electronically Signed   TP/MedQ  DD: 06/12/2007  DT: 06/12/2007  Job #: [id number]

## 2011-05-02 NOTE — Assessment & Plan Note (Signed)
[facility]                         ELECTROPHYSIOLOGY OFFICE NOTE   NAME:[name] [name], [name] [name]                     MRN:          [id number]  DATE:11/21/2007                            DOB:          1936/08/14    [name] returns today for follow-up.  She is a very pleasant 75-year-  old woman with a history of chronic left bundle branch block, chronic  dyspnea, history of sarcoidosis diagnosed by a lymph node biopsy,  presumably involving the lungs plus or minus the heart, who returns  today for follow-up.  Overall, in the interim she has been stable.  Her  dyspnea symptoms have not worsened.  She has class II diastolic heart  failure symptoms.  She has rare palpitations, no sustained heart racing,  and has had no syncope.  She notes that she is being referred to [facility] for additional neurologic evaluation by Dr. Orlin Hilding because of  spells which are presently undiagnosed.   NECK:  No jugular venous distention.  LUNGS:  Clear bilaterally to auscultation.  No wheezes, rales or rhonchi  were present.  CARDIOVASCULAR:  A regular rate and rhythm with a normal S1 and S2.  The  S2 was split.  EXTREMITIES:  No edema.   EKG demonstrates sinus rhythm with a left axis and left bundle branch  block.   IMPRESSION:  1. 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  2. Left bundle branch block.  3. Chronic dyspnea thought secondary to sarcoidosis.  4. Mild to moderate obesity.   DISCUSSION:  [name] symptoms are otherwise stable.  I have asked  that she stay as active as she can and to not do any additional  strenuous activity.  I will plan to see the patient back in the office  in one year or sooner should she have worsening symptoms of heart  failure.     Doylene Canning. Ladona Ridgel, MD  Electronically Signed    GWT/MedQ  DD: 11/21/2007  DT: 11/21/2007  Job #: [id number]

## 2011-05-02 NOTE — Op Note (Signed)
NAMEANDERA, CRANMER              ACCOUNT NO.:  [id number]   MEDICAL RECORD NO.:  [id number]          PATIENT TYPE:  OIB   LOCATION:  5002                         FACILITY:  [facility]   PHYSICIAN:  Feliberto Gottron. Turner Daniels, M.D.   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   POSTOPERATIVE DIAGNOSES:  Right shoulder impingement syndrome with a  type 3 subacromial spur, subclavicular spurring as well, and a massive  rotator cuff tear as well as some degenerative tearing of the labrum,  and chondromalacia of the glenoid.   PROCEDURE:  Right shoulder arthroscopic anterior-inferior acromioplasty,  distal clavicle coplaning, debridement of massive rotator cuff tear,  greater tuberoplasty, debridement of chondromalacia from the glenoid,  and debridement of degenerative superior labral tearing.   SURGEON:  Feliberto Gottron. Turner Daniels, MD   FIRST ASSISTANT:  Shirl Harris, PA-C   ANESTHETIC:  Right interscalene block plus general endotracheal.   ESTIMATED BLOOD LOSS:  Minimal.   FLUID REPLACEMENT:  One liter of crystalloid.   DRAINS PLACED:  None.   TOURNIQUET TIME:  None.   INDICATIONS FOR PROCEDURE:  The patient is a 75 year old woman with  fairly classic right shoulder impingement and AC joint arthritis.  She  has failed conservative treatment, anti-inflammatory medicines, and  cortisone injections with all temporarily and she desires elective  arthroscopic evaluation and treatment of her right shoulder.  Although  she does not clinically have a rotator cuff tear, she does have some  generalized weakness of the shoulder and may in fact have a cuff tear.  Plain radiographs show a type 2 to type 3 subacromial spur, AC joint  arthritis with subclavicular spurring as well, and good  maintenance of  the interval between the humeral head and the acromion.  She desires  elective arthroscopic evaluation and treatment of her shoulder.  At age  75, rotator cuff tear would normally not be repaired because of the  quality of the tissue and she understands this.  The risks and benefits  of surgery have been discussed and questions answered.   DESCRIPTION OF PROCEDURE:  The patient was identified by armband and in  the block area at [facility] where the surgery was done because  of a history of sleep apnea, she underwent right interscalene block.  She was then taken to operating room 5 where appropriate anesthetic  monitors were attached and general endotracheal anesthesia was induced  with the patient in supine position.  She was then placed in the beach  chair position and the right upper extremity was prepped and draped in  sterile fashion from wrist to hemithorax.  She received 2 grams of Ancef  preoperatively IV.  Standard time-out procedure was then performed and  we began the operation itself by making portals 1.5 cm anterior to the  [facility] joint lateral to the junction of middle and posterior thirds of  acromion and posterior to the posterolateral corner  of acromion process  using a #11 blade.  The arthroscope was placed into the lateral portal,  a 4.2 great white sucker shaver posteriorly, and a standard inflow  cannula anteriorly.  We immediately identified a large tear of the  rotator cuff supraspinatus that went all the way back to the glenoid rim  and was debrided back to stable margin, it was unrepairable.  We also  identified a type 3 subacromial spur and subclavicular spurs and using a  4.5 hooded vortex bur, we created a type 1 subacromial and subclavicular  arch and there was still some normal-appearing cartilage on the end of  the clavicle after this resection had been accomplished.  We also  performed a greater tuberoplasty to remove any sharp edges  from the  greater tuberosity.  The arthroscope was then repositioned into the  glenohumeral joint region where we identified superior degenerative  tearing of the labrum and some chondromalacia of the glenoid and this  was debrided back to a stable margin with a 4.2 great white sucker  shaver.  At this point, the shoulder was irrigated out with normal  saline solution.  The arthroscopic instruments were removed, and a  dressing of Xeroform, 4x4 dressing sponges, paper tape, and a sling  applied.  The patient was laid supine, awakened, and taken to the  recovery room without difficulty for 23-hour observation for her sleep  apnea.      Feliberto Gottron. Turner Daniels, M.D.  Electronically Signed     FJR/MEDQ  D:  02/08/2009  T:  02/09/2009  Job:  [id number]

## 2011-05-02 NOTE — Op Note (Signed)
[name] [name]              ACCOUNT NO.:  [id number]   MEDICAL RECORD NO.:  [id number]          PATIENT TYPE:  AMB   LOCATION:  DSC                          FACILITY:  [facility]   PHYSICIAN:  Katy Fitch. Sypher, M.D. 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   OPERATION:  Decompression of left ulnar nerve at left cubital tunnel  with resection of anconeus epitrochlearis muscle.   OPERATIONS:  [name] [name], M.D.   ASSISTANT:  [name] Maduro Dasnoit PA-C.   ANESTHESIA:  General by LMA.   SUPERVISING ANESTHESIOLOGIST:  [name] [name], M.D.   INDICATIONS:  [name] [name] is a 75 year old woman referred through the  courtesy of Dr. Kriste [name] for evaluation and management of left hand  numbness and weakness.   She has a complex past medical history; lumbosacral degenerative  arthritis, pulmonary hypertension, history of possible background heart  failure and deep vein thrombosis.   She was referred for evaluation and management of significant left hand  weakness.  Clinical examination suggested ulnar neuropathy with weakness  of pinch, grasp, a positive Froment sign and a positive Wartenberg sign.  Electrodiagnostic studies completed by Dr. Johna [name] confirmed severe  left ulnar neuropathy at the elbow.   Due to failure to respond to nonoperative measures, she is brought to  the operating room at this time anticipating decompression of her left  ulnar nerve.   Preoperatively she was interviewed in detail by Dr. Kipp [name],  attending anesthesiologist.  Dr. Noreene [name] review records from Dr. Jodelle [name]  practice, [facility] and the hospital regarding her past medical  history.   [name] [name] had a history of prior heart failure that has apparently  resolved.  She has had a recent echocardiogram that  revealed an  excellent ejection fraction.  She has been on chronic steroid therapy  every other day due to sarcoidosis.   Dr. Noreene [name] agreed that general anesthesia supplemented by local  anesthesia at the wound would be appropriate.  Given her history of deep  vein thrombosis, we elected to use sequential compression devices during  anesthesia to try to prevent a recurrent deep vein thrombosis.   After informed consent, she is brought to the operating room at this  time.   DESCRIPTION OF PROCEDURE:  [name] [name] is brought to the operating  room and placed in the supine position on the operating room table.   Following the induction of general anesthesia by LMA technique the left  arm was prepped with Betadine soap and solution and sterilely draped.  A  pneumatic tourniquet was applied to the proximal brachium.   Prior to the onset of anesthesia, sequential compression devices were  applied to her calves.   Following exsanguination of left arm with Esmarch bandage, the arterial  tourniquet was inflated to 250 mmHg.  The procedure commenced with 3 cm  incision posterior to the left medial epicondyle.  Subcutaneous tissue  was gently divided through abundant adipose  tissue.  The region of the  cubital tunnel was identified and a large anconeus epitrochlearis muscle  noted.  After release of the anconeus epitrochlearis muscle from the  medial epicondyle, the ulnar nerve was identified posterior to the  epicondyle.   Fibrous tissues overlying the nerve were released 6 cm above the  epicondyle and approximately 6 cm distal to the epicondyle.  There were  several thick fibrous bands deep to the anconeus epitrochlearis muscle  and significant fibrous tissue was encountered at the heads of the  flexor carpi ulnaris.  All of these bands were sequentially dissected  and released with scissors.  A [name] [name] was used to clear soft tissue  around the nerve.   The anconeus epitrochlearis  muscle was then resected with cutting  cautery.   The nerve was ranged through elbow flexion 0 to 135 degrees and noted to  be stable.  Bleeding points were electrocauterized with bipolar current  followed by infiltration of the wound margins with a 2% lidocaine for  postoperative analgesia.  The wound was then repaired with subcutaneous  suture of 4-0 Vicryl and intradermal 3-0  Prolene with Steri-Strips.   There were no apparent complications.   AFTERCARE:  [name] [name] is provided a prescription for Percocet 5 mg 1-2  tablet p.o. q.4-6 h. p.r.n. pain.  She is also advised to used  prednisone 5 mg which she has at home, 1 tablet p.o. b.i.d. for the day  following surgery, i.e. March 18, 2008.  She was provided 100 mg of Solu-  Cortef at the onset of anesthesia.   There were no apparent complications.  She will resume her usual  prednisone alternate day dosing beginning on March 20, 2008.      Katy Fitch Sypher, M.D.  Electronically Signed     RVS/MEDQ  D:  03/17/2008  T:  03/17/2008  Job:  [id number]   cc:   [name] [name]. [name] Basque, MD  Katy Fitch Sypher, M.D.

## 2011-05-02 NOTE — Cardiovascular Report (Signed)
[name] [name], [name] [name]              ACCOUNT NO.:  [id number]   MEDICAL RECORD NO.:  [id number]          PATIENT TYPE:  INP   LOCATION:  2925                         FACILITY:  [facility]   PHYSICIAN:  Verne Carrow, MDDATE OF BIRTH:  [birth year]   DATE OF PROCEDURE:  08/03/2008  DATE OF DISCHARGE:                            CARDIAC CATHETERIZATION   PROCEDURE:  1. Left heart catheterization.  2. Selective coronary angiography.  3. Left ventriculogram.  4. Nonselective injection of the abdominal aorta with renal artery      evaluation.   OPERATOR:  Verne Carrow, MD   PROCEDURE IN DETAIL:  The patient signed informed consent and was  brought to Heart Catheterization Laboratory where her right groin was  prepped and draped in a sterile fashion.  The right femoral artery was  engaged with a 6-French sheath.  The 6-French JR-4 and JL-4 catheters  were used to engage the right coronary artery and left coronary system  respectively.  A 6-French pigtail catheter was then used to cross the  aortic valve and the left ventricular angiogram was performed.  The  pigtail catheter was then pulled back to the descending aorta and an  injection slightly above the renal arteries was performed.  The patient  tolerated the procedure well.  An Angio-Seal closure device was placed  in the right femoral artery at the end of the case.   FINDINGS:  1. Left main coronary artery gives rise to circumflex and LAD and is      normal.  2. The left anterior descending artery has a 30-40% proximal stenosis.      This vessel gives rise to 2 small diagonals and a larger third      diagonal artery.  3. The circumflex coronary artery has a 30% ostial stenosis, but has      no other disease.  It gives rise to a large obtuse marginal branch.  4. The right coronary artery is a large dominant vessel and has no      disease.  5. Left ventricle: There is anterior wall hypokinesis with an ejection  fraction of 40%.  Left ventricular pressure was 205/10 with an end-      diastolic pressure of 30.  6. Right renal artery normal.  7. Left renal artery normal.   IMPRESSION:  1. Nonobstructive coronary artery disease.  2. Mildly depressed left ventricular function.  3. Symptomatic bradycardia.  4. Normal renal arteries.   RECOMMENDATIONS:  I recommend aggressive risk factor modification for  this patient's nonobstructive coronary artery disease.  Plans have been  made per the Electrophysiology team for possible pacemaker later this  week if there was no evidence of coronary disease.      Verne Carrow, MD  Electronically Signed     CM/MEDQ  D:  08/03/2008  T:  08/04/2008  Job:  [id number]   cc:   Doylene Canning. Ladona Ridgel, MD

## 2011-05-02 NOTE — Assessment & Plan Note (Signed)
[facility]                            CARDIOLOGY OFFICE NOTE   NAME:[name] [name] GATHRIGHT                     MRN:          [id number]  DATE:08/25/2008                            DOB:          November 30, 1936    HISTORY OF PRESENT ILLNESS:  [name] [name] is a pleasant 75 year old  Caucasian female with a past medical history significant for  hypertension, obstructive sleep apnea, deep venous thrombosis,  cerebrovascular accident, asthma, and obesity who was recently admitted  to the hospital with complaints of fatigue and weakness.  She was found  to have a high-grade atrioventricular block.  During her hospitalization  she was taken to the catheterization lab for left heart catheterization.  This did reveal nonobstructive coronary artery disease.  Her left  ventricular ejection fraction was noted to be around 40% during the left  heart catheterization.  Blood pressure was also significantly elevated  with left ventricular pressure of 205/10 during the procedure and an end-  diastolic pressure of 30.  The patient because of her high-grade AV  block had a pacemaker placed prior to discharge.  She comes to see me  today for followup of her left heart catheterization and management of  her coronary artery disease.  She has no complaints today and states  that she feels much better since she had her pacemaker placed.  She  denies any weakness, fatigue, chest pain, shortness of breath,  palpitations, diaphoresis, nausea, vomiting, near syncope, syncope,  orthopnea, PND, or lower extremity edema.  She is noted today to have an  elevated blood pressure and states that she never checks her blood  pressure at home.  She was in a rush to get here today and encountered  some traffic and parking problems which she says has most likely  elevated her blood pressure.   Her past medical history is significant for nonobstructive coronary  artery disease, high-grade AV block  requiring placement of a permanent  pacemaker in August 2009, hypertension, obesity, obstructive sleep  apnea, diastolic congestive heart failure, new diagnosis of systolic LV  dysfunction, pulmonary hypertension, sarcoidosis, history of CVA,  vertigo, asthma, and anxiety.   ALLERGIES:  SULFA and IMITREX.   CURRENT MEDICATIONS:  1. Metoprolol succinate 25 mg once daily.  2. Metrazol 20 mg once daily.  3. Diovan 160 mg once daily.  4. Calcium 500 mg three times daily.  5. Potassium chloride extended-release 1 tablet once daily.  6. Zyrtec 10 mg once daily.  7. Protegra once daily.  8. Aspirin 81 mg once daily.  9. Nasacort 2 sprays once daily.  10.Multivitamins once daily.  11.Prednisone 5 mg every other day.  12.Furosemide 20 mg once daily.   SOCIAL HISTORY:  She lives in [place], [place] alone.  She  does not work, but stays busy playing the piano and is very active with  her family members.  [name] and has 4 adult children.   FAMILY HISTORY:  Her mother is alive at the age of 59 and is relatively  healthy with diagnosis of atrial fibrillation.  Her father died at the  age of 36 from a myocardial infarction.  Sounds like she has at least  one sibling who is deceased from the myocardial infarction and others  died of cancers.   REVIEW OF SYSTEMS:  As stated in history present illness is otherwise  negative.   PHYSICAL EXAMINATION:  GENERAL:  She is a pleasant elderly Caucasian  female in no acute distress.  VITAL SIGNS:  Weight is 215 pounds, blood pressure 176/98, pulse 93 and  regular, respirations 12 and unlabored.  NECK:  No JVD.  No carotid bruits.  No lymphadenopathy.  No thyromegaly.  SKIN:  Warm and dry.  OROPHARYNX:  Clear.  Mucous membranes moist.  LUNGS:  Clear to auscultation bilaterally without wheezes, rhonchi, or  crackles noted.  CARDIOVASCULAR:  Regular rate and rhythm without murmurs, gallops, or  rubs noted.  ABDOMEN:  Soft and  nontender.  Bowel sounds are present.  EXTREMITIES:  No evidence of edema bilaterally.  Pulses are 2+ in all  extremities.   DIAGNOSTIC STUDIES:  1. A 12-lead electrocardiogram shows AV pacing.  2. Echocardiogram on August 02, 2008, in the hospital shows low normal      systolic function with an ejection fraction of 50%.  There were no      regional wall motion abnormalities noted.  Left ventricular wall      thickness was mildly to mildly increased.  Left ventricular size      was at the upper limits of normal.  There was mildly increased      aortic valve thickness.  The left atrial was mildly dilated.  3. Left heart catheterization performed on August 03, 2008, showed no      disease in the left main coronary artery.  The LAD had a 30-40%      proximal stenosis.  The circumflex coronary artery had a 30% ostial      stenosis.  The right coronary artery was a large dominant vessel      and had no disease.  Left ventricular function was noted to be 40%      during her left ventricular angiogram.  Left ventricular pressure      was 205/10 with an end-diastolic pressure of 30.   ASSESSMENT AND PLAN:  This is a pleasant 75 year old Caucasian female  with past medical history that includes hypertension, obesity,  obstructive sleep apnea, sarcoidosis, high-degree AV block requiring  placement of permanent pacemaker in August  2009 and a recent left heart  catheterization in August 2009 that showed nonobstructive coronary  artery disease and by echocardiogram was shown to have low normal  systolic function who presents today for a cardiology followup.  The  patient is followed by Dr. Lewayne Bunting in his cardiology clinic.  Dr.  Ladona Ridgel did place her pacemaker last month.  I was involved to perform  her left heart catheterization.  The patient seems to be doing very well  today, and I will make no changes at the current time other than to add  Lipitor 20 mg once daily given her nonobstructive  coronary artery  disease.  She is doing very well from a pacemaker standpoint and has no  evidence of infection around her pacemaker insertion site.  I am a  little concerned that the patient's blood pressure remains elevated.  She is asked that we not make any medication changes today.  She was  instructed to buy a blood pressure cuff at her  pharmacy and document  her blood pressures on  a daily basis for the next several weeks.  If she  continues to have blood pressures that are above 140 systolically, we  will consider changing her medical therapy.  I will have her follow with  Dr. Ladona Ridgel as previously scheduled in December 2009.  The patient is  aware that she should alert our office should she have any change in her  symptoms in the meantime.     Verne Carrow, MD  Electronically Signed    CM/MedQ  DD: 08/25/2008  DT: 08/26/2008  Job #: [id number]   cc:   Doylene Canning. Ladona Ridgel, MD  Rollene Rotunda, MD, [facility]

## 2011-05-02 NOTE — H&P (Signed)
NAMEBALBINA, DEPACE              ACCOUNT NO.:  [id number]   MEDICAL RECORD NO.:  [id number]          PATIENT TYPE:  INP   LOCATION:  1833                         FACILITY:  [facility]   PHYSICIAN:  Thomas C. Wall, MD, FACCDATE OF BIRTH:  1936-07-13   DATE OF ADMISSION:  07/31/2008  DATE OF DISCHARGE:                              HISTORY & PHYSICAL   PRIMARY CARDIOLOGIST:  Doylene Canning. Ladona Ridgel, MD.   PRIMARY CARE PHYSICIAN:  Lonzo Cloud. Kriste Basque, MD.   Deatra JamesSantina Evans A. Orlin Hilding, M.D.   Ms. Brymer is a delightful 75 year old Caucasian female with known  history of pulmonary hypertension and questionable sarcoidosis and  nonobstructive CAD by cath in 2004.  She presents today via EMS from Dr.  Jodelle Green office.  Ms. Stroud was being seen for increased shortness of  breath and increased fatigue x2 weeks.  She was in the office seeing  Rubye Oaks, NP,  and patient was noted to have a slow heart rate.  She was put on the monitor.  Telemetry showed heart rate 30 with  questionable complete heart block.  EMS was called.  The patient was  transferred to [facility].  In reviewing rhythm  strips obtained by EMS, it appears the patient is in second-degree heart  block type 2.  Ms. Lugar states she has been short of breath and more  fatigued over the last couple of weeks.  In further discussion with her,  she also has had episodes of substernal chest discomfort, she describes  as a tightness that is brought on with activity and relieved with rest.  She states the chest discomfort lasts around 5 minutes or so, but has  become more frequent over the last few days.  Thursday, she had arrived  at physical therapy (patient is receiving physical therapy for neck  discomfort and bone spurs).  She states while she was there the physical  therapist noticed she was very weak and more sluggish.  She cancelled  her appointment and told her to get evaluated before she returned to  physical  therapy.  Ms. Sabatino states she is normally very active other  than the chronic neck pain and the chronic dyspnea on exertion.  She  states she is able to maintain her own home.  She also checks on her  mother who is 31,  and does things with her sister.  Previous cardiac  workup includes a stress test in 2004 that was abnormal, which prompted  of cardiac catheterization that showed minimal nonobstructive disease.  Echocardiogram done in 2006 showed EF of 55-65%.  There is a  questionable history of diastolic heart failure.  I cannot find anything  in Dr. Lubertha Basque notes to confirm this.  Unfortunately, Ms. Scarpulla also  has obstructive sleep apnea and is noncompliant with her CPAP because  she states it does not fit her face well.  She will need this followed  up outpatient for further evaluation with Dr. Kriste Basque.   PAST MEDICAL HISTORY:  1. Chronic anxiety.  2. History of DVT requiring Coumadin therapy.  3. The patient suffered a  left cerebral hemorrhage while on Coumadin,      this was stopped.  4. Hypertension.  5. Chronic left bundle branch block.  6. Obesity.  7. Questionable diastolic CHF.  8. Chronic neck pain.  9. Pulmonary hypertension.  10.Questionable sarcoidosis.  11.Nonobstructive CAD.  12.CVA in 2006.  13.Asthmatic bronchitis.  14.History of vertigo.   SOCIAL HISTORY:  She lives in [place], [place], alone.  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  She is a widow.  She has 4 adult children.  Tries to follow a  heart healthy diet.   FAMILY HISTORY:  Mother alive at age 52, sounds like she may have atrial  fibrillation, according to the patient's understanding.  Father deceased  at age 46 secondary to MI.  Siblings deceased secondary to MI and  various cancers.   REVIEW OF SYSTEMS:  Positive for chest pain, shortness of breath,  dyspnea on  exertion, generalized weakness, myalgia, arthralgia and neck  pain.  All other systems reviewed and negative.   ALLERGIES:  Include SULFA and IMITREX.   CURRENT MEDICATIONS:  1. Allergy shot once a week.  2. Zyrtec.  3. Prednisone 5 mg every other day.  4. Aspirin 81 mg daily.  5. Atenolol 50 mg daily.  6. Diovan 160.  7. Lasix 40 mg daily.  8. Klor-Con 20 mEq daily.  9. Multivitamin.  10.Protegra vitamin daily.  11.Tranxene 7.5 mg t.i.d. p.r.n.   PHYSICAL EXAMINATION:  VITAL SIGNS:  Temperature 97, heart rate 33-48,  respirations 18, blood pressure 186/75 and 194/76.  GENERAL:  In no acute distress.  HEENT: Unremarkable.  NECK: Supple without lymphadenopathy, no bruits.  No JVD.  CARDIOVASCULAR:  Exam reveals S1 and S2, irregular rhythm, bradycardic.  LUNGS:  Clear to auscultation.  SKIN:  Warm and dry.  ABDOMEN:  Soft, nontender, positive bowel sounds.  LOWER EXTREMITIES:  Without clubbing or cyanosis.  She has trace of  nonpitting edema in her left ankle.  NEUROLOGICALLY:  Alert and oriented x3.   Chest x-ray showing mild cardiomegaly.  Recommend PA and lateral chest x-  ray if needed based on clinical findings.   EKG:  Second-degree heart block type 2 with frequent PVCs noted PACs.  A  12-lead EKG at a rate of 33 and 46.  Repeat EKG.   H&H 14.1 and 42.3 WBCs 8.5, platelets 221,000.  Sodium 142, potassium  4.5, chloride 108, CO2 24, BUN 19, creatinine 0.9 with a glucose of 97.  AST and ALT 23 and 18, respectively.  First set of cardiac enzymes  negative.  PT/INR 29 and 1.   IMPRESSION:  1. Second-degree heart block.  2. Chronic left bundle branch block.  3. Nonobstructive coronary artery disease.  The patient now presenting      with episodes of substernal chest discomfort x2 weeks relieved with      rest.  4. Increased dyspnea and increased fatigue.  5. Obstructive sleep apnea with noncompliance to CPAP.  6. Family history of premature coronary artery  disease.   PLAN:  Admit patient to step-down, hold the atenolol, check a TSH.  She  will need a cardiac catheterization for further evaluation of her chest  discomfort.  EP will be asked to consult depending on results of cath.  I have already spoke with Loura Pardon for Dr. Ladona Ridgel.  We will see  what  Ms. Link's heart rhythm does over the weekend without her beta blocker  on board.  She is agreeable to cardiac catheterization.  The risks and  benefits have been discussed with patient.  Will proceed with cath on  Monday.  Dr. Juanito Doom has been in to examine and assess the patient and  agrees with plan of care.      Dorian Pod, ACNP      Jesse Sans. Daleen Squibb, MD, [facility]  Electronically Signed    MB/MEDQ  D:  07/31/2008  T:  07/31/2008  Job:  [id number]

## 2011-05-02 NOTE — Procedures (Signed)
NAMEJOVONNE, [name] [name]              ACCOUNT NO.:  [id number]   MEDICAL RECORD NO.:  [id number]          PATIENT TYPE:  OUT   LOCATION:  SLEEP CENTER                 [facility]ACILITY[facility]   PHYSICIAN:  Barbaraann Share, MD,FCCPDATE OF BIRTH:  1936-01-27   DATE OF STUDY:  08/05/2009                            NOCTURNAL POLYSOMNOGRAM   REFERRING PHYSICIAN:  Barbaraann Share, MD,FCCP   LOCATION:  Sleep lab.   REFERRING PHYSICIAN:  Barbaraann Share, MD, FCCP   INDICATION FOR STUDY:  Hypersomnia with sleep apnea.   EPWORTH SLEEPINESS SCORE:  8.   MEDICATIONS:   SLEEP ARCHITECTURE:  The patient had a total sleep time of 245 minutes  with no slow wave sleep and only 50 minutes of REM.  Sleep onset latency  was prolonged at 88 minutes, and REM onset was normal at 82 minutes.  Sleep efficiency was very poor at 55%.   RESPIRATORY DATA:  The patient was found to have 15 apneas and 46  hypopneas, giving her an AHI of 15 events per hour.  The events were not  positional and there was mild snoring noted throughout.   OXYGEN DATA:  There was O2 desaturation as low as 83% with the patient's  obstructive events.   CARDIAC DATA:  Occasional PVC noted, but no clinically significant  arrhythmia seen.   MOVEMENT-PARASOMNIA:  No significant leg jerks or abnormal behaviors  were noted.   IMPRESSIONS-RECOMMENDATIONS:  1. Mild obstructive sleep apnea with an AHI of 15 events per hour and      oxygen desaturation as low as 83%.  Treatment for this degree of      sleep apnea can include a trial of weight loss alone if applicable,      upper airway      surgery, dental appliance, and also CPAP.  Clinical correlation is      suggested.  2. Occasional premature ventricular contraction without clinically      significant arrhythmia.      Barbaraann Share, MD,FCCP  Diplomate, American Board of Sleep  Medicine  Electronically Signed     [facility]/MEDQ  D:  08/17/2009 16:21:06  T:  08/18/2009 04:42:57  Job:   [id number]

## 2011-05-02 NOTE — Assessment & Plan Note (Signed)
[facility]                         ELECTROPHYSIOLOGY OFFICE NOTE   NAME:[name] [name], [name] [name]                     MRN:          [id number]  DATE:11/17/2008                            DOB:          October 24, 1936    [name] returned today for followup.  She is a very pleasant woman  with a history of complete heart block and hypertension.  She previously  had left bundle-branch block.  She has not had any significant  obstructive coronary disease.  She returns today for followup.  She has  multiple aches.  She stated that she fell after a trip misstepping on a  stair.  She returns today for followup.   Her medications include:  1. Metoprolol 25 a day.  2. Omeprazole 20 a day.  3. Lipitor 20 a day.  4. Diovan 160 a day.  5. Calcium supplement.  6. Potassium supplement.  7. Zyrtec 10 a day.  8. Protegra daily.  9. Aspirin 81 a day.  10.Prednisone 5 every other day.  11.Furosemide 20 mg 1-1/2 tablets daily.   On exam, she is a pleasant woman in no acute distress.  Blood pressure  was 170/90, the pulse 88 and regular, respirations were 18, and the  weight was 214 pounds.  Review of her blood pressure log demonstrates  that most of her systolic blood pressures have been between 120 and 140.  Lungs clear bilaterally to auscultation.  No wheezes, rales or rhonchi  are present.  Cardiac exam revealed a regular rate and rhythm.  Normal  S1 and S2.  Abdominal exam was soft and nontender.  Extremities  demonstrated no edema.   Interrogation of her pacemaker demonstrates a [employer] [place].  P-waves  greater than 2, the R-waves 16, the impedance 500 in the A and 630 in  the V, the threshold 0.375 at 0.4 in the A and 0.75 at 0.4 in the RV.  Battery voltage was 2.8 V.  Underlying rhythm was sinus.  She had V  paced 72% of the time.   IMPRESSION:  1. Hypertension, worse in doctor's offices, otherwise fairly stable on      medical therapy.  2. Complete heart  block status post pacemaker insertion.  3. Muscle aches and fatigue with question whether this is related to      her statin therapy.  4. Dyslipidemia.   DISCUSSION:  I have asked Ms. Gehres to switch from Lipitor to Pravachol  today in hopes that we might be able to reduce her muscle aches and  fatigue.  Her blood pressure is well controlled  at home and so I will not make any changes in her blood pressure  medicines today.  We will see the patient back for pacemaker followup in  August 2010.     Doylene Canning. Ladona Ridgel, MD  Electronically Signed    GWT/MedQ  DD: 11/17/2008  DT: 11/17/2008  Job #: [id number]

## 2011-05-05 NOTE — Discharge Summary (Signed)
[name] [name], [name] [name]                        ACCOUNT NO.:  [id number]   MEDICAL RECORD NO.:  [id number]                   PATIENT TYPE:  OIB   LOCATION:  2025                                 FACILITY:  [facility]   PHYSICIAN:  Doylene Canning. Ladona Ridgel, M.D.               DATE OF BIRTH:  1936-04-04   DATE OF ADMISSION:  08/26/2003  DATE OF DISCHARGE:  08/27/2003                           DISCHARGE SUMMARY - REFERRING   PROCEDURE:  Cardiac catheterization, August 26, 2003.   REASON FOR ADMISSION:  Please refer to the dictated admission note.   LABORATORY DATA:  The pH was 7.39, pCO2 46, pO2 70 and HCO3 28 (2 liters).  INR of 1.1.  Admission chest x-ray; no congestive heart failure, right lung  base streaky density (probably chronic, but question acute disease).   HOSPITAL COURSE:  The patient presented for elective coronary angiography  following a recent office presentation to Dr. Lewayne Bunting for evaluation of  progressive dyspnea/chest tightness.  She was referred for a stress  Cardiolite test suggesting anterior MI/peri-infarct ischemia; calculated  ejection fraction 33%.  The patient has a previous history of normal left  ventricular function.   Cardiac catheterization was performed by Dr. Shawnie Pons (see report for  full details).  This revealed nonobstructive coronary artery disease with a  20% proximal LAD and otherwise normal circumflex and RCA.  Left  ventriculogram; global hypokinesis, EF 46% and 2+ mitral regurgitation.  Dr.  Riley Kill questioned further workup consisting of either transesophageal  echocardiogram or CT angiogram.  No further testing, however, ws ordered  during the patient's brief stay.  The patient was placed back on previous  home medication regimen including Coumadin.   The patient was kept for overnight observation and was cleared for discharge  the following morning if she was in hemodynamically stable conditio.  The  right groin was stable on exam.   DISCHARGE MEDICATIONS:  1. Coumadin as previously directed.  2. Aspirin 81 mg daily.  3. Diovan 80 mg daily.  4. Triamterene/HCTZ 37.5/25 mg daily.  5. Clorazepate 7.5 mg t.i.d. p.r.n.   DISCHARGE INSTRUCTIONS:  1. Pro time check on Monday, September 13th at 9:15 A.M.  2. Follow up with Dr. Lewayne Bunting on Wednesday the 29th at 4 P.M.  3. The patient is to refrain from any heavy lifting times two days.  4. Maintain low fat/cholesterol diet.  5. Call the cardiology office if there is any swelling or bleeding in the     groin.   DISCHARGE DIAGNOSES:  1. Nonischemic cardiomyopathy.     A. Nonobstructive coronary artery disease, mild left ventricular        dysfunction, ejection fraction 46% - coronary angiogram September        08th.     B. Recent false positive stress Cardiolite.  2. History of syncope.  3. Chronic Coumadin.  4. Chronic left bundle branch block.  5. Mild mitral  regurgitation.  6. Obesity.        Gene Serpe, P.A. [facility]                      Doylene Canning. Ladona Ridgel, M.D.    GS/MEDQ  D:  08/27/2003  T:  08/27/2003  Job:  [id number]

## 2011-05-05 NOTE — Consult Note (Signed)
[name] [name], [name] [name]              ACCOUNT NO.:  [id number]   MEDICAL RECORD NO.:  [id number]          PATIENT TYPE:  INP   LOCATION:  3304                         FACILITY:  [facility]   PHYSICIAN:  Hewitt Shorts, M.D.DATE OF BIRTH:  Aug 10, 1936   DATE OF CONSULTATION:  09/13/2005  DATE OF DISCHARGE:                                   CONSULTATION   HISTORY OF PRESENT ILLNESS:  The patient is a 75 year old right-handed white  female for a neurosurgical consultation that was requested by C. Lesia Sago, M.D., regarding a left cerebellar hematoma.   Her history is notable for having spontaneously developed 11 days ago acute  left occipital headache associated with nausea and vomiting.  This developed  on September 16.  By the following morning she felt ill enough that she  asked her mother to call EMS and she was brought to the [facility] emergency room, evaluated by the emergency room physician.  They  obtained an I-STAT, which showed normal electrolytes and hemoglobin and  hematocrit as well as coagulation profile with PT of 25.6, INR of 2.3 and  cardiac markers with a normal CK-MB and troponin.  The patient was diagnosed  with gastroenteritis and was treated with IV fluids (normal saline) and  Phenergan.  It should be noted that vital signs showed a normal temperature  of 98.3 with normal pulse and blood pressure.  It should be further noted  that the patient had a notable history of hypertension and a history of deep  vein thrombosis and has been on Coumadin for many years.   In any event, the patient was discharged to home from the emergency room  after less than a four-hour emergency room stay.  She continued to rest at  home and continued to complain of left occipital headache and nausea.  She  ate a BRAT diet to help control the nausea but because of persistent  symptoms, she went on to see Dr. Alroy Dust, her primary physician, on  September 21, and he  referred her for reevaluation with Santina Evans A.  Orlin Hilding, M.D., who had previously evaluated the patient and who saw the  patient yesterday and obtained an MRI and MRA at [facility] today.   That study revealed a left cerebellar hematoma, and the patient was  transferred to the emergency room for further evaluation.   The patient was seen by C. Lesia Sago, M.D., from the stroke neurology  service and is being admitted by Dr. Anne Hahn for further evaluation and care.  Dr. Anne Hahn has requested a neurosurgical consultation.   Symptomatically, the patient continues to complain of headache and nausea.  She has not had any further vomiting, though.  She denies any diplopia but  does describe occasional blurring of vision.  She denies any focal weakness;  however, she says that although she had had previous mild unsteadiness which  she had been evaluated for by Dr. Orlin Hilding, she has been significantly more  unsteady ever since this stroke occurred 11 days ago and has needed  assistance in being able to get up and  move about, and overall the  unsteadiness is much worse than it had been previously.   Laboratories today include a CBC which shows a normal white blood cell count  of 9.3, normal hemoglobin and hematocrit.  The PT is now 30.7, INR is down  to 2.9, and the PTT is 35.  Dr. Anne Hahn has initiated treatment with vitamin  K and at my request is transfusing two units of fresh frozen plasma as soon  as they are available.   I have had an opportunity to review her MRI and MRA with Dr. Doneta Public, the  interpreting neuroradiologist, as well as with Dr. Anne Hahn.  Dr. Lacy Duverney is  concerned that there is a small possibility of the hemorrhage having  occurred from a left PICA aneurysm (distally located off of the PICA) and  favors the patient undergoing cerebral arteriogram to rule out an aneurysm.  This has been discussed with Dr. Anne Hahn and the arteriogram has been  ordered.   PAST  MEDICAL HISTORY:  Notable for a longstanding history of hypertension  that she says has been treated for decades.  Also, she had deep vein  thrombosis.  She is unsure of how long ago that occurred but she thinks it  is at least five to 10 years ago.  She has no history of myocardial  infarction, cancer, previous stroke, diabetes, peptic ulcer disease or lung  disease.   Previous surgery includes two lumbar surgeries by Dr. Jacqulyn Bath and his partners,  knee surgery, cholecystectomy, appendectomy and hysterectomy.   She reports allergies to SULFA and IMITREX.   CURRENT MEDICATIONS:  1.  Coumadin.  2.  Atenolol 50 mg daily.  3.  Diovan 160 mg daily.  4.  Dyazide 37.5/25 mg daily.  5.  Os-Cal 500 mg daily.  6.  Aspirin 81 mg daily.  7.  Nasonex two squirts per nostril daily.  8.  Topamax Sprinkles 15 mg two q.p.m.  9.  Zyrtec 10 mg daily.  10. Protegra.   FAMILY HISTORY:  Her mother is in good health at age 85.  Her father and her  brother died of heart attack.  Her sister had kidney cancer.   SOCIAL HISTORY:  The patient is widowed.  She has children and  grandchildren.  She does not smoke, drink alcoholic beverages or have a  history of substance abuse.  She is retired.   REVIEW OF SYSTEMS:  Notable for those as described in the history of present  illness and past medical history and is otherwise unremarkable.   PHYSICAL EXAMINATION:  GENERAL:  The patient is a well-developed, well-  nourished white female in no distress.  VITAL SIGNS:  Her temperature is 97.7, pulse 75, blood pressure 152/77,  respiratory rate 16.  CHEST:  Lungs are clear to auscultation.  She has symmetrical respiratory  excursion.  CARDIAC:  Regular rate and rhythm with S1, S2, there is no murmur.  ABDOMEN:  Soft, nontender, bowel sounds present.  EXTREMITIES:  No clubbing, cyanosis, or edema.  NEUROLOGIC:  Mental status:  The patient is awake, alert.  She is oriented to her name, [name] [name], and  September 13, 2005.  She follows  commands.  Her speech is fluent.  She has good comprehension.  Cranial  nerves show her pupils equal, round, and reactive to light and about 3 mm  bilaterally.  Extraocular movements are intact.  Facial sensation is intact.  Facial movement is symmetrical.  Hearing is present bilaterally.  Palatal  movement is symmetrical, shoulder shrug is symmetrical, and tongue is  midline.  Motor examination shows 5/5 strength in the upper and lower  extremities.  She has no drift in the upper extremities.  Sensation is  intact to pinprick in the upper and lower extremities.  Reflexes are  symmetrical.  Toes are downgoing.  Gait and stance were not tested.  Cerebellar testing shows mild dysmetria on finger-to-nose testing on the  left, no dysmetria on the right on finger-to-nose testing.   DIAGNOSTIC STUDIES:  MRI of the brain and MRA of the brain done today at  [facility] were reviewed with Dr. Doneta Public.  The study shows a  large left cerebellar hematoma with mild compression of the brainstem.  However, there is no hydrocephalus and Dr. Lacy Duverney has raised the question of  possible distal left PICA aneurysm.   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  The patient is  neurologically intact except for a mild left dysmetria on finger-to-nose  testing.  She has no hydrocephalus.  She does have iatrogenic coagulopathy  with supertherapeutic anticoagulation with Coumadin.   RECOMMENDATIONS:  1.  Reversal of anticoagulation has been initiated by Dr. Anne Hahn.  Further      orders for vitamin K have been written as well as follow-up coagulation      profile, with possible further transfusions of fresh frozen plasma if      anticoagulation is not fully and adequately reversed.  2.  Cerebral arteriography to rule out left PICA aneurysm.  3.  Follow-up CT without contrast in the morning to rule out the interval      development of hydrocephalus.       Hewitt Shorts, M.D.  Electronically Signed     RWN/MEDQ  D:  09/13/2005  T:  09/14/2005  Job:  [id number]   cc:   Marlan Palau, M.D.  Fax: [phone]   Lonzo Cloud. Kriste Basque, M.D. [facility]  [address]  [place]  [place] [address]

## 2011-05-05 NOTE — H&P (Signed)
NAMELYNDZEE, KLIEBERT              ACCOUNT NO.:  [id number]   MEDICAL RECORD NO.:  [id number]          PATIENT TYPE:  IPS   LOCATION:  NA                           FACILITY:  [facility]   PHYSICIAN:  Ranelle Oyster, M.D.DATE OF BIRTH:  July 09, 1936   DATE OF ADMISSION:  DATE OF DISCHARGE:                                HISTORY & PHYSICAL   CHIEF COMPLAINT:  Left-sided incoordination and loss of balance.   HISTORY OF PRESENT ILLNESS:  This is a 75 year old white female with a  history of DVT on chronic Coumadin who developed nausea, vomiting, and  headache on September 02, 2005. She was initially diagnosed with  gastroenteritis and discharged home. Continued symptoms and return visit to  the ER resulted in an MRI/MRA of the brain which was done which revealed a  cerebella hematoma with mid mild brainstem compression. The patient was  given vitamin K and fresh frozen plasma. Neurosurgery was consulted and  recommended cerebral angiography. The cerebral angiography was negative.  Conservative treatment was initiated. The patient has been improving from a  mobility standpoint but still has significant ataxia and balance loss to the  left associated with vertigo.   REVIEW OF SYSTEMS:  Positive for weakness, low back pain, headache,  dizziness. Nausea and vomiting is decreased. She has had some decreased  hearing. Full review of systems is in a written H&P.   PAST MEDICAL HISTORY:  Positive for DVT, obesity, hypertension, coronary  artery disease, lumbar spine surgery x2, left knee arthroscopic surgery,  pulmonary hypertension, appendectomy, cholecystectomy, history of balance  problems.   FAMILY HISTORY:  Positive for MI and renal cancer.   SOCIAL HISTORY:  The patient is widowed, lives alone, was independent and  driving until 2 weeks prior to arrival. She does not smoke or drink. Family  can provide some assistance at home.   MEDICATIONS PRIOR TO ARRIVAL:  Include Coumadin, atenolol,  Tranxene, Zyrtec,  Diazide, Os-Cal, multivitamin, aspirin, Topamax.   ALLERGIES:  To SULFA and IMITREX.   LABORATORY DATA:  Includes hemoglobin 14.6, white count 9.3, platelets 259.  Sodium 137, potassium 3.4, BUN and creatinine 20 and 1.5, glucose 125. INR  2.9.   PHYSICAL EXAMINATION:  VITAL SIGNS:  Blood pressure is 140/87, pulse is 84,  temperature 98.4, respiratory rate 16.  GENERAL:  The patient is generally pleasant, no acute distress. She is  obese.  HEENT:  Pupils equal, round, and reactive to light and accommodation,  Extraocular eye movements are grossly intact.  NECK:  Supple without JVD or lymphadenopathy.  CHEST:  Clear to auscultation bilaterally without wheezes, rales, or  rhonchi.  HEART:  Regular rate and rhythm without murmur, rubs, or gallops.  EXTREMITIES:  Showed no clubbing, cyanosis, and only trace edema in the  distal lower extremities.  ABDOMEN:  Soft, nontender, bowel sounds are positive.  NEUROLOGIC:  Cranial nerves II-XII are intact. Reflexes are 2+. Sensation  was normal to pinprick, light touch, and proprioception. Judgment,  orientation, memory were intact. The patient had some discoordination of the  left upper and lower extremity with fine motor movements. She had  negative  pronator drift. No obvious nystagmus was noted with head turning or  repositioning of the torso today. Motor function was generally 4+ out of 5  on the right and 4 out of 5 on the left today.   ASSESSMENT AND PLAN:  1.  Functional deficit secondary to left cerebellar hemorrhage with ataxia,      positional vertigo, and nausea and vomiting. The patient is gradually      improving but still in need of physical assistance for basic self care,      mobility tasks. Begin comprehensive inpatient rehab to include PT, OT,      M.D., R.N. case manager, and therapeutic recreation. Estimated length of      stay is 10-14 days. Prognosis fair.  2.  Pain management with oxycodone p.r.n.   3.  DVT prophylaxis with PAS hose.  4.  Pulmonary hypertension. Continue Maxzide, Diovan, and Tenormin.  5.  Neurologic:  Recheck head CT without contrast on October 3.  6.  Coronary artery disease:  Continue medications as listed above.  7.  Vestibular symptoms:  Vestibular evaluation ordered. The patient will      use Zofran p.r.n. for nausea, although this has generally been      improving.      Ranelle Oyster, M.D.  Electronically Signed     ZTS/MEDQ  D:  09/15/2005  T:  09/15/2005  Job:  [id number]

## 2011-05-05 NOTE — Letter (Signed)
August 17, 2006      RE:  BRITTA, LOUTH  MRN:  [id number]  /  DOB:  [birth year]   To Whom It May Concern:   [name] is a 75 year old patient of mine who follows up for  general medical purposes.  She has multiple medical problems and is on a  number of medications.  Amongst these medical problems include a history of  low back pain with two lumbar laminectomies in the past and considerable  discomfort and difficulty getting around.  She also is overweight and has  had quite a bit of difficulty losing weight due to her pain and back  problems.   It has come to my attention that [name] has found a Systems analyst in  [employer] at [employer], and he has been instrumental in getting her  into a good exercise program, physical therapy program, and diet counseling.   The patient indicated to me that she has read her insurance company manuals  through [employer] and her [employer].  They indicate that  if I will recommend Mr. Kyung Rudd and his program of physical therapy,  exercise therapy, and nutrition management, that this request will be  honored.  This letter is exactly that request, asking Medicare and [place]  [employer] to approve of [name]'s choice of personal trainer to do  physical therapy, exercise therapy, and nutrition management.   Thank you very much for your consideration in this matter.  If I can be of  further assistance, please feel free to contact my office.    Sincerely,      Lonzo Cloud. Kriste Basque, MD   SMN/MedQ  DD:  08/17/2006  DT:  08/18/2006  Job #:  [id number]   CC:    [name]

## 2011-05-05 NOTE — Discharge Summary (Signed)
[name], [name]              ACCOUNT NO.:  [id number]   MEDICAL RECORD NO.:  [id number]          PATIENT TYPE:  IPS   LOCATION:  NA                           FACILITY:  [facility]   PHYSICIAN:  Pramod P. Pearlean Brownie, MD    DATE OF BIRTH:  29-Jun-1936   DATE OF ADMISSION:  09/13/2005  DATE OF DISCHARGE:  09/15/2005                                 DISCHARGE SUMMARY   DISCHARGE DIAGNOSES:  1.  Left cerebellar hemorrhage.  2.  Hypertension.  3.  Chronic Coumadin therapy prior to admission managed by Dr. Lewayne Bunting.      Patient on Coumadin for pulmonary hypertension felt to be secondary to      undocumented pulmonary embolus.  4.  Obesity.  5.  Coronary artery disease.  6.  History of deep vein thrombosis.  7.  Cholecystectomy.  8.  Appendectomy.  9.  Lumbosacral spinal surgery x2.  10. History of left knee arthroscopic surgery.   DISCHARGE MEDICATIONS:  1.  Tenormin 50 mg daily.  2.  Diovan 160 mg daily.  3.  Tranxene 7.5 mg t.i.d.  4.  Calcium carbonate 500 mg tablets b.i.d.  5.  Multivitamin daily.  6.  Nasonex 50 mcg nasal spray b.i.d.  7.  Triamterene hydrochlorothiazide 37.5/25 daily.  8.  Zyrtec 10 mg daily.   STUDIES:  1.  MRI done prior to admission shows a left cerebellar hemorrhage with some      mild brain stem compression.  2.  Cerebral angiogram shows no focal aneurysm or vascular malformation as      etiology of patient's left cerebellar hemorrhage.  Unremarkable cerebral      angiogram.  3.  CT of the head on September 14, 2005, shows stable appearance of the      left cerebellar hemorrhage.  No new infarct or additional hemorrhage.   LABORATORY STUDIES:  CBC with MCV 87.5.  Chemistry with glucose 106,  creatinine at one point was 1.5 but dropped to 1.0.  PT on admission was  2.9.  The next day had dropped to 1.2.  Homocysteine 12.14, cholesterol 172,  triglycerides 111, HDL 34, LDL 116.   HISTORY OF PRESENT ILLNESS:  [name] is a 75 year old  right  handed white female with history of hypertension and chronic Coumadin  therapy secondary to pulmonary hypertension thought to be secondary to  unrecognized pulmonary embolus.  The patient initially noticed sudden onset  of occipital headache about 10 days prior to admission with significant  nausea and vomiting and worsening of her gait disturbance.  She was seen in  the [facility] Emergency Room and evaluated for nausea and vomiting.  She  was treated with Phenergan and GI followup.  The patient was discharged home  but has had trouble walking with her cane ever since and has had at last one  fall at home bumping her head.  The patient was seen by Dr. Kriste Basque and  referred to Dr. Orlin Hilding.  Dr. Orlin Hilding saw her in the office with complaints  of vertigo and gait disturbance with these episodes.  She ordered  an  outpatient MRI.  MRI showed a left cerebellar intracranial hemorrhage with  some mild brainstem compression.  Clinically, the patient had a previous  onset of headache and gait disturbance 10 days ago.  She denies double  vision, focal numbness or weakness of the arms or legs other than some  tingling sensation in the left hand associated with the use of Topamax.  The  patient does note that when she is working she tends to stagger either way,  not just to the left or right.  The patient is being admitted to the  hospital for observation and to reverse the Coumadin.  Hydrocephaly also  found apparent on the MRI.   HOSPITAL COURSE:  Coumadin was reversed with PT dropping to 1.2 the  following day.  Dr. Kriste Basque was asked to see the patient as he is her primary  care physician.  Use of Coumadin was found to be secondary to pulmonary  hypertension and treated by Dr. Lewayne Bunting.  He is using Coumadin as the  cause of the pulmonary hypertension that was secondary to unrecognized  pulmonary embolus.  At this point, he is agreeable with discontinuing  Coumadin and sees no further  indication for it.  Throughout hospitalization,  CT of the head has remained stable.  She was evaluated for PT/OT and felt  rehab was a viable option for her at discharge.  She will be discharged  there to increase __________ and then return home.  Her blood pressure needs  to remain normotensive with goal systolic blood pressure less than 130.  We  will not resume Coumadin.  We will follow with CT of the head in a few days.   CONDITION ON DISCHARGE:  Patient alert and oriented x3.  No aphagia or  dysarthria.  She has bilateral nystagmus with end gaze, especially on the  left.  She has normal finger, nose and heel-shin ataxia.  She has symmetric  strength, symmetric tone, symmetric reflexes.  She ambulates well with  walker.   DISCHARGE PLAN:  1.  Transfer to rehabilitation for continuation of PT and OT.  2.  Goal systolic blood pressure less than 130.  3.  No future indication for Coumadin.  4.  Resume aspirin 81 mg in two months.  5.  Repeat CT of the head October 3, 206.  6.  Follow up with Dr. Kriste Basque within one month.  7.  Follow up with Dr. Lewayne Bunting after discharge.  8.  Follow up with Dr. Delia Heady in 2-3 months after discharge.      [name], N.P.    ______________________________  Sunny Schlein. Pearlean Brownie, MD    SB/MEDQ  D:  09/15/2005  T:  09/15/2005  Job:  [id number]   cc:   Pramod P. Pearlean Brownie, MD  Fax: [phone]   Doylene Canning. Ladona Ridgel, M.D.  [address]  [address]  [place]  [place] [address]   Lonzo Cloud. Kriste Basque, M.D. [facility]  [address]  [place]  [place] [address]

## 2011-05-05 NOTE — Cardiovascular Report (Signed)
[name] [name], [name] [name]                        ACCOUNT NO.:  [id number]   MEDICAL RECORD NO.:  [id number]                   PATIENT TYPE:  OIB   LOCATION:  2853                                 FACILITY:  [facility]   PHYSICIAN:  Arturo Morton. Riley Kill, M.D.             DATE OF BIRTH:  Jan 16, 1936   DATE OF PROCEDURE:  08/26/2003  DATE OF DISCHARGE:                              CARDIAC CATHETERIZATION   INDICATIONS:  The patient is a very pleasant 75 year old female who has a  history of left bundle branch block, hypertension with a positive family  history.  She has had some shortness of breath and chest pain and also had  some syncope.  She has been seen by Olga Millers, M.D. and subsequently  referred for further evaluation including cardiac catheterization.  Risks,  benefits, and alternatives were discussed with the patient.  The patient's  Coumadin was withheld.  Her INR today was less than 1.6.   PROCEDURE:  1. Right and left heart catheterization.  2. Selective coronary arteriography.  3. Selective left ventriculography.  4. Aortic root aortography.   DESCRIPTION OF PROCEDURE:  The patient was brought to the catheterization  laboratory and prepped and draped in the usual fashion.  Through an anterior  puncture the right femoral vein was entered.  An 8-French sheath was placed.  Right heart catheterization was performed using 7.5-French thermodilution  Swan-Ganz catheter.  This was performed without complications.  Following  this a left heart catheter was placed and pigtail placed in the aorta and  left ventricle.  Simultaneous LV wedge pressures were obtained.  Ventriculography was performed in the RAO projection and this was followed  by aortography.  Coronary arteriography was then performed without  complication.  All catheters were subsequently removed and hemostasis  achieved in the holding area.   HEMODYNAMIC DATA:  1. Right atrium 11.  2. Right ventricle 48/13.  3.  Pulmonary artery 50/24.  4. Pulmonary capillary wedge 20.  5. Aortic 168/92.  6. Left ventricle 161/19.  7. No gradient on pullback across the aortic valve.  8. SVC saturation 57%.  9. Pulmonary artery saturation 64%.  10.      Aortic saturation 93%.  11.      Thermodilution cardiac output 5.6 L/minute.  12.      Thermodilution cardiac index 2.7 L/minute/sq m.   ANGIOGRAPHIC DATA:  1. Ventriculography was performed in the RAO projection.  There was global     hypokinesis.  Ejection fraction was calculated at 46%.  There was     slightly more hypokinesis of the mid anterolateral wall.  There was some     mitral regurgitation which was difficult to grade, but would be graded at     1-2+.  2. The aortic root demonstrated no evidence of dissection and no evidence of     significant aortic regurgitation.  3. The left main coronary artery was free of  critical disease.  4. The LAD has about 20% eccentric plaquing.  No other areas of high grade     disease are noted.  The distal vessel wraps the apex.  5. The circumflex provides a first marginal that is smaller and then two     large distal marginals, all of which are free of critical disease.  6. The right coronary artery is a large, dominant vessel providing a     dominant PDA.  Posterolateral system is relatively small, but without     critical disease as well.   CONCLUSIONS:  1. No evidence of aortic regurgitation or dissection.  2. Pulmonary hypertension, moderate, of uncertain etiology with difference     between pulmonary artery diastolic and pulmonary wedge pressures.  3. Mild to moderate mitral regurgitation noted by contrast.  4. Moderate left ventricular dysfunction.   DISPOSITION:  I have reviewed the films with Doylene Canning. Ladona Ridgel, M.D.  She will  see Doylene Canning. Ladona Ridgel, M.D. back in follow-up.  She will be restarted back on  her Coumadin.  CT scan may eventually be obtained to rule out chronic  pulmonary emboli.  She may also  require a sleep study.  Finally, TEE may be  required to better evaluate mitral regurgitation.  This will be determined  by Doylene Canning. Ladona Ridgel, M.D. when he sees her back in follow-up.                                               Arturo Morton. Riley Kill, M.D.    TDS/MEDQ  D:  08/26/2003  T:  08/26/2003  Job:  [id number]   cc:   Doylene Canning. Ladona Ridgel, M.D.   [facility]

## 2011-05-05 NOTE — Discharge Summary (Signed)
   NAMEGEORGIANN, [name] [name]                        ACCOUNT NO.:  [id number]   MEDICAL RECORD NO.:  [id number]                   PATIENT TYPE:  OIB   LOCATION:  2025                                 FACILITY:  [facility]   PHYSICIAN:  Doylene Canning. Ladona Ridgel, M.D.               DATE OF BIRTH:  January 20, 1936   DATE OF ADMISSION:  08/26/2003  DATE OF DISCHARGE:  08/27/2003                           DISCHARGE SUMMARY - REFERRING   PROCEDURES:  Cardiac catheterization on September 8.   REASON FOR ADMISSION:  Please refer to dictated admission note.   LABORATORY DATA:  pH 7.39, pCO2 46, pO2 70, HCO3 28 (2 liters), INR 1.1.  Dictation ends at this point.      Gene Serpe, P.A. [facility]                      Doylene Canning. Ladona Ridgel, M.D.    GS/MEDQ  D:  08/27/2003  T:  08/27/2003  Job:  [id number]

## 2011-05-05 NOTE — Op Note (Signed)
[name] [name], [name] [name]              ACCOUNT NO.:  [id number]   MEDICAL RECORD NO.:  [id number]          PATIENT TYPE:  AMB   LOCATION:  DSC                          FACILITY:  [facility]   PHYSICIAN:  Currie Paris, M.D.DATE OF BIRTH:  May 07, 1936   DATE OF PROCEDURE:  04/22/2007  DATE OF DISCHARGE:                               OPERATIVE REPORT   OFFICE MEDICAL RECORD NUMBER [phone].   PREOPERATIVE DIAGNOSIS:  Lymphadenopathy.   POSTOPERATIVE DIAGNOSIS:  Lymphadenopathy.   OPERATION:  Excision left supraclavicular lymph node.   SURGEON:  Currie Paris, M.D.   ANESTHESIA:  MAC.   CLINICAL HISTORY:  This is a 75 year old lady recently found to have  significant enlarged both calcified and noncalcified lymph nodes.  It  was unclear whether this represented inflammatory or malignancy.  Excisional biopsy was recommended and the patient agreed to proceed.   DESCRIPTION OF PROCEDURE:  The patient was seen in the preoperative area  and the lymph nodes in question were marked by the patient and myself.   The patient was then taken to the operating room.  After satisfactory  general anesthesia obtained the left supraclavicular area was prepped  and draped as a sterile field.  The time-out occurred.   I identified the node again with the patient positioned and made a mark  directly over it in a skin fold for the incision.  I then injected some  Marcaine to help with postop analgesia.  I made a transverse incision,  divided a little bit of subcutaneous tissue.  With some very blunt  careful dissection I was able to excise about 2 cm hard lymph node that  appeared pathologic.  There were several others but I thought we had  adequate tissue with a single node.  I used cautery to catch any blood  vessels, but there is no significant bleeding noted.   I took a small piece of the specimen, cut it off and sent it for routine  acid-fast and fungal cultures.  The remainder went in saline  to  pathology for evaluation.   Everything appeared to be dry so I closed incision with some 3-0 Vicryl,  4-0 Monocryl subcuticular plus Dermabond.   The patient tolerated the procedure well.  There no operative  complications and all counts were correct.      Currie Paris, M.D.  Electronically Signed     CJS/MEDQ  D:  04/22/2007  T:  04/22/2007  Job:  [id number]   cc:   Lonzo Cloud. Kriste Basque, MD

## 2011-05-05 NOTE — Discharge Summary (Signed)
[name] [name], [name] [name]              ACCOUNT NO.:  [id number]   MEDICAL RECORD NO.:  [id number]          PATIENT TYPE:  IPS   LOCATION:  4003                         FACILITY:  [facility]   PHYSICIAN:  Ranelle Oyster, M.D.DATE OF BIRTH:  [birth year]   DATE OF ADMISSION:  09/15/2005  DATE OF DISCHARGE:  09/21/2005                                 DISCHARGE SUMMARY   DISCHARGE DIAGNOSES:  1.  Left cerebellar bleed with ataxia.  2.  Vestibular symptoms improving.  3.  Hypertension.  4.  Hypokalemia resolved.   HISTORY OF PRESENT ILLNESS:  Ms. Doody is a 75 year old female with history  of DVT, chronic Coumadin who developed nausea, vomiting, headaches.  On  September 02, 2005, she was evaluated by ED and discharged to home with  diagnosis of gastroenteritis.  As patient continued with headache, nausea  and vomiting, MRI/MRA was done by neurology on September 13, 2005, that  revealed cerebellar hematoma with mild brain stem compression.  Patient was  treated with vitamin K and fresh frozen plasma per Dr. Earl Gala input.  Cerebral angio done was negative for PC aneurysm.  Neurology recommends no  aspirin for two months and no Coumadin indefinitely.  Patient continues with  decreased balance and nausea but vomiting has resolved.  Recommendations for  follow-up CT next week to rule out delayed hydrocephalus.  Therapies  initiated and patient is noted to have a decrease in safety awareness with  decreased balance.  Rehab was consulted for further therapy.   PAST MEDICAL HISTORY:  1.  DVT.  2.  Obesity.  3.  Hypertension.  4.  Coronary artery disease.  5.  LS surgery x2.  6.  Left knee scope.  7.  Pulmonary hypertension.  8.  Appendectomy.  9.  Cholecystectomy and balance problems.   ALLERGIES:  SULFA and IMITREX.   FAMILY HISTORY:  Positive for MI and renal CA.   SOCIAL HISTORY:  Patient is widowed, lives alone in one level home with 10  steps at entry, was independent in driving  until two week prior to  admission.  Does not use any tobacco or alcohol.  Family has been providing  supervision past few days.   HOSPITAL COURSE:  Ms. Cass Edinger was admitted to rehab on September 15, 2005, for inpatient therapies to consist of PT, OT, speech therapy.  Past  admission, thigh-high TEDs were added to  help with DVT prophylaxis in  addition to PAS hose, in addition to SCDs throughout her stay.  Labs done  past admission revealed H&H of 12.7 and 37.8.  Check of lytes revealed  hypokalemia, potassium at 3.3.  As patient on Maxzide, she was started on  potassium supplement.  Recheck labs of September 20, 2005, revealed sodium  140, potassium 3.5, chloride 103, CO2 30, BUN 23, creatinine 1.3, glucose  100.  Patient's blood pressures monitored on b.i.d. basis, were noted to be  controlled ranging from 110 to 120 systolic, 60s to 16X diastolic.  Patient  was continent of bowel and bladder, no __________ required during this stay.  She was started on desensitization exercises  to help with her vestibular  symptoms.  During her stay in rehab, Ms. Shonk reported decrease in  dizziness and blurred vision.  She was instructed on exercises to help with  her symptomatology.  Patient was at supervision level for mobility, was able  to ambulate 250 feet with supervision with rolling walker.  She was modified  independent for upper body care, supervision for lower body care.  Speech  therapy evaluation showed basic high level expression and comprehension to  be intact.  Four word recall was intact.  Patient to continue with further  follow-up outpatient PT, OT at [facility] beginning September 26, 2005.  On September 21, 2005, patient discharged to home.   DISCHARGE MEDICATIONS:  1.  Atenolol 50 mg a day.  2.  Diovan 160 mg a day.  3.  Maxzide 37.5/25 per day.  4.  Clorazepate 7.5 mg b.i.d.  5.  Os-Cal Plus D t.i.d.  6.  K-Dur 20 mEq a day.  7.  Nasonex one squirt  each nostril daily.  8.  Zyrtec 10 mg a day.   DIET:  Regular.   ACTIVITY:  As tolerated with use of walker.  No driving or strenuous  activity for 4 to 6 weeks.  Supervision for now.   SPECIAL INSTRUCTIONS:  May use Tylenol as needed.  No alcohol, Coumadin or  aspirin.  Outpatient rehab beginning September 26, 2005, at 10:45.   FOLLOW UP:  Patient to follow up with Dr. Pearlean Brownie and Dr. Kriste Basque for routine  check in the next few weeks.  Follow-up with Dr. Ladona Ridgel also for recheck.  Follow-up with Dr. Riley Kill as needed.      Greg Cutter, P.A.      Ranelle Oyster, M.D.  Electronically Signed    PP/MEDQ  D:  12/04/2005  T:  12/06/2005  Job:  [id number]   cc:   Pramod P. Pearlean Brownie, MD  Fax: [phone]   Hewitt Shorts, M.D.  Fax: [phone]   Lonzo Cloud. Kriste Basque, M.D. [facility]  [address]  [place]  [place] [address]

## 2011-05-05 NOTE — Assessment & Plan Note (Signed)
[facility]                         ELECTROPHYSIOLOGY OFFICE NOTE   NAME:Dobek, AINHOA RALLO                     MRN:          [id number]  DATE:11/21/2006                            DOB:          30-Jun-1936    Ms. Kramar returns today for followup. She is a very pleasant 75 year old  woman with a history of obesity, bundle branch block, dyspnea on  exertion and pulmonary hypertension. She states today that she has begun  an exercise program several days a week for which she is exercising very  vigorously. She has lost weight and overall feels much improved. She  denies shortness of breath, she denies peripheral edema.   PHYSICAL EXAMINATION:  GENERAL:  She is a pleasant, well-appearing,  obese woman in no acute distress.  VITAL SIGNS:  The blood pressure was 120/88, the pulse 68 and regular,  respirations were 18, the weight was 208 pounds.  NECK:  Revealed no jugular venous distention.  LUNGS:  Clear bilaterally to auscultation.  CARDIOVASCULAR:  Revealed a regular rate and rhythm with normal S1 and a  split S2.  EXTREMITIES:  Demonstrated no cyanosis, clubbing or edema.   MEDICATIONS:  1. Diovan 160 daily.  2. Calcium.  3. Aspirin 81 mg daily.  4. Zyrtec.  5. Atenolol 50 mg daily.   IMPRESSION:  1. History of dyspnea thought secondary to mild to moderate pulmonary      hypertension.  2. Obesity.  3. History of a remote stroke with no residual.  4. Left bundle branch block.   DISCUSSION:  Overall, [name] [name] is stable. She has done very nicely in  the last year on medical therapy. I have asked her to continue her  exercise program and maintain a low sodium diet. We will plan to see her  back in 1 year.     Doylene Canning. Ladona Ridgel, MD  Electronically Signed    GWT/MedQ  DD: 11/21/2006  DT: 11/21/2006  Job #: [id number]

## 2011-05-05 NOTE — H&P (Signed)
[name], [name]              ACCOUNT NO.:  [id number]   MEDICAL RECORD NO.:  [id number]          PATIENT TYPE:  INP   LOCATION:  1826                         FACILITY:  [facility]   PHYSICIAN:  Marlan Palau, M.D.  DATE OF BIRTH:  17-Sep-1936   DATE OF ADMISSION:  09/13/2005  DATE OF DISCHARGE:                                HISTORY & PHYSICAL   HISTORY OF PRESENT ILLNESS:  [name] [name] is a 75 year old, right handed,  white female, born Aug 25, 1936, with a history of hypertension and  chronic Coumadin therapy for what appears to be a DVT problem in the past.  This patient has been seen and evaluated by Dr. Orlin Hilding in the past for  problems with intermittent episodes of vertigo and gait disturbance with  these episodes.  However, this patient noted sudden onset of left occipital  headache about ten days prior to this evaluation and admission and had  significant nausea/vomiting with onset of the headache, gait disturbance  that was significant.  The patient has persistent gait imbalance since that  time.  The patient came to the emergency room for an evaluation the day of  onset of the headache and was treated for nausea.  The patient did not  undergo an CT or an MRI scan of the head at that time.  The patient was  discharged to home and has had trouble with walking ever since, has had at  least one fall at home bumping her head.  The patient has been seen by Dr.  Kriste Basque and Dr. Orlin Hilding in the last day or two, set up for a MRI scan of the  brain which was done today showing evidence of a left cerebellar  intracranial hemorrhage with some mild brain stem compression.  The patient  clinically has actually improved since the onset of the headache and gait  disturbance ten days ago.  The patient denies double vision, denies any  focal numbness or weakness on the arms or legs, other than some tingling  sensation in the left hand associated with the use of Topamax.  The patient  notes that when she is walking she tends to stager either way, not just to  the left or just to the right.  The patient again denies double vision,  problems swallowing, or slurring of speech to any degree.  The patient is  being admitted at this point for observation and to reverse the Coumadin at  this point.  Hydrocephalus is not apparent on MRI scan.   PAST MEDICAL HISTORY:  1.  History of new onset of left cerebellar intracranial hemorrhage by MRI      evaluation, onset clinically ten days prior to admission.  2.  Chronic Coumadin therapy.  3.  Hypertension.  4.  Obesity.  5.  Coronary artery disease.  6.  History of deep vein thrombosis on Coumadin.  7.  Gallbladder resection.  8.  Appendectomy.  9.  Lumbosacral spine surgery x2 in the past.  10. History of left knee arthroscopic surgery.  11. History of pulmonary hypertension.   The patient states intolerance to  SULFA DRUGS can not take Imitrex.   CURRENT MEDICATIONS:  1.  Coumadin 5 mg a day except for 7.5 mg on Mondays and Fridays.  2.  Atenolol 50 mg a day.  3.  Diovan 60 mg a day.  4.  Tranxene 7.5 mg t.i.d.  5.  Dyazide 37.5/25 mg daily.  6.  Zyrtec 10 mg a day.  7.  Os-Cal 600 mg two daily.  8.  protegra  daily.  9.  Multivitamin.  10. Aspirin 81 mg a day.  11. Nasonex two sprays daily.  12. Topamax 30 mg q.h.s.   The patient does not smoke or drink.   SOCIAL HISTORY:  [place],  is widowed, has four children who are alive and well.   FAMILY MEDICAL HISTORY:  Mother is alive, age 90, good health.  Father died  age 71 with an MI.  The patient's one brother died age 85 with an MI.  The  patient has two sisters, one with renal carcinoma and one with obesity.   REVIEW OF SYSTEMS:  Notable for no recent fevers or chills.  The patient has  had a severe left occipital headache.  Denies neck stiffness, shortness of  breath, chest pain.  Has had nausea, vomiting.   Denies any problems  controlling the bowels or bladder.  Has not had any black out episodes,  focal symptoms.  The patient has had intermittent episodes of vertigo and  gait disturbance since December 2005.  Workup has been negative via Dr.  Orlin Hilding.   PHYSICAL EXAMINATION:  VITAL SIGNS:  Blood pressure is 142/95, heart rate  70, respiratory rate 20, temperature afebrile.  GENERAL:  This patient is a minimally to moderately obese white female who  is alert and cooperative at the time of examination.  HEENT:  Head is atraumatic.  Eyes:  Pupils equal, round, and reactive to  light.  Disks are flat bilaterally.  NECK:  Supple.  No carotid bruits noted.  RESPIRATORY:  Clear.  CARDIOVASCULAR:  Reveals a regular rate and rhythm.  No obvious murmurs rubs  noted.  ABDOMEN:  Reveals positive bowel sounds.  No organomegaly or tenderness  noted.  EXTREMITIES:  Without significant edema.  Ecchymosis noted over the left  forearm.  NEUROLOGIC:  Cranial nerve testing reveals good pinprick sensation to face  bilaterally.  Extraocular movements are full but end gaze nystagmus is seen  bilaterally.  Visual fields are full.  __________  .  Speech is well  enunciated, not aphasic.  Good strength to facial muscles and the muscles  for head turning, shoulder shrug were noted bilaterally.  The patient has  good strength on all four to direct testing.  The patient has fair finger-to-  nose-finer or heel-to-shin bilaterally.  Gait was not tested.  Deep tendon  reflexes remain symmetric normal. Toes neutral bilaterally.  No drift is  seen.  Rapid alternating movements in upper extremities is symmetric and  normal.   LABORATORY VALUES:  Pending at this time.  Pro time is pending.  MRI scan of  the brain is as above.  EKG has been done, shows normal sinus rhythm, left  axis deviation, heart rate of 78, left bundle branch block.   IMPRESSION: 1.  History of new onset of left cerebellar intracranial  hemorrhage.  2.  Chronic Coumadin therapy.  3.  Hypertension.  4.  History of deep vein thrombosis.   This patient has been on chronic Coumadin  therapy, possibly because of the  history of pulmonary hypertension and fears of recurring pulmonary emboli.  The patient has at any rate suffered a left cerebellar intracranial  hemorrhage on Coumadin and aspirin and both these medications will need to  be discontinued.  The patient will be admitted for observation.  By history,  the left cerebellar hemorrhage occurred ten days ago and if anything  clinically the patient has been improving.  There is some mild brain stem  compression.  Neurosurgery should be involved.   PLAN:  1.  Admission to [facility] for observation.  2.  Neurosurgery evaluation.  3.  Vitamin K, fresh frozen plasma.  4.  Physical and occupational therapy evaluation, rehab evaluation.   We will follow the patient's clinical course while in-house.  Thank you very  much.      Marlan Palau, M.D.  Electronically Signed     CKW/MEDQ  D:  09/13/2005  T:  09/13/2005  Job:  [id number]   cc:   Hewitt Shorts, M.D.  Fax: [phone]   Lonzo Cloud. Kriste Basque, M.D. [facility]  [address]  [place]  [place] [address]

## 2011-05-22 ENCOUNTER — Telehealth: Payer: Self-pay | Admitting: Pulmonary Disease

## 2011-05-22 NOTE — Telephone Encounter (Signed)
Called, spoke with pt.  States she is taking prednisone 2.5mg  qod.  She has 1 wk left but states SN had discussed possibly stopping this.  She has a pending appt with SN for June 30 but does not want to order more if he is going to stop this.  She denies any problems.  Would like to know if she should stop the prednisone after current supply runs out or should she reorder this.  Pls advise.  Thanks!

## 2011-05-22 NOTE — Telephone Encounter (Signed)
Per SN---ok for pt to stop the prednisone once she finishes this rx.  Pt stated that she has appt on June 30 with SN and will just keep this appt.

## 2011-05-22 NOTE — Telephone Encounter (Signed)
[email or web address]. [name] [name], CMA  

## 2011-05-22 NOTE — Telephone Encounter (Signed)
Patient phoned stated she was returning a call to triage she can be reached at [phone].Roswell Nickel

## 2011-05-25 ENCOUNTER — Other Ambulatory Visit: Payer: Self-pay | Admitting: Hematology & Oncology

## 2011-05-25 ENCOUNTER — Encounter (HOSPITAL_BASED_OUTPATIENT_CLINIC_OR_DEPARTMENT_OTHER): Payer: Medicare Other | Admitting: Hematology & Oncology

## 2011-05-25 DIAGNOSIS — D649 Anemia, unspecified: Secondary | ICD-10-CM

## 2011-05-25 DIAGNOSIS — Z7901 Long term (current) use of anticoagulants: Secondary | ICD-10-CM

## 2011-05-25 DIAGNOSIS — Z86718 Personal history of other venous thrombosis and embolism: Secondary | ICD-10-CM

## 2011-05-25 DIAGNOSIS — I82409 Acute embolism and thrombosis of unspecified deep veins of unspecified lower extremity: Secondary | ICD-10-CM

## 2011-05-25 DIAGNOSIS — I509 Heart failure, unspecified: Secondary | ICD-10-CM

## 2011-05-25 LAB — CBC WITH DIFFERENTIAL (CANCER CENTER ONLY)
BASO#: 0 10*3/uL (ref 0.0–0.2)
Eosinophils Absolute: 0.2 10*3/uL (ref 0.0–0.5)
HCT: 38.4 % (ref 34.8–46.6)
HGB: 12.6 g/dL (ref 11.6–15.9)
LYMPH%: 16.1 % (ref 14.0–48.0)
MCH: 29 pg (ref 26.0–34.0)
MCV: 89 fL (ref 81–101)
MONO#: 0.8 10*3/uL (ref 0.1–0.9)
MONO%: 12.6 % (ref 0.0–13.0)
NEUT%: 68.4 % (ref 39.6–80.0)
Platelets: 194 10*3/uL (ref 145–400)
RBC: 4.34 10*6/uL (ref 3.70–5.32)

## 2011-07-02 ENCOUNTER — Other Ambulatory Visit: Payer: Self-pay | Admitting: Pulmonary Disease

## 2011-07-13 ENCOUNTER — Encounter: Payer: Self-pay | Admitting: Pulmonary Disease

## 2011-07-18 ENCOUNTER — Encounter: Payer: Self-pay | Admitting: Pulmonary Disease

## 2011-07-18 ENCOUNTER — Ambulatory Visit (INDEPENDENT_AMBULATORY_CARE_PROVIDER_SITE_OTHER)
Admission: RE | Admit: 2011-07-18 | Discharge: 2011-07-18 | Disposition: A | Discharge status: Home / Self Care | Payer: Medicare Other | Source: Ambulatory Visit | Attending: Pulmonary Disease | Admitting: Pulmonary Disease

## 2011-07-18 ENCOUNTER — Other Ambulatory Visit (INDEPENDENT_AMBULATORY_CARE_PROVIDER_SITE_OTHER): Payer: Medicare Other | Admitting: Pulmonary Disease

## 2011-07-18 ENCOUNTER — Other Ambulatory Visit: Payer: Medicare Other

## 2011-07-18 ENCOUNTER — Ambulatory Visit (INDEPENDENT_AMBULATORY_CARE_PROVIDER_SITE_OTHER): Payer: Medicare Other | Admitting: Pulmonary Disease

## 2011-07-18 DIAGNOSIS — I10 Essential (primary) hypertension: Secondary | ICD-10-CM

## 2011-07-18 DIAGNOSIS — F411 Generalized anxiety disorder: Secondary | ICD-10-CM

## 2011-07-18 DIAGNOSIS — I872 Venous insufficiency (chronic) (peripheral): Secondary | ICD-10-CM

## 2011-07-18 DIAGNOSIS — D869 Sarcoidosis, unspecified: Secondary | ICD-10-CM

## 2011-07-18 DIAGNOSIS — I82409 Acute embolism and thrombosis of unspecified deep veins of unspecified lower extremity: Secondary | ICD-10-CM

## 2011-07-18 DIAGNOSIS — Z950 Presence of cardiac pacemaker: Secondary | ICD-10-CM

## 2011-07-18 DIAGNOSIS — E669 Obesity, unspecified: Secondary | ICD-10-CM

## 2011-07-18 DIAGNOSIS — E785 Hyperlipidemia, unspecified: Secondary | ICD-10-CM

## 2011-07-18 DIAGNOSIS — I2510 Atherosclerotic heart disease of native coronary artery without angina pectoris: Secondary | ICD-10-CM

## 2011-07-18 DIAGNOSIS — K279 Peptic ulcer, site unspecified, unspecified as acute or chronic, without hemorrhage or perforation: Secondary | ICD-10-CM

## 2011-07-18 DIAGNOSIS — Z0000 Encounter for general adult medical examination without abnormal findings: Secondary | ICD-10-CM

## 2011-07-18 DIAGNOSIS — R300 Dysuria: Secondary | ICD-10-CM

## 2011-07-18 DIAGNOSIS — I2789 Other specified pulmonary heart diseases: Secondary | ICD-10-CM

## 2011-07-18 DIAGNOSIS — I2699 Other pulmonary embolism without acute cor pulmonale: Secondary | ICD-10-CM

## 2011-07-18 DIAGNOSIS — M1990 Unspecified osteoarthritis, unspecified site: Secondary | ICD-10-CM

## 2011-07-18 DIAGNOSIS — I6350 Cerebral infarction due to unspecified occlusion or stenosis of unspecified cerebral artery: Secondary | ICD-10-CM

## 2011-07-18 LAB — URINALYSIS, ROUTINE W REFLEX MICROSCOPIC
Hgb urine dipstick: NEGATIVE
Nitrite: NEGATIVE
Specific Gravity, Urine: 1.015 (ref 1.000–1.030)
Total Protein, Urine: NEGATIVE
Urine Glucose: NEGATIVE
Urobilinogen, UA: 0.2 (ref 0.0–1.0)

## 2011-07-18 NOTE — Progress Notes (Signed)
Subjective:    Patient ID: [name] [name], female    DOB: 20-Nov-1936, 75 y.o.   MRN: [id number]  HPI 75 y/o WF followed for mult medical problems...  ~  August 17, 2010:  in early Aug she developed sudden SOB- she thought it was her heart & saw DrTaylor but pacer was OK... went to ER w/ eval revealing bilat PE by V/Q (couldn't do CTA due to Creat=4.2), also had DDimer>20, BNP=1400, Troponin= 0.35, note- ven dopplers were neg for DVT... hx cerebellar hemorrhage (while on Coumadin in past) & Neuro consult said absolute contraindication for Coumadin Rx, therefore had IVC filter placed & transient IV heparin... Fluid management resulted in improved renal function & BNP (Creat=1.14 at end of hosp, & BNP=400)... resp management w/ NEBS etc... she was tx to rehab & disch home 08/06/10> since then c/o weak- getting home PT, and nocturnal O2 qualified...   ~  September 14, 2010:  since I saw her last she has been adm x2 w/ DVT- hosp records reviewed in detail: Adm 9/12-15 & disch on Lovenox once daily but had recurrent leg swelling & pain w/ VenDopplers showing a new DVT & Adm 9/21-25 where Lovenox was incr to Bid... now taking Lovenox 80mg Bid- hypercoag w/u showed sl incr homocystine & DrEnnever rec Folic 1mg /d.... she has severe periph edema & wt up 9# in 37mo> we will incr Lasix to 40mg Qam... she is feeling much better, more mobile, & has a great attitude.  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 much improved now & he plans consult to [facility]... DCSummary reviewed & meds reconcilled> we discussed ROV 2-3  mo w/ CXR & fasting labs.  ~  January 17, 2011:  She is followed by DrEnnever for Heme & he sent her to [facility] (seen 11/11 by DrMoll)> notes reviewed: recurrent unprovoked thromboembolic dis w/ similar hx  in daughter, hx intracranial hemorrhage on warfarin, IVC filter placed & this is unremovable, mod elev beta-2 glycoprot antibody> he rec lifelong Arixtra...  she tells me that recent check by DrEnnever w/ VenDopplers showing ?new clot & she is concerned- still on Arixtra- & he is following very closely.    She remains on Pred 5mg - 1/2 Qod for her hx Sarcoid & CXR stable... recent labs reviewed and everything looks OK.    She is c/o URI w/ hoarseness & sl dry cough, no f/c/s, no sput, no CP/ palpit, etc... we discussed Rx w/ ZPak, Mucinex, MMW.  ~  July 18, 2011:  65mo ROV & she finished/ stopped the Pred last month> breathing is good & she denies cough, sputum, CP, SOB, edema, etc... CXR today for f/u Sarcoid showed normal heart size, pacer, scarring at bases, no adenopathy or acute changes;  She requests UA & cult for urinary symptoms (pos for sensitive EColi, treated w/ CIPRO) and Rx for shingles vaccine...    She had f/u DrTaylor 2/12>  Hx LBBB, then CHB==> pacer; stable, no changes made...    She continues to f/u w/ DrEnnever every 3months & he does labs, she remains on Arixtra injections (75mg /d) & refuses Pradaxa pills; also had f/u [facility] 3/12 & his note is reviewed...        Problem List:  ALLERGIC RHINITIS (ICD-477.9) - she uses NASONEX & ZYRTEK  per DrKozlow...  OBSTRUCTIVE SLEEP APNEA (ICD-327.23) - she is followed by DrClance... She uses oxygen 2-3 liters/min flow at night. ~  sleep study 2004 showed RDI= 16, desat to 78%, PLMS= 248 w/o sleep disruption... pt opted for diet, weight reduction... ~  repeat sleep study 8/10 showed mild OSA w/ AHI=15 & desat to 83%... she refused CPAP & opted for the dental appliance- went to [facility] DrEssick w/ mandibular advancement appliance made & she is very happy. ~  f/u DrClance 7/11 w/ persistant daytime hypersomnolence- he considered repeat sleep study, she declined. ~  We discussed the need for a repeat overnight oximetry test, but she wants to  hold-off for now & continue as she is...  SARCOIDOSIS (ICD-135) - Eval for dyspnea revealed evid for pulmonary hypertension and CTChest 4/08 showed +lymphadenopathy... she had a supraclavicular node biopsy 5/08 by DrStreck that revealed extensive granulomatous inflammation... ACE level= 65, sed= 35, ANA= neg, CBC/ chems= normal... PREDNISONE therapy started  & slowly weaned to 5mg - 1/2 tab Qod ==> now off since 6/12... ~  PFT's 12/08 showed FVC=2.62 84%), FEV1=1.74 (71%), %1sec=67, mid-flows=33%pred. ~  CXR 9/09 showed stable chr interstitial markings, no adenopathy, scarring at the bases, pacer, NAD. ~  labs 9/09 showed ACE= 47... rec decr Pred5  to 1/2 tab Qod. ~  CT Chest 4/10 showed calcif hilar & mediast nodes- old gran dis, bilat scarring is stable (no active lung dis), asymmetric osteoarth in sternoclav joint... ~  CXR 1/11 showed stable scarring & pacer, NAD. ~  CXR 8/11 showed similar w/ borderline heart size, pacer, prom PA, & scarring. ~  CXR 1/12 showed stable mild cardiomeg, basilar scarring, pacer, NAD.[name] Kitchen. ~  CXR 7/12 showed borderline heart size, pacer, scarring at lung bases, NAD...  PULMONARY HYPERTENSION (ICD-416.8) -  Eval by Cardiology & DrClance 2004- he noted low PVR readings, eventually had sleep study done- see above;  TEE 4/05 showed norm LVF w/ EF=55-65%, no regional wall motion abn, mild MR, mod TR & incr peak pulm sys pressure & norm RV;  etiology believed to be poss combination of sarcoid, obesity, sleep apnea, hx of DVT...  Hx of ASTHMATIC BRONCHITIS, ACUTE (ICD-466.0) - she was dx w/ reflux related airway dis by DrKozlow in 2009...  On DUONEB Bid &  FLOVENT 44- 2spBID.[name] KitchenMarland Kitchen  PULMONARY EMBOLISM (ICD-415.19) 8/11 and subsequent DEEP VENOUS THROMBOPHLEBITIS, LEG, RIGHT (ICD-453.40) << SEE ABOVE >> now on ARIXTRA 7.5mg  Sq daily & followed by DrEnnever...  prev on Coumadin & off since cerebellar bleed in 2006... NOTE: she had neg venous dopplers 8/11 adm for PE. VENOUS  INSUFFICIENCY (ICD-459.81) - she has VV/ VI and follows a low sodium diet, elevation, support hose, etc... On LASIX 20mg - 2Qam...  HYPERTENSION (ICD-401.9) - controlled on METOPROLOL 25mg /d, & the LASIX... BP= 134/82 today- denies HA, visual changes, CP, palipit, dizziness, syncope, etc...   CORONARY ARTERY DISEASE (ICD-414.00) - adenosine cardiolite 8/04 showed distal ant & apical infarct w/ mild ischemia & EF=33%;  cardiac cath 9/04 showed min non-obstructive CAD & LVD w/ EF=46%, global HK, 2+MR, PA press 50/24 w/ wedge 20;  TEE 4/05 - see above... Hosp 08/03/23 w/ CP/ SOB/ fatigue- cath showed norm Lmain, 30-40% LAD, 30% ostial CIRC, norm RCA, ant wall HK w/ EF=40%... she had LBBB, high grade AV block, pacer inserted...  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 Hosp 2023/08/03 w/ high grade block- pacer  placed and pt feeling better... ~  last seen in office 9/10 by DrTaylor> gets remote device checks> ?Cards eval during flurry of hospitalizations 8-9/11?  HYPERLIPIDEMIA (ICD-272.4) - prev on diet alone, intol to Lipitor w/ musc aching, now on PRAVASTATIN 40mg /d... ~  FLP 8/07 on diet showed TChol 190, TG 131, HDL 31, LDL 133... did not want med Rx. ~  FLP 8/09 in [place] showed TChol 182, TG 143, HDL 28, LDL 125... Lipitor Rx started. ~  FLP 12/09 on Lip10 showed TChol 154, TG 117, HDL 37, LDL 93... ch to Prav40 due to musc aching. ~  FLP 7/10 on Prav40 showed TChol 167, Tg 140, HDL 39, LDL 100 ~  FLP 9/11 showed TChol 143, TG 100, HDL 43, LDL 80  OBESITY (ICD-278.00) - peak weight of 226# in 2009... discussed diet + exercise. ~  weight 1/10 = 215# ~  weight 7/10 = 216# ~  weight 1/11 = 215#... we reviewed diet + exercise program. ~  weight 8/11 = 200# ~  weight 9/11 = 209# .[name] Kitchen. she has incr edema after he back to back hosp adms. ~  weight 1/12 = 192#  Hx of PUD (ICD-533.90) - on OMEPRAZOLE 40mg  Bid...    HEMORRHOIDS (ICD-455.6) - last colonoscopy 11/01 by DrStark was neg x hems; f/u 10 yrs.  RENAL INSUFFICIENCY (ICD-588.9), & UTI (ICD-599.0) - see 8/11 hosp w/ Creat= 4.2 on adm... improved to 1.14 by disch w/ fluid management. ~  labs 8/11 post disch showed BUN= 15, Creat= 1.1 (off diuretics etc). ~  labs 9/11 in hosp showed BUN= 11, Creat= 1.1 (back on diuretics for edema). ~  Labs 4/12 in [facility] showed BUN= 24, Creat= 1.23  DEGENERATIVE JOINT DISEASE (ICD-715.90) - c/o neck & shoulder pain- saw DrRowan w/ XRays of neck= "6 spurs" per pt & he rec PT which she says didn't help... therefore went to chiropractor w/ some benefit but short lived betw appts... can't do MRI due to pacer... noticed knot at clavicular head near sternum- tender to palp, no known trauma... XRay & CTscan showed severe degen dis... Rx ETODOLAC.  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 ~  Neurology reiterated her absolute contraindication to coumadin Rx during 8/11 hosp w/ PE/ DVT...  R/O OTHER CONVULSIONS (ICD-780.39) - eval by DrWeyman & refer to [facility] for unusual episodes of "fear & gait instability w/ fall">> neg ambulatory EEG from DrDeToledo in the Epilepsy Monitoring Unit... 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 she notes occas headaches behind her left ear and in the right temporal area... Tylenol usually helps these pains...  ANXIETY (ICD-300.00) - on Clorazepate 7.5mg  Tid...   Past Surgical History  Procedure Date  . Appendectomy   . Cholecystectomy 01/1996    Dr. Woodfin Ganja  . Abdominal hysterectomy   . Lumbar disc surgery 1990  . Left knee arthroscopy   . Ivc filter placed 07/2010   SHE DID NOT BRING MED BOTTLES TO REVIEW: Outpatient Encounter Prescriptions as of 07/18/2011  Medication Sig  Dispense Refill  . amoxicillin-clavulanate (AUGMENTIN) 875-125 MG per tablet Take 1 tablet by mouth 2 (two) times daily.        . cetirizine (ZYRTEC) 10 MG tablet Take 10 mg by mouth daily.        . Cholecalciferol (VITAMIN D)  1000 UNITS capsule Take 1,000 Units by mouth daily.        . clorazepate (TRANXENE) 7.5 MG tablet Take 7.5 mg by mouth 3 (three) times daily. As needed for nerves        . Diphenhyd-Hydrocort-Nystatin (FIRST-DUKES MOUTHWASH) SUSP 1 tsp gargle and swallow up to 4 times daily as needed       . FLOVENT HFA 44 MCG/ACT inhaler INHALE 2 PUFFS 2 TIMES A DAY  1 Inhaler  3  . folic acid (FOLVITE) 0.5 MG tablet Take 1 mg by mouth daily.        . fondaparinux (ARIXTRA) 7.5 MG/0.6ML SOLN 1 injection daily        . furosemide (LASIX) 20 MG tablet Take 40 mg by mouth daily.        [name] Kitchen HYDROcodone-homatropine (HYCODAN) 5-1.5 MG/5ML syrup 1-2 tsp every 4-6 hours as needed for cough       . ipratropium-albuterol (DUONEB) 0.5-2.5 (3) MG/3ML SOLN Take 3 mLs by nebulization 2 (two) times daily.        . metoprolol succinate (TOPROL-XL) 25 MG 24 hr tablet TAKE 1 TABLET EVERY DAY  30 tablet  6  . mometasone (NASONEX) 50 MCG/ACT nasal spray Place 2 sprays into the nose daily.        . Multiple Vitamins-Minerals (CENTRUM SILVER PO) Take 1 tablet by mouth daily.        . Multiple Vitamins-Minerals (PROTEGRA CARDIO PO) Take 1 tablet by mouth daily.        [name] Kitchen omeprazole (PRILOSEC) 40 MG capsule Take 40 mg by mouth 2 (two) times daily.        [name] Kitchen oxycodone (OXY-IR) 5 MG capsule Take 5 mg by mouth every 6 (six) hours as needed.        Bertram Gala Glycol-Propyl Glycol (SYSTANE) 0.4-0.3 % SOLN Apply 2 drops to eye daily.        . pravastatin (PRAVACHOL) 40 MG tablet Take 40 mg by mouth daily.        . predniSONE (DELTASONE) 5 MG tablet Take 1/2 tablet by mouth every other day as directed       . sennosides-docusate sodium (SENOKOT-S) 8.6-50 MG tablet Take 2 tablets by mouth at bedtime.           Allergies  Allergen Reactions  . Sulfonamide Derivatives     REACTION: nausea  . Sumatriptan     Current Medications, Allergies, Past Medical History, Past Surgical History, Family History, and Social History were reviewed in [employer] record.    Review of Systems       See HPI - all other systems neg except as noted...  The patient complains of dyspnea on exertion and peripheral edema.  The patient denies anorexia, fever, weight loss, weight gain, vision loss, decreased hearing, hoarseness, chest pain, syncope, prolonged cough, headaches, hemoptysis, abdominal pain, melena, hematochezia, severe indigestion/heartburn, hematuria, incontinence, muscle weakness, suspicious skin lesions, transient blindness, difficulty walking, depression, unusual weight change, abnormal bleeding, enlarged lymph nodes, and angioedema.   Objective:   Physical Exam     WD, Overweight, 75 y/o WF in NAD... she states that she is feeling better. GENERAL:  Alert & oriented; pleasant & cooperative... HEENT:  [place]/AT, EOM-wnl, PERRLA, EACs-clear, TMs-wnl, NOSE- dry MM, THROAT-clear & wnl. NECK:  Supple w/ fair ROM; no JVD; normal carotid impulses w/o bruits; no thyromegaly or nodules palpated; no lymphadenopathy palpated -  scar of prev supraclav LN bx... prominent right clavicular head... CHEST:  Clear to P & A; without wheezes/ rales/ or rhonchi heard... HEART:  Regular rhythm, no murmurs/ rubs/ gallops detected... ABDOMEN:  Obese, soft & nontender; normal bowel sounds; no organomegaly or masses palpated... EXT: without deformities, mild arthritic changes; + ven insuffic and 1+edema- much improved... NEURO:  CN's intact; no focal neuro deficits... DERM:  No lesions noted; no rash etc...   Assessment & Plan:   SARCOID & AB>  She is now off the Pred & stable from the resp standpoint;  CXR w/o acute changes & appears stable as well;  We discussed f/u labs w/ ACE but she wants to  wait... Continue Duoneb & Flovent...  DVT/ PE>  On Arixtra injections daily per DrEnnever; also followed at [facility] & his notes are reviewed...  HBP>  Controlled on  Metoprolol & Lasix;  Continue same...  CAD, hx syncope/ pacer>  Followed by DrTaylor for Cards;  Continue same meds...  HYPERLIPIDEMIA>  On PRAV40 + diet etc... Continue same & needs f/u FLP soon...  RENAL Insuffic>  Creat is stable at 1.2; she has UTI treated w/ Cipro...  DJD>  Followed by [facility] & she knows to avoid NSAIDs etc...  Hx Stroke>  Hx cerebellar bleed 2006 while on coumadin which is now contraindicated...  Anxiety>  On Chlorazepate prn.[name] KitchenMarland Kitchen

## 2011-07-18 NOTE — Patient Instructions (Signed)
Today we updated your med list in EPIC    Continue your current meds the same...  Today we did your follow up CXR & Urinalysis...    Please call the PHONE TREE in a few days for your results...    Dial [id number] & when prompted enter your patient number followed by the # symbol...    Your patient number is:  [id number]#  Keep up the good work w/ diet & exercise...  Call for any questions...  Let's plan a follow up visit in 6 months, or sooner if needed for problems.[name] KitchenMarland Kitchen

## 2011-07-19 ENCOUNTER — Telehealth: Payer: Self-pay | Admitting: Pulmonary Disease

## 2011-07-19 NOTE — Telephone Encounter (Signed)
ATC NA and no option to leave a msg, WCB.  

## 2011-07-20 LAB — URINE CULTURE: Colony Count: >=100000

## 2011-07-20 NOTE — Telephone Encounter (Signed)
Called spoke with patient who states that she discussed the shingles vaccine with her pharmacy, who told her that they can administer the vaccine but by a pharmacist.  Pt called her insurance to see if they will pay and was informed that they will pay as long as it's given by a physician.  Pt wants to come to the office for this administration > was told by ins that if a date can be chosen, she may call them and they will have the vaccine shipped to the office for the administration.    SN please advise, thanks!

## 2011-07-20 NOTE — Telephone Encounter (Signed)
Pt states she spoke to the nurse.Carron Curie, CMA

## 2011-07-21 ENCOUNTER — Telehealth: Payer: Self-pay | Admitting: Pulmonary Disease

## 2011-07-21 NOTE — Telephone Encounter (Signed)
Spoke with TD---she stated that the pt can have the vaccine shipped to herself and just let us know when she is ready for the vaccine. She stated that she will check on this and give [place] a call back if they are not able to do this.  She stated that her insurance will only cover the vaccine if it is given in the doctors office. Pt will call back to let us know what she is going to do.

## 2011-07-21 NOTE — Telephone Encounter (Signed)
Called spoke with patient who states that the representative she spoke with at her ins company informed her that they need to know the actual name of the vaccine to "see if they have it."  Pt states that she was not told whether or not the vaccine can be shipped to her home.  Pt states that the rx she was given reads "shingles vaccine adult dose" and this needs to be more specific.  I did mention to patient that she can take her rx to the health dept and that a physician will administer the vaccine there - this is what our other patients do without any issues.  Pt states that she knows "some people in the health dept" and will call them Monday to follow up on this.  Per pt's request, will check on the "actual name of the vaccine" and call her Monday.  Per epic, "shingles vaccine" search results in varicella-zoster subcutaneous vaccine.  Please advise if this is okay.  Pt okay with a call-back on Monday.

## 2011-07-21 NOTE — Telephone Encounter (Signed)
Per SN----ok to give in the allergy clinic.   Or she can go to the health dept or the pharmacy for this vaccine.

## 2011-07-23 ENCOUNTER — Encounter: Payer: Self-pay | Admitting: Pulmonary Disease

## 2011-07-27 NOTE — Telephone Encounter (Signed)
LMOMTCB

## 2011-07-27 NOTE — Telephone Encounter (Signed)
Yes the vaccine that she needs is the varicella zoster vaccine.  thanks

## 2011-07-27 NOTE — Telephone Encounter (Signed)
Pls advise if this has been taken care of yet  Thanks!

## 2011-07-28 MED ORDER — CIPROFLOXACIN HCL 250 MG PO TABS: 250.0000 mg | ORAL_TABLET | Freq: Two times a day (BID) | ORAL | Status: AC

## 2011-07-28 NOTE — Telephone Encounter (Signed)
Returning call.  [phone]

## 2011-07-28 NOTE — Telephone Encounter (Signed)
Advised the pt of the name for shingles vaccine. She also stated that she listened to phone tree message and it states she has a uti and needs cipro. I spoke to [facility] and per SN give pt Cipro 250mg  1 po bid #14. Rx sent. Pt aware. Carron Curie, CMA

## 2011-08-02 ENCOUNTER — Other Ambulatory Visit: Payer: Self-pay | Admitting: Hematology & Oncology

## 2011-08-02 ENCOUNTER — Encounter: Payer: Self-pay | Admitting: Internal Medicine

## 2011-08-02 ENCOUNTER — Telehealth: Payer: Self-pay | Admitting: Pulmonary Disease

## 2011-08-02 ENCOUNTER — Encounter (HOSPITAL_BASED_OUTPATIENT_CLINIC_OR_DEPARTMENT_OTHER): Payer: Medicare Other | Admitting: Hematology & Oncology

## 2011-08-02 DIAGNOSIS — I82409 Acute embolism and thrombosis of unspecified deep veins of unspecified lower extremity: Secondary | ICD-10-CM

## 2011-08-02 DIAGNOSIS — Z7901 Long term (current) use of anticoagulants: Secondary | ICD-10-CM

## 2011-08-02 LAB — CBC WITH DIFFERENTIAL (CANCER CENTER ONLY)
BASO#: 0 10*3/uL (ref 0.0–0.2)
BASO%: 0.3 % (ref 0.0–2.0)
EOS%: 1.4 % (ref 0.0–7.0)
Eosinophils Absolute: 0.1 10*3/uL (ref 0.0–0.5)
HCT: 38.8 % (ref 34.8–46.6)
HGB: 13.1 g/dL (ref 11.6–15.9)
LYMPH#: 1.3 10*3/uL (ref 0.9–3.3)
LYMPH%: 19.7 % (ref 14.0–48.0)
MCH: 30 pg (ref 26.0–34.0)
MCHC: 33.8 g/dL (ref 32.0–36.0)
MCV: 89 fL (ref 81–101)
MONO#: 1 10*3/uL — ABNORMAL HIGH (ref 0.1–0.9)
MONO%: 14.6 % — ABNORMAL HIGH (ref 0.0–13.0)
NEUT#: 4.3 10*3/uL (ref 1.5–6.5)
NEUT%: 64 % (ref 39.6–80.0)
Platelets: 194 10*3/uL (ref 145–400)
RBC: 4.36 10*6/uL (ref 3.70–5.32)
RDW: 13.8 % (ref 11.1–15.7)
WBC: 6.7 10*3/uL (ref 3.9–10.0)

## 2011-08-02 NOTE — Telephone Encounter (Signed)
Pt aware rx for Cipro was sent to [facility] on [address] on 07/28/2011. She said she had not called the pharmacy and would contact them to make sure it was ready.

## 2011-08-03 LAB — D-DIMER, QUANTITATIVE: D-Dimer, Quant: 1.39 ug/mL-FEU — ABNORMAL HIGH (ref 0.00–0.48)

## 2011-08-07 ENCOUNTER — Other Ambulatory Visit: Payer: Self-pay | Admitting: Pulmonary Disease

## 2011-08-08 ENCOUNTER — Telehealth: Payer: Self-pay | Admitting: Pulmonary Disease

## 2011-08-08 NOTE — Telephone Encounter (Signed)
Pt returned call.  Please call at [phone].  [name] [name]

## 2011-08-08 NOTE — Telephone Encounter (Signed)
Per Tammy D., if pt has the medication or can get it or pick it up from the pharmacy, we can give the injection here at the office. LMOMTCB x 1.

## 2011-08-09 NOTE — Telephone Encounter (Signed)
Pt returning call.[name] [name] ° °

## 2011-08-09 NOTE — Telephone Encounter (Signed)
Pt aware. Saia Derossett, CMA  

## 2011-08-09 NOTE — Telephone Encounter (Signed)
lmomtcb X1 

## 2011-08-23 ENCOUNTER — Other Ambulatory Visit (INDEPENDENT_AMBULATORY_CARE_PROVIDER_SITE_OTHER): Payer: Medicare Other

## 2011-08-23 ENCOUNTER — Telehealth: Payer: Self-pay | Admitting: Pulmonary Disease

## 2011-08-23 DIAGNOSIS — N390 Urinary tract infection, site not specified: Secondary | ICD-10-CM

## 2011-08-23 LAB — URINALYSIS, ROUTINE W REFLEX MICROSCOPIC
Bilirubin Urine: NEGATIVE
Ketones, ur: NEGATIVE
Urobilinogen, UA: 0.2 (ref 0.0–1.0)

## 2011-08-23 NOTE — Telephone Encounter (Signed)
Orders placed under TP's name.  Will also forward message back to her as FYI on clarification on the finish date of the cipro.

## 2011-08-23 NOTE — Telephone Encounter (Signed)
Pt advised of need to leave sample. Medication was called in on 07-28-11 and pt picked up meds on that day as well per pharmacy. I have [id number] to clarify stop date with the pt and advise of need to leave sample. Carron Curie, CMA

## 2011-08-23 NOTE — Telephone Encounter (Signed)
Needs to leave another sample of UA -send for urine with micro and urine cx  And we will call with results.  The first UTI was 7/31, why is she just now finshing the abx.?  Please contact office for sooner follow up if symptoms do not improve or worsen or seek emergency care  Would be glad to see in office in am if she would like.

## 2011-08-23 NOTE — Telephone Encounter (Signed)
Pt returned call.  She is aware to come and leave another sample of urine and we will call with the results.  Pt would rather do this then come in for OV at this time.  States she did pick up the abx on 07/28/11 and finished it around 08/04/11, not 08/17/11.  She is aware to call back if symptoms worsen or seek emergency care.  She verbalized understanding of this plan.

## 2011-08-23 NOTE — Telephone Encounter (Signed)
Spoke with the pt and she states she finished course of cipro 250 twice daily x 7 days on 08-17-11. She states while she was on the abx she felt better but then 2-3 days after stopping the abx she began to have burning with urination again and feeling the urge to urinate often, but then not much comes out. Please advise does pt need to leave another sample? Carron Curie, CMA Allergies  Allergen Reactions  . Sulfonamide Derivatives     REACTION: nausea  . Sumatriptan

## 2011-08-25 ENCOUNTER — Ambulatory Visit (INDEPENDENT_AMBULATORY_CARE_PROVIDER_SITE_OTHER): Payer: Medicare Other | Admitting: Internal Medicine

## 2011-08-25 ENCOUNTER — Encounter: Payer: Self-pay | Admitting: Internal Medicine

## 2011-08-25 DIAGNOSIS — I10 Essential (primary) hypertension: Secondary | ICD-10-CM

## 2011-08-25 DIAGNOSIS — I442 Atrioventricular block, complete: Secondary | ICD-10-CM

## 2011-08-25 DIAGNOSIS — Z950 Presence of cardiac pacemaker: Secondary | ICD-10-CM

## 2011-08-25 LAB — PACEMAKER DEVICE OBSERVATION
AL THRESHOLD: 0.5 V
ATRIAL PACING PM: 12
BATTERY VOLTAGE: 2.79 V
VENTRICULAR PACING PM: 100

## 2011-08-25 NOTE — Progress Notes (Signed)
HPI [name] returns today for followup. She has a h/o symptomatic bradycardia s/p PPM, HTN, prior hemorhagic stroke, recurrent Pulmonary emboli, who returns for followup. The patient has done amazingly well. She denies c/p, sob, or peripheral edema. She is working out at [employer]. No syncope.  Allergies  Allergen Reactions  . Sulfonamide Derivatives     REACTION: nausea  . Sumatriptan      Current Outpatient Prescriptions  Medication Sig Dispense Refill  . cetirizine (ZYRTEC) 10 MG tablet Take 10 mg by mouth daily.        . Cholecalciferol (VITAMIN D) 1000 UNITS capsule Take 1,000 Units by mouth daily.        . clorazepate (TRANXENE) 7.5 MG tablet Take 7.5 mg by mouth 3 (three) times daily. As needed for nerves        . Diphenhyd-Hydrocort-Nystatin (FIRST-DUKES MOUTHWASH) SUSP 1 tsp gargle and swallow up to 4 times daily as needed      . FLOVENT HFA 44 MCG/ACT inhaler INHALE 2 PUFFS 2 TIMES A DAY  10.6 g  5  . folic acid (FOLVITE) 0.5 MG tablet Take 1 mg by mouth daily.        . fondaparinux (ARIXTRA) 7.5 MG/0.6ML SOLN 1 injection daily        . furosemide (LASIX) 20 MG tablet Take 40 mg by mouth daily.        [name] Kitchen HYDROcodone-homatropine (HYCODAN) 5-1.5 MG/5ML syrup 1-2 tsp every 4-6 hours as needed for cough       . ipratropium-albuterol (DUONEB) 0.5-2.5 (3) MG/3ML SOLN Take 3 mLs by nebulization 2 (two) times daily.        . metoprolol succinate (TOPROL-XL) 25 MG 24 hr tablet TAKE 1 TABLET EVERY DAY  30 tablet  6  . mometasone (NASONEX) 50 MCG/ACT nasal spray Place 2 sprays into the nose daily.        . Multiple Vitamins-Minerals (CENTRUM SILVER PO) Take 1 tablet by mouth daily.        [name] Kitchen omeprazole (PRILOSEC) 40 MG capsule Take 40 mg by mouth 2 (two) times daily.        [name] Kitchen oxycodone (OXY-IR) 5 MG capsule Take 5 mg by mouth every 6 (six) hours as needed.        Bertram Gala Glycol-Propyl Glycol (SYSTANE) 0.4-0.3 % SOLN Apply 2 drops to eye daily.        . pravastatin (PRAVACHOL) 40 MG  tablet Take 40 mg by mouth daily.        . sennosides-docusate sodium (SENOKOT-S) 8.6-50 MG tablet Take 2 tablets by mouth at bedtime.           Past Medical History  Diagnosis Date  . Allergic rhinitis   . OSA (obstructive sleep apnea)   . Sarcoidosis   . Pulmonary hypertension   . Asthmatic bronchitis   . Pulmonary embolism   . Right leg DVT   . Hypertension   . CAD (coronary artery disease)   . Syncope   . Cardiac pacemaker in situ   . Venous insufficiency   . Hyperlipidemia   . Obesity   . PUD (peptic ulcer disease)   . Hemorrhoids   . Renal insufficiency   . UTI (lower urinary tract infection)   . DJD (degenerative joint disease)   . Cervicalgia   . Lumbar back pain   . Stroke   . Anxiety     ROS:   All systems reviewed and negative except as noted in the HPI.  Past Surgical History  Procedure Date  . Appendectomy   . Cholecystectomy 01/1996    Dr. Woodfin Ganja  . Abdominal hysterectomy   . Lumbar disc surgery 1990  . Left knee arthroscopy   . Ivc filter placed 07/2010     Family History  Problem Relation Age of Onset  . Heart attack Father   . Cancer Sister   . Heart attack Brother      History   Social History  . Marital Status: Widowed    Spouse Name: N/A    Number of Children: 4  . Years of Education: N/A   Occupational History  . retired    Social History Main Topics  . Smoking status: Never Smoker   . Smokeless tobacco: Not on file  . Alcohol Use: No  . Drug Use: Not on file  . Sexually Active: Not on file   Other Topics Concern  . Not on file   Social History Narrative  . No narrative on file     BP 153/81  Pulse 89  Resp 18  Ht 5\' 6"  (1.676 m)  Wt 184 lb (83.462 kg)  BMI 29.70 kg/m2  Physical Exam:  Well appearing 75 yo woman, NAD HEENT: Unremarkable Neck:  No JVD, no thyromegally Lymphatics:  No adenopathy Back:  No CVA tenderness Lungs:  Clear. No wheezes, rales, or rhonchi. Well healed PPM  incision. HEART:  Regular rate rhythm, no murmurs, no rubs, no clicks Abd:  soft, positive bowel sounds, no organomegally, no rebound, no guarding Ext:  2 plus pulses, no edema, no cyanosis, no clubbing Skin:  No rashes no nodules Neuro:  CN II through XII intact, motor grossly intact  DEVICE  Normal device function.  See PaceArt for details.   Assess/Plan:

## 2011-08-25 NOTE — Assessment & Plan Note (Signed)
Her device is working normally. Will recheck in several months. 

## 2011-08-25 NOTE — Assessment & Plan Note (Signed)
Her blood pressure is elevated today. I have asked that she continue her exercise program, maintain a low sodium diet and take her medications as prescribed.

## 2011-08-25 NOTE — Patient Instructions (Signed)
Your physician wants you to follow-up in: 12 months with Dr Taylor You will receive a reminder letter in the mail two months in advance. If you don't receive a letter, please call our office to schedule the follow-up appointment.   Remote monitoring is used to monitor your Pacemaker of ICD from home. This monitoring reduces the number of office visits required to check your device to one time per year. It allows us to keep an eye on the functioning of your device to ensure it is working properly. You are scheduled for a device check from home on 11/23/2011. You may send your transmission at any time that day. If you have a wireless device, the transmission will be sent automatically. After your physician reviews your transmission, you will receive a postcard with your next transmission date.   

## 2011-08-26 ENCOUNTER — Telehealth: Payer: Self-pay | Admitting: Adult Health

## 2011-08-26 LAB — URINE CULTURE: Colony Count: >=100000

## 2011-08-26 MED ORDER — LEVOFLOXACIN 500 MG PO TABS: 500.0000 mg | ORAL_TABLET | Freq: Every day | ORAL | Status: AC

## 2011-08-26 NOTE — Telephone Encounter (Signed)
rx sent to pharm

## 2011-08-26 NOTE — Telephone Encounter (Signed)
Called with UTI results  rx sent to pharm  See lab note for full documentation

## 2011-08-28 ENCOUNTER — Telehealth: Payer: Self-pay | Admitting: Internal Medicine

## 2011-08-28 NOTE — Telephone Encounter (Signed)
Pt calling regarding nurse ordering heart monitor. Pt said she already has a heart monitor so need to order her one. Please return pt call to inform that this message was received.

## 2011-08-28 NOTE — Telephone Encounter (Signed)
Pt found carelink transmitter on Saturday. New transmitter was already ordered for pt so ordered return kit for pt to send transmitter back to [employer]. Pt aware of receiving new transmitter/return kit to send back.

## 2011-09-11 LAB — BASIC METABOLIC PANEL
CO2: 26
Calcium: 9
GFR calc Af Amer: >60
GFR calc non Af Amer: 58 — ABNORMAL LOW
Sodium: 140

## 2011-09-11 LAB — POCT HEMOGLOBIN-HEMACUE: Hemoglobin: 13.7

## 2011-09-15 LAB — COMPREHENSIVE METABOLIC PANEL
ALT: 18
AST: 23
Calcium: 9.6
GFR calc Af Amer: >60
Sodium: 142
Total Protein: 7.7

## 2011-09-15 LAB — PROTIME-INR: Prothrombin Time: 12.9

## 2011-09-15 LAB — CK TOTAL AND CKMB (NOT AT ARMC)
CK, MB: 0.6
CK, MB: 0.7
Relative Index: INVALID
Total CK: 34
Total CK: 50

## 2011-09-15 LAB — DIFFERENTIAL
Eosinophils Absolute: 0
Eosinophils Relative: 0
Lymphocytes Relative: 17
Lymphs Abs: 1.4
Monocytes Relative: 7

## 2011-09-15 LAB — TROPONIN I: Troponin I: 0.03

## 2011-09-15 LAB — APTT: aPTT: 27

## 2011-09-15 LAB — CBC
RBC: 4.58
WBC: 8.5

## 2011-09-15 LAB — TSH: TSH: 1.279

## 2011-09-26 ENCOUNTER — Encounter: Payer: Self-pay | Admitting: *Deleted

## 2011-10-02 ENCOUNTER — Other Ambulatory Visit: Payer: Self-pay | Admitting: Pulmonary Disease

## 2011-10-12 ENCOUNTER — Telehealth: Payer: Self-pay | Admitting: Pulmonary Disease

## 2011-10-12 NOTE — Telephone Encounter (Signed)
Gave verbal ok for refill on duoneb x 1 year

## 2011-10-25 ENCOUNTER — Other Ambulatory Visit: Payer: Self-pay | Admitting: Hematology & Oncology

## 2011-10-25 ENCOUNTER — Ambulatory Visit (HOSPITAL_BASED_OUTPATIENT_CLINIC_OR_DEPARTMENT_OTHER): Payer: Medicare Other | Admitting: Hematology & Oncology

## 2011-10-25 ENCOUNTER — Other Ambulatory Visit (HOSPITAL_BASED_OUTPATIENT_CLINIC_OR_DEPARTMENT_OTHER): Payer: Medicare Other | Admitting: Lab

## 2011-10-25 DIAGNOSIS — D649 Anemia, unspecified: Secondary | ICD-10-CM

## 2011-10-25 DIAGNOSIS — Z86718 Personal history of other venous thrombosis and embolism: Secondary | ICD-10-CM

## 2011-10-25 DIAGNOSIS — I82409 Acute embolism and thrombosis of unspecified deep veins of unspecified lower extremity: Secondary | ICD-10-CM

## 2011-10-25 DIAGNOSIS — Z7901 Long term (current) use of anticoagulants: Secondary | ICD-10-CM

## 2011-10-25 LAB — CBC WITH DIFFERENTIAL (CANCER CENTER ONLY)
BASO#: 0 10*3/uL (ref 0.0–0.2)
EOS%: 2.3 % (ref 0.0–7.0)
Eosinophils Absolute: 0.2 10*3/uL (ref 0.0–0.5)
HGB: 12.2 g/dL (ref 11.6–15.9)
LYMPH%: 18.9 % (ref 14.0–48.0)
MCH: 30.1 pg (ref 26.0–34.0)
MCHC: 34 g/dL (ref 32.0–36.0)
MCV: 89 fL (ref 81–101)
MONO%: 13.1 % — ABNORMAL HIGH (ref 0.0–13.0)
NEUT#: 5.1 10*3/uL (ref 1.5–6.5)

## 2011-10-25 NOTE — Progress Notes (Signed)
CSN: [id number] This office note has been dictated. ENNEVER,PETER R 10/25/2011

## 2011-10-26 LAB — D-DIMER, QUANTITATIVE: D-Dimer, Quant: 1.68 ug/mL-FEU — ABNORMAL HIGH (ref 0.00–0.48)

## 2011-10-26 LAB — RETICULOCYTES (CHCC): Retic Ct Pct: 1.5 % (ref 0.4–2.3)

## 2011-10-26 LAB — FERRITIN: Ferritin: 68 ng/mL (ref 10–291)

## 2011-10-26 NOTE — Progress Notes (Signed)
CC:   [name] [name]. [name] Basque, MD [name] Dark, MD  DIAGNOSIS:  Recurrent deep vein thrombosis of the right leg.  CURRENT THERAPY:  Arixtra 7.5 mg subcu daily.  INTERVAL HISTORY:  [name] [name] comes in for followup.  We last saw her back in August.  Since then, she has been doing well.  She actually likes being on the Arixtra.  I suggested that we possibly try one of the newer oral anticoagulants.  She would prefer not to.  She has noticed some pain down about her ankles bilaterally.  This has been more chronic than anything else.  There has been some swelling down by her ankles.  The patient has had no chest pain.  There has been no cough.  She has had no abdominal pain.  There has been no change in bowel or bladder habits. She has not noted any fever, sweats or chills.  PHYSICAL EXAMINATION:  This is a well-developed, well-nourished white female in no obvious distress.  Vital signs:  Temperature of 98, pulse 94, respiratory rate 18, blood pressure 124/83.  Weight is 186.  Head and neck:  Normocephalic, atraumatic skull.  There are no ocular or oral lesions.  There are no palpable cervical or supraclavicular lymph nodes. Lungs:  Clear to percussion and auscultation bilaterally.  Cardiac: Regular rate and rhythm with a normal S1 and S2.  There are no murmurs, rubs or bruits.  Abdomen:  Soft with good bowel sounds.  There is no palpable abdominal mass.  There is no fluid wave.  There is no palpable hepatosplenomegaly.  Back:  No tenderness over the spine, ribs, or hips. Extremities:  Maybe some trace edema in her lower legs.  She has some tenderness about her ankles bilaterally.  She has some nonpitting edema about her ankles.  There are some varicose veins in her lower extremities.  Neurologic:  No focal neurological deficits.  LABORATORY STUDIES:  White count is 7.8, hemoglobin 12.2, hematocrit 35.9 and platelet count 188.  IMPRESSION:  [name] [name] is a 75 year old white female with a history  of recurrent deep vein thrombosis of the right leg.  She is on lifelong anticoagulation.  Again, likes to be on the Arixtra.  We will go ahead and continue on this.  We will check her D-dimer levels.  I do not see a need for a Doppler test or any other x-ray studies right now as she is asymptomatic.    ______________________________ Josph Macho, M.D. PRE/MEDQ  D:  10/26/2011  T:  10/26/2011  Job:  [id number]

## 2011-10-28 ENCOUNTER — Other Ambulatory Visit: Payer: Self-pay | Admitting: Pulmonary Disease

## 2011-11-03 ENCOUNTER — Other Ambulatory Visit: Payer: Self-pay

## 2011-11-03 ENCOUNTER — Encounter (HOSPITAL_COMMUNITY): Payer: Self-pay | Admitting: Emergency Medicine

## 2011-11-03 ENCOUNTER — Emergency Department (HOSPITAL_COMMUNITY): Payer: PRIVATE HEALTH INSURANCE

## 2011-11-03 ENCOUNTER — Emergency Department (HOSPITAL_COMMUNITY)
Admission: EM | Admit: 2011-11-03 | Discharge: 2011-11-03 | Disposition: A | Discharge status: Home / Self Care | Payer: PRIVATE HEALTH INSURANCE | Attending: Emergency Medicine | Admitting: Emergency Medicine

## 2011-11-03 DIAGNOSIS — E785 Hyperlipidemia, unspecified: Secondary | ICD-10-CM | POA: Insufficient documentation

## 2011-11-03 DIAGNOSIS — R079 Chest pain, unspecified: Secondary | ICD-10-CM | POA: Insufficient documentation

## 2011-11-03 DIAGNOSIS — K1370 Unspecified lesions of oral mucosa: Secondary | ICD-10-CM | POA: Insufficient documentation

## 2011-11-03 DIAGNOSIS — T07XXXA Unspecified multiple injuries, initial encounter: Secondary | ICD-10-CM

## 2011-11-03 DIAGNOSIS — I10 Essential (primary) hypertension: Secondary | ICD-10-CM | POA: Insufficient documentation

## 2011-11-03 DIAGNOSIS — M25539 Pain in unspecified wrist: Secondary | ICD-10-CM | POA: Insufficient documentation

## 2011-11-03 DIAGNOSIS — Z8679 Personal history of other diseases of the circulatory system: Secondary | ICD-10-CM | POA: Insufficient documentation

## 2011-11-03 DIAGNOSIS — E669 Obesity, unspecified: Secondary | ICD-10-CM | POA: Insufficient documentation

## 2011-11-03 DIAGNOSIS — R51 Headache: Secondary | ICD-10-CM | POA: Insufficient documentation

## 2011-11-03 DIAGNOSIS — R6889 Other general symptoms and signs: Secondary | ICD-10-CM | POA: Insufficient documentation

## 2011-11-03 DIAGNOSIS — I2510 Atherosclerotic heart disease of native coronary artery without angina pectoris: Secondary | ICD-10-CM | POA: Insufficient documentation

## 2011-11-03 LAB — URINALYSIS, ROUTINE W REFLEX MICROSCOPIC
Bilirubin Urine: NEGATIVE
Ketones, ur: NEGATIVE mg/dL
Nitrite: NEGATIVE
Urobilinogen, UA: 0.2 mg/dL (ref 0.0–1.0)

## 2011-11-03 LAB — DIFFERENTIAL
Basophils Absolute: 0 10*3/uL (ref 0.0–0.1)
Basophils Relative: 0 % (ref 0–1)
Eosinophils Absolute: 0.2 10*3/uL (ref 0.0–0.7)
Eosinophils Relative: 2 % (ref 0–5)
Monocytes Absolute: 1.1 10*3/uL — ABNORMAL HIGH (ref 0.1–1.0)

## 2011-11-03 LAB — POCT I-STAT, CHEM 8
BUN: 19 mg/dL (ref 6–23)
Chloride: 106 mEq/L (ref 96–112)
HCT: 36 % (ref 36.0–46.0)
Potassium: 3.5 mEq/L (ref 3.5–5.1)
Sodium: 143 mEq/L (ref 135–145)

## 2011-11-03 LAB — CBC
HCT: 36.1 % (ref 36.0–46.0)
MCH: 29.6 pg (ref 26.0–34.0)
MCHC: 32.7 g/dL (ref 30.0–36.0)
MCV: 90.5 fL (ref 78.0–100.0)
RDW: 14.1 % (ref 11.5–15.5)

## 2011-11-03 LAB — URINE MICROSCOPIC-ADD ON

## 2011-11-03 LAB — PROTIME-INR: INR: 1.1 (ref 0.00–1.49)

## 2011-11-03 MED ORDER — HYDROCODONE-ACETAMINOPHEN 5-325 MG PO TABS
1.0000 | ORAL_TABLET | ORAL | Status: AC | PRN
Start: 2011-11-03 — End: 2011-11-13
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
## 2011-11-06 ENCOUNTER — Other Ambulatory Visit: Payer: Self-pay | Admitting: Pulmonary Disease

## 2011-11-06 LAB — URINE CULTURE: Culture  Setup Time: 201211162217

## 2011-11-08 ENCOUNTER — Other Ambulatory Visit: Payer: Self-pay | Admitting: *Deleted

## 2011-11-08 MED ORDER — CLORAZEPATE DIPOTASSIUM 7.5 MG PO TABS: 7.5000 mg | ORAL_TABLET | Freq: Three times a day (TID) | ORAL | Status: DC

## 2011-11-23 ENCOUNTER — Encounter: Payer: Medicare Other | Admitting: *Deleted

## 2011-11-30 ENCOUNTER — Encounter: Payer: Self-pay | Admitting: *Deleted

## 2012-01-04 ENCOUNTER — Telehealth: Payer: Self-pay | Admitting: Pulmonary Disease

## 2012-01-04 MED ORDER — AZITHROMYCIN 250 MG PO TABS: ORAL_TABLET | ORAL | Status: AC

## 2012-01-04 NOTE — Telephone Encounter (Signed)
Spoke with pt Advised of recs per SN Pt verbalized understanding Rx was sent to pharm.

## 2012-01-04 NOTE — Telephone Encounter (Signed)
Per SN---we have no openings---ok to take the hycodan, add mucinex 600mg   2 po bid with plenty of fluids, zpak #1  Take as directed with no refills, use some throat lozenges.  thanks

## 2012-01-04 NOTE — Telephone Encounter (Signed)
Pt c/o non prod cough for past 2 days.  Chest congestion.  Mild SOB with cough.  Denies fever or body aches.  Pt taking Hycodan 2 tsp every 4 hrs but not helping.  Please advise.

## 2012-01-11 ENCOUNTER — Telehealth: Payer: Self-pay | Admitting: *Deleted
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
## 2012-01-12 MED ORDER — HYDROCODONE-ACETAMINOPHEN 5-325 MG PO TABS: 1.0000 | ORAL_TABLET | Freq: Three times a day (TID) | ORAL | Status: AC | PRN

## 2012-01-12 NOTE — Telephone Encounter (Signed)
Per SN--ok for vicodin  #90  1 po tid prn pain, not to exceed 3 per day with 1 refill.  This has been called to the pts pharmacy.

## 2012-01-17 ENCOUNTER — Encounter: Payer: Self-pay | Admitting: Pulmonary Disease

## 2012-01-17 ENCOUNTER — Ambulatory Visit (INDEPENDENT_AMBULATORY_CARE_PROVIDER_SITE_OTHER): Payer: Medicare Other | Admitting: Pulmonary Disease

## 2012-01-17 VITALS — BP 124/88 | HR 79 | Temp 97.3°F | Ht 66.0 in | Wt 175.2 lb

## 2012-01-17 DIAGNOSIS — J209 Acute bronchitis, unspecified: Secondary | ICD-10-CM

## 2012-01-17 DIAGNOSIS — N259 Disorder resulting from impaired renal tubular function, unspecified: Secondary | ICD-10-CM

## 2012-01-17 DIAGNOSIS — R55 Syncope and collapse: Secondary | ICD-10-CM

## 2012-01-17 DIAGNOSIS — I10 Essential (primary) hypertension: Secondary | ICD-10-CM

## 2012-01-17 DIAGNOSIS — D869 Sarcoidosis, unspecified: Secondary | ICD-10-CM

## 2012-01-17 DIAGNOSIS — F411 Generalized anxiety disorder: Secondary | ICD-10-CM

## 2012-01-17 DIAGNOSIS — I2699 Other pulmonary embolism without acute cor pulmonale: Secondary | ICD-10-CM

## 2012-01-17 DIAGNOSIS — Z950 Presence of cardiac pacemaker: Secondary | ICD-10-CM

## 2012-01-17 DIAGNOSIS — M1990 Unspecified osteoarthritis, unspecified site: Secondary | ICD-10-CM

## 2012-01-17 DIAGNOSIS — I2510 Atherosclerotic heart disease of native coronary artery without angina pectoris: Secondary | ICD-10-CM

## 2012-01-17 DIAGNOSIS — E785 Hyperlipidemia, unspecified: Secondary | ICD-10-CM

## 2012-01-17 DIAGNOSIS — I82409 Acute embolism and thrombosis of unspecified deep veins of unspecified lower extremity: Secondary | ICD-10-CM

## 2012-01-17 MED ORDER — METOPROLOL SUCCINATE ER 25 MG PO TB24: 25.0000 mg | ORAL_TABLET | Freq: Every day | ORAL | Status: DC

## 2012-01-17 MED ORDER — MOMETASONE FUROATE 50 MCG/ACT NA SUSP: 2.0000 | Freq: Every day | NASAL | Status: DC | PRN

## 2012-01-17 MED ORDER — PREDNISONE (PAK) 5 MG PO TABS: ORAL_TABLET | ORAL | Status: DC

## 2012-01-17 MED ORDER — FUROSEMIDE 20 MG PO TABS: ORAL_TABLET | ORAL | Status: DC

## 2012-01-17 MED ORDER — OMEPRAZOLE 40 MG PO CPDR: 40.0000 mg | DELAYED_RELEASE_CAPSULE | Freq: Two times a day (BID) | ORAL | Status: DC

## 2012-01-17 MED ORDER — HYDROCOD POLST-CHLORPHEN POLST 10-8 MG/5ML PO LQCR: 5.0000 mL | Freq: Two times a day (BID) | ORAL | Status: DC

## 2012-01-17 MED ORDER — CLORAZEPATE DIPOTASSIUM 7.5 MG PO TABS: 7.5000 mg | ORAL_TABLET | Freq: Three times a day (TID) | ORAL | Status: DC
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
## 2012-01-17 NOTE — Patient Instructions (Signed)
Today we updated your med list in our EPIC system...    Continue your current medications the same...    I agree w/ drEnnever's thought to change the Arixtra shots to the Xarelto pill...  For your Asthmatic Bronchitis>    You have finished the ZPak antibiotic>    And you have the Hydrocodone pain pill to use as needed...    We wrote a new prescription for a Prednisone dosepak for the inflammation>    And a new Rx for TUSSIONEX to use for the cough...  Call for any questions...  Let's plan a follow up visit w/ fasting blood work in 4-6 months.[name] KitchenMarland Kitchen

## 2012-02-15 ENCOUNTER — Other Ambulatory Visit (HOSPITAL_BASED_OUTPATIENT_CLINIC_OR_DEPARTMENT_OTHER): Payer: Medicare Other | Admitting: Lab

## 2012-02-15 ENCOUNTER — Ambulatory Visit (HOSPITAL_BASED_OUTPATIENT_CLINIC_OR_DEPARTMENT_OTHER): Payer: Medicare Other | Admitting: Hematology & Oncology

## 2012-02-15 DIAGNOSIS — I82409 Acute embolism and thrombosis of unspecified deep veins of unspecified lower extremity: Secondary | ICD-10-CM

## 2012-02-15 DIAGNOSIS — M2540 Effusion, unspecified joint: Secondary | ICD-10-CM

## 2012-02-15 DIAGNOSIS — I824Z9 Acute embolism and thrombosis of unspecified deep veins of unspecified distal lower extremity: Secondary | ICD-10-CM

## 2012-02-15 DIAGNOSIS — I2699 Other pulmonary embolism without acute cor pulmonale: Secondary | ICD-10-CM

## 2012-02-15 LAB — CBC WITH DIFFERENTIAL (CANCER CENTER ONLY)
EOS%: 2.4 % (ref 0.0–7.0)
Eosinophils Absolute: 0.2 10*3/uL (ref 0.0–0.5)
LYMPH#: 2 10*3/uL (ref 0.9–3.3)
MCH: 29.1 pg (ref 26.0–34.0)
MCHC: 32.4 g/dL (ref 32.0–36.0)
MONO%: 13 % (ref 0.0–13.0)
NEUT#: 4.5 10*3/uL (ref 1.5–6.5)
Platelets: 195 10*3/uL (ref 145–400)
RBC: 4.3 10*6/uL (ref 3.70–5.32)

## 2012-02-15 MED ORDER — RIVAROXABAN 20 MG PO TABS: 20.0000 mg | ORAL_TABLET | Freq: Every day | ORAL | Status: DC
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
## 2012-02-15 NOTE — Progress Notes (Signed)
Addended by: Josph Macho on: 02/15/2012 05:31 PM   Modules accepted: Orders, Medications

## 2012-02-15 NOTE — Progress Notes (Signed)
This office note has been dictated.

## 2012-02-16 LAB — D-DIMER, QUANTITATIVE: D-Dimer, Quant: 1.32 ug/mL-FEU — ABNORMAL HIGH (ref 0.00–0.48)

## 2012-02-17 ENCOUNTER — Other Ambulatory Visit: Payer: Self-pay | Admitting: Pulmonary Disease
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
## 2012-02-23 ENCOUNTER — Other Ambulatory Visit: Payer: Self-pay | Admitting: *Deleted

## 2012-02-23 DIAGNOSIS — I82409 Acute embolism and thrombosis of unspecified deep veins of unspecified lower extremity: Secondary | ICD-10-CM

## 2012-02-23 MED ORDER — RIVAROXABAN 20 MG PO TABS: 1.0000 | ORAL_TABLET | Freq: Every day | ORAL | Status: DC

## 2012-03-29 ENCOUNTER — Ambulatory Visit: Payer: Medicare Other | Admitting: Hematology & Oncology

## 2012-03-29 ENCOUNTER — Other Ambulatory Visit: Payer: Medicare Other | Admitting: Lab

## 2012-04-10 ENCOUNTER — Other Ambulatory Visit: Payer: Self-pay | Admitting: Pulmonary Disease

## 2012-04-24 ENCOUNTER — Ambulatory Visit (HOSPITAL_BASED_OUTPATIENT_CLINIC_OR_DEPARTMENT_OTHER): Payer: Medicare Other | Admitting: Hematology & Oncology

## 2012-04-24 ENCOUNTER — Other Ambulatory Visit (HOSPITAL_BASED_OUTPATIENT_CLINIC_OR_DEPARTMENT_OTHER): Payer: Medicare Other | Admitting: Lab

## 2012-04-24 VITALS — BP 133/81 | HR 80 | Temp 97.6°F | Ht 65.0 in | Wt 174.0 lb

## 2012-04-24 DIAGNOSIS — I824Z9 Acute embolism and thrombosis of unspecified deep veins of unspecified distal lower extremity: Secondary | ICD-10-CM

## 2012-04-24 DIAGNOSIS — I82409 Acute embolism and thrombosis of unspecified deep veins of unspecified lower extremity: Secondary | ICD-10-CM

## 2012-04-24 DIAGNOSIS — I2699 Other pulmonary embolism without acute cor pulmonale: Secondary | ICD-10-CM

## 2012-04-24 LAB — CBC WITH DIFFERENTIAL (CANCER CENTER ONLY)
BASO%: 0.5 % (ref 0.0–2.0)
LYMPH#: 1.9 10*3/uL (ref 0.9–3.3)
MONO#: 0.9 10*3/uL (ref 0.1–0.9)
Platelets: 187 10*3/uL (ref 145–400)
RDW: 13.8 % (ref 11.1–15.7)
WBC: 7.7 10*3/uL (ref 3.9–10.0)

## 2012-04-24 NOTE — Progress Notes (Signed)
This office note has been dictated.

## 2012-04-25 ENCOUNTER — Telehealth: Payer: Self-pay | Admitting: Hematology & Oncology

## 2012-04-25 LAB — D-DIMER, QUANTITATIVE: D-Dimer, Quant: 1.24 ug/mL-FEU — ABNORMAL HIGH (ref 0.00–0.48)

## 2012-04-25 NOTE — Telephone Encounter (Signed)
Mailed 8-1 schedule

## 2012-04-25 NOTE — Progress Notes (Signed)
CC:   [name] [name]. [name] Basque, MD  DIAGNOSIS:  Recurrent deep vein thrombosis of the right leg.  CURRENT THERAPY:  Xarelto 20 mg p.o. daily.  INTERIM HISTORY:  [name] [name] comes in for followup.  She is doing well. She feels well.  She is doing nicely on the Xarelto.  She is getting ready go up to [place] to visit her son.  This is going to be on Saturday.  Her daughter is going to drive her.  She does have compression stockings on her legs.  I told to make sure that she got out at every welcome center to walk.  She has had no problems with respect to leg pain or leg swelling.  She has had no bleeding.  Her last D-dimer was 1.32.  PHYSICAL EXAMINATION:  This is a well-developed, well-nourished white female in no obvious distress.  Vital signs 97.6, pulse 80, respiratory rate 22, blood pressure 133/81.  Weight is 174.  Head and neck exam shows a normocephalic, atraumatic skull.  There are no ocular or oral lesions.  There are no palpable cervical or supraclavicular lymph nodes. Lungs:  Clear to percussion and auscultation bilaterally.  Cardiac: Regular rate and rhythm with a normal S1 and S2.  There are no murmurs, rubs or bruits.  Abdomen:  Soft with good bowel sounds.  There is no palpable abdominal mass.  She does have some collateral veins under the skin at the at abdominal wall.  There is no palpable hepatosplenomegaly. Extremities show no clubbing, cyanosis or edema.  She has good range motion of her joints.  She has good pulses in her distal extremities. Skin:  No rashes, ecchymoses or petechia.  LABORATORY STUDIES:  White cell count 7.7, hemoglobin 13, hematocrit 39, platelet count is 187.  IMPRESSION:  [name] [name] is a 76 year old white female with recurrent deep vein thrombosis of the right leg.  She does not have any obvious thrombophilic state.  She has had an extensive workup.  She is on Xarelto now.  She is __________ on Xarelto, she does not need have her  blood checked.  We  will continue to monitor her D-dimers.  We will plan to get her back to see [place] in another 3 months.  I think this is reasonable for followup.   ______________________________ Josph Macho, M.D. PRE/MEDQ  D:  04/24/2012  T:  04/25/2012  Job:  [id number]

## 2012-05-13 ENCOUNTER — Encounter: Payer: Self-pay | Admitting: *Deleted

## 2012-05-22 ENCOUNTER — Encounter: Payer: Self-pay | Admitting: Pulmonary Disease

## 2012-05-22 ENCOUNTER — Other Ambulatory Visit (INDEPENDENT_AMBULATORY_CARE_PROVIDER_SITE_OTHER): Payer: Medicare Other

## 2012-05-22 ENCOUNTER — Ambulatory Visit (INDEPENDENT_AMBULATORY_CARE_PROVIDER_SITE_OTHER): Payer: Medicare Other | Admitting: Pulmonary Disease

## 2012-05-22 ENCOUNTER — Ambulatory Visit (INDEPENDENT_AMBULATORY_CARE_PROVIDER_SITE_OTHER)
Admission: RE | Admit: 2012-05-22 | Discharge: 2012-05-22 | Disposition: A | Discharge status: Home / Self Care | Payer: Medicare Other | Source: Ambulatory Visit | Attending: Pulmonary Disease | Admitting: Pulmonary Disease

## 2012-05-22 VITALS — BP 150/80 | HR 102 | Temp 97.1°F | Ht 64.5 in | Wt 176.8 lb

## 2012-05-22 DIAGNOSIS — F411 Generalized anxiety disorder: Secondary | ICD-10-CM

## 2012-05-22 DIAGNOSIS — I2699 Other pulmonary embolism without acute cor pulmonale: Secondary | ICD-10-CM

## 2012-05-22 DIAGNOSIS — I10 Essential (primary) hypertension: Secondary | ICD-10-CM

## 2012-05-22 DIAGNOSIS — N259 Disorder resulting from impaired renal tubular function, unspecified: Secondary | ICD-10-CM

## 2012-05-22 DIAGNOSIS — J209 Acute bronchitis, unspecified: Secondary | ICD-10-CM

## 2012-05-22 DIAGNOSIS — I2510 Atherosclerotic heart disease of native coronary artery without angina pectoris: Secondary | ICD-10-CM

## 2012-05-22 DIAGNOSIS — E559 Vitamin D deficiency, unspecified: Secondary | ICD-10-CM

## 2012-05-22 DIAGNOSIS — E785 Hyperlipidemia, unspecified: Secondary | ICD-10-CM

## 2012-05-22 DIAGNOSIS — I2789 Other specified pulmonary heart diseases: Secondary | ICD-10-CM

## 2012-05-22 DIAGNOSIS — I82409 Acute embolism and thrombosis of unspecified deep veins of unspecified lower extremity: Secondary | ICD-10-CM

## 2012-05-22 DIAGNOSIS — M1990 Unspecified osteoarthritis, unspecified site: Secondary | ICD-10-CM

## 2012-05-22 DIAGNOSIS — Z950 Presence of cardiac pacemaker: Secondary | ICD-10-CM

## 2012-05-22 DIAGNOSIS — D869 Sarcoidosis, unspecified: Secondary | ICD-10-CM

## 2012-05-22 DIAGNOSIS — I6350 Cerebral infarction due to unspecified occlusion or stenosis of unspecified cerebral artery: Secondary | ICD-10-CM

## 2012-05-22 NOTE — Patient Instructions (Signed)
Today we updated your med list in our EPIC system...    Continue your current medications the same...  Today we checked your follow up CXR & FASTING blood work...    We will call you w/ the results...  Let's plan a follow up eval in 6 months.[name] KitchenMarland Kitchen
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
## 2012-05-23 LAB — BASIC METABOLIC PANEL
CO2: 29 mEq/L (ref 19–32)
Chloride: 107 mEq/L (ref 96–112)
Sodium: 146 mEq/L — ABNORMAL HIGH (ref 135–145)

## 2012-05-23 LAB — HEPATIC FUNCTION PANEL
ALT: 15 U/L (ref 0–35)
Alkaline Phosphatase: 55 U/L (ref 39–117)
Bilirubin, Direct: 0.1 mg/dL (ref 0.0–0.3)
Total Protein: 7.7 g/dL (ref 6.0–8.3)

## 2012-05-23 LAB — LIPID PANEL: Total CHOL/HDL Ratio: 3

## 2012-05-27 ENCOUNTER — Other Ambulatory Visit: Payer: Self-pay | Admitting: Hematology & Oncology

## 2012-05-28 ENCOUNTER — Encounter: Payer: Self-pay | Admitting: Gynecologic Oncology

## 2012-05-28 ENCOUNTER — Ambulatory Visit: Payer: Medicare Other | Attending: Gynecologic Oncology | Admitting: Gynecologic Oncology

## 2012-05-28 VITALS — BP 148/82 | HR 90 | Temp 98.1°F | Resp 14 | Ht 63.82 in | Wt 176.3 lb

## 2012-05-28 DIAGNOSIS — Z86718 Personal history of other venous thrombosis and embolism: Secondary | ICD-10-CM | POA: Insufficient documentation

## 2012-05-28 DIAGNOSIS — I2510 Atherosclerotic heart disease of native coronary artery without angina pectoris: Secondary | ICD-10-CM | POA: Insufficient documentation

## 2012-05-28 DIAGNOSIS — I10 Essential (primary) hypertension: Secondary | ICD-10-CM | POA: Insufficient documentation

## 2012-05-28 DIAGNOSIS — C519 Malignant neoplasm of vulva, unspecified: Secondary | ICD-10-CM | POA: Insufficient documentation

## 2012-05-28 DIAGNOSIS — D069 Carcinoma in situ of cervix, unspecified: Secondary | ICD-10-CM | POA: Insufficient documentation

## 2012-05-28 DIAGNOSIS — D071 Carcinoma in situ of vulva: Secondary | ICD-10-CM

## 2012-05-28 DIAGNOSIS — Z86711 Personal history of pulmonary embolism: Secondary | ICD-10-CM | POA: Insufficient documentation

## 2012-05-28 DIAGNOSIS — G4733 Obstructive sleep apnea (adult) (pediatric): Secondary | ICD-10-CM | POA: Insufficient documentation

## 2012-05-28 DIAGNOSIS — Z8673 Personal history of transient ischemic attack (TIA), and cerebral infarction without residual deficits: Secondary | ICD-10-CM | POA: Insufficient documentation

## 2012-05-28 DIAGNOSIS — E785 Hyperlipidemia, unspecified: Secondary | ICD-10-CM | POA: Insufficient documentation

## 2012-05-28 DIAGNOSIS — Z79899 Other long term (current) drug therapy: Secondary | ICD-10-CM | POA: Insufficient documentation

## 2012-05-28 DIAGNOSIS — Z950 Presence of cardiac pacemaker: Secondary | ICD-10-CM | POA: Insufficient documentation

## 2012-05-28 NOTE — Progress Notes (Signed)
Consult Note: Gyn-Onc  [name] [name] 76 y.o. female  CC:  Chief Complaint  Patient presents with  . Vulvar Cancer    New pt    HPI: Patient is seen today in consultation request of Dr. Juliene Pina for her vulvar carcinoma in situ.  Patient is a 77 year old gravida 5 para 4 aborta 1 who approximately 6 months ago began experiencing some dysuria. She was diagnosed with 3 urinary tract infections and went to see a urologist. The neurologist felt that she might need to see infectious disease specialists referred her back to Dr. Kriste Basque her primary care physician. Dr. Kriste Basque. felt to be most appropriate for her to see a gynecologist she was referred to Dr. Juliene Pina. On exam Dr. 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 There is no evidence of any invasive malignancy. It is for this reason that she is referred to [place] today.  Interval History:  As above.  Review of Systems She denies any chest pain, nausea, shortness of breath. She has had some episodes of rectal bleeding felt to be secondary to external hemorrhoids. Her dysuria is better. She is no bleeding from his ulcerative lesion. She does have multiple medical problems. She has refused to be on any Coumadin as she states she had a stroke in 2006 secondary to Coumadin. She is currently on Xarelto as she did not want to be on the subcutaneous Arixtra. She is under the care of Dr. Arlan Organ for this. She denies any unintentional weight loss weight gain. She helps care for her 9 year old mother. She denies any unintentional weight loss weight gain. She denies any abdominal or pelvic pain change in her bowel or bladder habits. She denies any lower extremity edema.  10 point review of systems is otherwise negative.  Current Meds:  Outpatient Encounter Prescriptions as of 05/28/2012  Medication Sig Dispense Refill  . cetirizine  (ZYRTEC) 10 MG tablet Take 10 mg by mouth daily.        . chlorpheniramine-HYDROcodone (TUSSIONEX PENNKINETIC ER) 10-8 MG/5ML LQCR Take 5 mLs by mouth every 12 (twelve) hours.  140 mL  5  . Cholecalciferol (VITAMIN D) 1000 UNITS capsule Take 1,000 Units by mouth daily.        . clobetasol ointment (TEMOVATE) 0.05 % every morning.      . clorazepate (TRANXENE) 7.5 MG tablet Take 1 tablet (7.5 mg total) by mouth 3 (three) times daily. As needed for nerves  270 tablet  3  . Diphenhyd-Hydrocort-Nystatin (FIRST-DUKES MOUTHWASH) SUSP Take 5 mLs by mouth 4 (four) times daily as needed. gargle and swallow up to 4 times daily as needed for sore throat      . FLOVENT HFA 44 MCG/ACT inhaler INHALE 2 PUFFS 2 TIMES A DAY  10.6 g  5  . folic acid (FOLVITE) 1 MG tablet Take 1 mg by mouth daily.        . furosemide (LASIX) 20 MG tablet Take 2 tablets by mouth every morning  180 tablet  3  . ipratropium-albuterol (DUONEB) 0.5-2.5 (3) MG/3ML SOLN Take 3 mLs by nebulization 2 (two) times daily.        . metoprolol succinate (TOPROL-XL) 25 MG 24 hr tablet Take 1 tablet (25 mg total) by mouth daily.  90 tablet  3  . mometasone (NASONEX) 50 MCG/ACT nasal spray Place 2 sprays into the nose daily as needed. For congestion  51 g  3  . Multiple Vitamins-Minerals (CENTRUM SILVER PO) Take 1 tablet by mouth daily.        [name] Kitchen nystatin-triamcinolone (MYCOLOG II) cream 1 day or 1 dose.      [name] Kitchen omeprazole (PRILOSEC) 40 MG capsule Take 1 capsule (40 mg total) by mouth 2 (two) times daily.  180 capsule  3  . Polyethyl Glycol-Propyl Glycol (SYSTANE) 0.4-0.3 % SOLN Apply 2 drops to eye daily.       . pravastatin (PRAVACHOL) 40 MG tablet TAKE 1 TABLET BY MOUTH EVERY DAY  90 tablet  2  . sennosides-docusate sodium (SENOKOT-S) 8.6-50 MG tablet Take 2 tablets by mouth at bedtime.        . silver sulfADIAZINE (SILVADENE) 1 % cream Nightly.      [name] 20 MG TABS TAKE 1 TABLET BY MOUTH EVERY DAY  30 tablet  2    Allergy:  Allergies    Allergen Reactions  . Sulfa Antibiotics Other (See Comments)    Doesn't like to drink lots of water.  . Sulfonamide Derivatives     REACTION: nausea  . Sumatriptan Other (See Comments)    Reaction unknown    Social Hx:   History   Social History  . Marital Status: Widowed    Spouse Name: N/A    Number of Children: 4  . Years of Education: N/A   Occupational History  . retired    Social History Main Topics  . Smoking status: Never Smoker   . Smokeless tobacco: Not on file  . Alcohol Use: No  . Drug Use: Not on file  . Sexually Active: Not on file   Other Topics Concern  . Not on file   Social History Narrative  . No narrative on file    Past Surgical Hx:  Past Surgical History  Procedure Date  . Appendectomy   . Cholecystectomy 01/1996    Dr. Woodfin Ganja  . Abdominal hysterectomy   . Lumbar disc surgery 1990  . Left knee arthroscopy   . Ivc filter placed 07/2010    Past Medical Hx:  Past Medical History  Diagnosis Date  . Allergic rhinitis   . OSA (obstructive sleep apnea)   . Sarcoidosis   . Pulmonary hypertension   . Asthmatic bronchitis   . Pulmonary embolism   . Right leg DVT   . Hypertension   . CAD (coronary artery disease)   . Syncope   . Cardiac pacemaker in situ   . Venous insufficiency   . Hyperlipidemia   . Obesity   . PUD (peptic ulcer disease)   . Hemorrhoids   . Renal insufficiency   . UTI (lower urinary tract infection)   . DJD (degenerative joint disease)   . Cervicalgia   . Lumbar back pain   . Stroke   . Anxiety     Family Hx:  Family History  Problem Relation Age of Onset  . Heart attack Father   . Cancer Sister   . Heart attack Brother     Vitals:  Blood pressure 148/82, pulse 90, temperature 98.1 F (36.7 C), temperature source Oral, resp. rate 14, height 5' 3.82" (1.621 m), weight 176 lb 4.8 oz (79.969 kg).  Physical Exam:  Well-nourished well-developed female who appears stated age in no acute  distress.  Neck: Supple, no lymphadenopathy no thyromegaly.  Lungs: Clear to auscultation bilaterally.  Cardiovascular: Regular rate and rhythm.  Abdomen: Soft, nontender, nondistended. His well-healed vertical midline incision. There is no incisional hernia.  Groins: No lymphadenopathy.  Pelvic: External genitalia erythematous ulcerative plaque lesion involving the inferior aspect of the right labia majora extending just to the vagina. The lesion measures approximately 3 x 3 cm. There is no mass palpable. It appears superficial.  Biopsy site is healing well.   Assessment/Plan: 76 year old with VIN 3. I discussed 2 options with her. Ideally we would like to asleep have her come off of her Zaroxolyn perform a wide local excision with an attempt to clear margins and primary closure. If either her cardiologist or her hematologist feel that she could you cannot go to sleep for surgery or should not come offers a relative then we could proceed with conscious sedation with local anesthesia. We couldn't perform random biopsies of the lesion in followup with laser ablation to she would rather proceed with a white local excision which I would as well however need to consider multiple other medical issues. We will reach out to Dr. Ladona Ridgel and Dr. Myna Hidalgo as well as her primary physician Dr. Kriste Basque determine the best disposition.  She does take care of his primary caretaker to her 90s external daughter. Due to needing arrange care for her mother as well as her own post operative care she would need to delay the surgery until after July 20 when her children are available. I think this would be reasonable. We will tentatively plan her surgery on July 30. This should allow adequate time to contact her other physicians. She'll return to see [place] on July 17 for preoperative consultation  Humbert Morozov A., MD 05/28/2012, 2:31 PM

## 2012-05-28 NOTE — Patient Instructions (Signed)
Follow up as scheduled.  

## 2012-05-29 ENCOUNTER — Encounter: Payer: Self-pay | Admitting: Internal Medicine

## 2012-05-29 ENCOUNTER — Ambulatory Visit (INDEPENDENT_AMBULATORY_CARE_PROVIDER_SITE_OTHER): Payer: Medicare Other | Admitting: *Deleted

## 2012-05-29 DIAGNOSIS — I442 Atrioventricular block, complete: Secondary | ICD-10-CM

## 2012-05-29 LAB — PACEMAKER DEVICE OBSERVATION
AL AMPLITUDE: 4 mv
AL THRESHOLD: 0.5 V
RV LEAD AMPLITUDE: 8 mv

## 2012-05-29 NOTE — Progress Notes (Signed)
Pacer check in clinic  

## 2012-06-05 ENCOUNTER — Encounter: Payer: Self-pay | Admitting: *Deleted

## 2012-06-05 ENCOUNTER — Telehealth: Payer: Self-pay | Admitting: Internal Medicine

## 2012-06-05 NOTE — Telephone Encounter (Signed)
New problem:  Per Harriett Sine calling, patient need cardiac clearance for general surgery - take about an hour Wide local incision of vulvar lesion 7/30.

## 2012-06-05 NOTE — Telephone Encounter (Signed)
Per Dr Ladona Ridgel okay to proceed with surgery.  Hold Arixtra for 1 day prior to procedure

## 2012-06-05 NOTE — Progress Notes (Unsigned)
Call placed to Dr Myna Hidalgo regarding stopping Xarelto prior to surgery.  Call placed to Dr Sharrell Ku for cardiac clearance.

## 2012-07-03 ENCOUNTER — Encounter: Payer: Self-pay | Admitting: Gynecologic Oncology

## 2012-07-03 ENCOUNTER — Other Ambulatory Visit: Payer: Self-pay | Admitting: *Deleted

## 2012-07-03 ENCOUNTER — Ambulatory Visit: Payer: Medicare Other | Attending: Gynecologic Oncology | Admitting: Gynecologic Oncology

## 2012-07-03 VITALS — BP 142/80 | HR 78 | Temp 98.3°F | Resp 16 | Ht 64.09 in | Wt 177.3 lb

## 2012-07-03 DIAGNOSIS — I2510 Atherosclerotic heart disease of native coronary artery without angina pectoris: Secondary | ICD-10-CM | POA: Insufficient documentation

## 2012-07-03 DIAGNOSIS — I10 Essential (primary) hypertension: Secondary | ICD-10-CM | POA: Insufficient documentation

## 2012-07-03 DIAGNOSIS — Z950 Presence of cardiac pacemaker: Secondary | ICD-10-CM | POA: Insufficient documentation

## 2012-07-03 DIAGNOSIS — K649 Unspecified hemorrhoids: Secondary | ICD-10-CM | POA: Insufficient documentation

## 2012-07-03 DIAGNOSIS — I2789 Other specified pulmonary heart diseases: Secondary | ICD-10-CM | POA: Insufficient documentation

## 2012-07-03 DIAGNOSIS — G4733 Obstructive sleep apnea (adult) (pediatric): Secondary | ICD-10-CM | POA: Insufficient documentation

## 2012-07-03 DIAGNOSIS — F411 Generalized anxiety disorder: Secondary | ICD-10-CM | POA: Insufficient documentation

## 2012-07-03 DIAGNOSIS — D869 Sarcoidosis, unspecified: Secondary | ICD-10-CM | POA: Insufficient documentation

## 2012-07-03 DIAGNOSIS — Z90710 Acquired absence of both cervix and uterus: Secondary | ICD-10-CM | POA: Insufficient documentation

## 2012-07-03 DIAGNOSIS — Z7901 Long term (current) use of anticoagulants: Secondary | ICD-10-CM | POA: Insufficient documentation

## 2012-07-03 DIAGNOSIS — D071 Carcinoma in situ of vulva: Secondary | ICD-10-CM | POA: Insufficient documentation

## 2012-07-03 DIAGNOSIS — Z888 Allergy status to other drugs, medicaments and biological substances status: Secondary | ICD-10-CM | POA: Insufficient documentation

## 2012-07-03 DIAGNOSIS — Z8673 Personal history of transient ischemic attack (TIA), and cerebral infarction without residual deficits: Secondary | ICD-10-CM | POA: Insufficient documentation

## 2012-07-03 DIAGNOSIS — J45909 Unspecified asthma, uncomplicated: Secondary | ICD-10-CM | POA: Insufficient documentation

## 2012-07-03 DIAGNOSIS — Z86718 Personal history of other venous thrombosis and embolism: Secondary | ICD-10-CM | POA: Insufficient documentation

## 2012-07-03 DIAGNOSIS — E785 Hyperlipidemia, unspecified: Secondary | ICD-10-CM | POA: Insufficient documentation

## 2012-07-03 DIAGNOSIS — Z86711 Personal history of pulmonary embolism: Secondary | ICD-10-CM | POA: Insufficient documentation

## 2012-07-03 DIAGNOSIS — E669 Obesity, unspecified: Secondary | ICD-10-CM | POA: Insufficient documentation

## 2012-07-03 DIAGNOSIS — Z809 Family history of malignant neoplasm, unspecified: Secondary | ICD-10-CM | POA: Insufficient documentation

## 2012-07-03 DIAGNOSIS — M1990 Unspecified osteoarthritis, unspecified site: Secondary | ICD-10-CM | POA: Insufficient documentation

## 2012-07-03 DIAGNOSIS — Z881 Allergy status to other antibiotic agents status: Secondary | ICD-10-CM | POA: Insufficient documentation

## 2012-07-03 DIAGNOSIS — N289 Disorder of kidney and ureter, unspecified: Secondary | ICD-10-CM | POA: Insufficient documentation

## 2012-07-03 DIAGNOSIS — K279 Peptic ulcer, site unspecified, unspecified as acute or chronic, without hemorrhage or perforation: Secondary | ICD-10-CM | POA: Insufficient documentation

## 2012-07-03 DIAGNOSIS — Z8249 Family history of ischemic heart disease and other diseases of the circulatory system: Secondary | ICD-10-CM | POA: Insufficient documentation

## 2012-07-03 DIAGNOSIS — Z87440 Personal history of urinary (tract) infections: Secondary | ICD-10-CM | POA: Insufficient documentation

## 2012-07-03 DIAGNOSIS — Z882 Allergy status to sulfonamides status: Secondary | ICD-10-CM | POA: Insufficient documentation

## 2012-07-03 MED ORDER — PRAVASTATIN SODIUM 40 MG PO TABS: 40.0000 mg | ORAL_TABLET | Freq: Every day | ORAL | Status: DC

## 2012-07-03 MED ORDER — CLORAZEPATE DIPOTASSIUM 7.5 MG PO TABS: 7.5000 mg | ORAL_TABLET | Freq: Three times a day (TID) | ORAL | Status: DC

## 2012-07-03 NOTE — Patient Instructions (Signed)
Stop Xarelto after Saturday dose.

## 2012-07-03 NOTE — Progress Notes (Signed)
Consult Note: Gyn-Onc  [name] [name] 75 y.o. female  CC:  Chief Complaint  Patient presents with  . VIN III    Follow up    HPI: Patient is a 75-year-old gravida 5 para 4 aborta 1 who approximately 6 months ago began experiencing some dysuria. She was diagnosed with 3 urinary tract infections and went to see a urologist. The neurologist felt that she might need to see infectious disease specialists referred her back to Dr. Nadel her primary care physician. Dr. Nadel. felt to be most appropriate for her to see a gynecologist she was referred to Dr. Mody. On exam Dr. 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 There is no evidence of any invasive malignancy. It is for this reason that she is referred to us.  Interval History:  I initially saw her June 11. At that time she was seen to arrange care issues for her elderly mother it cannot schedule surgery right away. At this point she is scheduled for surgery on July 30 2 comes in today to make sure that there is no additional lesions were changes. We did discuss her cardiac issues with both Dr. Taylor and her hematologic issues with Dr. Ennever. Dr. Taylor and Dr. Ennever both feel that she can come off her anticoagulation around the time of surgery. She'll come off her Xarelto 2 days prior to her surgery. There's been no interval changes per her report or history.   Review of Systems  She denies any chest pain, nausea, shortness of breath. She has had some episodes of rectal bleeding felt to be secondary to external hemorrhoids. Her dysuria is better. She is no bleeding from his ulcerative lesion. She does have multiple medical problems. She has refused to be on any Coumadin as she states she had a stroke in 2006 secondary to Coumadin. She is currently on Xarelto as she did not want to be on the subcutaneous Arixtra. She is  under the care of Dr. Peter Ennever for this. She denies any unintentional weight loss weight gain. She helps care for her 96-year-old mother. She denies any unintentional weight loss weight gain. She denies any abdominal or pelvic pain change in her bowel or bladder habits. She denies any lower extremity edema. 10 point review of systems is otherwise negative. We discussed coming off her anticoagulation 2 days prior to the surgery.  Her daughter is available and able to care for her 96-year-old mother.   Current Meds:  Outpatient Encounter Prescriptions as of 07/03/2012  Medication Sig Dispense Refill  . cetirizine (ZYRTEC) 10 MG tablet Take 10 mg by mouth daily.        . chlorpheniramine-HYDROcodone (TUSSIONEX PENNKINETIC ER) 10-8 MG/5ML LQCR Take 5 mLs by mouth every 12 (twelve) hours.  140 mL  5  . Cholecalciferol (VITAMIN D) 1000 UNITS capsule Take 1,000 Units by mouth daily.        . clobetasol ointment (TEMOVATE) 0.05 % every morning.      . clorazepate (TRANXENE) 7.5 MG tablet Take 1 tablet (7.5 mg total) by mouth 3 (three) times daily. As needed for nerves  270 tablet  3  . Diphenhyd-Hydrocort-Nystatin (FIRST-DUKES MOUTHWASH) SUSP Take 5 mLs by mouth 4 (four) times daily as needed. gargle and swallow up to 4 times daily as needed for sore throat      . FLOVENT HFA 44 MCG/ACT inhaler INHALE 2 PUFFS 2   TIMES A DAY  10.6 g  5  . folic acid (FOLVITE) 1 MG tablet Take 1 mg by mouth daily.        . furosemide (LASIX) 20 MG tablet Take 2 tablets by mouth every morning  180 tablet  3  . ipratropium-albuterol (DUONEB) 0.5-2.5 (3) MG/3ML SOLN Take 3 mLs by nebulization 2 (two) times daily.        . metoprolol succinate (TOPROL-XL) 25 MG 24 hr tablet Take 1 tablet (25 mg total) by mouth daily.  90 tablet  3  . mometasone (NASONEX) 50 MCG/ACT nasal spray Place 2 sprays into the nose daily as needed. For congestion  51 g  3  . Multiple Vitamins-Minerals (CENTRUM SILVER PO) Take 1 tablet by mouth daily.         . nystatin-triamcinolone (MYCOLOG II) cream 1 day or 1 dose.      . omeprazole (PRILOSEC) 40 MG capsule Take 1 capsule (40 mg total) by mouth 2 (two) times daily.  180 capsule  3  . Polyethyl Glycol-Propyl Glycol (SYSTANE) 0.4-0.3 % SOLN Apply 2 drops to eye daily.       . pravastatin (PRAVACHOL) 40 MG tablet TAKE 1 TABLET BY MOUTH EVERY DAY  90 tablet  2  . sennosides-docusate sodium (SENOKOT-S) 8.6-50 MG tablet Take 2 tablets by mouth at bedtime.        . silver sulfADIAZINE (SILVADENE) 1 % cream Nightly.      . XARELTO 20 MG TABS TAKE 1 TABLET BY MOUTH EVERY DAY  30 tablet  2    Allergy:  Allergies  Allergen Reactions  . Sulfa Antibiotics Other (See Comments)    Doesn't like to drink lots of water.  . Sulfonamide Derivatives     REACTION: nausea  . Sumatriptan Other (See Comments)    Reaction unknown    Social Hx:   History   Social History  . Marital Status: Widowed    Spouse Name: N/A    Number of Children: 4  . Years of Education: N/A   Occupational History  . retired    Social History Main Topics  . Smoking status: Never Smoker   . Smokeless tobacco: Not on file  . Alcohol Use: No  . Drug Use: Not on file  . Sexually Active: Not on file   Other Topics Concern  . Not on file   Social History Narrative  . No narrative on file    Past Surgical Hx:  Past Surgical History  Procedure Date  . Appendectomy   . Cholecystectomy 01/1996    Dr. Blievernicht  . Abdominal hysterectomy   . Lumbar disc surgery 1990  . Left knee arthroscopy   . Ivc filter placed 07/2010    Past Medical Hx:  Past Medical History  Diagnosis Date  . Allergic rhinitis   . OSA (obstructive sleep apnea)   . Sarcoidosis   . Pulmonary hypertension   . Asthmatic bronchitis   . Pulmonary embolism   . Right leg DVT   . Hypertension   . CAD (coronary artery disease)   . Syncope   . Cardiac pacemaker in situ   . Venous insufficiency   . Hyperlipidemia   . Obesity   . PUD  (peptic ulcer disease)   . Hemorrhoids   . Renal insufficiency   . UTI (lower urinary tract infection)   . DJD (degenerative joint disease)   . Cervicalgia   . Lumbar back pain   . Stroke   .   Anxiety     Family Hx:  Family History  Problem Relation Age of Onset  . Heart attack Father   . Cancer Sister   . Heart attack Brother     Vitals:  Blood pressure 142/80, pulse 78, temperature 98.3 F (36.8 C), temperature source Oral, resp. rate 16, height 5' 4.09" (1.628 m), weight 177 lb 4.8 oz (80.423 kg).  Physical Exam: Well-nourished well-developed female who appears stated age in no acute distress.  Neck: Supple, no lymphadenopathy no thyromegaly.  Lungs: Clear to auscultation bilaterally.  Cardiovascular: Regular rate and rhythm.  Abdomen: Soft, nontender, nondistended. His well-healed vertical midline incision. There is no incisional hernia.  Groins: No lymphadenopathy.  Pelvic: External genitalia erythematous ulcerative plaque lesion involving the inferior aspect of the right labia majora extending just to the vagina. The lesion measures approximately 3 x 3 cm.  It extends slightly into the vagina on the right side. She is a very narrow perineal body. There is no mass palpable. It appears superficial.    Assessment/Plan:  75-year-old with VIN 3.  We have had lengthy discussion regarding options including laser ablation versus excision. She would very much like to have an excision. This has been cleared by both her cardiologist Dr. Taylor and that she can have surgery as well as Dr. Ennever and that she can come off of her Xarelto and can stop it 2 days prior to surgery. She is scheduled for surgery on July 30 she'll have her preoperative visit. Risks and benefits of surgery were discussed with the patient. She understands that final pathology may reveal an area of invasive cancer that may dictate other therapies. Risks including superficial breakdown, need for additional procedures,  injury to surrounding organs were discussed with the patient. Her questions were elicited and answered to her satisfaction. We did go over her postoperative wound care with her today. She does have a hand-held shower had that she can use for perineal care.      Marchele Decock A., MD 07/03/2012, 3:49 PM   

## 2012-07-03 NOTE — Telephone Encounter (Signed)
Faxed medication refill request received for patient from [facility]. Medication requested: Clorazepate tabs 7.5mg     #270 x 3 refills  Patient last seen 05/22/2012 by Dr. Kriste Basque --asked to follow up in 6 mo and appt scheduled for 11/20/12 Medication last refilled on 01/17/2012 with #270 x 3 refills  Dr. Kriste Basque, please advise as to refill medication, Thank you!

## 2012-07-04 NOTE — Telephone Encounter (Signed)
rx has been signed by SN and faxed back to [facility].  Nothing further needed.

## 2012-07-04 NOTE — Patient Instructions (Addendum)
[name]  07/04/2012   Your procedure is scheduled on:  07-16-12 at 1:45 PM  Report to SHORT STAY DEPT  at 11:15 AM.  Call this number if you have problems the morning of surgery: [phone]   Remember:   Do not eat food or drink liquids AFTER MIDNIGHT  May have clear liquids UNTIL 6 HOURS BEFORE SURGERY (7:45 AM)  Clear liquids include soda, tea, black coffee, apple or grape juice, broth.  Take these medicines the morning of surgery with A SIP OF WATER: CLORAZEPATE / METOPROLOL / OMEPRAZOLE / ZYRTEC / USE FLOVENT INHALER / USE DUBONEB NEBULIZER   Do not wear jewelry, make-up or nail polish.  Do not wear lotions, powders, or perfumes.   Do not shave legs or underarms 48 hrs. before surgery (men may shave face)  Do not bring valuables to the hospital.  Contacts, dentures or bridgework may not be worn into surgery.  Leave suitcase in the car. After surgery it may be brought to your room.  For patients admitted to the hospital, checkout time is 11:00 AM the day of discharge.   Patients discharged the day of surgery will not be allowed to drive home.    Special Instructions:   Please read over the following fact sheets that you were given: MRSA  Information / Incentive Spirometer               SHOWER WITH BETASEPT THE NIGHT BEFORE SURGERY AND THE MORNING OF SURGERY                   BRING NASAL MASK TO HOSPITAL

## 2012-07-05 ENCOUNTER — Encounter (HOSPITAL_COMMUNITY): Payer: Self-pay

## 2012-07-05 ENCOUNTER — Encounter (HOSPITAL_COMMUNITY)
Admission: RE | Admit: 2012-07-05 | Discharge: 2012-07-05 | Disposition: A | Discharge status: Home / Self Care | Payer: Medicare Other | Source: Ambulatory Visit | Attending: Obstetrics & Gynecology | Admitting: Obstetrics & Gynecology

## 2012-07-05 VITALS — BP 156/90 | HR 84 | Temp 98.3°F | Resp 18 | Ht 64.0 in | Wt 169.0 lb

## 2012-07-05 DIAGNOSIS — D071 Carcinoma in situ of vulva: Secondary | ICD-10-CM

## 2012-07-05 HISTORY — DX: Shortness of breath: R06.02

## 2012-07-05 HISTORY — DX: Gastro-esophageal reflux disease without esophagitis: K21.9

## 2012-07-05 LAB — DIFFERENTIAL
Basophils Absolute: 0 10*3/uL (ref 0.0–0.1)
Basophils Relative: 1 % (ref 0–1)
Eosinophils Absolute: 0.1 10*3/uL (ref 0.0–0.7)
Eosinophils Relative: 2 % (ref 0–5)
Lymphocytes Relative: 20 % (ref 12–46)
Monocytes Absolute: 0.7 10*3/uL (ref 0.1–1.0)

## 2012-07-05 LAB — SURGICAL PCR SCREEN
MRSA, PCR: NEGATIVE
Staphylococcus aureus: NEGATIVE

## 2012-07-05 LAB — CBC
MCHC: 32.3 g/dL (ref 30.0–36.0)
MCV: 90.1 fL (ref 78.0–100.0)
Platelets: 192 10*3/uL (ref 150–400)
RDW: 13.4 % (ref 11.5–15.5)
WBC: 6.2 10*3/uL (ref 4.0–10.5)

## 2012-07-05 LAB — BASIC METABOLIC PANEL
BUN: 19 mg/dL (ref 6–23)
CO2: 31 mEq/L (ref 19–32)
Calcium: 9.9 mg/dL (ref 8.4–10.5)
Creatinine, Ser: 0.88 mg/dL (ref 0.50–1.10)

## 2012-07-09 ENCOUNTER — Telehealth: Payer: Self-pay | Admitting: Hematology & Oncology

## 2012-07-09 NOTE — Telephone Encounter (Signed)
Pt cx 8-1 is having surgery 7-31. Rescheduled for 8-20

## 2012-07-16 ENCOUNTER — Encounter (HOSPITAL_COMMUNITY): Payer: Self-pay | Admitting: Certified Registered Nurse Anesthetist

## 2012-07-16 ENCOUNTER — Ambulatory Visit (HOSPITAL_COMMUNITY): Payer: Medicare Other | Admitting: Certified Registered Nurse Anesthetist

## 2012-07-16 ENCOUNTER — Ambulatory Visit (HOSPITAL_COMMUNITY)
Admission: RE | Admit: 2012-07-16 | Discharge: 2012-07-16 | Disposition: A | Discharge status: Home / Self Care | Payer: Medicare Other | Source: Ambulatory Visit | Attending: Obstetrics & Gynecology | Admitting: Obstetrics & Gynecology

## 2012-07-16 ENCOUNTER — Encounter (HOSPITAL_COMMUNITY)
Admission: RE | Disposition: A | Discharge status: Home / Self Care | Payer: Self-pay | Source: Ambulatory Visit | Attending: Obstetrics & Gynecology

## 2012-07-16 ENCOUNTER — Encounter (HOSPITAL_COMMUNITY): Payer: Self-pay | Admitting: *Deleted

## 2012-07-16 DIAGNOSIS — Z86718 Personal history of other venous thrombosis and embolism: Secondary | ICD-10-CM | POA: Insufficient documentation

## 2012-07-16 DIAGNOSIS — D071 Carcinoma in situ of vulva: Secondary | ICD-10-CM | POA: Insufficient documentation

## 2012-07-16 DIAGNOSIS — Z8673 Personal history of transient ischemic attack (TIA), and cerebral infarction without residual deficits: Secondary | ICD-10-CM | POA: Insufficient documentation

## 2012-07-16 DIAGNOSIS — Z950 Presence of cardiac pacemaker: Secondary | ICD-10-CM | POA: Insufficient documentation

## 2012-07-16 DIAGNOSIS — Z01812 Encounter for preprocedural laboratory examination: Secondary | ICD-10-CM | POA: Insufficient documentation

## 2012-07-16 DIAGNOSIS — G4733 Obstructive sleep apnea (adult) (pediatric): Secondary | ICD-10-CM | POA: Insufficient documentation

## 2012-07-16 DIAGNOSIS — Z79899 Other long term (current) drug therapy: Secondary | ICD-10-CM | POA: Insufficient documentation

## 2012-07-16 DIAGNOSIS — I2510 Atherosclerotic heart disease of native coronary artery without angina pectoris: Secondary | ICD-10-CM | POA: Insufficient documentation

## 2012-07-16 DIAGNOSIS — I2789 Other specified pulmonary heart diseases: Secondary | ICD-10-CM | POA: Insufficient documentation

## 2012-07-16 DIAGNOSIS — D869 Sarcoidosis, unspecified: Secondary | ICD-10-CM | POA: Insufficient documentation

## 2012-07-16 HISTORY — PX: VULVAR LESION REMOVAL: SHX5391

## 2012-07-16 SURGERY — VULVAR LESION
Anesthesia: Monitor Anesthesia Care | Laterality: Right

## 2012-07-16 MED ORDER — KETAMINE HCL 10 MG/ML IJ SOLN
INTRAMUSCULAR | Status: DC | PRN
  Administered 2012-07-16: 25 mg via INTRAVENOUS

## 2012-07-16 MED ORDER — FENTANYL CITRATE 0.05 MG/ML IJ SOLN
INTRAMUSCULAR | Status: DC | PRN
  Administered 2012-07-16 (×2): 50 ug via INTRAVENOUS

## 2012-07-16 MED ORDER — ACETAMINOPHEN 10 MG/ML IV SOLN
INTRAVENOUS | Status: DC | PRN
  Administered 2012-07-16: 1000 mg via INTRAVENOUS

## 2012-07-16 MED ORDER — MIDAZOLAM HCL 5 MG/5ML IJ SOLN
INTRAMUSCULAR | Status: DC | PRN
  Administered 2012-07-16: 2 mg via INTRAVENOUS

## 2012-07-16 MED ORDER — MEPERIDINE HCL 50 MG/ML IJ SOLN: 6.2500 mg | INTRAMUSCULAR | Status: DC | PRN

## 2012-07-16 MED ORDER — FENTANYL CITRATE 0.05 MG/ML IJ SOLN: 25.0000 ug | INTRAMUSCULAR | Status: DC | PRN

## 2012-07-16 MED ORDER — LACTATED RINGERS IV SOLN
INTRAVENOUS | Status: DC
  Administered 2012-07-16: 13:00:00 via INTRAVENOUS

## 2012-07-16 MED ORDER — BUPIVACAINE HCL (PF) 0.25 % IJ SOLN
INTRAMUSCULAR | Status: AC
  Filled 2012-07-16: qty 30

## 2012-07-16 MED ORDER — PROMETHAZINE HCL 25 MG/ML IJ SOLN: 6.2500 mg | INTRAMUSCULAR | Status: DC | PRN

## 2012-07-16 MED ORDER — ONDANSETRON HCL 4 MG/2ML IJ SOLN
INTRAMUSCULAR | Status: DC | PRN
  Administered 2012-07-16: 4 mg via INTRAVENOUS

## 2012-07-16 MED ORDER — LIDOCAINE HCL (CARDIAC) 20 MG/ML IV SOLN
INTRAVENOUS | Status: DC | PRN
  Administered 2012-07-16: 100 mg via INTRAVENOUS

## 2012-07-16 MED ORDER — LACTATED RINGERS IV SOLN: INTRAVENOUS | Status: DC

## 2012-07-16 MED ORDER — ACETIC ACID 5 % SOLN
Status: AC
  Filled 2012-07-16: qty 500

## 2012-07-16 MED ORDER — ACETIC ACID 5 % SOLN
Status: DC | PRN
  Administered 2012-07-16: 1 via TOPICAL

## 2012-07-16 MED ORDER — PROPOFOL 10 MG/ML IV EMUL
INTRAVENOUS | Status: DC | PRN
  Administered 2012-07-16: 120 ug/kg/min via INTRAVENOUS

## 2012-07-16 MED ORDER — BUPIVACAINE HCL (PF) 0.25 % IJ SOLN
INTRAMUSCULAR | Status: DC | PRN
  Administered 2012-07-16: 14 mL

## 2012-07-16 MED ORDER — ACETAMINOPHEN 10 MG/ML IV SOLN
INTRAVENOUS | Status: AC
  Filled 2012-07-16: qty 100

## 2012-07-16 SURGICAL SUPPLY — 24 items
BLADE SURG 15 STRL LF DISP TIS (BLADE) ×1 IMPLANT
BLADE SURG 15 STRL SS (BLADE) ×1
CANISTER SUCTION 2500CC (MISCELLANEOUS) ×2 IMPLANT
CATH ROBINSON RED A/P 16FR (CATHETERS) ×2 IMPLANT
CLOTH BEACON ORANGE TIMEOUT ST (SAFETY) ×2 IMPLANT
DRAPE SURG IRRIG POUCH 19X23 (DRAPES) ×2 IMPLANT
GLOVE BIO SURGEON STRL SZ 6.5 (GLOVE) ×2 IMPLANT
GLOVE BIOGEL M STRL SZ7.5 (GLOVE) ×2 IMPLANT
GLOVE BIOGEL PI IND STRL 7.0 (GLOVE) ×1 IMPLANT
GLOVE BIOGEL PI INDICATOR 7.0 (GLOVE) ×1
NEEDLE HYPO 25X1 1.5 SAFETY (NEEDLE) ×2 IMPLANT
NS IRRIG 1000ML POUR BTL (IV SOLUTION) ×2 IMPLANT
PACK ICE MAXI GEL EZY WRAP (MISCELLANEOUS) ×12 IMPLANT
PACK MINOR VAGINAL W LONG (CUSTOM PROCEDURE TRAY) ×2 IMPLANT
PENCIL BUTTON HOLSTER BLD 10FT (ELECTRODE) ×2 IMPLANT
SPONGE LAP 18X18 X RAY DECT (DISPOSABLE) IMPLANT
SUT VIC AB 2-0 SH 27 (SUTURE) ×1
SUT VIC AB 2-0 SH 27X BRD (SUTURE) ×1 IMPLANT
SUT VIC AB 3-0 PS2 18 (SUTURE) ×2
SUT VIC AB 3-0 PS2 18XBRD (SUTURE) ×2 IMPLANT
SYR CONTROL 10ML LL (SYRINGE) ×2 IMPLANT
TOWEL OR 17X26 10 PK STRL BLUE (TOWEL DISPOSABLE) ×2 IMPLANT
WATER STERILE IRR 1500ML POUR (IV SOLUTION) ×2 IMPLANT
YANKAUER SUCT BULB TIP 10FT TU (MISCELLANEOUS) ×2 IMPLANT

## 2012-07-16 NOTE — Anesthesia Postprocedure Evaluation (Signed)
  Anesthesia Post-op Note  Patient: [name] [name]  Procedure(s) Performed: Procedure(s) (LRB): VULVAR LESION (Right)  Patient Location: PACU  Anesthesia Type: MAC  Level of Consciousness: awake and alert   Airway and Oxygen Therapy: Patient Spontanous Breathing  Post-op Pain: mild  Post-op Assessment: Post-op Vital signs reviewed, Patient's Cardiovascular Status Stable, Respiratory Function Stable, Patent Airway and No signs of Nausea or vomiting  Post-op Vital Signs: stable  Complications: No apparent anesthesia complications

## 2012-07-16 NOTE — Transfer of Care (Signed)
Immediate Anesthesia Transfer of Care Note  Patient: [name] [name]  Procedure(s) Performed: Procedure(s) (LRB): VULVAR LESION (Right)  Patient Location: PACU  Anesthesia Type: MAC  Level of Consciousness: sedated, patient cooperative and responds to stimulaton  Airway & Oxygen Therapy: Patient Spontanous Breathing and Patient connected to face mask oxgen  Post-op Assessment: Report given to PACU RN and Post -op Vital signs reviewed and stable  Post vital signs: Reviewed and stable  Complications: No apparent anesthesia complications

## 2012-07-16 NOTE — Preoperative (Signed)
Beta Blockers   Reason not to administer Beta Blockers:Not Applicable Pt took Beta Blocker 07-16-12 AM

## 2012-07-16 NOTE — Progress Notes (Signed)
Peri bottle given to pt for peri care. Daughter and pt instructed to use peri bottle or hand held nozzle in shower after each time to bathroom. Pt to use peripads with ice pack for next 24 hours. To resume anticoagulant 07/18/12 evening. Teach back performed.  Pt had excision of vulvar lesion today.

## 2012-07-16 NOTE — Interval H&P Note (Signed)
History and Physical Interval Note:  07/16/2012 12:34 PM  [name] [name]  has presented today for surgery, with the diagnosis of Vulvar Lesion  The various methods of treatment have been discussed with the patient and family. After consideration of risks, benefits and other options for treatment, the patient has consented to  Procedure(s) (LRB): VULVAR LESION (N/A) as a surgical intervention .  The patient's history has been reviewed, patient examined, no change in status, stable for surgery.  I have reviewed the patient's chart and labs.  Questions were answered to the patient's satisfaction.     [name] [name] A.

## 2012-07-16 NOTE — H&P (View-Only) (Signed)
Consult Note: Gyn-Onc  [name] [name] 76 y.o. female  CC:  Chief Complaint  Patient presents with  . VIN III    Follow up    HPI: Patient is a 76 year old gravida 5 para 4 aborta 1 who approximately 6 months ago began experiencing some dysuria. She was diagnosed with 3 urinary tract infections and went to see a urologist. The neurologist felt that she might need to see infectious disease specialists referred her back to Dr. Kriste Basque her primary care physician. Dr. Kriste Basque. felt to be most appropriate for her to see a gynecologist she was referred to Dr. Juliene Pina. On exam Dr. 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 There is no evidence of any invasive malignancy. It is for this reason that she is referred to [place].  Interval History:  I initially saw her June 11. At that time she was seen to arrange care issues for her elderly mother it cannot schedule surgery right away. At this point she is scheduled for surgery on July 30 2 comes in today to make sure that there is no additional lesions were changes. We did discuss her cardiac issues with both Dr. Ladona Ridgel and her hematologic issues with Dr. Myna Hidalgo. Dr. Ladona Ridgel and Dr. Myna Hidalgo both feel that she can come off her anticoagulation around the time of surgery. She'll come off her Xarelto 2 days prior to her surgery. There's been no interval changes per her report or history.   Review of Systems  She denies any chest pain, nausea, shortness of breath. She has had some episodes of rectal bleeding felt to be secondary to external hemorrhoids. Her dysuria is better. She is no bleeding from his ulcerative lesion. She does have multiple medical problems. She has refused to be on any Coumadin as she states she had a stroke in 2006 secondary to Coumadin. She is currently on Xarelto as she did not want to be on the subcutaneous Arixtra. She is  under the care of Dr. Arlan Organ for this. She denies any unintentional weight loss weight gain. She helps care for her 62 year old mother. She denies any unintentional weight loss weight gain. She denies any abdominal or pelvic pain change in her bowel or bladder habits. She denies any lower extremity edema. 10 point review of systems is otherwise negative. We discussed coming off her anticoagulation 2 days prior to the surgery.  Her daughter is available and able to care for her 81 year old mother.   Current Meds:  Outpatient Encounter Prescriptions as of 07/03/2012  Medication Sig Dispense Refill  . cetirizine (ZYRTEC) 10 MG tablet Take 10 mg by mouth daily.        . chlorpheniramine-HYDROcodone (TUSSIONEX PENNKINETIC ER) 10-8 MG/5ML LQCR Take 5 mLs by mouth every 12 (twelve) hours.  140 mL  5  . Cholecalciferol (VITAMIN D) 1000 UNITS capsule Take 1,000 Units by mouth daily.        . clobetasol ointment (TEMOVATE) 0.05 % every morning.      . clorazepate (TRANXENE) 7.5 MG tablet Take 1 tablet (7.5 mg total) by mouth 3 (three) times daily. As needed for nerves  270 tablet  3  . Diphenhyd-Hydrocort-Nystatin (FIRST-DUKES MOUTHWASH) SUSP Take 5 mLs by mouth 4 (four) times daily as needed. gargle and swallow up to 4 times daily as needed for sore throat      . FLOVENT HFA 44 MCG/ACT inhaler INHALE 2 PUFFS 2  TIMES A DAY  10.6 g  5  . folic acid (FOLVITE) 1 MG tablet Take 1 mg by mouth daily.        . furosemide (LASIX) 20 MG tablet Take 2 tablets by mouth every morning  180 tablet  3  . ipratropium-albuterol (DUONEB) 0.5-2.5 (3) MG/3ML SOLN Take 3 mLs by nebulization 2 (two) times daily.        . metoprolol succinate (TOPROL-XL) 25 MG 24 hr tablet Take 1 tablet (25 mg total) by mouth daily.  90 tablet  3  . mometasone (NASONEX) 50 MCG/ACT nasal spray Place 2 sprays into the nose daily as needed. For congestion  51 g  3  . Multiple Vitamins-Minerals (CENTRUM SILVER PO) Take 1 tablet by mouth daily.         [name] Kitchen nystatin-triamcinolone (MYCOLOG II) cream 1 day or 1 dose.      [name] Kitchen omeprazole (PRILOSEC) 40 MG capsule Take 1 capsule (40 mg total) by mouth 2 (two) times daily.  180 capsule  3  . Polyethyl Glycol-Propyl Glycol (SYSTANE) 0.4-0.3 % SOLN Apply 2 drops to eye daily.       . pravastatin (PRAVACHOL) 40 MG tablet TAKE 1 TABLET BY MOUTH EVERY DAY  90 tablet  2  . sennosides-docusate sodium (SENOKOT-S) 8.6-50 MG tablet Take 2 tablets by mouth at bedtime.        . silver sulfADIAZINE (SILVADENE) 1 % cream Nightly.      [name] 20 MG TABS TAKE 1 TABLET BY MOUTH EVERY DAY  30 tablet  2    Allergy:  Allergies  Allergen Reactions  . Sulfa Antibiotics Other (See Comments)    Doesn't like to drink lots of water.  . Sulfonamide Derivatives     REACTION: nausea  . Sumatriptan Other (See Comments)    Reaction unknown    Social Hx:   History   Social History  . Marital Status: Widowed    Spouse Name: N/A    Number of Children: 4  . Years of Education: N/A   Occupational History  . retired    Social History Main Topics  . Smoking status: Never Smoker   . Smokeless tobacco: Not on file  . Alcohol Use: No  . Drug Use: Not on file  . Sexually Active: Not on file   Other Topics Concern  . Not on file   Social History Narrative  . No narrative on file    Past Surgical Hx:  Past Surgical History  Procedure Date  . Appendectomy   . Cholecystectomy 01/1996    Dr. Woodfin Ganja  . Abdominal hysterectomy   . Lumbar disc surgery 1990  . Left knee arthroscopy   . Ivc filter placed 07/2010    Past Medical Hx:  Past Medical History  Diagnosis Date  . Allergic rhinitis   . OSA (obstructive sleep apnea)   . Sarcoidosis   . Pulmonary hypertension   . Asthmatic bronchitis   . Pulmonary embolism   . Right leg DVT   . Hypertension   . CAD (coronary artery disease)   . Syncope   . Cardiac pacemaker in situ   . Venous insufficiency   . Hyperlipidemia   . Obesity   . PUD  (peptic ulcer disease)   . Hemorrhoids   . Renal insufficiency   . UTI (lower urinary tract infection)   . DJD (degenerative joint disease)   . Cervicalgia   . Lumbar back pain   . Stroke   .  Anxiety     Family Hx:  Family History  Problem Relation Age of Onset  . Heart attack Father   . Cancer Sister   . Heart attack Brother     Vitals:  Blood pressure 142/80, pulse 78, temperature 98.3 F (36.8 C), temperature source Oral, resp. rate 16, height 5' 4.09" (1.628 m), weight 177 lb 4.8 oz (80.423 kg).  Physical Exam: Well-nourished well-developed female who appears stated age in no acute distress.  Neck: Supple, no lymphadenopathy no thyromegaly.  Lungs: Clear to auscultation bilaterally.  Cardiovascular: Regular rate and rhythm.  Abdomen: Soft, nontender, nondistended. His well-healed vertical midline incision. There is no incisional hernia.  Groins: No lymphadenopathy.  Pelvic: External genitalia erythematous ulcerative plaque lesion involving the inferior aspect of the right labia majora extending just to the vagina. The lesion measures approximately 3 x 3 cm.  It extends slightly into the vagina on the right side. She is a very narrow perineal body. There is no mass palpable. It appears superficial.    Assessment/Plan:  77 year old with VIN 3.  We have had lengthy discussion regarding options including laser ablation versus excision. She would very much like to have an excision. This has been cleared by both her cardiologist Dr. Ladona Ridgel and that she can have surgery as well as Dr. Myna Hidalgo and that she can come off of her Xarelto and can stop it 2 days prior to surgery. She is scheduled for surgery on July 30 she'll have her preoperative visit. Risks and benefits of surgery were discussed with the patient. She understands that final pathology may reveal an area of invasive cancer that may dictate other therapies. Risks including superficial breakdown, need for additional procedures,  injury to surrounding organs were discussed with the patient. Her questions were elicited and answered to her satisfaction. We did go over her postoperative wound care with her today. She does have a hand-held shower had that she can use for perineal care.      Cleda Mccreedy A., MD 07/03/2012, 3:49 PM

## 2012-07-16 NOTE — Op Note (Signed)
PATIENT: [name] [name] DATE OF BIRTH: 1936-06-27 ENCOUNTER DATE: 07/16/2012   Preop Diagnosis: VIN 3  Postoperative Diagnosis: same.   Surgery: Wide local excision vulva  Surgeons:  Rejeana Brock A. Duard Brady, MD  Assistant: Telford Nab   Anesthesia: MAC with 14 ml 0.25% marcaine local  Estimated blood loss: 5 ml   IVF: 600 ml   Urine output: 150 ml   Complications: None   Pathology: Right vulva corked with suture at 9:00  Operative findings: 3x4 cm lesion on right  Procedure: Wide local excision  Patient was identified in the holding area as self. Informed consent was signed on the chart.  She was taken to the operating room where conscious sedation was instilled. She was then placed in the dorsolithotomy position without rub or precautions. Timeout was performed to confirm the patient, procedure, antibiotic status. She was then prepped with Betadine and straight catheter of the bladder was performed with 50 mils of urine obtained.  She was then draped. 3 percent acetic acid was applied to the area of the vulvar lesion that measured 3 x 4 cm on the right. The area was well demarcated. 10 mls of quarter percent Marcaine was injected. An elliptical incision was made approximately 5 mm away from the lesion was performed an elliptical fashion. The vulvectomy was corked and marked at the 9:00 position. Hemostasis was obtained with pinpoint cautery. The incision was closed a longitudinal fashion along the skin fold of the labia majora. This was closed with interrupted sutures of 3-0 Vicryl. There was adequate hemostasis. An additional 4 mL support percent Marcaine was used for postoperative pain control.  The patient tolerated the procedure well.  All instrument needle and Ray-Tec counts were correct x2. The patient tolerated the procedure well and was taken to the recovery room in stable condition. This is [facility] dictating an operative note on patient [name] [name].

## 2012-07-16 NOTE — Anesthesia Preprocedure Evaluation (Addendum)
Anesthesia Evaluation    Airway Mallampati: II TM Distance: >3 FB Neck ROM: Full    Dental No notable dental hx.    Pulmonary asthma , sleep apnea , COPD COPD inhaler and oxygen dependent, PE sarcoidosis breath sounds clear to auscultation  Pulmonary exam normal       Cardiovascular hypertension, Pt. on medications + CAD, + Past MI and +CHF + pacemaker Rhythm:Regular Rate:Normal  pulm htn   Neuro/Psych PSYCHIATRIC DISORDERS Anxiety CVA    GI/Hepatic   Endo/Other    Renal/GU      Musculoskeletal   Abdominal   Peds  Hematology   Anesthesia Other Findings   Reproductive/Obstetrics                          Anesthesia Physical Anesthesia Plan  ASA: IV  Anesthesia Plan: General and MAC   Post-op Pain Management:    Induction: Intravenous  Airway Management Planned: Simple Face Mask  Additional Equipment:   Intra-op Plan:   Post-operative Plan:   Informed Consent: I have reviewed the patients History and Physical, chart, labs and discussed the procedure including the risks, benefits and alternatives for the proposed anesthesia with the patient or authorized representative who has indicated his/her understanding and acceptance.   Dental advisory given  Plan Discussed with: CRNA  Anesthesia Plan Comments:         Anesthesia Quick Evaluation

## 2012-07-17 ENCOUNTER — Encounter (HOSPITAL_COMMUNITY): Payer: Self-pay | Admitting: Gynecologic Oncology

## 2012-07-18 ENCOUNTER — Other Ambulatory Visit: Payer: Medicare Other | Admitting: Lab

## 2012-07-18 ENCOUNTER — Telehealth: Payer: Self-pay | Admitting: Gynecologic Oncology

## 2012-07-18 ENCOUNTER — Ambulatory Visit: Payer: Medicare Other | Admitting: Hematology & Oncology

## 2012-07-18 NOTE — Telephone Encounter (Signed)
Post op telephone call to check patient status.  Patient describes expected post operative status.  Adequate PO intake reported.  Bowels and bladder functioning without difficulty.  Pain minimal.  Reportable signs and symptoms reviewed.  Follow up appt given for August 07, 2012 with Dr. Duard Brady.  Final pathology reviewed also.

## 2012-08-06 ENCOUNTER — Other Ambulatory Visit: Payer: Medicare Other | Admitting: Lab

## 2012-08-06 ENCOUNTER — Ambulatory Visit (HOSPITAL_BASED_OUTPATIENT_CLINIC_OR_DEPARTMENT_OTHER): Payer: Medicare Other | Admitting: Hematology & Oncology

## 2012-08-06 VITALS — BP 145/79 | HR 91 | Temp 97.6°F | Resp 20 | Ht 64.0 in | Wt 176.0 lb

## 2012-08-06 DIAGNOSIS — Z7901 Long term (current) use of anticoagulants: Secondary | ICD-10-CM

## 2012-08-06 DIAGNOSIS — I82409 Acute embolism and thrombosis of unspecified deep veins of unspecified lower extremity: Secondary | ICD-10-CM

## 2012-08-06 LAB — CBC WITH DIFFERENTIAL (CANCER CENTER ONLY)
BASO#: 0 10*3/uL (ref 0.0–0.2)
HCT: 38.2 % (ref 34.8–46.6)
HGB: 12.5 g/dL (ref 11.6–15.9)
LYMPH#: 1.2 10*3/uL (ref 0.9–3.3)
MCHC: 32.7 g/dL (ref 32.0–36.0)
MONO#: 0.7 10*3/uL (ref 0.1–0.9)
NEUT%: 63.6 % (ref 39.6–80.0)
WBC: 5.6 10*3/uL (ref 3.9–10.0)

## 2012-08-06 NOTE — Progress Notes (Signed)
CC:   Lonzo Cloud. Kriste Basque, MD Bernita Buffy. Duard Brady, MD  DIAGNOSES: 1. Recurrent deep vein thrombosis of the right leg.  2.  Vulvar in     situ neoplasm-grade 3.  CURRENT THERAPY: 1. Xarelto 20 mg p.o. daily.  2.  Patient is status post wide local     excision of the vulvar lesion.  INTERIM HISTORY:  Ms. Gallion comes in for followup.  She underwent excision of this vulvar lesion on July 30th.  She did very well.  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  This was, I think, grade 3.  There were some areas that were involved in the margins.  This is being followed by Dr. Duard Brady.  She is feeling well.  She is recovering from this.  She has had no nausea or vomiting.  There has been no problems with bleeding.  She had no bleeding during the procedure.  She has had some leg swelling.  This is not unusual for Ms. Maruyama.  There has been no cough or shortness breath.  She has had no change in bowel or bladder habits.  PHYSICAL EXAMINATION:  General:  This is a well-developed, well- nourished, white female in no obvious distress.  Vital Signs: Temperature of 97.6, pulse 91, respiratory rate 20, blood pressure 145/79.  Weight is 176.  Head and Neck:  Normocephalic, atraumatic skull.  There are no ocular or oral lesions.  There are no palpable cervical or supraclavicular lymph nodes.  Lungs:  Clear bilaterally. Cardiac:  Regular rate and rhythm with a normal S1, S2.  There are no murmurs, rubs, or bruits.  Abdomen:  Soft with good bowel sounds.  There is no palpable abdominal mass.  She does have some collateral veins noted under the surface.  There is no palpable hepatosplenomegaly. Extremities:  No clubbing, cyanosis, or edema.  Neurological:  No focal neurological deficits.  LABORATORY STUDIES:  White cell count 5.6, hemoglobin 12.5, hematocrit 38.2, platelet count 195.  IMPRESSION:  [name] [name] is a 76 year old white female with recurrent deep vein thrombosis of the right leg.  She is  on long-term Xarelto.  I do not see any problems with respect to DVT recurrence.  I am glad she got through her surgery without any issues.  We will plan to get her back in 3 months' time now.  I do not think we need any blood work in between visits.    ______________________________ Josph Macho, M.D. PRE/MEDQ  D:  08/06/2012  T:  08/06/2012  Job:  [id number]

## 2012-08-06 NOTE — Progress Notes (Signed)
This office note has been dictated.

## 2012-08-07 ENCOUNTER — Ambulatory Visit: Payer: Medicare Other | Attending: Gynecologic Oncology | Admitting: Gynecologic Oncology

## 2012-08-07 ENCOUNTER — Encounter: Payer: Self-pay | Admitting: Gynecologic Oncology

## 2012-08-07 VITALS — BP 138/90 | HR 66 | Temp 97.9°F | Resp 16 | Ht 64.09 in | Wt 177.4 lb

## 2012-08-07 DIAGNOSIS — J4489 Other specified chronic obstructive pulmonary disease: Secondary | ICD-10-CM | POA: Insufficient documentation

## 2012-08-07 DIAGNOSIS — I252 Old myocardial infarction: Secondary | ICD-10-CM | POA: Insufficient documentation

## 2012-08-07 DIAGNOSIS — J449 Chronic obstructive pulmonary disease, unspecified: Secondary | ICD-10-CM | POA: Insufficient documentation

## 2012-08-07 DIAGNOSIS — Z86711 Personal history of pulmonary embolism: Secondary | ICD-10-CM | POA: Insufficient documentation

## 2012-08-07 DIAGNOSIS — Z8673 Personal history of transient ischemic attack (TIA), and cerebral infarction without residual deficits: Secondary | ICD-10-CM | POA: Insufficient documentation

## 2012-08-07 DIAGNOSIS — Z86718 Personal history of other venous thrombosis and embolism: Secondary | ICD-10-CM | POA: Insufficient documentation

## 2012-08-07 DIAGNOSIS — E669 Obesity, unspecified: Secondary | ICD-10-CM | POA: Insufficient documentation

## 2012-08-07 DIAGNOSIS — I10 Essential (primary) hypertension: Secondary | ICD-10-CM | POA: Insufficient documentation

## 2012-08-07 DIAGNOSIS — Z79899 Other long term (current) drug therapy: Secondary | ICD-10-CM | POA: Insufficient documentation

## 2012-08-07 DIAGNOSIS — E785 Hyperlipidemia, unspecified: Secondary | ICD-10-CM | POA: Insufficient documentation

## 2012-08-07 DIAGNOSIS — D071 Carcinoma in situ of vulva: Secondary | ICD-10-CM | POA: Insufficient documentation

## 2012-08-07 DIAGNOSIS — G4733 Obstructive sleep apnea (adult) (pediatric): Secondary | ICD-10-CM | POA: Insufficient documentation

## 2012-08-07 DIAGNOSIS — C001 Malignant neoplasm of external lower lip: Secondary | ICD-10-CM | POA: Insufficient documentation

## 2012-08-07 DIAGNOSIS — K219 Gastro-esophageal reflux disease without esophagitis: Secondary | ICD-10-CM | POA: Insufficient documentation

## 2012-08-07 NOTE — Patient Instructions (Signed)
RTC 3 months

## 2012-08-07 NOTE — Progress Notes (Signed)
Consult Note: Gyn-Onc  [name] [name] 76 y.o. female  CC:  Chief Complaint  Patient presents with  . VIN III    follow up    HPI: Patient is a 76 year old gravida 5 para 4 aborta 1 who approximately 6 months ago began experiencing some dysuria. She was diagnosed with 3 urinary tract infections and went to see a urologist. The neurologist felt that she might need to see infectious disease specialists referred her back to Dr. Kriste Basque her primary care physician. Dr. Kriste Basque. felt to be most appropriate for her to see a gynecologist she was referred to Dr. Juliene Pina. On exam Dr. 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 There is no evidence of any invasive malignancy. It is for this reason that she is referred to [place].   I initially saw her June 11. At that time she was seen to arrange care issues for her elderly mother it cannot schedule surgery right away. At this point she is scheduled for surgery on July 30 2 comes in today to make sure that there is no additional lesions were changes. We did discuss her cardiac issues with both Dr. Ladona Ridgel and her hematologic issues with Dr. Myna Hidalgo. Dr. Ladona Ridgel and Dr. Myna Hidalgo both feel that she can come off her anticoagulation around the time of surgery. She came off her Xarelto 2 days prior to her surgery. There's been no interval changes per her report or history.  Interval History:  On July 30 she underwent a wide local excision of the vulva. Operative findings included a 3 x 4 cm lesion that was well demarcated involving the right of the vulva and perineal body. She tolerated the procedure well. 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 She comes in today for her postoperative check. She was recently seen by Dr. Myna Hidalgo who felt that she could continue on her  [place]. She's done very well since her surgery. She's had no bleeding or discharge. She had no pain.  Review of Systems  Current Meds:  Outpatient Encounter Prescriptions as of 08/07/2012  Medication Sig Dispense Refill  . cetirizine (ZYRTEC) 10 MG tablet Take 10 mg by mouth daily.       . chlorpheniramine-HYDROcodone (TUSSIONEX PENNKINETIC ER) 10-8 MG/5ML LQCR Take 5 mLs by mouth every 12 (twelve) hours.  140 mL  5  . Cholecalciferol (VITAMIN D) 1000 UNITS capsule Take 1,000 Units by mouth daily.       . clorazepate (TRANXENE) 7.5 MG tablet Take 1 tablet (7.5 mg total) by mouth 3 (three) times daily. As needed for nerves  270 tablet  3  . Diphenhyd-Hydrocort-Nystatin (FIRST-DUKES MOUTHWASH) SUSP Take 5 mLs by mouth 4 (four) times daily as needed. gargle and swallow up to 4 times daily as needed for sore throat      . ESTRACE VAGINAL 0.1 MG/GM vaginal cream daily.       [name] Kitchen FLOVENT HFA 44 MCG/ACT inhaler INHALE 2 PUFFS 2 TIMES A DAY  10.6 g  5  . folic acid (FOLVITE) 1 MG tablet Take 1 mg by mouth daily.       . furosemide (LASIX) 20 MG tablet Take 2 tablets by mouth every morning  180 tablet  3  . HYDROcodone-acetaminophen (NORCO/VICODIN) 5-325 MG per tablet every 8 (eight) hours as needed.       [name] Kitchen ipratropium-albuterol (  DUONEB) 0.5-2.5 (3) MG/3ML SOLN Take 3 mLs by nebulization 2 (two) times daily.       . metoprolol succinate (TOPROL-XL) 25 MG 24 hr tablet Take 1 tablet (25 mg total) by mouth daily.  90 tablet  3  . mometasone (NASONEX) 50 MCG/ACT nasal spray Place 2 sprays into the nose daily as needed. For congestion  51 g  3  . Multiple Vitamins-Minerals (CENTRUM SILVER PO) Take 1 tablet by mouth daily.        [name] Kitchen omeprazole (PRILOSEC) 40 MG capsule Take 1 capsule (40 mg total) by mouth 2 (two) times daily.  180 capsule  3  . Polyethyl Glycol-Propyl Glycol (SYSTANE) 0.4-0.3 % SOLN Apply 2 drops to eye daily.       . pravastatin (PRAVACHOL) 40 MG tablet Take 1 tablet (40 mg total) by mouth  daily.  90 tablet  1  . sennosides-docusate sodium (SENOKOT-S) 8.6-50 MG tablet Take 2 tablets by mouth at bedtime.       . silver sulfADIAZINE (SILVADENE) 1 % cream Nightly.      [name] 20 MG TABS TAKE 1 TABLET BY MOUTH EVERY DAY  30 tablet  2    Allergy:  Allergies  Allergen Reactions  . Sulfa Antibiotics Other (See Comments)    Doesn't like to drink lots of water.  . Sulfonamide Derivatives     REACTION: nausea  . Sumatriptan Other (See Comments)    Reaction unknown    Social Hx:   History   Social History  . Marital Status: Widowed    Spouse Name: N/A    Number of Children: 4  . Years of Education: N/A   Occupational History  . retired    Social History Main Topics  . Smoking status: Never Smoker   . Smokeless tobacco: Not on file  . Alcohol Use: No  . Drug Use: No  . Sexually Active: Not on file   Other Topics Concern  . Not on file   Social History Narrative  . No narrative on file    Past Surgical Hx:  Past Surgical History  Procedure Date  . Appendectomy   . Cholecystectomy 01/1996    Dr. Woodfin Ganja  . Abdominal hysterectomy   . Lumbar disc surgery 1990    X2  . Left knee arthroscopy   . Ivc filter placed 07/2010  . Pacemaker insertion 2008  . Vulvar lesion removal 07/16/2012    Procedure: VULVAR LESION;  Surgeon: Rejeana Brock A. Duard Brady, MD;  Location: [facility];  Service: Gynecology;  Laterality: Right;  Wide Local Excision of the Vulvar    Past Medical Hx:  Past Medical History  Diagnosis Date  . Allergic rhinitis   . Sarcoidosis   . Pulmonary hypertension   . Asthmatic bronchitis   . Pulmonary embolism   . Right leg DVT   . Hypertension   . CAD (coronary artery disease)   . Cardiac pacemaker in situ   . Venous insufficiency   . Hyperlipidemia   . Obesity   . PUD (peptic ulcer disease)   . Hemorrhoids   . Renal insufficiency   . UTI (lower urinary tract infection)   . DJD (degenerative joint disease)   . Cervicalgia   . Lumbar back  pain   . Stroke   . Anxiety   . Myocardial infarction 2012  . CHF (congestive heart failure) 2012  . COPD (chronic obstructive pulmonary disease)   . Shortness of breath     OCCASIONAL  .  GERD (gastroesophageal reflux disease)   . Cancer     VULVAR CANCER  . OSA (obstructive sleep apnea)     USES O2 AT HOME WITH NASAL MASK    Family Hx:  Family History  Problem Relation Age of Onset  . Heart attack Father   . Cancer Sister   . Heart attack Brother     Vitals:  Blood pressure 138/90, pulse 66, temperature 97.9 F (36.6 C), temperature source Oral, resp. rate 16, height 5' 4.09" (1.628 m), weight 177 lb 6.4 oz (80.468 kg).  Physical Exam:  Well-nourished well-developed female in no acute distress.  Pelvic: External genitalia well-healed. His well-healed surgical incision line. Sutures were removed without difficulty. The entire suture line is intact. There is no obvious evidence of any recurrent dysplasia.  Assessment/Plan:  76 year old with VIN 3 status post surgery. She'll return to see me in 3 months for a vulvar check. Cleda Mccreedy A., MD 08/07/2012, 2:33 PM

## 2012-08-26 ENCOUNTER — Other Ambulatory Visit: Payer: Self-pay | Admitting: Hematology & Oncology

## 2012-08-30 ENCOUNTER — Encounter: Payer: Self-pay | Admitting: *Deleted

## 2012-09-09 ENCOUNTER — Other Ambulatory Visit: Payer: Self-pay | Admitting: Pulmonary Disease

## 2012-09-09 MED ORDER — CLORAZEPATE DIPOTASSIUM 7.5 MG PO TABS: 7.5000 mg | ORAL_TABLET | Freq: Three times a day (TID) | ORAL | Status: DC

## 2012-09-10 ENCOUNTER — Ambulatory Visit (INDEPENDENT_AMBULATORY_CARE_PROVIDER_SITE_OTHER): Payer: Medicare Other | Admitting: Internal Medicine

## 2012-09-10 ENCOUNTER — Encounter: Payer: Self-pay | Admitting: Internal Medicine

## 2012-09-10 VITALS — BP 156/95 | HR 73 | Ht 64.5 in | Wt 175.8 lb

## 2012-09-10 DIAGNOSIS — R55 Syncope and collapse: Secondary | ICD-10-CM

## 2012-09-10 DIAGNOSIS — Z950 Presence of cardiac pacemaker: Secondary | ICD-10-CM

## 2012-09-10 LAB — PACEMAKER DEVICE OBSERVATION
AL IMPEDENCE PM: 417 Ohm
ATRIAL PACING PM: 10
BATTERY VOLTAGE: 2.78 V
RV LEAD THRESHOLD: 1 V
VENTRICULAR PACING PM: 100

## 2012-09-10 NOTE — Assessment & Plan Note (Signed)
Her medtronic PPM is working normally. Will recheck in several months. 

## 2012-09-10 NOTE — Assessment & Plan Note (Signed)
She has had no recurrent episodes. She will undergo watchful waiting. 

## 2012-09-10 NOTE — Patient Instructions (Signed)
Your physician wants you to follow-up in: 6 months with device clinic and 12 months with Dr Taylor You will receive a reminder letter in the mail two months in advance. If you don't receive a letter, please call our office to schedule the follow-up appointment.  

## 2012-09-10 NOTE — Progress Notes (Signed)
HPI [name] [name] returns today for followup. She is a very pleasant 76 year old woman with complete heart block, paroxysmal atrial fibrillation, prior hemorrhagic stroke, now on anticoagulation with Xarelto. In the interim, the patient has been stable. She complains of arthritic problems but note chest pain, shortness of breath, or syncope. Allergies  Allergen Reactions  . Sulfa Antibiotics Other (See Comments)    Doesn't like to drink lots of water.  . Sulfonamide Derivatives     REACTION: nausea  . Sumatriptan Other (See Comments)    Reaction unknown     Current Outpatient Prescriptions  Medication Sig Dispense Refill  . cetirizine (ZYRTEC) 10 MG tablet Take 10 mg by mouth daily.       . Cholecalciferol (VITAMIN D) 1000 UNITS capsule Take 1,000 Units by mouth daily.       . clorazepate (TRANXENE) 7.5 MG tablet Take 1 tablet (7.5 mg total) by mouth 3 (three) times daily. As needed for nerves  270 tablet  3  . Diphenhyd-Hydrocort-Nystatin (FIRST-DUKES MOUTHWASH) SUSP Take 5 mLs by mouth 4 (four) times daily as needed. gargle and swallow up to 4 times daily as needed for sore throat      . ESTRACE VAGINAL 0.1 MG/GM vaginal cream daily.       [name] Kitchen FLOVENT HFA 44 MCG/ACT inhaler INHALE 2 PUFFS 2 TIMES A DAY  10.6 g  5  . folic acid (FOLVITE) 1 MG tablet Take 1 mg by mouth daily.       . furosemide (LASIX) 20 MG tablet Take 2 tablets by mouth every morning  180 tablet  3  . ipratropium-albuterol (DUONEB) 0.5-2.5 (3) MG/3ML SOLN Take 3 mLs by nebulization 2 (two) times daily.       . metoprolol succinate (TOPROL-XL) 25 MG 24 hr tablet Take 1 tablet (25 mg total) by mouth daily.  90 tablet  3  . mometasone (NASONEX) 50 MCG/ACT nasal spray Place 2 sprays into the nose daily as needed. For congestion  51 g  3  . Multiple Vitamins-Minerals (CENTRUM SILVER PO) Take 1 tablet by mouth daily.        [name] Kitchen omeprazole (PRILOSEC) 40 MG capsule Take 1 capsule (40 mg total) by mouth 2 (two) times daily.  180 capsule   3  . Polyethyl Glycol-Propyl Glycol (SYSTANE) 0.4-0.3 % SOLN Apply 2 drops to eye daily.       . pravastatin (PRAVACHOL) 40 MG tablet Take 1 tablet (40 mg total) by mouth daily.  90 tablet  1  . sennosides-docusate sodium (SENOKOT-S) 8.6-50 MG tablet Take 2 tablets by mouth at bedtime.          Past Medical History  Diagnosis Date  . Allergic rhinitis   . Sarcoidosis   . Pulmonary hypertension   . Asthmatic bronchitis   . Pulmonary embolism   . Right leg DVT   . Hypertension   . CAD (coronary artery disease)   . Cardiac pacemaker in situ   . Venous insufficiency   . Hyperlipidemia   . Obesity   . PUD (peptic ulcer disease)   . Hemorrhoids   . Renal insufficiency   . UTI (lower urinary tract infection)   . DJD (degenerative joint disease)   . Cervicalgia   . Lumbar back pain   . Stroke   . Anxiety   . Myocardial infarction 2012  . CHF (congestive heart failure) 2012  . COPD (chronic obstructive pulmonary disease)   . Shortness of breath     OCCASIONAL  .  GERD (gastroesophageal reflux disease)   . Cancer     VULVAR CANCER  . OSA (obstructive sleep apnea)     USES O2 AT HOME WITH NASAL MASK    ROS:   All systems reviewed and negative except as noted in the HPI.   Past Surgical History  Procedure Date  . Appendectomy   . Cholecystectomy 01/1996    Dr. Woodfin Ganja  . Abdominal hysterectomy   . Lumbar disc surgery 1990    X2  . Left knee arthroscopy   . Ivc filter placed 07/2010  . Pacemaker insertion 2008  . Vulvar lesion removal 07/16/2012    Procedure: VULVAR LESION;  Surgeon: Rejeana Brock A. Duard Brady, MD;  Location: [facility];  Service: Gynecology;  Laterality: Right;  Wide Local Excision of the Vulvar     Family History  Problem Relation Age of Onset  . Heart attack Father   . Cancer Sister   . Heart attack Brother      History   Social History  . Marital Status: Widowed    Spouse Name: N/A    Number of Children: 4  . Years of Education: N/A    Occupational History  . retired    Social History Main Topics  . Smoking status: Never Smoker   . Smokeless tobacco: Not on file  . Alcohol Use: No  . Drug Use: No  . Sexually Active: Not on file   Other Topics Concern  . Not on file   Social History Narrative  . No narrative on file     BP 156/95  Pulse 73  Ht 5' 4.5" (1.638 m)  Wt 175 lb 12.8 oz (79.742 kg)  BMI 29.71 kg/m2  Physical Exam:  Well appearing 76 year old woman, NAD HEENT: Unremarkable Neck:  No JVD, no thyromegally Lungs:  Clear with no wheezes, rales, or rhonchi. HEART:  Regular rate rhythm, no murmurs, no rubs, no clicks Abd:  soft, positive bowel sounds, no organomegally, no rebound, no guarding Ext:  2 plus pulses, no edema, no cyanosis, no clubbing Skin:  No rashes no nodules Neuro:  CN II through XII intact, motor grossly intact   DEVICE  Normal device function.  See PaceArt for details.   Assess/Plan:

## 2012-09-11 ENCOUNTER — Other Ambulatory Visit: Payer: Self-pay | Admitting: *Deleted

## 2012-09-11 MED ORDER — METOPROLOL SUCCINATE ER 25 MG PO TB24: 25.0000 mg | ORAL_TABLET | Freq: Every day | ORAL | Status: DC

## 2012-09-11 MED ORDER — OMEPRAZOLE 40 MG PO CPDR: 40.0000 mg | DELAYED_RELEASE_CAPSULE | Freq: Two times a day (BID) | ORAL | Status: DC

## 2012-09-11 MED ORDER — FUROSEMIDE 20 MG PO TABS: ORAL_TABLET | ORAL | Status: DC

## 2012-10-22 ENCOUNTER — Telehealth: Payer: Self-pay | Admitting: Hematology & Oncology

## 2012-10-22 NOTE — Telephone Encounter (Signed)
Pt moved 11-19 to 11-27

## 2012-10-31 ENCOUNTER — Ambulatory Visit: Payer: Medicare Other | Attending: Gynecologic Oncology | Admitting: Gynecologic Oncology

## 2012-10-31 ENCOUNTER — Encounter: Payer: Self-pay | Admitting: Gynecologic Oncology

## 2012-10-31 VITALS — BP 158/98 | HR 64 | Temp 98.2°F | Resp 20 | Ht 64.0 in | Wt 180.1 lb

## 2012-10-31 DIAGNOSIS — K219 Gastro-esophageal reflux disease without esophagitis: Secondary | ICD-10-CM | POA: Insufficient documentation

## 2012-10-31 DIAGNOSIS — I252 Old myocardial infarction: Secondary | ICD-10-CM | POA: Insufficient documentation

## 2012-10-31 DIAGNOSIS — Z950 Presence of cardiac pacemaker: Secondary | ICD-10-CM | POA: Insufficient documentation

## 2012-10-31 DIAGNOSIS — E785 Hyperlipidemia, unspecified: Secondary | ICD-10-CM | POA: Insufficient documentation

## 2012-10-31 DIAGNOSIS — J449 Chronic obstructive pulmonary disease, unspecified: Secondary | ICD-10-CM | POA: Insufficient documentation

## 2012-10-31 DIAGNOSIS — Z86718 Personal history of other venous thrombosis and embolism: Secondary | ICD-10-CM | POA: Insufficient documentation

## 2012-10-31 DIAGNOSIS — I2510 Atherosclerotic heart disease of native coronary artery without angina pectoris: Secondary | ICD-10-CM | POA: Insufficient documentation

## 2012-10-31 DIAGNOSIS — G4733 Obstructive sleep apnea (adult) (pediatric): Secondary | ICD-10-CM | POA: Insufficient documentation

## 2012-10-31 DIAGNOSIS — Z86711 Personal history of pulmonary embolism: Secondary | ICD-10-CM | POA: Insufficient documentation

## 2012-10-31 DIAGNOSIS — D071 Carcinoma in situ of vulva: Secondary | ICD-10-CM | POA: Insufficient documentation

## 2012-10-31 DIAGNOSIS — I10 Essential (primary) hypertension: Secondary | ICD-10-CM | POA: Insufficient documentation

## 2012-10-31 DIAGNOSIS — J4489 Other specified chronic obstructive pulmonary disease: Secondary | ICD-10-CM | POA: Insufficient documentation

## 2012-10-31 DIAGNOSIS — E669 Obesity, unspecified: Secondary | ICD-10-CM | POA: Insufficient documentation

## 2012-10-31 DIAGNOSIS — Z8673 Personal history of transient ischemic attack (TIA), and cerebral infarction without residual deficits: Secondary | ICD-10-CM | POA: Insufficient documentation

## 2012-10-31 DIAGNOSIS — Z79899 Other long term (current) drug therapy: Secondary | ICD-10-CM | POA: Insufficient documentation

## 2012-10-31 NOTE — Patient Instructions (Signed)
RTC prn

## 2012-10-31 NOTE — Progress Notes (Signed)
Consult Note: Gyn-Onc  [name] [name] 76 y.o. female  CC:  Chief Complaint  Patient presents with  . VIN III    Follow up    HPI: Patient is a 76 year old gravida 5 para 4 aborta 1 who approximately 6 months ago began experiencing some dysuria. She was diagnosed with 3 urinary tract infections and went to see a urologist. The neurologist felt that she might need to see infectious disease specialists referred her back to Dr. Kriste [name] her primary care physician. Dr. Kriste [name]. felt to be most appropriate for her to see a gynecologist she was referred to Dr. Juliene [name]. On exam Dr. 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 There is no evidence of any invasive malignancy. It is for this reason that she is referred to [place].  I initially saw her June 11. At that time she was seen to arrange care issues for her elderly mother it cannot schedule surgery right away.   Interval History:  On July 30 she underwent a wide local excision of the vulva. Operative findings included a 3 x 4 cm lesion that was well demarcated involving the right of the vulva and perineal body. She tolerated the procedure well. 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 She's had no bleeding or discharge. She had no pain.   Current Meds:  Outpatient Encounter Prescriptions as of 10/31/2012  Medication Sig Dispense Refill  . cetirizine (ZYRTEC) 10 MG tablet Take 10 mg by mouth daily.       . Cholecalciferol (VITAMIN D) 1000 UNITS capsule Take 1,000 Units by mouth daily.       . Diphenhyd-Hydrocort-Nystatin (FIRST-DUKES MOUTHWASH) SUSP Take 5 mLs by mouth 4 (four) times daily as needed. gargle and swallow up to 4 times daily as needed for sore throat      . ESTRACE VAGINAL 0.1 MG/GM vaginal cream daily.       [name] Kitchen FLOVENT HFA 44  MCG/ACT inhaler INHALE 2 PUFFS 2 TIMES A DAY  10.6 g  5  . folic acid (FOLVITE) 1 MG tablet Take 1 mg by mouth daily.       . furosemide (LASIX) 20 MG tablet Take 2 tablets by mouth every morning  180 tablet  0  . ipratropium-albuterol (DUONEB) 0.5-2.5 (3) MG/3ML SOLN Take 3 mLs by nebulization 2 (two) times daily.       . metoprolol succinate (TOPROL-XL) 25 MG 24 hr tablet Take 1 tablet (25 mg total) by mouth daily.  90 tablet  0  . mometasone (NASONEX) 50 MCG/ACT nasal spray Place 2 sprays into the nose daily as needed. For congestion  51 g  3  . Multiple Vitamins-Minerals (CENTRUM SILVER PO) Take 1 tablet by mouth daily.        [name] Kitchen omeprazole (PRILOSEC) 40 MG capsule Take 1 capsule (40 mg total) by mouth 2 (two) times daily.  180 capsule  0  . Polyethyl Glycol-Propyl Glycol (SYSTANE) 0.4-0.3 % SOLN Apply 2 drops to eye daily.       . pravastatin (PRAVACHOL) 40 MG tablet Take 1 tablet (40 mg total) by mouth daily.  90 tablet  1  . sennosides-docusate sodium (SENOKOT-S) 8.6-50 MG tablet Take 2 tablets by mouth at bedtime.       . clorazepate (TRANXENE) 7.5 MG tablet Take 1 tablet (7.5 mg total) by mouth 3 (  three) times daily. As needed for nerves  270 tablet  3    Allergy:  Allergies  Allergen Reactions  . Sulfa Antibiotics Other (See Comments)    Doesn't like to drink lots of water.  . Sulfonamide Derivatives     REACTION: nausea  . Sumatriptan Other (See Comments)    Reaction unknown    Social Hx:   History   Social History  . Marital Status: Widowed    Spouse Name: N/A    Number of Children: 4  . Years of Education: N/A   Occupational History  . retired    Social History Main Topics  . Smoking status: Never Smoker   . Smokeless tobacco: Not on file  . Alcohol Use: No  . Drug Use: No  . Sexually Active: Not on file   Other Topics Concern  . Not on file   Social History Narrative  . No narrative on file    Past Surgical Hx:  Past Surgical History  Procedure Date    . Appendectomy   . Cholecystectomy 01/1996    Dr. Woodfin Ganja  . Abdominal hysterectomy   . Lumbar disc surgery 1990    X2  . Left knee arthroscopy   . Ivc filter placed 07/2010  . Pacemaker insertion 2008  . Vulvar lesion removal 07/16/2012    Procedure: VULVAR LESION;  Surgeon: Rejeana Brock A. Duard Brady, MD;  Location: [facility];  Service: Gynecology;  Laterality: Right;  Wide Local Excision of the Vulvar    Past Medical Hx:  Past Medical History  Diagnosis Date  . Allergic rhinitis   . Sarcoidosis   . Pulmonary hypertension   . Asthmatic bronchitis   . Pulmonary embolism   . Right leg DVT   . Hypertension   . CAD (coronary artery disease)   . Cardiac pacemaker in situ   . Venous insufficiency   . Hyperlipidemia   . Obesity   . PUD (peptic ulcer disease)   . Hemorrhoids   . Renal insufficiency   . UTI (lower urinary tract infection)   . DJD (degenerative joint disease)   . Cervicalgia   . Lumbar back pain   . Stroke   . Anxiety   . Myocardial infarction 2012  . CHF (congestive heart failure) 2012  . COPD (chronic obstructive pulmonary disease)   . Shortness of breath     OCCASIONAL  . GERD (gastroesophageal reflux disease)   . Cancer     VULVAR CANCER  . OSA (obstructive sleep apnea)     USES O2 AT HOME WITH NASAL MASK    Family Hx:  Family History  Problem Relation Age of Onset  . Heart attack Father   . Cancer Sister   . Heart attack Brother     Vitals:  Blood pressure 158/98, pulse 64, temperature 98.2 F (36.8 C), resp. rate 20, height 5\' 4"  (1.626 m), weight 180 lb 1.6 oz (81.693 kg).  Physical Exam: Well-nourished well-developed female in no acute distress.  Pelvic external genitalia within normal limits. Your surgical changes. There is no evidence of recurrent dysplasia.  Assessment/Plan: 60 soon to be 76 year old with VIN 3. She underwent a wide local excision of the summer to have positive margins. There is currently no evidence of recurrent disease 4  months later. She's returned to regular followup under the care of Dr. Juliene [name]. She'll be released from our clinic to return to see [place] on a when necessary basis.   Jowel Waltner A., MD 10/31/2012, 4:03 PM

## 2012-11-05 ENCOUNTER — Other Ambulatory Visit: Payer: Medicare Other | Admitting: Lab

## 2012-11-05 ENCOUNTER — Ambulatory Visit: Payer: Medicare Other | Admitting: Hematology & Oncology

## 2012-11-06 ENCOUNTER — Ambulatory Visit: Payer: Medicare Other | Admitting: Gynecologic Oncology

## 2012-11-13 ENCOUNTER — Ambulatory Visit (HOSPITAL_BASED_OUTPATIENT_CLINIC_OR_DEPARTMENT_OTHER): Payer: Medicare Other | Admitting: Medical

## 2012-11-13 ENCOUNTER — Other Ambulatory Visit (HOSPITAL_BASED_OUTPATIENT_CLINIC_OR_DEPARTMENT_OTHER): Payer: Medicare Other

## 2012-11-13 ENCOUNTER — Other Ambulatory Visit: Payer: Medicare Other | Admitting: Lab

## 2012-11-13 VITALS — BP 136/72 | HR 88 | Temp 97.6°F | Resp 18 | Ht 64.0 in | Wt 182.0 lb

## 2012-11-13 DIAGNOSIS — I82409 Acute embolism and thrombosis of unspecified deep veins of unspecified lower extremity: Secondary | ICD-10-CM

## 2012-11-13 LAB — CBC WITH DIFFERENTIAL (CANCER CENTER ONLY)
BASO#: 0 10*3/uL (ref 0.0–0.2)
Eosinophils Absolute: 0.2 10*3/uL (ref 0.0–0.5)
HGB: 11.7 g/dL (ref 11.6–15.9)
LYMPH%: 23 % (ref 14.0–48.0)
MCV: 92 fL (ref 81–101)
MONO#: 0.9 10*3/uL (ref 0.1–0.9)
Platelets: 180 10*3/uL (ref 145–400)
RBC: 3.87 10*6/uL (ref 3.70–5.32)
WBC: 6.9 10*3/uL (ref 3.9–10.0)

## 2012-11-13 MED ORDER — RIVAROXABAN 20 MG PO TABS: 20.0000 mg | ORAL_TABLET | Freq: Every day | ORAL | Status: DC

## 2012-11-13 NOTE — Progress Notes (Signed)
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 #2 Vulvar in situ neoplasm-grade 3.  Current therapy: #1 Xarelto 20 mg by mouth daily. #2 patient is status post wide local excision of the vulvar lesion.  Interim history: [name] presents today for an office followup visit.  Overall, she, reports, that she's doing quite well. She recently saw Dr. Duard Brady , who informed her that there is no evidence of any type of recurrent disease, and she can continue to followup with Dr. Juliene Pina, her gynecologist.  She is very pleased with this news.  She remains on a Xarelto without any problems.  She does not report any nausea or vomiting.  She has an excellent appetite.  She denies any obvious, or abnormal bleeding or bruising.  She does not report any lower leg swelling.  She does not report any new, shortness of breath, or chest pain, or cough.  She denies any fevers, chills, or night sweats.  She denies any headaches, blurry vision, or rashes.  Review of Systems: Constitutional:Negative for malaise/fatigue, fever, chills, weight loss, diaphoresis, activity change, appetite change, and unexpected weight change.  HEENT: Negative for double vision, blurred vision, visual loss, ear pain, tinnitus, congestion, rhinorrhea, epistaxis sore throat or sinus disease, oral pain/lesion, tongue soreness Respiratory: Negative for cough, chest tightness, shortness of breath, wheezing and stridor.  Cardiovascular: Negative for chest pain, palpitations, leg swelling, orthopnea, PND, DOE or claudication Gastrointestinal: Negative for nausea, vomiting, abdominal pain, diarrhea, constipation, blood in stool, melena, hematochezia, abdominal distention, anal bleeding, rectal pain, anorexia and hematemesis.  Genitourinary: Negative for dysuria, frequency, hematuria,  Musculoskeletal: Negative for myalgias, back pain, joint swelling, arthralgias and gait problem.  Skin: Negative for rash, color change, pallor and wound.   Neurological:. Negative for dizziness/light-headedness, tremors, seizures, syncope, facial asymmetry, speech difficulty, weakness, numbness, headaches and paresthesias.  Hematological: Negative for adenopathy. Does not bruise/bleed easily.  Psychiatric/Behavioral:  Negative for depression, no loss of interest in normal activity or change in sleep pattern.   Physical Exam: This is a very pleasant, well-developed, well-nourished, 76 year old, white female, in no obvious distress Vitals: Temperature 97.6 degrees, pulse 88, respirations 18, blood pressure 136/72.  Weight 192 pounds HEENT reveals a normocephalic, atraumatic skull, no scleral icterus, no oral lesions  Neck is supple without any cervical or supraclavicular adenopathy.  Lungs are clear to auscultation bilaterally. There are no wheezes, rales or rhonci Cardiac is regular rate and rhythm with a normal S1 and S2. There are no murmurs, rubs, or bruits.  Abdomen is soft with good bowel sounds, there is no palpable mass. There is no palpable hepatosplenomegaly. There is no palpable fluid wave.  Musculoskeletal no tenderness of the spine, ribs, or hips.  Extremities there are no clubbing, cyanosis, or edema.  Skin no petechia, purpura or ecchymosis Neurologic is nonfocal.  Laboratory Data: White count 6.9, hemoglobin 11.7, hematocrit 35.5.  Platelets 180,000  Current Outpatient Prescriptions on File Prior to Visit  Medication Sig Dispense Refill  . cetirizine (ZYRTEC) 10 MG tablet Take 10 mg by mouth daily.       . Cholecalciferol (VITAMIN D) 1000 UNITS capsule Take 1,000 Units by mouth daily.       . clorazepate (TRANXENE) 7.5 MG tablet Take 1 tablet (7.5 mg total) by mouth 3 (three) times daily. As needed for nerves  270 tablet  3  . Diphenhyd-Hydrocort-Nystatin (FIRST-DUKES MOUTHWASH) SUSP Take 5 mLs by mouth 4 (four) times daily as needed. gargle and swallow up to 4 times  daily as needed for sore throat      . ESTRACE VAGINAL 0.1  MG/GM vaginal cream Place 2 g vaginally daily.       [name] Kitchen FLOVENT HFA 44 MCG/ACT inhaler INHALE 2 PUFFS 2 TIMES A DAY  10.6 g  5  . folic acid (FOLVITE) 1 MG tablet Take 1 mg by mouth daily.       . furosemide (LASIX) 20 MG tablet Take 2 tablets by mouth every morning  180 tablet  0  . ipratropium-albuterol (DUONEB) 0.5-2.5 (3) MG/3ML SOLN Take 3 mLs by nebulization 2 (two) times daily.       . metoprolol succinate (TOPROL-XL) 25 MG 24 hr tablet Take 1 tablet (25 mg total) by mouth daily.  90 tablet  0  . mometasone (NASONEX) 50 MCG/ACT nasal spray Place 2 sprays into the nose daily as needed. For congestion  51 g  3  . Multiple Vitamins-Minerals (CENTRUM SILVER PO) Take 1 tablet by mouth daily.        [name] Kitchen omeprazole (PRILOSEC) 40 MG capsule Take 1 capsule (40 mg total) by mouth 2 (two) times daily.  180 capsule  0  . Polyethyl Glycol-Propyl Glycol (SYSTANE) 0.4-0.3 % SOLN Apply 2 drops to eye daily.       . pravastatin (PRAVACHOL) 40 MG tablet Take 1 tablet (40 mg total) by mouth daily.  90 tablet  1  . sennosides-docusate sodium (SENOKOT-S) 8.6-50 MG tablet Take 2 tablets by mouth at bedtime.        Assessment/Plan: This is a pleasant, 76 year old, white female, with the following issues:  #1 recurrent deep vein thrombosis of the right leg.  She remains on long-term Xarelto.  She is tolerating this well.  I do not see any problems with respect to, DVT, recurrence.  #2.  History of vulvar in situ neoplasm-grade 3.  Again, she is status post wide local excision of the vulvar lesion 4 months ago to date.  There's been no evidence of recurrent dysplasia.  She will followup with her gynecologist for regular visits.  #3.  Followup.  We will follow back up with Ms. Buhl in 3 months, but before then should there be any questions or concerns.

## 2012-11-14 LAB — D-DIMER, QUANTITATIVE: D-Dimer, Quant: 1.84 ug/mL-FEU — ABNORMAL HIGH (ref 0.00–0.48)

## 2012-11-20 ENCOUNTER — Ambulatory Visit (INDEPENDENT_AMBULATORY_CARE_PROVIDER_SITE_OTHER): Payer: Medicare Other | Admitting: Pulmonary Disease

## 2012-11-20 ENCOUNTER — Encounter: Payer: Self-pay | Admitting: *Deleted

## 2012-11-20 ENCOUNTER — Encounter: Payer: Self-pay | Admitting: Pulmonary Disease

## 2012-11-20 VITALS — BP 126/74 | HR 75 | Temp 97.2°F | Ht 64.5 in | Wt 182.2 lb

## 2012-11-20 DIAGNOSIS — E669 Obesity, unspecified: Secondary | ICD-10-CM

## 2012-11-20 DIAGNOSIS — I2510 Atherosclerotic heart disease of native coronary artery without angina pectoris: Secondary | ICD-10-CM

## 2012-11-20 DIAGNOSIS — I2699 Other pulmonary embolism without acute cor pulmonale: Secondary | ICD-10-CM

## 2012-11-20 DIAGNOSIS — J209 Acute bronchitis, unspecified: Secondary | ICD-10-CM

## 2012-11-20 DIAGNOSIS — M1990 Unspecified osteoarthritis, unspecified site: Secondary | ICD-10-CM

## 2012-11-20 DIAGNOSIS — I6350 Cerebral infarction due to unspecified occlusion or stenosis of unspecified cerebral artery: Secondary | ICD-10-CM

## 2012-11-20 DIAGNOSIS — F411 Generalized anxiety disorder: Secondary | ICD-10-CM

## 2012-11-20 DIAGNOSIS — I2789 Other specified pulmonary heart diseases: Secondary | ICD-10-CM

## 2012-11-20 DIAGNOSIS — Z950 Presence of cardiac pacemaker: Secondary | ICD-10-CM

## 2012-11-20 DIAGNOSIS — D069 Carcinoma in situ of cervix, unspecified: Secondary | ICD-10-CM

## 2012-11-20 DIAGNOSIS — I82409 Acute embolism and thrombosis of unspecified deep veins of unspecified lower extremity: Secondary | ICD-10-CM

## 2012-11-20 DIAGNOSIS — E785 Hyperlipidemia, unspecified: Secondary | ICD-10-CM

## 2012-11-20 DIAGNOSIS — K279 Peptic ulcer, site unspecified, unspecified as acute or chronic, without hemorrhage or perforation: Secondary | ICD-10-CM

## 2012-11-20 DIAGNOSIS — I10 Essential (primary) hypertension: Secondary | ICD-10-CM

## 2012-11-20 DIAGNOSIS — D869 Sarcoidosis, unspecified: Secondary | ICD-10-CM

## 2012-11-20 DIAGNOSIS — N259 Disorder resulting from impaired renal tubular function, unspecified: Secondary | ICD-10-CM
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
## 2012-11-20 NOTE — Patient Instructions (Addendum)
Today we updated your med list in our EPIC system...    Continue your current medications the same...  Call for any problems or if we can be of service in any way...  Let's plan a follow up visit in 6 months... 

## 2012-12-04 ENCOUNTER — Other Ambulatory Visit: Payer: Self-pay | Admitting: Pulmonary Disease

## 2012-12-04 MED ORDER — FLUTICASONE PROPIONATE 50 MCG/ACT NA SUSP: 2.0000 | Freq: Every day | NASAL | Status: DC

## 2012-12-04 MED ORDER — BECLOMETHASONE DIPROPIONATE 40 MCG/ACT IN AERS: 2.0000 | INHALATION_SPRAY | Freq: Two times a day (BID) | RESPIRATORY_TRACT | Status: DC

## 2012-12-14 ENCOUNTER — Other Ambulatory Visit: Payer: Self-pay | Admitting: Pulmonary Disease

## 2013-01-02 ENCOUNTER — Encounter: Payer: Self-pay | Admitting: Pulmonary Disease

## 2013-01-02 LAB — HM MAMMOGRAPHY

## 2013-01-12 ENCOUNTER — Other Ambulatory Visit: Payer: Self-pay | Admitting: Pulmonary Disease

## 2013-01-15 ENCOUNTER — Other Ambulatory Visit: Payer: Self-pay | Admitting: Pulmonary Disease

## 2013-01-15 MED ORDER — PRAVASTATIN SODIUM 40 MG PO TABS: 40.0000 mg | ORAL_TABLET | Freq: Every day | ORAL | Status: DC

## 2013-02-06 ENCOUNTER — Other Ambulatory Visit: Payer: Self-pay | Admitting: Pulmonary Disease

## 2013-02-07 ENCOUNTER — Telehealth: Payer: Self-pay | Admitting: Pulmonary Disease

## 2013-02-07 NOTE — Telephone Encounter (Signed)
Per SN---  Ok to set up appt with SN in April for pre-op  TKR.  thanks

## 2013-02-07 NOTE — Telephone Encounter (Signed)
LMTCBx1.[name] [name], CMA  

## 2013-02-07 NOTE — Telephone Encounter (Signed)
I spoke with pt. She stated she is going to have knee surgery in may but does know if she needs clearance or not. She is wanting to know if SN wants to see her or not. Please advise thanks  Last OV 11/20/12 Pending OV 06/09/13

## 2013-02-10 NOTE — Telephone Encounter (Signed)
Set pt for an appt on 03/19/13 at 3:00 PM.  Pt verbalized understanding & states nothing further needed at this time.  [name] [name]

## 2013-02-10 NOTE — Telephone Encounter (Signed)
LMTCB-schedulers please make patient her appt in April with SN and sign off once done. Thanks.

## 2013-02-10 NOTE — Telephone Encounter (Signed)
Patient returning call.

## 2013-02-12 ENCOUNTER — Ambulatory Visit (HOSPITAL_BASED_OUTPATIENT_CLINIC_OR_DEPARTMENT_OTHER): Payer: Medicare Other | Admitting: Medical

## 2013-02-12 ENCOUNTER — Other Ambulatory Visit (HOSPITAL_BASED_OUTPATIENT_CLINIC_OR_DEPARTMENT_OTHER): Payer: Medicare Other | Admitting: Lab

## 2013-02-12 VITALS — BP 151/74 | HR 93 | Temp 97.9°F | Resp 16 | Ht 64.0 in | Wt 188.0 lb

## 2013-02-12 DIAGNOSIS — I82409 Acute embolism and thrombosis of unspecified deep veins of unspecified lower extremity: Secondary | ICD-10-CM

## 2013-02-12 LAB — CBC WITH DIFFERENTIAL (CANCER CENTER ONLY)
BASO#: 0 10*3/uL (ref 0.0–0.2)
HCT: 38.9 % (ref 34.8–46.6)
HGB: 12.7 g/dL (ref 11.6–15.9)
LYMPH#: 1.6 10*3/uL (ref 0.9–3.3)
MONO#: 0.9 10*3/uL (ref 0.1–0.9)
NEUT%: 60.3 % (ref 39.6–80.0)
WBC: 6.6 10*3/uL (ref 3.9–10.0)

## 2013-02-12 NOTE — Progress Notes (Signed)
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 #2 Vulvar in situ neoplasm-grade 3.  Current therapy: #1 Xarelto 20 mg by mouth daily. #2 patient is status post wide local excision of the vulvar lesion.  Interim history: [name] [name] presents today for an office followup visit.  Overall, she, reports, that she's doing quite well.  She states, that she has to have a total knee replacement on her left knee coming up on the first of May.   She continues to followup with her gynecologist for history of volvulus or in situ neoplasm, grade 3.   She remains on a Xarelto without any problems.  She 12 to come off of the Xarelto 2 days prior to her knee surgery and then she will restart it the day after her knee surgery. She does not report any nausea or vomiting.  She has an excellent appetite.  She denies any obvious, or abnormal bleeding or bruising.  She does not report any lower leg swelling.  She does not report any new, shortness of breath, or chest pain, or cough.  She denies any fevers, chills, or night sweats.  She denies any headaches, blurry vision, or rashes.  Review of Systems: Constitutional:Negative for malaise/fatigue, fever, chills, weight loss, diaphoresis, activity change, appetite change, and unexpected weight change.  HEENT: Negative for double vision, blurred vision, visual loss, ear pain, tinnitus, congestion, rhinorrhea, epistaxis sore throat or sinus disease, oral pain/lesion, tongue soreness Respiratory: Negative for cough, chest tightness, shortness of breath, wheezing and stridor.  Cardiovascular: Negative for chest pain, palpitations, leg swelling, orthopnea, PND, DOE or claudication Gastrointestinal: Negative for nausea, vomiting, abdominal pain, diarrhea, constipation, blood in stool, melena, hematochezia, abdominal distention, anal bleeding, rectal pain, anorexia and hematemesis.  Genitourinary: Negative for dysuria, frequency, hematuria,  Musculoskeletal: Negative  for myalgias, back pain, joint swelling, arthralgias and gait problem.  Skin: Negative for rash, color change, pallor and wound.  Neurological:. Negative for dizziness/light-headedness, tremors, seizures, syncope, facial asymmetry, speech difficulty, weakness, numbness, headaches and paresthesias.  Hematological: Negative for adenopathy. Does not bruise/bleed easily.  Psychiatric/Behavioral:  Negative for depression, no loss of interest in normal activity or change in sleep pattern.   Physical Exam: This is a very pleasant, well-developed, well-nourished, 77 year old, white female, in no obvious distress Vitals: Temperature 97.9, pulse 93, respirations 16, blood pressure 151/74.  Weight 188 pounds HEENT reveals a normocephalic, atraumatic skull, no scleral icterus, no oral lesions  Neck is supple without any cervical or supraclavicular adenopathy.  Lungs are clear to auscultation bilaterally. There are no wheezes, rales or rhonci Cardiac is regular rate and rhythm with a normal S1 and S2. There are no murmurs, rubs, or bruits.  Abdomen is soft with good bowel sounds, there is no palpable mass. There is no palpable hepatosplenomegaly. There is no palpable fluid wave.  Musculoskeletal no tenderness of the spine, ribs, or hips.  Extremities there are no clubbing, cyanosis, or edema.  Skin no petechia, purpura or ecchymosis Neurologic is nonfocal.  Laboratory Data: White count 6.6, hemoglobin 12.7, hematocrit 30.9, platelets 186,000  Current Outpatient Prescriptions on File Prior to Visit  Medication Sig Dispense Refill  . beclomethasone (QVAR) 40 MCG/ACT inhaler Inhale 2 puffs into the lungs 2 (two) times daily.  3 Inhaler  3  . cetirizine (ZYRTEC) 10 MG tablet Take 10 mg by mouth daily.       . Cholecalciferol (VITAMIN D) 1000 UNITS capsule Take 1,000 Units by mouth daily.       [name] Kitchen  clorazepate (TRANXENE) 7.5 MG tablet Take 1 tablet (7.5 mg total) by mouth 3 (three) times daily. As needed for  nerves  270 tablet  3  . Diphenhyd-Hydrocort-Nystatin (FIRST-DUKES MOUTHWASH) SUSP Take 5 mLs by mouth 4 (four) times daily as needed. gargle and swallow up to 4 times daily as needed for sore throat      . ESTRACE VAGINAL 0.1 MG/GM vaginal cream Place 2 g vaginally daily.       [name] Kitchen FLOVENT HFA 44 MCG/ACT inhaler INHALE 2 PUFFS 2 TIMES A DAY  10.6 g  5  . fluticasone (FLONASE) 50 MCG/ACT nasal spray Place 2 sprays into the nose daily.  48 g  3  . folic acid (FOLVITE) 1 MG tablet Take 1 mg by mouth daily.       . furosemide (LASIX) 20 MG tablet Take 2 tablets by mouth  every morning  180 tablet  2  . ipratropium-albuterol (DUONEB) 0.5-2.5 (3) MG/3ML SOLN Take 3 mLs by nebulization 2 (two) times daily.       . metoprolol succinate (TOPROL-XL) 25 MG 24 hr tablet Take 1 tablet by mouth  daily  90 tablet  3  . Multiple Vitamins-Minerals (CENTRUM SILVER PO) Take 1 tablet by mouth daily.        [name] Kitchen omeprazole (PRILOSEC) 40 MG capsule Take 1 capsule by mouth  twice a day  180 capsule  2  . Polyethyl Glycol-Propyl Glycol (SYSTANE) 0.4-0.3 % SOLN Apply 2 drops to eye daily.       . pravastatin (PRAVACHOL) 40 MG tablet Take 1 tablet (40 mg total) by mouth daily.  90 tablet  3  . Rivaroxaban (XARELTO) 20 MG TABS Take 1 tablet (20 mg total) by mouth daily.  90 tablet  1  . sennosides-docusate sodium (SENOKOT-S) 8.6-50 MG tablet Take 2 tablets by mouth at bedtime.        No current facility-administered medications on file prior to visit.   Assessment/Plan: This is a pleasant, 77 year old, white female, with the following issues:  #1 recurrent deep vein thrombosis of the right leg.  She remains on long-term Xarelto.  She is tolerating this well.  I do not see any problems with respect to, DVT, recurrence.  #2.  Total left knee replacement scheduled the first of May.  She will need to come off of the Xarelto 2 days prior to her knee surgery and then restarted the day after her knee surgery.  #3.  History of  vulvar in situ neoplasm-grade 3.  Again, she is status post wide local excision of the vulvar lesion 4 months ago to date.  There's been no evidence of recurrent dysplasia.  She will followup with her gynecologist for regular visits.  #4.  Followup.  We will follow back up with Ms. Esquilin in 3 months, but before then should there be any questions or concerns.

## 2013-02-13 ENCOUNTER — Telehealth: Payer: Self-pay | Admitting: Hematology & Oncology

## 2013-02-13 NOTE — Telephone Encounter (Signed)
Mailed may schedule °

## 2013-02-18 ENCOUNTER — Telehealth: Payer: Self-pay | Admitting: Pulmonary Disease

## 2013-02-18 MED ORDER — CLORAZEPATE DIPOTASSIUM 7.5 MG PO TABS: 7.5000 mg | ORAL_TABLET | Freq: Three times a day (TID) | ORAL | Status: DC

## 2013-02-18 NOTE — Telephone Encounter (Signed)
rx has been printed out and will be faxed once signed by SN.  Called and spoke with pt and she is aware. Nothing further is needed.

## 2013-02-18 NOTE — Telephone Encounter (Signed)
Last seen 11/20/12 Last fill 09/09/12 #270 with 3 additional refills  Please advise if this is okay to fill. Thanks.

## 2013-02-27 ENCOUNTER — Ambulatory Visit (INDEPENDENT_AMBULATORY_CARE_PROVIDER_SITE_OTHER): Payer: Medicare Other | Admitting: *Deleted

## 2013-02-27 ENCOUNTER — Other Ambulatory Visit: Payer: Self-pay | Admitting: Internal Medicine

## 2013-02-27 ENCOUNTER — Encounter: Payer: Self-pay | Admitting: Internal Medicine

## 2013-02-27 LAB — PACEMAKER DEVICE OBSERVATION
ATRIAL PACING PM: 12
BAMS-0001: 175 {beats}/min
VENTRICULAR PACING PM: 100

## 2013-02-27 NOTE — Progress Notes (Signed)
PPM check 

## 2013-03-19 ENCOUNTER — Ambulatory Visit (INDEPENDENT_AMBULATORY_CARE_PROVIDER_SITE_OTHER): Payer: Medicare Other | Admitting: Pulmonary Disease

## 2013-03-19 ENCOUNTER — Encounter: Payer: Self-pay | Admitting: Pulmonary Disease

## 2013-03-19 ENCOUNTER — Ambulatory Visit: Payer: Medicare Other | Admitting: Pulmonary Disease

## 2013-03-19 VITALS — BP 138/80 | HR 77 | Temp 98.1°F | Ht 64.5 in | Wt 184.6 lb

## 2013-03-19 DIAGNOSIS — Z950 Presence of cardiac pacemaker: Secondary | ICD-10-CM

## 2013-03-19 DIAGNOSIS — I2699 Other pulmonary embolism without acute cor pulmonale: Secondary | ICD-10-CM

## 2013-03-19 DIAGNOSIS — N259 Disorder resulting from impaired renal tubular function, unspecified: Secondary | ICD-10-CM

## 2013-03-19 DIAGNOSIS — E785 Hyperlipidemia, unspecified: Secondary | ICD-10-CM

## 2013-03-19 DIAGNOSIS — I10 Essential (primary) hypertension: Secondary | ICD-10-CM

## 2013-03-19 DIAGNOSIS — I2789 Other specified pulmonary heart diseases: Secondary | ICD-10-CM

## 2013-03-19 DIAGNOSIS — F411 Generalized anxiety disorder: Secondary | ICD-10-CM

## 2013-03-19 DIAGNOSIS — D869 Sarcoidosis, unspecified: Secondary | ICD-10-CM

## 2013-03-19 DIAGNOSIS — E669 Obesity, unspecified: Secondary | ICD-10-CM

## 2013-03-19 DIAGNOSIS — M1990 Unspecified osteoarthritis, unspecified site: Secondary | ICD-10-CM

## 2013-03-19 DIAGNOSIS — R55 Syncope and collapse: Secondary | ICD-10-CM

## 2013-03-19 DIAGNOSIS — D069 Carcinoma in situ of cervix, unspecified: Secondary | ICD-10-CM

## 2013-03-19 DIAGNOSIS — I2510 Atherosclerotic heart disease of native coronary artery without angina pectoris: Secondary | ICD-10-CM

## 2013-03-19 NOTE — Patient Instructions (Addendum)
Today we updated your med list in our EPIC system...    Continue your current medications the same...  We will send a note to DrAlusio- ok for surg...  Call for any questions...  Let's plan a follow up visit in 18mo, sooner if needed for problems.[name] KitchenMarland Kitchen
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
## 2013-04-01 ENCOUNTER — Telehealth: Payer: Self-pay | Admitting: Pulmonary Disease

## 2013-04-01 MED ORDER — FLUTICASONE PROPIONATE HFA 44 MCG/ACT IN AERO: INHALATION_SPRAY | RESPIRATORY_TRACT | Status: DC

## 2013-04-01 NOTE — Telephone Encounter (Signed)
Pt requesting rx for 3 months for  flovent inhaler sent to [facility] . rx sent nothing further needed.

## 2013-04-02 NOTE — Progress Notes (Signed)
Need orders please - pt coming for preop 04/15/13 thank you

## 2013-04-03 ENCOUNTER — Other Ambulatory Visit: Payer: Self-pay | Admitting: Orthopedic Surgery

## 2013-04-03 MED ORDER — DEXAMETHASONE SODIUM PHOSPHATE 10 MG/ML IJ SOLN: 10.0000 mg | Freq: Once | INTRAMUSCULAR | Status: DC

## 2013-04-03 MED ORDER — BUPIVACAINE LIPOSOME 1.3 % IJ SUSP: 20.0000 mL | Freq: Once | INTRAMUSCULAR | Status: DC

## 2013-04-03 NOTE — Progress Notes (Signed)
Preoperative surgical orders have been place into the Epic hospital system for [name] [name] on 04/03/2013, 12:52 PM  by Patrica Duel for surgery on 04/21/2013.  Preop Total Knee orders including Experal, IV Tylenol, and IV Decadron as long as there are no contraindications to the above medications. Avel Peace, PA-C

## 2013-04-07 ENCOUNTER — Other Ambulatory Visit: Payer: Self-pay | Admitting: *Deleted

## 2013-04-07 ENCOUNTER — Encounter (HOSPITAL_COMMUNITY): Payer: Self-pay | Admitting: Pharmacy Technician

## 2013-04-07 DIAGNOSIS — I82409 Acute embolism and thrombosis of unspecified deep veins of unspecified lower extremity: Secondary | ICD-10-CM

## 2013-04-07 MED ORDER — RIVAROXABAN 20 MG PO TABS: 20.0000 mg | ORAL_TABLET | Freq: Every day | ORAL | Status: DC

## 2013-04-15 ENCOUNTER — Encounter (HOSPITAL_COMMUNITY)
Admission: RE | Admit: 2013-04-15 | Discharge: 2013-04-15 | Disposition: A | Discharge status: Home / Self Care | Payer: Medicare Other | Source: Ambulatory Visit | Attending: Orthopedic Surgery | Admitting: Orthopedic Surgery

## 2013-04-15 ENCOUNTER — Other Ambulatory Visit: Payer: Self-pay

## 2013-04-15 ENCOUNTER — Encounter (HOSPITAL_COMMUNITY): Payer: Self-pay

## 2013-04-15 DIAGNOSIS — Z01810 Encounter for preprocedural cardiovascular examination: Secondary | ICD-10-CM | POA: Insufficient documentation

## 2013-04-15 DIAGNOSIS — Z0183 Encounter for blood typing: Secondary | ICD-10-CM | POA: Insufficient documentation

## 2013-04-15 DIAGNOSIS — M1710 Unilateral primary osteoarthritis, unspecified knee: Secondary | ICD-10-CM | POA: Insufficient documentation

## 2013-04-15 DIAGNOSIS — Z01812 Encounter for preprocedural laboratory examination: Secondary | ICD-10-CM | POA: Insufficient documentation

## 2013-04-15 LAB — COMPREHENSIVE METABOLIC PANEL
ALT: 12 U/L (ref 0–35)
AST: 26 U/L (ref 0–37)
Albumin: 3.8 g/dL (ref 3.5–5.2)
Alkaline Phosphatase: 57 U/L (ref 39–117)
BUN: 22 mg/dL (ref 6–23)
Chloride: 102 mEq/L (ref 96–112)
Potassium: 4.2 mEq/L (ref 3.5–5.1)
Total Bilirubin: 0.5 mg/dL (ref 0.3–1.2)

## 2013-04-15 LAB — URINALYSIS, ROUTINE W REFLEX MICROSCOPIC
Bilirubin Urine: NEGATIVE
Glucose, UA: NEGATIVE mg/dL
Hgb urine dipstick: NEGATIVE
Ketones, ur: NEGATIVE mg/dL
Protein, ur: NEGATIVE mg/dL
Urobilinogen, UA: 0.2 mg/dL (ref 0.0–1.0)

## 2013-04-15 LAB — CBC
HCT: 39.4 % (ref 36.0–46.0)
RDW: 13.3 % (ref 11.5–15.5)
WBC: 6.7 10*3/uL (ref 4.0–10.5)

## 2013-04-15 LAB — SURGICAL PCR SCREEN: Staphylococcus aureus: NEGATIVE

## 2013-04-15 LAB — PROTIME-INR: Prothrombin Time: 15 seconds (ref 11.6–15.2)

## 2013-04-15 LAB — URINE MICROSCOPIC-ADD ON

## 2013-04-15 LAB — APTT: aPTT: 34 seconds (ref 24–37)

## 2013-04-15 NOTE — Progress Notes (Signed)
Abnormal UA faxed to Dr. Lequita Halt thru [facility]

## 2013-04-15 NOTE — Patient Instructions (Signed)
[name] [name]  04/15/2013                           YOUR PROCEDURE IS SCHEDULED ON: 04/21/13               PLEASE REPORT TO SHORT STAY CENTER AT :  9:45 AM               CALL THIS NUMBER IF ANY PROBLEMS THE DAY OF SURGERY :               [phone]                      REMEMBER:   Do not eat food or drink liquids AFTER MIDNIGHT  May have clear liquids UNTIL 6 HOURS BEFORE SURGERY (6:30 AM)  Clear liquids include soda, tea, black coffee, apple or grape juice, broth.  Take these medicines the morning of surgery with A SIP OF WATER: TRANXENE / OMEPRAZOLE / METOPROLOL / USE FLOVENT INHALER / DUONEB INHALER / NASONEX SPRAY / USE SYSTANE EYE DROPS   Do not wear jewelry, make-up   Do not wear lotions, powders, or perfumes.   Do not shave legs or underarms 12 hrs. before surgery (men may shave face)  Do not bring valuables to the hospital.  Contacts, dentures or bridgework may not be worn into surgery.  Leave suitcase in the car. After surgery it may be brought to your room.  For patients admitted to the hospital more than one night, checkout time is 11:00                          The day of discharge.   Patients discharged the day of surgery will not be allowed to drive home                             If going home same day of surgery, must have someone stay with you first                           24 hrs at home and arrange for some one to drive you home from hospital.    Special Instructions:   Please read over the following fact sheets that you were given:               1. MRSA  INFORMATION                      2. [facility] PREPARING FOR SURGERY SHEET               3. INCENTIVE SPIROMETER               4. BRING NASAL MASK TO HOSPITAL               5. BRING INHALERS TO HOSPITAL                                                X_____________________________________________________________________        Failure to follow these instructions may result in  cancellation of your surgery

## 2013-04-16 NOTE — Progress Notes (Signed)
Received note from office --RX [facility] called in for pt.

## 2013-04-16 NOTE — H&P (Signed)
TOTAL KNEE ADMISSION H&P  Patient is being admitted for left total knee arthroplasty.  Subjective:  Chief Complaint:left knee pain.  HPI: [name] [name], 77 y.o. female, has a history of pain and functional disability in the left knee due to arthritis and has failed non-surgical conservative treatments for greater than 12 weeks to includeNSAID's and/or analgesics, corticosteriod injections, viscosupplementation injections and activity modification.  Onset of symptoms was gradual, starting 5 years ago with gradually worsening course since that time. The patient noted no past surgery on the left knee(s).  Patient currently rates pain in the left knee(s) at 6 out of 10 with activity. Patient has night pain, worsening of pain with activity and weight bearing, pain that interferes with activities of daily living, pain with passive range of motion, crepitus and joint swelling.  Patient has evidence of periarticular osteophytes and joint space narrowing by imaging studies. There is no active infection.  Patient Active Problem List   Diagnosis Date Noted  . Pacemaker 09/10/2012  . CIN III (cervical intraepithelial neoplasia III) 05/28/2012  . DEEP VENOUS THROMBOPHLEBITIS, LEG, RIGHT 08/29/2010  . RENAL INSUFFICIENCY 08/17/2010  . UTI 08/17/2010  . PULMONARY EMBOLISM 07/22/2010  . OBSTRUCTIVE SLEEP APNEA 07/11/2009  . DEGENERATIVE JOINT DISEASE 01/05/2009  . CERVICALGIA 01/05/2009  . HYPERLIPIDEMIA 09/05/2008  . CORONARY ARTERY DISEASE 09/05/2008  . PPM-Medtronic 09/05/2008  . SYNCOPE 12/11/2007  . SARCOIDOSIS 12/10/2007  . OBESITY 12/10/2007  . HYPERTENSION 12/10/2007  . VENOUS INSUFFICIENCY 12/10/2007  . ALLERGIC RHINITIS 12/10/2007  . PUD 12/10/2007  . ANXIETY 09/30/2007  . PULMONARY HYPERTENSION 09/30/2007  . STROKE 09/30/2007  . HEMORRHOIDS 09/30/2007  . ASTHMATIC BRONCHITIS, ACUTE 09/30/2007  . BACK PAIN, LUMBAR 09/30/2007   Past Medical History  Diagnosis Date  . Allergic  rhinitis   . Sarcoidosis   . Pulmonary hypertension   . Asthmatic bronchitis   . Pulmonary embolism   . Hypertension   . CAD (coronary artery disease)   . Cardiac pacemaker in situ   . Venous insufficiency   . Hyperlipidemia   . Obesity   . PUD (peptic ulcer disease)   . Hemorrhoids   . Renal insufficiency   . UTI (lower urinary tract infection)   . DJD (degenerative joint disease)   . Cervicalgia   . Lumbar back pain   . Anxiety   . Myocardial infarction 2012  . CHF (congestive heart failure) 2012  . COPD (chronic obstructive pulmonary disease)   . Shortness of breath     OCCASIONAL  . GERD (gastroesophageal reflux disease)   . Cancer     VULVAR CANCER  . Pacemaker   . Stroke 2006    balance problems remain  . OSA (obstructive sleep apnea)     USES O2 AT HOME WITH NASAL MASK    Past Surgical History  Procedure Laterality Date  . Appendectomy    . Cholecystectomy  01/1996    Dr. Woodfin Ganja  . Abdominal hysterectomy    . Lumbar disc surgery  1990    X2  . Left knee arthroscopy    . Ivc filter placed  07/2010  . Pacemaker insertion  2008  . Vulvar lesion removal  07/16/2012    Procedure: VULVAR LESION;  Surgeon: Rejeana Brock A. Duard Brady, MD;  Location: [facility];  Service: Gynecology;  Laterality: Right;  Wide Local Excision of the Vulvar     Current outpatient prescriptions: cetirizine (ZYRTEC) 10 MG tablet, Take 10 mg by mouth daily. , Disp: , Rfl: ;  Cholecalciferol (VITAMIN D) 1000 UNITS capsule, Take 1,000 Units by mouth daily. , Disp: , Rfl: ;  clorazepate (TRANXENE) 7.5 MG tablet, Take 1 tablet (7.5 mg total) by mouth 3 (three) times daily. As needed for nerves, Disp: 270 tablet, Rfl: 3 fluticasone (FLOVENT HFA) 44 MCG/ACT inhaler, INHALE 2 PUFFS 2 TIMES A DAY, Disp: 3 Inhaler, Rfl: 2;  folic acid (FOLVITE) 400 MCG tablet, Take 400 mcg by mouth daily., Disp: , Rfl: ;  furosemide (LASIX) 20 MG tablet, Take 2 tablets by mouth  every morning, Disp: 180 tablet, Rfl: 2;   ipratropium-albuterol (DUONEB) 0.5-2.5 (3) MG/3ML SOLN, Take 3 mLs by nebulization 2 (two) times daily. , Disp: , Rfl:  metoprolol succinate (TOPROL-XL) 25 MG 24 hr tablet, Take 25 mg by mouth daily before breakfast., Disp: , Rfl: ;  mometasone (NASONEX) 50 MCG/ACT nasal spray, Place 2 sprays into the nose 2 (two) times daily. , Disp: , Rfl: ;  Multiple Vitamin (MULTIVITAMIN WITH MINERALS) TABS, Take 1 tablet by mouth daily., Disp: , Rfl: ;  Multiple Vitamins-Minerals (PROTEGRA PO), Take 1 tablet by mouth daily., Disp: , Rfl:  omeprazole (PRILOSEC) 40 MG capsule, Take 1 capsule by mouth  twice a day, Disp: 180 capsule, Rfl: 2;  Polyethyl Glycol-Propyl Glycol (SYSTANE) 0.4-0.3 % SOLN, Place 2 drops into both eyes 4 (four) times daily. , Disp: , Rfl: ;  pravastatin (PRAVACHOL) 40 MG tablet, Take 40 mg by mouth at bedtime., Disp: , Rfl: ;  Rivaroxaban (XARELTO) 20 MG TABS, Take 1 tablet (20 mg total) by mouth daily., Disp: 90 tablet, Rfl: 1 sennosides-docusate sodium (SENOKOT-S) 8.6-50 MG tablet, Take 2 tablets by mouth at bedtime. , Disp: , Rfl:   Allergies  Allergen Reactions  . Sulfa Antibiotics Other (See Comments)    Doesn't like to drink lots of water.  . Sulfonamide Derivatives     REACTION: nausea  . Sumatriptan Other (See Comments)    Reaction unknown    History  Substance Use Topics  . Smoking status: Never Smoker   . Smokeless tobacco: Not on file  . Alcohol Use: No    Family History  Problem Relation Age of Onset  . Heart attack Father   . Cancer Sister   . Heart attack Brother      Review of Systems  Constitutional: Positive for chills and diaphoresis. Negative for fever, weight loss and malaise/fatigue.  HENT: Negative for hearing loss, ear pain, nosebleeds, congestion, sore throat, neck pain, tinnitus and ear discharge.   Eyes: Negative.   Respiratory: Positive for shortness of breath. Negative for cough, hemoptysis, sputum production, wheezing and stridor.    Cardiovascular: Negative.   Gastrointestinal: Positive for heartburn and constipation. Negative for nausea, vomiting, abdominal pain, diarrhea, blood in stool and melena.  Genitourinary: Positive for frequency. Negative for dysuria, urgency, hematuria and flank pain.  Musculoskeletal: Positive for back pain and joint pain. Negative for myalgias and falls.       Left knee pain  Skin: Positive for itching. Negative for rash.  Neurological: Positive for dizziness and headaches. Negative for tingling, tremors, sensory change, speech change, focal weakness, seizures, loss of consciousness and weakness.  Endo/Heme/Allergies: Negative.   Psychiatric/Behavioral: Negative.     Objective:  Physical Exam  Constitutional: She is oriented to person, place, and time. She appears well-developed and well-nourished. No distress.  HENT:  Head: Normocephalic and atraumatic.  Right Ear: External ear normal.  Left Ear: External ear normal.  Nose: Nose normal.  Mouth/Throat: Oropharynx is  clear and moist.  Eyes: Conjunctivae and EOM are normal.  Neck: Normal range of motion. Neck supple. No tracheal deviation present. No thyromegaly present.  Cardiovascular: Regular rhythm and intact distal pulses.   Murmur heard.  Systolic murmur is present with a grade of 3/6  Respiratory: Effort normal and breath sounds normal. No respiratory distress. She has no wheezes. She exhibits no tenderness.  GI: Soft. Bowel sounds are normal. She exhibits no distension and no mass. There is no tenderness.  Musculoskeletal:       Right hip: Normal.       Left hip: Normal.       Right knee: She exhibits decreased range of motion. She exhibits no swelling, no effusion and no erythema. No tenderness found.       Left knee: She exhibits decreased range of motion and swelling. She exhibits no effusion and no erythema. Tenderness found. Medial joint line and lateral joint line tenderness noted.       Right lower leg: She exhibits  no tenderness and no swelling.       Left lower leg: She exhibits no tenderness and no swelling.       Legs: Lymphadenopathy:    She has no cervical adenopathy.  Neurological: She is alert and oriented to person, place, and time. She has normal strength and normal reflexes. No sensory deficit.  Skin: No rash noted. She is not diaphoretic. No erythema.  Psychiatric: She has a normal mood and affect. Her behavior is normal.    Vital signs in last 24 hours: Temp:  [98.1 F (36.7 C)] 98.1 F (36.7 C) (04/29 1550) Pulse Rate:  [75] 75 (04/29 1550) Resp:  [16] 16 (04/29 1550) BP: (149)/(93) 149/93 mmHg (04/29 1550) SpO2:  [96 %] 96 % (04/29 1550) Weight:  [85.049 kg (187 lb 8 oz)] 85.049 kg (187 lb 8 oz) (04/29 1550)  Labs:   Estimated body mass index is 30.80 kg/(m^2) as calculated from the following:   Height as of 11/20/12: 5' 4.5" (1.638 m).   Weight as of 11/20/12: 82.645 kg (182 lb 3.2 oz).   Imaging Review Plain radiographs demonstrate severe degenerative joint disease of the left knee(s). The overall alignment issignificant varus. The bone quality appears to be fair for age and reported activity level.  Assessment/Plan:  End stage arthritis, left knee   The patient history, physical examination, clinical judgment of the provider and imaging studies are consistent with end stage degenerative joint disease of the left knee(s) and total knee arthroplasty is deemed medically necessary. The treatment options including medical management, injection therapy arthroscopy and arthroplasty were discussed at length. The risks and benefits of total knee arthroplasty were presented and reviewed. The risks due to aseptic loosening, infection, stiffness, patella tracking problems, thromboembolic complications and other imponderables were discussed. The patient acknowledged the explanation, agreed to proceed with the plan and consent was signed. Patient is being admitted for inpatient treatment for  surgery, pain control, PT, OT, prophylactic antibiotics, VTE prophylaxis, progressive ambulation and ADL's and discharge planning. The patient is planning to be discharged to skilled nursing facility    [place], [place]

## 2013-04-17 LAB — URINE CULTURE: Colony Count: >=100000

## 2013-04-21 ENCOUNTER — Encounter (HOSPITAL_COMMUNITY): Payer: Self-pay | Admitting: Registered Nurse

## 2013-04-21 ENCOUNTER — Inpatient Hospital Stay (HOSPITAL_COMMUNITY): Payer: Medicare Other | Admitting: Registered Nurse

## 2013-04-21 ENCOUNTER — Encounter (HOSPITAL_COMMUNITY)
Admission: RE | Disposition: A | Discharge status: Skilled Nursing Facility | Payer: Self-pay | Source: Ambulatory Visit | Attending: Orthopedic Surgery

## 2013-04-21 ENCOUNTER — Inpatient Hospital Stay (HOSPITAL_COMMUNITY)
Admission: RE | Admit: 2013-04-21 | Discharge: 2013-04-24 | DRG: 470 | Disposition: A | Discharge status: Skilled Nursing Facility | Payer: Medicare Other | Source: Ambulatory Visit | Attending: Orthopedic Surgery | Admitting: Orthopedic Surgery

## 2013-04-21 ENCOUNTER — Encounter (HOSPITAL_COMMUNITY): Payer: Self-pay | Admitting: *Deleted

## 2013-04-21 DIAGNOSIS — M1710 Unilateral primary osteoarthritis, unspecified knee: Principal | ICD-10-CM | POA: Diagnosis present

## 2013-04-21 DIAGNOSIS — Z86711 Personal history of pulmonary embolism: Secondary | ICD-10-CM

## 2013-04-21 DIAGNOSIS — D62 Acute posthemorrhagic anemia: Secondary | ICD-10-CM

## 2013-04-21 DIAGNOSIS — K219 Gastro-esophageal reflux disease without esophagitis: Secondary | ICD-10-CM | POA: Diagnosis present

## 2013-04-21 DIAGNOSIS — Z86718 Personal history of other venous thrombosis and embolism: Secondary | ICD-10-CM

## 2013-04-21 DIAGNOSIS — F411 Generalized anxiety disorder: Secondary | ICD-10-CM | POA: Diagnosis present

## 2013-04-21 DIAGNOSIS — J449 Chronic obstructive pulmonary disease, unspecified: Secondary | ICD-10-CM | POA: Diagnosis present

## 2013-04-21 DIAGNOSIS — Z6829 Body mass index (BMI) 29.0-29.9, adult: Secondary | ICD-10-CM

## 2013-04-21 DIAGNOSIS — E876 Hypokalemia: Secondary | ICD-10-CM

## 2013-04-21 DIAGNOSIS — Z950 Presence of cardiac pacemaker: Secondary | ICD-10-CM

## 2013-04-21 DIAGNOSIS — I252 Old myocardial infarction: Secondary | ICD-10-CM

## 2013-04-21 DIAGNOSIS — D069 Carcinoma in situ of cervix, unspecified: Secondary | ICD-10-CM | POA: Diagnosis present

## 2013-04-21 DIAGNOSIS — J4489 Other specified chronic obstructive pulmonary disease: Secondary | ICD-10-CM | POA: Diagnosis present

## 2013-04-21 DIAGNOSIS — I2510 Atherosclerotic heart disease of native coronary artery without angina pectoris: Secondary | ICD-10-CM | POA: Diagnosis present

## 2013-04-21 DIAGNOSIS — Z96652 Presence of left artificial knee joint: Secondary | ICD-10-CM

## 2013-04-21 DIAGNOSIS — I2789 Other specified pulmonary heart diseases: Secondary | ICD-10-CM | POA: Diagnosis present

## 2013-04-21 DIAGNOSIS — Z8673 Personal history of transient ischemic attack (TIA), and cerebral infarction without residual deficits: Secondary | ICD-10-CM

## 2013-04-21 DIAGNOSIS — I10 Essential (primary) hypertension: Secondary | ICD-10-CM | POA: Diagnosis present

## 2013-04-21 DIAGNOSIS — I509 Heart failure, unspecified: Secondary | ICD-10-CM | POA: Diagnosis present

## 2013-04-21 DIAGNOSIS — M179 Osteoarthritis of knee, unspecified: Secondary | ICD-10-CM

## 2013-04-21 DIAGNOSIS — G4733 Obstructive sleep apnea (adult) (pediatric): Secondary | ICD-10-CM | POA: Diagnosis present

## 2013-04-21 DIAGNOSIS — E669 Obesity, unspecified: Secondary | ICD-10-CM | POA: Diagnosis present

## 2013-04-21 DIAGNOSIS — E785 Hyperlipidemia, unspecified: Secondary | ICD-10-CM | POA: Diagnosis present

## 2013-04-21 HISTORY — PX: TOTAL KNEE ARTHROPLASTY: SHX125

## 2013-04-21 LAB — TYPE AND SCREEN

## 2013-04-21 SURGERY — ARTHROPLASTY, KNEE, TOTAL
Anesthesia: Spinal | Site: Knee | Laterality: Left | Wound class: Clean

## 2013-04-21 MED ORDER — ACETAMINOPHEN 650 MG RE SUPP: 650.0000 mg | Freq: Four times a day (QID) | RECTAL | Status: DC | PRN

## 2013-04-21 MED ORDER — MENTHOL 3 MG MT LOZG: 1.0000 | LOZENGE | OROMUCOSAL | Status: DC | PRN

## 2013-04-21 MED ORDER — TRAMADOL HCL 50 MG PO TABS
50.0000 mg | ORAL_TABLET | Freq: Four times a day (QID) | ORAL | Status: DC | PRN
  Administered 2013-04-23: 100 mg via ORAL
  Administered 2013-04-23 (×2): 50 mg via ORAL
  Administered 2013-04-24: 100 mg via ORAL
  Filled 2013-04-21: qty 2
  Filled 2013-04-21 (×2): qty 1
  Filled 2013-04-21: qty 2

## 2013-04-21 MED ORDER — BISACODYL 10 MG RE SUPP: 10.0000 mg | Freq: Every day | RECTAL | Status: DC | PRN

## 2013-04-21 MED ORDER — IPRATROPIUM-ALBUTEROL 0.5-2.5 (3) MG/3ML IN SOLN: 3.0000 mL | Freq: Two times a day (BID) | RESPIRATORY_TRACT | Status: DC

## 2013-04-21 MED ORDER — SIMVASTATIN 20 MG PO TABS
20.0000 mg | ORAL_TABLET | Freq: Every day | ORAL | Status: DC
  Administered 2013-04-21 – 2013-04-23 (×3): 20 mg via ORAL
  Filled 2013-04-21 (×4): qty 1

## 2013-04-21 MED ORDER — PANTOPRAZOLE SODIUM 40 MG PO TBEC
80.0000 mg | DELAYED_RELEASE_TABLET | Freq: Every day | ORAL | Status: DC
  Filled 2013-04-21: qty 2

## 2013-04-21 MED ORDER — ONDANSETRON HCL 4 MG PO TABS
4.0000 mg | ORAL_TABLET | Freq: Four times a day (QID) | ORAL | Status: DC | PRN
  Administered 2013-04-21: 4 mg via ORAL
  Filled 2013-04-21: qty 1

## 2013-04-21 MED ORDER — METHOCARBAMOL 500 MG PO TABS
500.0000 mg | ORAL_TABLET | Freq: Four times a day (QID) | ORAL | Status: DC | PRN
  Administered 2013-04-21 – 2013-04-24 (×5): 500 mg via ORAL
  Filled 2013-04-21 (×6): qty 1

## 2013-04-21 MED ORDER — CLORAZEPATE DIPOTASSIUM 7.5 MG PO TABS: 7.5000 mg | ORAL_TABLET | Freq: Three times a day (TID) | ORAL | Status: DC | PRN

## 2013-04-21 MED ORDER — METOPROLOL SUCCINATE ER 25 MG PO TB24
25.0000 mg | ORAL_TABLET | Freq: Every day | ORAL | Status: DC
  Administered 2013-04-22 – 2013-04-24 (×3): 25 mg via ORAL
  Filled 2013-04-21 (×4): qty 1

## 2013-04-21 MED ORDER — DIPHENHYDRAMINE HCL 12.5 MG/5ML PO ELIX: 12.5000 mg | ORAL_SOLUTION | ORAL | Status: DC | PRN

## 2013-04-21 MED ORDER — PROMETHAZINE HCL 25 MG/ML IJ SOLN: 6.2500 mg | INTRAMUSCULAR | Status: DC | PRN

## 2013-04-21 MED ORDER — SODIUM CHLORIDE 0.9 % IV SOLN: INTRAVENOUS | Status: DC

## 2013-04-21 MED ORDER — 0.9 % SODIUM CHLORIDE (POUR BTL) OPTIME
TOPICAL | Status: DC | PRN
  Administered 2013-04-21: 1000 mL

## 2013-04-21 MED ORDER — ACETAMINOPHEN 325 MG PO TABS: 650.0000 mg | ORAL_TABLET | Freq: Four times a day (QID) | ORAL | Status: DC | PRN

## 2013-04-21 MED ORDER — FLUTICASONE PROPIONATE HFA 44 MCG/ACT IN AERO
2.0000 | INHALATION_SPRAY | Freq: Two times a day (BID) | RESPIRATORY_TRACT | Status: DC
  Administered 2013-04-21 – 2013-04-24 (×6): 2 via RESPIRATORY_TRACT
  Filled 2013-04-21: qty 10.6

## 2013-04-21 MED ORDER — LIDOCAINE HCL (CARDIAC) 20 MG/ML IV SOLN
INTRAVENOUS | Status: DC | PRN
  Administered 2013-04-21: 100 mg via INTRAVENOUS

## 2013-04-21 MED ORDER — MORPHINE SULFATE 2 MG/ML IJ SOLN
1.0000 mg | INTRAMUSCULAR | Status: DC | PRN
  Administered 2013-04-21: 1 mg via INTRAVENOUS
  Filled 2013-04-21: qty 1

## 2013-04-21 MED ORDER — METHOCARBAMOL 100 MG/ML IJ SOLN: 500.0000 mg | Freq: Four times a day (QID) | INTRAVENOUS | Status: DC | PRN

## 2013-04-21 MED ORDER — OXYCODONE HCL 5 MG PO TABS
5.0000 mg | ORAL_TABLET | ORAL | Status: DC | PRN
  Administered 2013-04-21 – 2013-04-22 (×4): 5 mg via ORAL
  Administered 2013-04-22: 10 mg via ORAL
  Administered 2013-04-22: 5 mg via ORAL
  Filled 2013-04-21 (×4): qty 1
  Filled 2013-04-21: qty 2
  Filled 2013-04-21 (×4): qty 1

## 2013-04-21 MED ORDER — BUPIVACAINE LIPOSOME 1.3 % IJ SUSP
20.0000 mL | Freq: Once | INTRAMUSCULAR | Status: DC
  Filled 2013-04-21: qty 20

## 2013-04-21 MED ORDER — DEXAMETHASONE SODIUM PHOSPHATE 10 MG/ML IJ SOLN
10.0000 mg | Freq: Every day | INTRAMUSCULAR | Status: AC
  Filled 2013-04-21: qty 1

## 2013-04-21 MED ORDER — ACETAMINOPHEN 10 MG/ML IV SOLN
1000.0000 mg | Freq: Four times a day (QID) | INTRAVENOUS | Status: AC
  Administered 2013-04-21 – 2013-04-22 (×5): 1000 mg via INTRAVENOUS
  Filled 2013-04-21 (×6): qty 100

## 2013-04-21 MED ORDER — CEFAZOLIN SODIUM 1-5 GM-% IV SOLN
1.0000 g | Freq: Four times a day (QID) | INTRAVENOUS | Status: AC
  Administered 2013-04-21 – 2013-04-22 (×2): 1 g via INTRAVENOUS
  Filled 2013-04-21 (×2): qty 50

## 2013-04-21 MED ORDER — CEFAZOLIN SODIUM-DEXTROSE 2-3 GM-% IV SOLR
2.0000 g | INTRAVENOUS | Status: AC
  Administered 2013-04-21: 2 g via INTRAVENOUS

## 2013-04-21 MED ORDER — ONDANSETRON HCL 4 MG/2ML IJ SOLN
INTRAMUSCULAR | Status: DC | PRN
  Administered 2013-04-21: 4 mg via INTRAVENOUS

## 2013-04-21 MED ORDER — POLYVINYL ALCOHOL 1.4 % OP SOLN
2.0000 [drp] | Freq: Four times a day (QID) | OPHTHALMIC | Status: DC
  Administered 2013-04-22 – 2013-04-24 (×6): 2 [drp] via OPHTHALMIC
  Filled 2013-04-21: qty 15

## 2013-04-21 MED ORDER — DEXAMETHASONE 6 MG PO TABS
10.0000 mg | ORAL_TABLET | Freq: Every day | ORAL | Status: AC
  Administered 2013-04-22: 10 mg via ORAL
  Filled 2013-04-21: qty 1

## 2013-04-21 MED ORDER — EPHEDRINE SULFATE 50 MG/ML IJ SOLN
INTRAMUSCULAR | Status: DC | PRN
  Administered 2013-04-21 (×2): 5 mg via INTRAVENOUS

## 2013-04-21 MED ORDER — ACETAMINOPHEN 10 MG/ML IV SOLN
1000.0000 mg | Freq: Once | INTRAVENOUS | Status: AC
  Administered 2013-04-21: 1000 mg via INTRAVENOUS

## 2013-04-21 MED ORDER — FENTANYL CITRATE 0.05 MG/ML IJ SOLN
25.0000 ug | INTRAMUSCULAR | Status: DC | PRN
  Administered 2013-04-21: 50 ug via INTRAVENOUS

## 2013-04-21 MED ORDER — METOCLOPRAMIDE HCL 10 MG PO TABS: 5.0000 mg | ORAL_TABLET | Freq: Three times a day (TID) | ORAL | Status: DC | PRN

## 2013-04-21 MED ORDER — LORATADINE 10 MG PO TABS
10.0000 mg | ORAL_TABLET | Freq: Every day | ORAL | Status: DC
  Administered 2013-04-22 – 2013-04-24 (×3): 10 mg via ORAL
  Filled 2013-04-21 (×3): qty 1

## 2013-04-21 MED ORDER — POLYETHYLENE GLYCOL 3350 17 G PO PACK
17.0000 g | PACK | Freq: Every day | ORAL | Status: DC | PRN
  Administered 2013-04-23: 17 g via ORAL

## 2013-04-21 MED ORDER — ALBUTEROL SULFATE (5 MG/ML) 0.5% IN NEBU
2.5000 mg | INHALATION_SOLUTION | Freq: Two times a day (BID) | RESPIRATORY_TRACT | Status: DC
  Administered 2013-04-21 – 2013-04-24 (×6): 2.5 mg via RESPIRATORY_TRACT
  Filled 2013-04-21 (×6): qty 0.5

## 2013-04-21 MED ORDER — FLUTICASONE PROPIONATE 50 MCG/ACT NA SUSP
2.0000 | Freq: Every day | NASAL | Status: DC
  Administered 2013-04-23: 2 via NASAL
  Filled 2013-04-21: qty 16

## 2013-04-21 MED ORDER — BUPIVACAINE LIPOSOME 1.3 % IJ SUSP
INTRAMUSCULAR | Status: DC | PRN
  Administered 2013-04-21: 20 mL

## 2013-04-21 MED ORDER — MIDAZOLAM HCL 5 MG/5ML IJ SOLN
INTRAMUSCULAR | Status: DC | PRN
  Administered 2013-04-21 (×2): 1 mg via INTRAVENOUS

## 2013-04-21 MED ORDER — FUROSEMIDE 40 MG PO TABS
40.0000 mg | ORAL_TABLET | Freq: Every day | ORAL | Status: DC
  Administered 2013-04-21 – 2013-04-24 (×4): 40 mg via ORAL
  Filled 2013-04-21 (×4): qty 1

## 2013-04-21 MED ORDER — BUPIVACAINE HCL 0.25 % IJ SOLN
INTRAMUSCULAR | Status: DC | PRN
  Administered 2013-04-21: 20 mL

## 2013-04-21 MED ORDER — FENTANYL CITRATE 0.05 MG/ML IJ SOLN
INTRAMUSCULAR | Status: DC | PRN
  Administered 2013-04-21 (×2): 25 ug via INTRAVENOUS

## 2013-04-21 MED ORDER — METOCLOPRAMIDE HCL 5 MG/ML IJ SOLN: 5.0000 mg | Freq: Three times a day (TID) | INTRAMUSCULAR | Status: DC | PRN

## 2013-04-21 MED ORDER — SODIUM CHLORIDE 0.45 % IV SOLN
INTRAVENOUS | Status: DC
  Administered 2013-04-21 – 2013-04-22 (×2): via INTRAVENOUS

## 2013-04-21 MED ORDER — POLYETHYL GLYCOL-PROPYL GLYCOL 0.4-0.3 % OP SOLN: 2.0000 [drp] | Freq: Four times a day (QID) | OPHTHALMIC | Status: DC

## 2013-04-21 MED ORDER — FLEET ENEMA 7-19 GM/118ML RE ENEM: 1.0000 | ENEMA | Freq: Once | RECTAL | Status: AC | PRN

## 2013-04-21 MED ORDER — DOCUSATE SODIUM 100 MG PO CAPS
100.0000 mg | ORAL_CAPSULE | Freq: Two times a day (BID) | ORAL | Status: DC
  Administered 2013-04-21 – 2013-04-24 (×6): 100 mg via ORAL

## 2013-04-21 MED ORDER — ONDANSETRON HCL 4 MG/2ML IJ SOLN: 4.0000 mg | Freq: Four times a day (QID) | INTRAMUSCULAR | Status: DC | PRN

## 2013-04-21 MED ORDER — RIVAROXABAN 10 MG PO TABS
10.0000 mg | ORAL_TABLET | Freq: Every day | ORAL | Status: DC
  Administered 2013-04-22 – 2013-04-24 (×3): 10 mg via ORAL
  Filled 2013-04-21 (×4): qty 1

## 2013-04-21 MED ORDER — PROPOFOL INFUSION 10 MG/ML OPTIME
INTRAVENOUS | Status: DC | PRN
  Administered 2013-04-21: 75 ug/kg/min via INTRAVENOUS

## 2013-04-21 MED ORDER — PHENOL 1.4 % MT LIQD: 1.0000 | OROMUCOSAL | Status: DC | PRN

## 2013-04-21 MED ORDER — LACTATED RINGERS IV SOLN
INTRAVENOUS | Status: DC | PRN
  Administered 2013-04-21: 12:00:00 via INTRAVENOUS

## 2013-04-21 MED ORDER — SODIUM CHLORIDE 0.9 % IJ SOLN
INTRAMUSCULAR | Status: DC | PRN
  Administered 2013-04-21: 30 mL via INTRAVENOUS

## 2013-04-21 MED ORDER — CHLORHEXIDINE GLUCONATE 4 % EX LIQD: 60.0000 mL | Freq: Once | CUTANEOUS | Status: DC

## 2013-04-21 MED ORDER — IPRATROPIUM BROMIDE 0.02 % IN SOLN
0.5000 mg | Freq: Two times a day (BID) | RESPIRATORY_TRACT | Status: DC
  Administered 2013-04-21 – 2013-04-24 (×6): 0.5 mg via RESPIRATORY_TRACT
  Filled 2013-04-21 (×6): qty 2.5

## 2013-04-21 MED ORDER — MEPERIDINE HCL 50 MG/ML IJ SOLN: 6.2500 mg | INTRAMUSCULAR | Status: DC | PRN

## 2013-04-21 SURGICAL SUPPLY — 54 items
BAG ZIPLOCK 12X15 (MISCELLANEOUS) ×2 IMPLANT
BANDAGE ELASTIC 6 VELCRO ST LF (GAUZE/BANDAGES/DRESSINGS) ×2 IMPLANT
BANDAGE ESMARK 6X9 LF (GAUZE/BANDAGES/DRESSINGS) ×1 IMPLANT
BLADE SAG 18X100X1.27 (BLADE) ×2 IMPLANT
BLADE SAW SGTL 11.0X1.19X90.0M (BLADE) ×2 IMPLANT
BNDG ESMARK 6X9 LF (GAUZE/BANDAGES/DRESSINGS) ×2
BOWL SMART MIX CTS (DISPOSABLE) ×2 IMPLANT
CEMENT HV SMART SET (Cement) ×2 IMPLANT
CLOSURE STERI-STRIP 1/4X4 (GAUZE/BANDAGES/DRESSINGS) ×2 IMPLANT
CLOTH BEACON ORANGE TIMEOUT ST (SAFETY) ×2 IMPLANT
CUFF TOURN SGL QUICK 34 (TOURNIQUET CUFF) ×1
CUFF TRNQT CYL 34X4X40X1 (TOURNIQUET CUFF) ×1 IMPLANT
DRAPE EXTREMITY T 121X128X90 (DRAPE) ×2 IMPLANT
DRAPE POUCH INSTRU U-SHP 10X18 (DRAPES) ×2 IMPLANT
DRAPE U-SHAPE 47X51 STRL (DRAPES) ×2 IMPLANT
DRSG ADAPTIC 3X8 NADH LF (GAUZE/BANDAGES/DRESSINGS) ×2 IMPLANT
DRSG PAD ABDOMINAL 8X10 ST (GAUZE/BANDAGES/DRESSINGS) ×2 IMPLANT
DURAPREP 26ML APPLICATOR (WOUND CARE) ×2 IMPLANT
ELECT REM PT RETURN 9FT ADLT (ELECTROSURGICAL) ×2
ELECTRODE REM PT RTRN 9FT ADLT (ELECTROSURGICAL) ×1 IMPLANT
EVACUATOR 1/8 PVC DRAIN (DRAIN) ×2 IMPLANT
FACESHIELD LNG OPTICON STERILE (SAFETY) ×10 IMPLANT
GLOVE BIO SURGEON STRL SZ7.5 (GLOVE) ×2 IMPLANT
GLOVE BIO SURGEON STRL SZ8 (GLOVE) ×2 IMPLANT
GLOVE BIOGEL PI IND STRL 8 (GLOVE) ×2 IMPLANT
GLOVE BIOGEL PI INDICATOR 8 (GLOVE) ×2
GLOVE SURG SS PI 6.5 STRL IVOR (GLOVE) ×4 IMPLANT
GOWN STRL NON-REIN LRG LVL3 (GOWN DISPOSABLE) ×4 IMPLANT
GOWN STRL REIN XL XLG (GOWN DISPOSABLE) ×2 IMPLANT
HANDPIECE INTERPULSE COAX TIP (DISPOSABLE) ×1
IMMOBILIZER KNEE 20 (SOFTGOODS) ×2
IMMOBILIZER KNEE 20 THIGH 36 (SOFTGOODS) ×1 IMPLANT
KIT BASIN OR (CUSTOM PROCEDURE TRAY) ×2 IMPLANT
MANIFOLD NEPTUNE II (INSTRUMENTS) ×2 IMPLANT
NDL SAFETY ECLIPSE 18X1.5 (NEEDLE) ×1 IMPLANT
NEEDLE HYPO 18GX1.5 SHARP (NEEDLE) ×1
NS IRRIG 1000ML POUR BTL (IV SOLUTION) ×2 IMPLANT
PACK TOTAL JOINT (CUSTOM PROCEDURE TRAY) ×2 IMPLANT
PAD ABD 7.5X8 STRL (GAUZE/BANDAGES/DRESSINGS) ×2 IMPLANT
PADDING CAST COTTON 6X4 STRL (CAST SUPPLIES) ×2 IMPLANT
POSITIONER SURGICAL ARM (MISCELLANEOUS) ×2 IMPLANT
SET HNDPC FAN SPRY TIP SCT (DISPOSABLE) ×1 IMPLANT
SPONGE GAUZE 4X4 12PLY (GAUZE/BANDAGES/DRESSINGS) ×2 IMPLANT
STRIP CLOSURE SKIN 1/2X4 (GAUZE/BANDAGES/DRESSINGS) ×4 IMPLANT
SUCTION FRAZIER 12FR DISP (SUCTIONS) ×2 IMPLANT
SUT MNCRL AB 4-0 PS2 18 (SUTURE) ×2 IMPLANT
SUT VIC AB 2-0 CT1 27 (SUTURE) ×3
SUT VIC AB 2-0 CT1 TAPERPNT 27 (SUTURE) ×3 IMPLANT
SUT VLOC 180 0 24IN GS25 (SUTURE) ×2 IMPLANT
SYR 50ML LL SCALE MARK (SYRINGE) ×2 IMPLANT
TOWEL OR 17X26 10 PK STRL BLUE (TOWEL DISPOSABLE) ×4 IMPLANT
TRAY FOLEY CATH 14FRSI W/METER (CATHETERS) ×2 IMPLANT
WATER STERILE IRR 1500ML POUR (IV SOLUTION) ×2 IMPLANT
WRAP KNEE MAXI GEL POST OP (GAUZE/BANDAGES/DRESSINGS) ×4 IMPLANT

## 2013-04-21 NOTE — Transfer of Care (Signed)
Immediate Anesthesia Transfer of Care Note  Patient: [name] [name]  Procedure(s) Performed: Procedure(s): LEFT TOTAL KNEE ARTHROPLASTY (Left)  Patient Location: PACU  Anesthesia Type:MAC and Spinal  Level of Consciousness: awake, alert , oriented and patient cooperative  Airway & Oxygen Therapy: Patient Spontanous Breathing and Patient connected to face mask oxygen  Post-op Assessment: Report given to PACU RN and Post -op Vital signs reviewed and stable  Post vital signs: Reviewed and stable  Complications: No apparent anesthesia complications

## 2013-04-21 NOTE — Anesthesia Preprocedure Evaluation (Addendum)
Anesthesia Evaluation  Patient identified by MRN, date of birth, ID band Patient awake    Reviewed: Allergy & Precautions, H&P , NPO status , Patient's Chart, lab work & pertinent test results  Airway Mallampati: II TM Distance: >3 FB Neck ROM: Full    Dental no notable dental hx.    Pulmonary asthma , sleep apnea , COPD COPD inhaler and oxygen dependent, PE sarcoidosis breath sounds clear to auscultation  Pulmonary exam normal       Cardiovascular hypertension, Pt. on medications + CAD, + Past MI and +CHF + pacemaker Rhythm:Regular Rate:Normal  pulm htn   Neuro/Psych PSYCHIATRIC DISORDERS Anxiety CVA    GI/Hepatic negative GI ROS, Neg liver ROS, PUD, GERD-  ,  Endo/Other  negative endocrine ROS  Renal/GU negative Renal ROS  negative genitourinary   Musculoskeletal negative musculoskeletal ROS (+)   Abdominal   Peds negative pediatric ROS (+)  Hematology negative hematology ROS (+)   Anesthesia Other Findings   Reproductive/Obstetrics negative OB ROS                          Anesthesia Physical  Anesthesia Plan  ASA: III  Anesthesia Plan: Spinal   Post-op Pain Management:    Induction:   Airway Management Planned: Simple Face Mask  Additional Equipment:   Intra-op Plan:   Post-operative Plan:   Informed Consent: I have reviewed the patients History and Physical, chart, labs and discussed the procedure including the risks, benefits and alternatives for the proposed anesthesia with the patient or authorized representative who has indicated his/her understanding and acceptance.   Dental advisory given  Plan Discussed with: CRNA  Anesthesia Plan Comments:        Anesthesia Quick Evaluation

## 2013-04-21 NOTE — Op Note (Signed)
Pre-operative diagnosis- Osteoarthritis  Left knee(s)  Post-operative diagnosis- Osteoarthritis Left knee(s)  Procedure-  Left  Total Knee Arthroplasty  Surgeon- Gus Rankin. Stanislaw Acton, MD  Assistant- Avel Peace, PA-C   Anesthesia-  Spinal EBL-* No blood loss amount entered *  Drains Hemovac   Tourniquet time-  Total Tourniquet Time Documented: Thigh (Left) - 30 minutes Total: Thigh (Left) - 30 minutes    Complications- None  Condition-PACU - hemodynamically stable.   Brief Clinical Note  [name] [name] is a 77 y.o. year old female with end stage OA of her left knee with progressively worsening pain and dysfunction. She has constant pain, with activity and at rest and significant functional deficits with difficulties even with ADLs. She has had extensive non-op management including analgesics, injections of cortisone and viscosupplements, and home exercise program, but remains in significant pain with significant dysfunction. Radiographs show bone on bone arthritis all 3 compartments. She presents now for left Total Knee Arthroplasty.    Procedure in detail---   The patient is brought into the operating room and positioned supine on the operating table. After successful administration of  Spinal,   a tourniquet is placed high on the  Left thigh(s) and the lower extremity is prepped and draped in the usual sterile fashion. Time out is performed by the operating team and then the  Left lower extremity is wrapped in Esmarch, knee flexed and the tourniquet inflated to 300 mmHg.       A midline incision is made with a ten blade through the subcutaneous tissue to the level of the extensor mechanism. A fresh blade is used to make a medial parapatellar arthrotomy. Soft tissue over the proximal medial tibia is subperiosteally elevated to the joint line with a knife and into the semimembranosus bursa with a Cobb elevator. Soft tissue over the proximal lateral tibia is elevated with attention being  paid to avoiding the patellar tendon on the tibial tubercle. The patella is everted, knee flexed 90 degrees and the ACL and PCL are removed. Findings are bone on bone medial and patellofemoral with large global osteophytes.        The drill is used to create a starting hole in the distal femur and the canal is thoroughly irrigated with sterile saline to remove the fatty contents. The 5 degree Left  valgus alignment guide is placed into the femoral canal and the distal femoral cutting block is pinned to remove 10 mm off the distal femur. Resection is made with an oscillating saw.      The tibia is subluxed forward and the menisci are removed. The extramedullary alignment guide is placed referencing proximally at the medial aspect of the tibial tubercle and distally along the second metatarsal axis and tibial crest. The block is pinned to remove 2mm off the more deficient medial  side. Resection is made with an oscillating saw. Size 2.5is the most appropriate size for the tibia and the proximal tibia is prepared with the modular drill and keel punch for that size.      The femoral sizing guide is placed and size 3 is most appropriate. Rotation is marked off the epicondylar axis and confirmed by creating a rectangular flexion gap at 90 degrees. The size 3 cutting block is pinned in this rotation and the anterior, posterior and chamfer cuts are made with the oscillating saw. The intercondylar block is then placed and that cut is made.      Trial size 2.5 tibial component, trial size  3 posterior stabilized femur and a 10  mm posterior stabilized rotating platform insert trial is placed. Full extension is achieved with excellent varus/valgus and anterior/posterior balance throughout full range of motion. The patella is everted and thickness measured to be 22  mm. Free hand resection is taken to 12 mm, a 35 template is placed, lug holes are drilled, trial patella is placed, and it tracks normally. Osteophytes are  removed off the posterior femur with the trial in place. All trials are removed and the cut bone surfaces prepared with pulsatile lavage. Cement is mixed and once ready for implantation, the size 2.5 tibial implant, size  3 posterior stabilized femoral component, and the size 35 patella are cemented in place and the patella is held with the clamp. The trial insert is placed and the knee held in full extension. The Exparel (20 ml mixed with 30 ml saline) and .25% Bupivicaine, are injected into the extensor mechanism, posterior capsule, medial and lateral gutters and subcutaneous tissues.  All extruded cement is removed and once the cement is hard the permanent 10 mm posterior stabilized rotating platform insert is placed into the tibial tray.      The wound is copiously irrigated with saline solution and the extensor mechanism closed over a hemovac drain with #1 PDS suture. The tourniquet is released for a total tourniquet time of 30  minutes. Flexion against gravity is 140 degrees and the patella tracks normally. Subcutaneous tissue is closed with 2.0 vicryl and subcuticular with running 4.0 Monocryl. The incision is cleaned and dried and steri-strips and a bulky sterile dressing are applied. The limb is placed into a knee immobilizer and the patient is awakened and transported to recovery in stable condition.      Please note that a surgical assistant was a medical necessity for this procedure in order to perform it in a safe and expeditious manner. Surgical assistant was necessary to retract the ligaments and vital neurovascular structures to prevent injury to them and also necessary for proper positioning of the limb to allow for anatomic placement of the prosthesis.   Gus Rankin Jerzy Crotteau, MD    04/21/2013, 1:59 PM

## 2013-04-21 NOTE — Plan of Care (Signed)
Problem: Consults Goal: Diagnosis- Total Joint Replacement Primary Total Knee     

## 2013-04-21 NOTE — Interval H&P Note (Signed)
History and Physical Interval Note:  04/21/2013 10:51 AM  [name] [name]  has presented today for surgery, with the diagnosis of OSTEOARTHRITIS LEFT KNEE  The various methods of treatment have been discussed with the patient and family. After consideration of risks, benefits and other options for treatment, the patient has consented to  Procedure(s): LEFT TOTAL KNEE ARTHROPLASTY (Left) as a surgical intervention .  The patient's history has been reviewed, patient examined, no change in status, stable for surgery.  I have reviewed the patient's chart and labs.  Questions were answered to the patient's satisfaction.     Loanne Drilling

## 2013-04-21 NOTE — Anesthesia Postprocedure Evaluation (Signed)
  Anesthesia Post-op Note  Patient: [name] [name]  Procedure(s) Performed: Procedure(s) (LRB): LEFT TOTAL KNEE ARTHROPLASTY (Left)  Patient Location: PACU  Anesthesia Type: Spinal  Level of Consciousness: awake and alert   Airway and Oxygen Therapy: Patient Spontanous Breathing  Post-op Pain: mild  Post-op Assessment: Post-op Vital signs reviewed, Patient's Cardiovascular Status Stable, Respiratory Function Stable, Patent Airway and No signs of Nausea or vomiting  Last Vitals:  Filed Vitals:   04/21/13 1515  BP:   Pulse:   Temp: 36.3 C  Resp:     Post-op Vital Signs: stable   Complications: No apparent anesthesia complications

## 2013-04-21 NOTE — Preoperative (Signed)
Beta Blockers   Reason not to administer Beta Blockers:Metoprolol taken 04-21-13 at 0530

## 2013-04-22 ENCOUNTER — Encounter (HOSPITAL_COMMUNITY): Payer: Self-pay | Admitting: Orthopedic Surgery

## 2013-04-22 DIAGNOSIS — E876 Hypokalemia: Secondary | ICD-10-CM

## 2013-04-22 DIAGNOSIS — D62 Acute posthemorrhagic anemia: Secondary | ICD-10-CM

## 2013-04-22 LAB — BASIC METABOLIC PANEL
BUN: 9 mg/dL (ref 6–23)
CO2: 33 mEq/L — ABNORMAL HIGH (ref 19–32)
Calcium: 8.2 mg/dL — ABNORMAL LOW (ref 8.4–10.5)
Creatinine, Ser: 0.79 mg/dL (ref 0.50–1.10)
Glucose, Bld: 112 mg/dL — ABNORMAL HIGH (ref 70–99)

## 2013-04-22 LAB — CBC
HCT: 32.1 % — ABNORMAL LOW (ref 36.0–46.0)
Hemoglobin: 10.4 g/dL — ABNORMAL LOW (ref 12.0–15.0)
MCH: 29.1 pg (ref 26.0–34.0)
MCV: 89.7 fL (ref 78.0–100.0)
RBC: 3.58 MIL/uL — ABNORMAL LOW (ref 3.87–5.11)

## 2013-04-22 MED ORDER — NON FORMULARY: 40.0000 mg | Freq: Two times a day (BID) | Status: DC

## 2013-04-22 MED ORDER — OMEPRAZOLE 20 MG PO CPDR
40.0000 mg | DELAYED_RELEASE_CAPSULE | Freq: Two times a day (BID) | ORAL | Status: DC
  Administered 2013-04-22 – 2013-04-24 (×5): 40 mg via ORAL
  Filled 2013-04-22 (×7): qty 2

## 2013-04-22 NOTE — Progress Notes (Signed)
   Subjective: 1 Day Post-Op Procedure(s) (LRB): LEFT TOTAL KNEE ARTHROPLASTY (Left) Patient reports pain as mild and moderate.   Patient seen in rounds with Dr. Lequita Halt. Patient is well, but has had some minor complaints of pain in the knee, requiring pain medications We will start therapy today.  Plan is to go Skilled nursing facility after hospital stay. She wants to look into [facility].  Objective: Vital signs in last 24 hours: Temp:  [97.4 F (36.3 C)-99.3 F (37.4 C)] 99.1 F (37.3 C) (05/06 0625) Pulse Rate:  [60-81] 81 (05/06 0625) Resp:  [11-18] 14 (05/06 0625) BP: (109-157)/(50-87) 116/71 mmHg (05/06 0625) SpO2:  [95 %-100 %] 100 % (05/06 0625) Weight:  [79.379 kg (175 lb)] 79.379 kg (175 lb) (05/06 0358)  Intake/Output from previous day:  Intake/Output Summary (Last 24 hours) at 04/22/13 0803 Last data filed at 04/22/13 0626  Gross per 24 hour  Intake 3458.75 ml  Output   3330 ml  Net 128.75 ml    Intake/Output this shift: UOP 1800 since MN +128  Labs:  Recent Labs  04/22/13 0451  HGB 10.4*    Recent Labs  04/22/13 0451  WBC 8.4  RBC 3.58*  HCT 32.1*  PLT 136*    Recent Labs  04/22/13 0451  NA 139  K 3.3*  CL 101  CO2 33*  BUN 9  CREATININE 0.79  GLUCOSE 112*  CALCIUM 8.2*   No results found for this basename: LABPT, INR,  in the last 72 hours  EXAM General - Patient is Alert, Appropriate and Oriented Extremity - Neurovascular intact Sensation intact distally Dorsiflexion/Plantar flexion intact Dressing - dressing C/D/I Motor Function - intact, moving foot and toes well on exam.  Hemovac pulled without difficulty.  Past Medical History  Diagnosis Date  . Allergic rhinitis   . Sarcoidosis   . Pulmonary hypertension   . Asthmatic bronchitis   . Pulmonary embolism   . Hypertension   . CAD (coronary artery disease)   . Cardiac pacemaker in situ   . Venous insufficiency   . Hyperlipidemia   . Obesity   . PUD (peptic ulcer  disease)   . Hemorrhoids   . Renal insufficiency   . UTI (lower urinary tract infection)   . DJD (degenerative joint disease)   . Cervicalgia   . Lumbar back pain   . Anxiety   . Myocardial infarction 2012  . CHF (congestive heart failure) 2012  . COPD (chronic obstructive pulmonary disease)   . Shortness of breath     OCCASIONAL  . GERD (gastroesophageal reflux disease)   . Cancer     VULVAR CANCER  . Pacemaker   . Stroke 2006    balance problems remain  . OSA (obstructive sleep apnea)     USES O2 AT HOME WITH NASAL MASK    Assessment/Plan: 1 Day Post-Op Procedure(s) (LRB): LEFT TOTAL KNEE ARTHROPLASTY (Left) Principal Problem:   OA (osteoarthritis) of knee Active Problems:   Postoperative anemia due to acute blood loss   Postop Hypokalemia  Estimated body mass index is 29.59 kg/(m^2) as calculated from the following:   Height as of this encounter: 5' 4.5" (1.638 m).   Weight as of this encounter: 79.379 kg (175 lb). Advance diet Up with therapy Discharge to SNF  DVT Prophylaxis - Xarelto Weight-Bearing as tolerated to left leg No vaccines. D/C O2 and Pulse OX and try on Room Air  [name] [name] 04/22/2013, 8:03 AM

## 2013-04-22 NOTE — Progress Notes (Signed)
Clinical Social Work Department BRIEF PSYCHOSOCIAL ASSESSMENT 04/22/2013  Patient:  [name] [name], [name] [name]     Account Number:  [id number]     Admit date:  04/21/2013  Clinical Social Worker:  Candie Chroman  Date/Time:  04/22/2013 12:11 PM  Referred by:  Physician  Date Referred:  04/22/2013 Referred for  SNF Placement   Other Referral:   Interview type:  Patient Other interview type:    PSYCHOSOCIAL DATA Living Status:  ALONE Admitted from facility:   Level of care:   Primary support name:  Mary Whitcher Primary support relationship to patient:  SIBLING Degree of support available:   unclear    CURRENT CONCERNS Current Concerns  Post-Acute Placement   Other Concerns:    SOCIAL WORK ASSESSMENT / PLAN Pt is a 77 yr old female living alone prior to hospitalization. CSW met with pt to assist with d/c planning. Pt will need ST Rehab following hospital d/c. Pt has requested SNF placement at [facility] ( [facility] ). SNF has been contacted and bed offer has been made. CSW will continue to follow to assist with d/c planning to SNF.   Assessment/plan status:  Psychosocial Support/Ongoing Assessment of Needs Other assessment/ plan:   Information/referral to community resources:   None needed at this time.    PATIENT'S/FAMILY'S RESPONSE TO PLAN OF CARE: Pt is looking forward to having rehab at [facility].    Cori Razor LCSW [phone]

## 2013-04-22 NOTE — Progress Notes (Signed)
Physical Therapy Treatment Patient Details Name: [name] [name] MRN: [id number] DOB: 08-31-1936 Today's Date: 04/22/2013 Time: 7846-9629 PT Time Calculation (min): 17 min  PT Assessment / Plan / Recommendation Comments on Treatment Session  Pt has become somewhat confused. Tolerated ther ex.    Follow Up Recommendations  SNF     Does the patient have the potential to tolerate intense rehabilitation     Barriers to Discharge Decreased caregiver support      Equipment Recommendations  None recommended by PT    Recommendations for Other Services    Frequency 7X/week   Plan Frequency remains appropriate;Discharge plan remains appropriate    Precautions / Restrictions Precautions Precautions: Knee Required Braces or Orthoses: Knee Immobilizer - Left   Pertinent Vitals/Pain No pain    Mobility    Exercises Total Joint Exercises Ankle Circles/Pumps: AROM;Both;10 reps;Supine Quad Sets: AAROM;Both;10 reps;Supine Heel Slides: AAROM;Left;10 reps;Supine Hip ABduction/ADduction: AAROM;Left;10 reps;Supine Straight Leg Raises: AAROM;Left;10 reps;Supine   PT Diagnosis: Difficulty walking;Acute pain  PT Problem List: Decreased strength;Decreased range of motion;Decreased activity tolerance;Decreased mobility;Decreased knowledge of precautions;Decreased safety awareness;Decreased knowledge of use of DME;Pain PT Treatment Interventions: DME instruction;Gait training;Functional mobility training;Therapeutic activities;Therapeutic exercise;Patient/family education   PT Goals Acute Rehab PT Goals PT Goal Formulation: With patient Time For Goal Achievement: 04/29/13 Potential to Achieve Goals: Good Pt will go Supine/Side to Sit: with supervision PT Goal: Supine/Side to Sit - Progress: Goal set today Pt will go Sit to Supine/Side: with supervision PT Goal: Sit to Supine/Side - Progress: Progressing toward goal Pt will go Sit to Stand: with supervision PT Goal: Sit to Stand - Progress:  Progressing toward goal Pt will go Stand to Sit: with supervision PT Goal: Stand to Sit - Progress: Progressing toward goal Pt will Ambulate: 51 - 150 feet;with supervision;with rolling walker PT Goal: Ambulate - Progress: Progressing toward goal Pt will Perform Home Exercise Program: with supervision, verbal cues required/provided PT Goal: Perform Home Exercise Program - Progress: Progressing toward goal  Visit Information  Last PT Received On: 04/22/13 Assistance Needed: +1    Subjective Data  Subjective: I am loopy Patient Stated Goal: to go to rehab   Cognition  Cognition Arousal/Alertness: Awake/alert Behavior During Therapy: Impulsive Overall Cognitive Status: Impaired/Different from baseline Area of Impairment: Attention;Safety/judgement General Comments: maybe due to medication    Balance     End of Session PT - End of Session Equipment Utilized During Treatment: Left knee immobilizer Activity Tolerance: Patient tolerated treatment well Patient left: in bed;with call bell/phone within reach;with bed alarm set Nurse Communication: Mobility status CPM Left Knee CPM Left Knee: On   GP     Rada Hay 04/22/2013, 3:44 PM

## 2013-04-22 NOTE — Progress Notes (Signed)
Physical Therapy Treatment Patient Details Name: [name] [name] MRN: [id number] DOB: 08-31-1936 Today's Date: 04/22/2013 Time: 1048-1100 PT Time Calculation (min): 12 min  PT Assessment / Plan / Recommendation Comments on Treatment Session  Pt. requires frequent cues for safety, pt is impulsive . continue PT.    Follow Up Recommendations  SNF     Does the patient have the potential to tolerate intense rehabilitation     Barriers to Discharge Decreased caregiver support      Equipment Recommendations  None recommended by PT    Recommendations for Other Services    Frequency 7X/week   Plan Frequency remains appropriate;Discharge plan remains appropriate    Precautions / Restrictions Precautions Precautions: Knee Required Braces or Orthoses: Knee Immobilizer - Left   Pertinent Vitals/Pain  no c/o    Mobility  Bed Mobility  Sit to Supine: 4: Min assist;HOB flat Transfers Transfers: Stand Pivot Transfers Sit to Stand: 4: Min assist;From chair/3-in-1 Stand to Sit: To bed;4: Min assist Stand Pivot Transfers: 4: Min assist Details for Transfer Assistance: cues formsafety, pt trying to stand before KI was secured, cues for use of UE to push from recliner and reach to bed.pt is impulsive. Assistive device: Rolling walker Ambulation/Gait Assistance Details: \  Pattern: Step-to pattern;Trunk flexed;Antalgic    Exercises     PT Diagnosis: Difficulty walking;Acute pain  PT Problem List: Decreased strength;Decreased range of motion;Decreased activity tolerance;Decreased mobility;Decreased knowledge of precautions;Decreased safety awareness;Decreased knowledge of use of DME;Pain PT Treatment Interventions: DME instruction;Gait training;Functional mobility training;Therapeutic activities;Therapeutic exercise;Patient/family education   PT Goals Acute Rehab PT Goals PT Goal Formulation: With patient Time For Goal Achievement: 04/29/13 Potential to Achieve Goals: Good Pt will go  Supine/Side to Sit: with supervision PT Goal: Supine/Side to Sit - Progress: Goal set today Pt will go Sit to Supine/Side: with supervision PT Goal: Sit to Supine/Side - Progress: Progressing toward goal Pt will go Sit to Stand: with supervision PT Goal: Sit to Stand - Progress: Progressing toward goal Pt will go Stand to Sit: with supervision PT Goal: Stand to Sit - Progress: Progressing toward goal Pt will Ambulate: 51 - 150 feet;with supervision;with rolling walker PT Goal: Ambulate - Progress: Progressing toward goal Pt will Perform Home Exercise Program: with supervision, verbal cues required/provided PT Goal: Perform Home Exercise Program - Progress: Goal set today  Visit Information  Last PT Received On: 04/22/13 Assistance Needed: +1    Subjective Data  Subjective: I need to take a nap Patient Stated Goal: to go to rehab   Cognition  Cognition Arousal/Alertness: Awake/alert Behavior During Therapy: WFL for tasks assessed/performed Overall Cognitive Status: Within Functional Limits for tasks assessed    Balance     End of Session PT - End of Session Equipment Utilized During Treatment: Left knee immobilizer Activity Tolerance: Patient tolerated treatment well Patient left: in bed;with call bell/phone within reach Nurse Communication: Mobility status   GP     Rada Hay 04/22/2013, 12:46 PM

## 2013-04-22 NOTE — Progress Notes (Signed)
Clinical Social Work Department CLINICAL SOCIAL WORK PLACEMENT NOTE 04/22/2013  Patient:  [name] [name], [name] [name]  Account Number:  [id number] Admit date:  04/21/2013  Clinical Social Worker:  Cori Razor, LCSW  Date/time:  04/22/2013 12:16 PM  Clinical Social Work is seeking post-discharge placement for this patient at the following level of care:   SKILLED NURSING   (*CSW will update this form in Epic as items are completed)     Patient/family provided with [facility] Department of Clinical Social Work's list of facilities offering this level of care within the geographic area requested by the patient (or if unable, by the patient's family).  04/22/2013  Patient/family informed of their freedom to choose among providers that offer the needed level of care, that participate in Medicare, Medicaid or managed care program needed by the patient, have an available bed and are willing to accept the patient.    Patient/family informed of [facility]' ownership interest in [facility], as well as of the fact that they are under no obligation to receive care at this facility.  PASARR submitted to [employer] on  PASARR number received from [employer] on 07/27/2010  FL2 transmitted to all facilities in geographic area requested by pt/family on  04/22/2013 FL2 transmitted to all facilities within larger geographic area on   Patient informed that his/her managed care company has contracts with or will negotiate with  certain facilities, including the following:     Patient/family informed of bed offers received:  04/22/2013 Patient chooses bed at [facility], [place] Physician recommends and patient chooses bed at    [facility] [place] [facility]   Patient to be transferred to facility by   The following physician request were entered in Epic:   Additional Comments:  Cori Razor LCSW [phone]

## 2013-04-22 NOTE — Progress Notes (Signed)
Utilization review completed.  

## 2013-04-22 NOTE — Evaluation (Signed)
Physical Therapy Evaluation Patient Details Name: [name] [name] MRN: [id number] DOB: [birth year] Today's Date: 04/22/2013 Time: 6295-2841 PT Time Calculation (min): 26 min  PT Assessment / Plan / Recommendation Clinical Impression  77 yo female admitted 04/21/13 for LTKA. Pt tolerated ambulating 50'. Pt plans to go to snf rehab. Pt will benefit from PT while in acute care to improve in functional mobility and strength,ROM.    PT Assessment  Patient needs continued PT services    Follow Up Recommendations  SNF    Does the patient have the potential to tolerate intense rehabilitation      Barriers to Discharge Decreased caregiver support      Equipment Recommendations  None recommended by PT    Recommendations for Other Services     Frequency 7X/week    Precautions / Restrictions Precautions Precautions: Knee Required Braces or Orthoses: Knee Immobilizer - Left   Pertinent Vitals/Pain Very little discomfort repoted/      Mobility  Bed Mobility Bed Mobility: Supine to Sit;Sit to Supine Supine to Sit: 4: Min assist;HOB elevated;With rails Transfers Transfers: Sit to Stand;Stand to Sit Sit to Stand: 1: +2 Total assist;From bed;With upper extremity assist Stand to Sit: 3: Mod assist;To chair/3-in-1 Details for Transfer Assistance: cues for hand placement and to push from bed although pt did not do this, cues to place LLe forward prior to sitting down, Ambulation/Gait Ambulation/Gait Assistance: 1: +2 Total assist Ambulation/Gait: Patient Percentage: 80% Ambulation Distance (Feet): 50 Feet Assistive device: Rolling walker Ambulation/Gait Assistance Details: cus for posture, step length amd position inside RW. Pt. c/o dizziness so chair brought up. Gait Pattern: Step-to pattern;Trunk flexed;Antalgic    Exercises     PT Diagnosis: Difficulty walking;Acute pain  PT Problem List: Decreased strength;Decreased range of motion;Decreased activity tolerance;Decreased  mobility;Decreased knowledge of precautions;Decreased safety awareness;Decreased knowledge of use of DME;Pain PT Treatment Interventions: DME instruction;Gait training;Functional mobility training;Therapeutic activities;Therapeutic exercise;Patient/family education   PT Goals Acute Rehab PT Goals PT Goal Formulation: With patient Time For Goal Achievement: 04/29/13 Potential to Achieve Goals: Good Pt will go Supine/Side to Sit: with supervision PT Goal: Supine/Side to Sit - Progress: Goal set today Pt will go Sit to Supine/Side: with supervision PT Goal: Sit to Supine/Side - Progress: Goal set today Pt will go Sit to Stand: with supervision PT Goal: Sit to Stand - Progress: Goal set today Pt will go Stand to Sit: with supervision PT Goal: Stand to Sit - Progress: Goal set today Pt will Ambulate: 51 - 150 feet;with supervision;with rolling walker PT Goal: Ambulate - Progress: Goal set today Pt will Perform Home Exercise Program: with supervision, verbal cues required/provided PT Goal: Perform Home Exercise Program - Progress: Goal set today  Visit Information  Last PT Received On: 04/22/13 Assistance Needed: +2    Subjective Data  Subjective: I am ready to walk Patient Stated Goal: to go to rehab   Prior Functioning  Home Living Lives With: Alone Type of Home: Skilled Nursing Facility Home Adaptive Equipment: Straight cane Prior Function Level of Independence: Independent with assistive device(s) Able to Take Stairs?: Yes Driving: Yes Communication Communication: No difficulties    Cognition  Cognition Arousal/Alertness: Awake/alert Behavior During Therapy: WFL for tasks assessed/performed Overall Cognitive Status: Within Functional Limits for tasks assessed    Extremity/Trunk Assessment Right Lower Extremity Assessment RLE ROM/Strength/Tone: Within functional levels RLE Sensation: WFL - Light Touch Left Lower Extremity Assessment LLE ROM/Strength/Tone: Deficits LLE  ROM/Strength/Tone Deficits: assistance for moving LE to edge of bed, knee  flexionto about 35. LLE Sensation: WFL - Light Touch   Balance    End of Session PT - End of Session Equipment Utilized During Treatment: Left knee immobilizer Activity Tolerance: Patient limited by fatigue Patient left: in chair;with call bell/phone within reach Nurse Communication: Mobility status  GP     Rada Hay 04/22/2013, 12:37 PM  Blanchard Kelch PT [phone]

## 2013-04-23 LAB — BASIC METABOLIC PANEL WITH GFR
BUN: 14 mg/dL (ref 6–23)
CO2: 32 meq/L (ref 19–32)
Calcium: 9.1 mg/dL (ref 8.4–10.5)
Chloride: 100 meq/L (ref 96–112)
Creatinine, Ser: 0.75 mg/dL (ref 0.50–1.10)
GFR calc Af Amer: >90 mL/min (ref >90)
GFR calc non Af Amer: 81 mL/min — ABNORMAL LOW (ref >90)
Glucose, Bld: 151 mg/dL — ABNORMAL HIGH (ref 70–99)
Potassium: 3.5 meq/L (ref 3.5–5.1)
Sodium: 138 meq/L (ref 135–145)

## 2013-04-23 LAB — CBC
Platelets: 134 10*3/uL — ABNORMAL LOW (ref 150–400)
RBC: 3.56 MIL/uL — ABNORMAL LOW (ref 3.87–5.11)
RDW: 13.5 % (ref 11.5–15.5)
WBC: 14.2 10*3/uL — ABNORMAL HIGH (ref 4.0–10.5)

## 2013-04-23 NOTE — Progress Notes (Signed)
Physical Therapy Treatment Patient Details Name: [name] [name] MRN: [id number] DOB: September 13, 1936 Today's Date: 04/23/2013 Time: 4098-1191 PT Time Calculation (min): 25 min  PT Assessment / Plan / Recommendation Comments on Treatment Session  Pt continues to progress with mobility.     Follow Up Recommendations  SNF     Does the patient have the potential to tolerate intense rehabilitation     Barriers to Discharge        Equipment Recommendations  None recommended by PT    Recommendations for Other Services    Frequency 7X/week   Plan Frequency remains appropriate;Discharge plan remains appropriate    Precautions / Restrictions Precautions Precautions: Knee Required Braces or Orthoses: Knee Immobilizer - Left Knee Immobilizer - Left: Discontinue once straight leg raise with < 10 degree lag Restrictions Weight Bearing Restrictions: No Other Position/Activity Restrictions: WBAT   Pertinent Vitals/Pain     Mobility  Bed Mobility Bed Mobility: Supine to Sit;Sit to Supine Supine to Sit: 4: Min assist;HOB elevated Sit to Supine: 4: Min assist;HOB flat Details for Bed Mobility Assistance: Assist for LLE into and out of bed with cues for hand placement and technique.  Transfers Transfers: Sit to Stand;Stand to Sit Sit to Stand: 4: Min assist;From elevated surface;With upper extremity assist;From bed Stand to Sit: 4: Min assist;With upper extremity assist;With armrests;To chair/3-in-1 Details for Transfer Assistance: Assist to rise, steady and ensure controlled descent with cues for hand placement and LE managemetn.  Ambulation/Gait Ambulation/Gait Assistance: 4: Min guard Ambulation Distance (Feet): 120 Feet Assistive device: Rolling walker Gait Pattern: Step-to pattern;Trunk flexed;Antalgic    Exercises     PT Diagnosis:    PT Problem List:   PT Treatment Interventions:     PT Goals Acute Rehab PT Goals PT Goal Formulation: With patient Time For Goal  Achievement: 04/29/13 Potential to Achieve Goals: Good Pt will go Supine/Side to Sit: with supervision PT Goal: Supine/Side to Sit - Progress: Progressing toward goal Pt will go Sit to Supine/Side: with supervision PT Goal: Sit to Supine/Side - Progress: Progressing toward goal Pt will go Sit to Stand: with supervision PT Goal: Sit to Stand - Progress: Progressing toward goal Pt will go Stand to Sit: with supervision PT Goal: Stand to Sit - Progress: Progressing toward goal Pt will Ambulate: 51 - 150 feet;with supervision;with rolling walker PT Goal: Ambulate - Progress: Progressing toward goal  Visit Information  Last PT Received On: 04/23/13 Assistance Needed: +1    Subjective Data  Subjective: I do better everytime I move.  Patient Stated Goal: to go to rehab   Cognition  Cognition Arousal/Alertness: Awake/alert Behavior During Therapy: WFL for tasks assessed/performed Overall Cognitive Status: Within Functional Limits for tasks assessed General Comments: much improved today    Balance     End of Session PT - End of Session Equipment Utilized During Treatment: Left knee immobilizer Activity Tolerance: Patient tolerated treatment well Patient left: in chair;with call bell/phone within reach Nurse Communication: Mobility status CPM Left Knee CPM Left Knee: Off   GP     Vista Deck 04/23/2013, 5:55 PM

## 2013-04-23 NOTE — Progress Notes (Signed)
   Subjective: 2 Days Post-Op Procedure(s) (LRB): LEFT TOTAL KNEE ARTHROPLASTY (Left) Patient reports pain as mild.   Patient seen in rounds with Dr. Lequita Halt. Patient is well, and has had no acute complaints or problems Plan is to go Skilled nursing facility after hospital stay.  Objective: Vital signs in last 24 hours: Temp:  [98 F (36.7 C)-99 F (37.2 C)] 98 F (36.7 C) (05/07 0528) Pulse Rate:  [78-89] 88 (05/07 0858) Resp:  [14-18] 18 (05/07 0858) BP: (107-146)/(72-86) 146/78 mmHg (05/07 0735) SpO2:  [92 %-100 %] 100 % (05/07 0900)  Intake/Output from previous day:  Intake/Output Summary (Last 24 hours) at 04/23/13 1133 Last data filed at 04/23/13 1000  Gross per 24 hour  Intake   1420 ml  Output   1325 ml  Net     95 ml    Intake/Output this shift: Total I/O In: 320 [P.O.:240; I.V.:80] Out: 300 [Urine:300]  Labs:  Recent Labs  04/22/13 0451 04/23/13 0503  HGB 10.4* 10.4*    Recent Labs  04/22/13 0451 04/23/13 0503  WBC 8.4 14.2*  RBC 3.58* 3.56*  HCT 32.1* 31.7*  PLT 136* 134*    Recent Labs  04/22/13 0451 04/23/13 0503  NA 139 138  K 3.3* 3.5  CL 101 100  CO2 33* 32  BUN 9 14  CREATININE 0.79 0.75  GLUCOSE 112* 151*  CALCIUM 8.2* 9.1   No results found for this basename: LABPT, INR,  in the last 72 hours  EXAM General - Patient is Alert, Appropriate and Oriented Extremity - Neurovascular intact Sensation intact distally Dorsiflexion/Plantar flexion intact No cellulitis present Dressing/Incision - clean, dry, no drainage, healing Motor Function - intact, moving foot and toes well on exam.   Past Medical History  Diagnosis Date  . Allergic rhinitis   . Sarcoidosis   . Pulmonary hypertension   . Asthmatic bronchitis   . Pulmonary embolism   . Hypertension   . CAD (coronary artery disease)   . Cardiac pacemaker in situ   . Venous insufficiency   . Hyperlipidemia   . Obesity   . PUD (peptic ulcer disease)   . Hemorrhoids     . Renal insufficiency   . UTI (lower urinary tract infection)   . DJD (degenerative joint disease)   . Cervicalgia   . Lumbar back pain   . Anxiety   . Myocardial infarction 2012  . CHF (congestive heart failure) 2012  . COPD (chronic obstructive pulmonary disease)   . Shortness of breath     OCCASIONAL  . GERD (gastroesophageal reflux disease)   . Cancer     VULVAR CANCER  . Pacemaker   . Stroke 2006    balance problems remain  . OSA (obstructive sleep apnea)     USES O2 AT HOME WITH NASAL MASK    Assessment/Plan: 2 Days Post-Op Procedure(s) (LRB): LEFT TOTAL KNEE ARTHROPLASTY (Left) Principal Problem:   OA (osteoarthritis) of knee Active Problems:   Postoperative anemia due to acute blood loss   Postop Hypokalemia  Estimated body mass index is 29.59 kg/(m^2) as calculated from the following:   Height as of this encounter: 5' 4.5" (1.638 m).   Weight as of this encounter: 79.379 kg (175 lb). Up with therapy Discharge to SNF  DVT Prophylaxis - Xarelto Weight-Bearing as tolerated to left leg  [name] [name] 04/23/2013, 11:33 AM

## 2013-04-23 NOTE — Evaluation (Signed)
Occupational Therapy Evaluation Patient Details Name: [name] [name] MRN: [id number] DOB: 1936-01-08 Today's Date: 04/23/2013 Time: 4098-1191    OT Assessment / Plan / Recommendation Clinical Impression  Pt presents to OT with decreased I with ADL activity s/p TKR. Pt will benefit from sklilled OT to increase I with ADL activity and return to PLOF    OT Assessment  Patient needs continued OT Services    Follow Up Recommendations  SNF       Equipment Recommendations  None recommended by OT       Frequency  Min 2X/week    Precautions / Restrictions Precautions Precautions: Knee Required Braces or Orthoses: Knee Immobilizer - Left Knee Immobilizer - Left: Discontinue once straight leg raise with < 10 degree lag Restrictions Weight Bearing Restrictions: No Other Position/Activity Restrictions: WBAT       ADL  Grooming: Performed;Minimal assistance Where Assessed - Grooming: Unsupported standing Lower Body Dressing: Performed;Moderate assistance Where Assessed - Lower Body Dressing: Unsupported sit to stand Toilet Transfer: Performed;Minimal assistance Toilet Transfer Method: Sit to stand Toilet Transfer Equipment: Comfort height toilet Toileting - Clothing Manipulation and Hygiene: Performed;Minimal assistance Where Assessed - Glass blower/designer Manipulation and Hygiene: Standing    OT Diagnosis: Generalized weakness  OT Problem List: Decreased strength;Decreased activity tolerance OT Treatment Interventions: Self-care/ADL training;Patient/family education;DME and/or AE instruction   OT Goals Acute Rehab OT Goals OT Goal Formulation: With patient Time For Goal Achievement: 05/07/13 ADL Goals Pt Will Perform Lower Body Dressing: with modified independence;Sit to stand from chair ADL Goal: Lower Body Dressing - Progress: Goal set today Pt Will Transfer to Toilet: with modified independence;Comfort height toilet ADL Goal: Toilet Transfer - Progress: Goal set  today Pt Will Perform Toileting - Clothing Manipulation: with modified independence;Standing ADL Goal: Toileting - Clothing Manipulation - Progress: Goal set today  Visit Information  Last OT Received On: 04/23/13 Assistance Needed: +1    Subjective Data  Subjective: i want to go to [facility] for rehab   Prior Functioning     Home Living Lives With: Alone Type of Home: Skilled Nursing Facility Home Adaptive Equipment: Straight cane Prior Function Level of Independence: Independent with assistive device(s) Able to Take Stairs?: Yes Driving: Yes Communication Communication: No difficulties         Vision/Perception Vision - History Baseline Vision: Wears glasses only for reading Patient Visual Report: No change from baseline   Cognition  Cognition Arousal/Alertness: Awake/alert Behavior During Therapy: WFL for tasks assessed/performed Overall Cognitive Status: Within Functional Limits for tasks assessed General Comments: much improved today    Extremity/Trunk Assessment Right Upper Extremity Assessment RUE ROM/Strength/Tone: [facility] for tasks assessed Left Upper Extremity Assessment LUE ROM/Strength/Tone: WFL for tasks assessed     Mobility Bed Mobility Bed Mobility: Supine to Sit Supine to Sit: 4: Min assist;HOB elevated Details for Bed Mobility Assistance: Assist for LLE out of bed with cues for hand placement and technique.  Transfers Transfers: Sit to Stand;Stand to Sit Sit to Stand: From toilet;4: Min assist;With upper extremity assist Stand to Sit: To chair/3-in-1;4: Min assist;Without upper extremity assist Details for Transfer Assistance: Assist to rise, steady and ensure controlled descent with cues for hand placement and LE managemetn.      Exercise Total Joint Exercises Ankle Circles/Pumps: AROM;Both;15 reps Quad Sets: AAROM;Both;10 reps Straight Leg Raises: AAROM;Left;10 reps      End of Session OT - End of Session Activity Tolerance: Patient  tolerated treatment well Patient left: in chair;with call bell/phone within reach CPM Left  Knee CPM Left Knee: Off  GO     Alba Cory 04/23/2013, 11:36 AM

## 2013-04-23 NOTE — Progress Notes (Signed)
Physical Therapy Treatment Patient Details Name: SAHARA FUJIMOTO MRN: [id number] DOB: 1936-12-08 Today's Date: 04/23/2013 Time: 0931-1004 PT Time Calculation (min): 33 min  PT Assessment / Plan / Recommendation Comments on Treatment Session  Pts cognition much improved from yesterday and she states that she doesn't feel as "loopy.'  Able to increase ambulation and perform some exercises.     Follow Up Recommendations  SNF     Does the patient have the potential to tolerate intense rehabilitation     Barriers to Discharge        Equipment Recommendations  None recommended by PT    Recommendations for Other Services    Frequency 7X/week   Plan Frequency remains appropriate;Discharge plan remains appropriate    Precautions / Restrictions Precautions Precautions: Knee Required Braces or Orthoses: Knee Immobilizer - Left Knee Immobilizer - Left: Discontinue once straight leg raise with < 10 degree lag Restrictions Weight Bearing Restrictions: No Other Position/Activity Restrictions: WBAT   Pertinent Vitals/Pain 3/10 pain, ice packs applied    Mobility  Bed Mobility Bed Mobility: Supine to Sit Supine to Sit: 4: Min assist;HOB elevated Details for Bed Mobility Assistance: Assist for LLE out of bed with cues for hand placement and technique.  Transfers Transfers: Sit to Stand;Stand to Sit Sit to Stand: 4: Min assist;From elevated surface;With upper extremity assist;From bed Stand to Sit: 4: Min assist;With upper extremity assist;With armrests;To chair/3-in-1 Details for Transfer Assistance: Assist to rise, steady and ensure controlled descent with cues for hand placement and LE managemetn.  Ambulation/Gait Ambulation/Gait Assistance: 4: Min assist Ambulation Distance (Feet): 90 Feet Assistive device: Rolling walker Ambulation/Gait Assistance Details: Pt less impulsive today and aware that she needs to take smaller steps.  She still requires cues for maintaining proper  position inside of RW as she turns, but overall improved from yesterday.  Gait Pattern: Step-to pattern;Trunk flexed;Antalgic    Exercises Total Joint Exercises Ankle Circles/Pumps: AROM;Both;15 reps Quad Sets: AAROM;Both;10 reps Straight Leg Raises: AAROM;Left;10 reps   PT Diagnosis:    PT Problem List:   PT Treatment Interventions:     PT Goals Acute Rehab PT Goals PT Goal Formulation: With patient Time For Goal Achievement: 04/29/13 Potential to Achieve Goals: Good Pt will go Supine/Side to Sit: with supervision PT Goal: Supine/Side to Sit - Progress: Progressing toward goal Pt will go Sit to Stand: with supervision PT Goal: Sit to Stand - Progress: Progressing toward goal Pt will go Stand to Sit: with supervision PT Goal: Stand to Sit - Progress: Progressing toward goal Pt will Ambulate: 51 - 150 feet;with supervision;with rolling walker PT Goal: Ambulate - Progress: Progressing toward goal Pt will Perform Home Exercise Program: with supervision, verbal cues required/provided PT Goal: Perform Home Exercise Program - Progress: Progressing toward goal  Visit Information  Last PT Received On: 04/23/13 Assistance Needed: +1    Subjective Data  Subjective: I'm not as loopy today.  I had too much pain medicine yesterday.  Patient Stated Goal: to go to rehab   Cognition  Cognition Arousal/Alertness: Awake/alert Behavior During Therapy: WFL for tasks assessed/performed Overall Cognitive Status: Within Functional Limits for tasks assessed General Comments: much improved today    Balance     End of Session PT - End of Session Equipment Utilized During Treatment: Left knee immobilizer Activity Tolerance: Patient tolerated treatment well Patient left: in chair;with call bell/phone within reach Nurse Communication: Mobility status CPM Left Knee CPM Left Knee: Off   GP     Jaryah Aracena, Irving Burton  Ann 04/23/2013, 10:41 AM

## 2013-04-24 LAB — CBC
HCT: 29.6 % — ABNORMAL LOW (ref 36.0–46.0)
MCV: 89.4 fL (ref 78.0–100.0)
Platelets: 146 10*3/uL — ABNORMAL LOW (ref 150–400)
RBC: 3.31 MIL/uL — ABNORMAL LOW (ref 3.87–5.11)
WBC: 13.1 10*3/uL — ABNORMAL HIGH (ref 4.0–10.5)

## 2013-04-24 MED ORDER — BISACODYL 10 MG RE SUPP: 10.0000 mg | Freq: Every day | RECTAL | Status: DC | PRN

## 2013-04-24 MED ORDER — POLYETHYLENE GLYCOL 3350 17 G PO PACK: 17.0000 g | PACK | Freq: Every day | ORAL | Status: DC | PRN

## 2013-04-24 MED ORDER — ACETAMINOPHEN 325 MG PO TABS: 650.0000 mg | ORAL_TABLET | Freq: Four times a day (QID) | ORAL | Status: DC | PRN

## 2013-04-24 MED ORDER — DIPHENHYDRAMINE HCL 12.5 MG/5ML PO ELIX: 12.5000 mg | ORAL_SOLUTION | ORAL | Status: DC | PRN

## 2013-04-24 MED ORDER — METOCLOPRAMIDE HCL 5 MG PO TABS: 5.0000 mg | ORAL_TABLET | Freq: Three times a day (TID) | ORAL | Status: DC | PRN

## 2013-04-24 MED ORDER — OXYCODONE HCL 5 MG PO TABS: 5.0000 mg | ORAL_TABLET | ORAL | Status: DC | PRN

## 2013-04-24 MED ORDER — TRAMADOL HCL 50 MG PO TABS: 50.0000 mg | ORAL_TABLET | Freq: Four times a day (QID) | ORAL | Status: DC | PRN

## 2013-04-24 MED ORDER — ONDANSETRON HCL 4 MG PO TABS: 4.0000 mg | ORAL_TABLET | Freq: Four times a day (QID) | ORAL | Status: DC | PRN

## 2013-04-24 MED ORDER — METHOCARBAMOL 500 MG PO TABS: 500.0000 mg | ORAL_TABLET | Freq: Four times a day (QID) | ORAL | Status: DC | PRN

## 2013-04-24 NOTE — Discharge Summary (Signed)
Physician Discharge Summary   Patient ID: [name] [name] MRN: [id number] DOB/AGE: 1936-02-26 77 y.o.  Admit date: 04/21/2013 Discharge date: 04/24/2013  Primary Diagnosis:  Osteoarthritis Left knee  Admission Diagnoses:  Past Medical History  Diagnosis Date  . Allergic rhinitis   . Sarcoidosis   . Pulmonary hypertension   . Asthmatic bronchitis   . Pulmonary embolism   . Hypertension   . CAD (coronary artery disease)   . Cardiac pacemaker in situ   . Venous insufficiency   . Hyperlipidemia   . Obesity   . PUD (peptic ulcer disease)   . Hemorrhoids   . Renal insufficiency   . UTI (lower urinary tract infection)   . DJD (degenerative joint disease)   . Cervicalgia   . Lumbar back pain   . Anxiety   . Myocardial infarction 2012  . CHF (congestive heart failure) 2012  . COPD (chronic obstructive pulmonary disease)   . Shortness of breath     OCCASIONAL  . GERD (gastroesophageal reflux disease)   . Cancer     VULVAR CANCER  . Pacemaker   . Stroke 2006    balance problems remain  . OSA (obstructive sleep apnea)     USES O2 AT HOME WITH NASAL MASK   Discharge Diagnoses:   Principal Problem:   OA (osteoarthritis) of knee Active Problems:   Postoperative anemia due to acute blood loss   Postop Hypokalemia  Estimated body mass index is 29.59 kg/(m^2) as calculated from the following:   Height as of this encounter: 5' 4.5" (1.638 m).   Weight as of this encounter: 79.379 kg (175 lb).  Procedure:  Procedure(s) (LRB): LEFT TOTAL KNEE ARTHROPLASTY (Left)   Consults: None  HPI: [name] [name] is a 77 y.o. year old female with end stage OA of her left knee with progressively worsening pain and dysfunction. She has constant pain, with activity and at rest and significant functional deficits with difficulties even with ADLs. She has had extensive non-op management including analgesics, injections of cortisone and viscosupplements, and home exercise program, but remains  in significant pain with significant dysfunction. Radiographs show bone on bone arthritis all 3 compartments. She presents now for left Total Knee Arthroplasty.   Laboratory Data: Admission on 04/21/2013  Component Date Value Range Status  . WBC 04/22/2013 8.4  4.0 - 10.5 K/uL Final  . RBC 04/22/2013 3.58* 3.87 - 5.11 MIL/uL Final  . Hemoglobin 04/22/2013 10.4* 12.0 - 15.0 g/dL Final  . HCT 95/28/4132 32.1* 36.0 - 46.0 % Final  . MCV 04/22/2013 89.7  78.0 - 100.0 fL Final  . MCH 04/22/2013 29.1  26.0 - 34.0 pg Final  . MCHC 04/22/2013 32.4  30.0 - 36.0 g/dL Final  . RDW 44/12/270 13.6  11.5 - 15.5 % Final  . Platelets 04/22/2013 136* 150 - 400 K/uL Final  . Sodium 04/22/2013 139  135 - 145 mEq/L Final  . Potassium 04/22/2013 3.3* 3.5 - 5.1 mEq/L Final  . Chloride 04/22/2013 101  96 - 112 mEq/L Final  . CO2 04/22/2013 33* 19 - 32 mEq/L Final  . Glucose, Bld 04/22/2013 112* 70 - 99 mg/dL Final  . BUN 53/66/4403 9  6 - 23 mg/dL Final  . Creatinine, Ser 04/22/2013 0.79  0.50 - 1.10 mg/dL Final  . Calcium 47/42/5956 8.2* 8.4 - 10.5 mg/dL Final  . GFR calc non Af Amer 04/22/2013 80* >90 mL/min Final  . GFR calc Af Amer 04/22/2013 >90  >90 mL/min Final  Comment:                                 The eGFR has been calculated                          using the CKD EPI equation.                          This calculation has not been                          validated in all clinical                          situations.                          eGFR's persistently                          <90 mL/min signify                          possible Chronic Kidney Disease.  . WBC 04/23/2013 14.2* 4.0 - 10.5 K/uL Final  . RBC 04/23/2013 3.56* 3.87 - 5.11 MIL/uL Final  . Hemoglobin 04/23/2013 10.4* 12.0 - 15.0 g/dL Final  . HCT 14/78/2956 31.7* 36.0 - 46.0 % Final  . MCV 04/23/2013 89.0  78.0 - 100.0 fL Final  . MCH 04/23/2013 29.2  26.0 - 34.0 pg Final  . MCHC 04/23/2013 32.8  30.0 - 36.0 g/dL  Final  . RDW 21/30/8657 13.5  11.5 - 15.5 % Final  . Platelets 04/23/2013 134* 150 - 400 K/uL Final  . Sodium 04/23/2013 138  135 - 145 mEq/L Final  . Potassium 04/23/2013 3.5  3.5 - 5.1 mEq/L Final  . Chloride 04/23/2013 100  96 - 112 mEq/L Final  . CO2 04/23/2013 32  19 - 32 mEq/L Final  . Glucose, Bld 04/23/2013 151* 70 - 99 mg/dL Final  . BUN 84/69/6295 14  6 - 23 mg/dL Final  . Creatinine, Ser 04/23/2013 0.75  0.50 - 1.10 mg/dL Final  . Calcium 28/41/3244 9.1  8.4 - 10.5 mg/dL Final  . GFR calc non Af Amer 04/23/2013 81* >90 mL/min Final  . GFR calc Af Amer 04/23/2013 >90  >90 mL/min Final   Comment:                                 The eGFR has been calculated                          using the CKD EPI equation.                          This calculation has not been                          validated in all clinical                          situations.  eGFR's persistently                          <90 mL/min signify                          possible Chronic Kidney Disease.  . WBC 04/24/2013 13.1* 4.0 - 10.5 K/uL Final  . RBC 04/24/2013 3.31* 3.87 - 5.11 MIL/uL Final  . Hemoglobin 04/24/2013 9.5* 12.0 - 15.0 g/dL Final  . HCT 13/07/6577 29.6* 36.0 - 46.0 % Final  . MCV 04/24/2013 89.4  78.0 - 100.0 fL Final  . MCH 04/24/2013 28.7  26.0 - 34.0 pg Final  . MCHC 04/24/2013 32.1  30.0 - 36.0 g/dL Final  . RDW 46/96/2952 13.8  11.5 - 15.5 % Final  . Platelets 04/24/2013 146* 150 - 400 K/uL Final  Hospital Outpatient Visit on 04/15/2013  Component Date Value Range Status  . aPTT 04/15/2013 34  24 - 37 seconds Final  . WBC 04/15/2013 6.7  4.0 - 10.5 K/uL Final  . RBC 04/15/2013 4.38  3.87 - 5.11 MIL/uL Final  . Hemoglobin 04/15/2013 12.8  12.0 - 15.0 g/dL Final  . HCT 84/13/2440 39.4  36.0 - 46.0 % Final  . MCV 04/15/2013 90.0  78.0 - 100.0 fL Final  . MCH 04/15/2013 29.2  26.0 - 34.0 pg Final  . MCHC 04/15/2013 32.5  30.0 - 36.0 g/dL Final  . RDW  10/14/2535 13.3  11.5 - 15.5 % Final  . Platelets 04/15/2013 206  150 - 400 K/uL Final  . Sodium 04/15/2013 141  135 - 145 mEq/L Final  . Potassium 04/15/2013 4.2  3.5 - 5.1 mEq/L Final  . Chloride 04/15/2013 102  96 - 112 mEq/L Final  . CO2 04/15/2013 31  19 - 32 mEq/L Final  . Glucose, Bld 04/15/2013 100* 70 - 99 mg/dL Final  . BUN 64/40/3474 22  6 - 23 mg/dL Final  . Creatinine, Ser 04/15/2013 0.87  0.50 - 1.10 mg/dL Final  . Calcium 25/95/6387 9.5  8.4 - 10.5 mg/dL Final  . Total Protein 04/15/2013 8.1  6.0 - 8.3 g/dL Final  . Albumin 56/43/3295 3.8  3.5 - 5.2 g/dL Final  . AST 18/84/1660 26  0 - 37 U/L Final  . ALT 04/15/2013 12  0 - 35 U/L Final  . Alkaline Phosphatase 04/15/2013 57  39 - 117 U/L Final  . Total Bilirubin 04/15/2013 0.5  0.3 - 1.2 mg/dL Final  . GFR calc non Af Amer 04/15/2013 64* >90 mL/min Final  . GFR calc Af Amer 04/15/2013 74* >90 mL/min Final   Comment:                                 The eGFR has been calculated                          using the CKD EPI equation.                          This calculation has not been                          validated in all clinical  situations.                          eGFR's persistently                          <90 mL/min signify                          possible Chronic Kidney Disease.  [name] Kitchen Prothrombin Time 04/15/2013 15.0  11.6 - 15.2 seconds Final  . INR 04/15/2013 1.20  0.00 - 1.49 Final  . ABO/RH(D) 04/15/2013 A POS   Final  . Antibody Screen 04/15/2013 NEG   Final  . Sample Expiration 04/15/2013 04/24/2013   Final  . Color, Urine 04/15/2013 YELLOW  YELLOW Final  . APPearance 04/15/2013 CLEAR  CLEAR Final  . Specific Gravity, Urine 04/15/2013 1.014  1.005 - 1.030 Final  . pH 04/15/2013 6.5  5.0 - 8.0 Final  . Glucose, UA 04/15/2013 NEGATIVE  NEGATIVE mg/dL Final  . Hgb urine dipstick 04/15/2013 NEGATIVE  NEGATIVE Final  . Bilirubin Urine 04/15/2013 NEGATIVE  NEGATIVE Final  . Ketones,  ur 04/15/2013 NEGATIVE  NEGATIVE mg/dL Final  . Protein, ur 59/56/3875 NEGATIVE  NEGATIVE mg/dL Final  . Urobilinogen, UA 04/15/2013 0.2  0.0 - 1.0 mg/dL Final  . Nitrite 64/33/2951 POSITIVE* NEGATIVE Final  . Leukocytes, UA 04/15/2013 MODERATE* NEGATIVE Final  . Specimen Description 04/15/2013 URINE, CLEAN CATCH   Final  . Special Requests 04/15/2013 NONE   Final  . Culture  Setup Time 04/15/2013 04/15/2013 18:47   Final  . Colony Count 04/15/2013 >=100,000 COLONIES/ML   Final  . Culture 04/15/2013 ESCHERICHIA COLI   Final  . Report Status 04/15/2013 04/17/2013 FINAL   Final  . Organism ID, Bacteria 04/15/2013 ESCHERICHIA COLI   Final  . Squamous Epithelial / LPF 04/15/2013 RARE  RARE Final  . WBC, UA 04/15/2013 3-6  <3 WBC/hpf Final  . Bacteria, UA 04/15/2013 MANY* RARE Final  . MRSA, PCR 04/15/2013 NEGATIVE  NEGATIVE Final  . Staphylococcus aureus 04/15/2013 NEGATIVE  NEGATIVE Final   Comment:                                 The Xpert SA Assay (FDA                          approved for NASAL specimens                          in patients over 26 years of age),                          is one component of                          a comprehensive surveillance                          program.  Test performance has                          been validated by [employer] [facility] for patients greater                          than or equal to 25 year old.                          It is not intended                          to diagnose infection nor to                          guide or monitor treatment.  . ABO/RH(D) 04/15/2013 A POS   Final     X-Rays:No results found.  EKG: Orders placed in visit on 04/15/13  . EKG 12-LEAD     Hospital Course: KHALIAH BARNICK is a 77 y.o. who was admitted to [facility] They were brought to the operating room on 04/21/2013 and underwent Procedure(s): LEFT TOTAL KNEE ARTHROPLASTY.  Patient tolerated the procedure well  and was later transferred to the recovery room and then to the orthopaedic floor for postoperative care.  They were given PO and IV analgesics for pain control following their surgery.  They were given 24 hours of postoperative antibiotics of  Anti-infectives   Start     Dose/Rate Route Frequency Ordered Stop   04/21/13 1930  ceFAZolin (ANCEF) IVPB 1 g/50 mL premix     1 g 100 mL/hr over 30 Minutes Intravenous Every 6 hours 04/21/13 1640 04/22/13 0149   04/21/13 1000  ceFAZolin (ANCEF) IVPB 2 g/50 mL premix    Comments:  Dose reduced to 2g per P&T policy for weight < 120kg (weight 85kg)   2 g 100 mL/hr over 30 Minutes Intravenous On call to O.R. 04/21/13 0945 04/21/13 1301     and started on DVT prophylaxis in the form of Xarelto.   PT and OT were ordered for total joint protocol.  Discharge planning consulted to help with postop disposition and equipment needs.  Patient had a decent night on the evening of surgery. Patient wanted to look into SNF. They started to get up OOB with therapy on day one. Hemovac drain was pulled without difficulty.  Continued to work with therapy into day two.  Dressing was changed on day two and the incision was healing well.  By day three, the patient had progressed with therapy and meeting their goals.  Incision was healing well.  Patient was seen in rounds and was ready to go to the SNF, [facility].[name] Kitchen   Discharge Medications: Prior to Admission medications   Medication Sig Start Date End Date Taking? Authorizing Provider  cetirizine (ZYRTEC) 10 MG tablet Take 10 mg by mouth daily.    Yes Historical Provider, MD  clorazepate (TRANXENE) 7.5 MG tablet Take 1 tablet (7.5 mg total) by mouth 3 (three) times daily. As needed for nerves 02/18/13  Yes Michele Mcalpine, MD  fluticasone [facility] HFA) 44 MCG/ACT inhaler INHALE 2 PUFFS 2 TIMES A DAY 04/01/13  Yes Michele Mcalpine, MD  furosemide (LASIX) 20 MG tablet Take 2 tablets by mouth  every morning 02/06/13  Yes  Michele Mcalpine, MD  ipratropium-albuterol (DUONEB) 0.5-2.5 (3) MG/3ML SOLN Take 3 mLs by nebulization 2 (two) times daily.    Yes Historical Provider, MD  metoprolol succinate (TOPROL-XL) 25 MG 24 hr  tablet Take 25 mg by mouth daily before breakfast.   Yes Historical Provider, MD  mometasone (NASONEX) 50 MCG/ACT nasal spray Place 2 sprays into the nose 2 (two) times daily.    Yes Historical Provider, MD  omeprazole (PRILOSEC) 40 MG capsule Take 1 capsule by mouth  twice a day 02/06/13  Yes Michele Mcalpine, MD  Polyethyl Glycol-Propyl Glycol (SYSTANE) 0.4-0.3 % SOLN Place 2 drops into both eyes 4 (four) times daily.    Yes Historical Provider, MD  pravastatin (PRAVACHOL) 40 MG tablet Take 40 mg by mouth at bedtime.   Yes Historical Provider, MD  sennosides-docusate sodium (SENOKOT-S) 8.6-50 MG tablet Take 2 tablets by mouth at bedtime.    Yes Historical Provider, MD  acetaminophen (TYLENOL) 325 MG tablet Take 2 tablets (650 mg total) by mouth every 6 (six) hours as needed. 04/24/13   Alexzandrew Julien Girt, PA-C  bisacodyl (DULCOLAX) 10 MG suppository Place 1 suppository (10 mg total) rectally daily as needed. 04/24/13   Alexzandrew Julien Girt, PA-C  diphenhydrAMINE (BENADRYL) 12.5 MG/5ML elixir Take 5-10 mLs (12.5-25 mg total) by mouth every 4 (four) hours as needed for itching. 04/24/13   Alexzandrew Julien Girt, PA-C  methocarbamol (ROBAXIN) 500 MG tablet Take 1 tablet (500 mg total) by mouth every 6 (six) hours as needed. 04/24/13   Alexzandrew Julien Girt, PA-C  metoCLOPramide (REGLAN) 5 MG tablet Take 1-2 tablets (5-10 mg total) by mouth every 8 (eight) hours as needed (if ondansetron (ZOFRAN) ineffective.). 04/24/13   Alexzandrew Perkins, PA-C  ondansetron (ZOFRAN) 4 MG tablet Take 1 tablet (4 mg total) by mouth every 6 (six) hours as needed for nausea. 04/24/13   Alexzandrew Perkins, PA-C  oxyCODONE (OXY IR/ROXICODONE) 5 MG immediate release tablet Take 1-2 tablets (5-10 mg total) by mouth every 3 (three) hours as needed.  04/24/13   Alexzandrew Perkins, PA-C  polyethylene glycol (MIRALAX / GLYCOLAX) packet Take 17 g by mouth daily as needed. 04/24/13   Alexzandrew Julien Girt, PA-C  Rivaroxaban (XARELTO) 20 MG TABS Take 1 tablet (20 mg total) by mouth daily. 04/07/13   Josph Macho, MD  traMADol (ULTRAM) 50 MG tablet Take 1-2 tablets (50-100 mg total) by mouth every 6 (six) hours as needed (mild pain). 04/24/13   Alexzandrew Julien Girt, PA-C    Diet: Cardiac diet Activity:WBAT Follow-up:in 2 weeks Disposition - Skilled nursing facility - [facility] Discharged Condition: good   Discharge Orders   Future Appointments Provider Department Dept Phone   05/15/2013 2:30 PM Rachael Fee [facility] [phone]   05/15/2013 3:00 PM Josph Macho, MD [facility] [phone]   09/22/2013 11:00 AM Michele Mcalpine, MD [facility] [phone]   Future Orders Complete By Expires     Call MD / Call 911  As directed     Comments:      If you experience chest pain or shortness of breath, CALL 911 and be transported to the hospital emergency room.  If you develope a fever above 101 F, pus (white drainage) or increased drainage or redness at the wound, or calf pain, call your surgeon's office.    Change dressing  As directed     Comments:      Change dressing daily with sterile 4 x 4 inch gauze dressing and apply TED hose. Do not submerge the incision under water.    Constipation Prevention  As directed     Comments:  Drink plenty of fluids.  Prune juice may be helpful.  You may use a stool softener, such as Colace (over the counter) 100 mg twice a day.  Use MiraLax (over the counter) for constipation as needed.    Diet - low sodium heart healthy  As directed     Discharge instructions  As directed     Comments:      Pick up stool softner and laxative for home. Do not submerge incision under water. May shower. Continue to use ice for pain  and swelling from surgery. Hip precautions.  Total Hip Protocol.  Resume the previous dosing of Xarelto  20 mg daily.    Do not put a pillow under the knee. Place it under the heel.  As directed     Do not sit on low chairs, stoools or toilet seats, as it may be difficult to get up from low surfaces  As directed     Driving restrictions  As directed     Comments:      No driving until released by the physician.    Increase activity slowly as tolerated  As directed     Lifting restrictions  As directed     Comments:      No lifting until released by the physician.    Patient may shower  As directed     Comments:      You may shower without a dressing once there is no drainage.  Do not wash over the wound.  If drainage remains, do not shower until drainage stops.    TED hose  As directed     Comments:      Use stockings (TED hose) for 3 weeks on both leg(s).  You may remove them at night for sleeping.    Weight bearing as tolerated  As directed         Medication List    STOP taking these medications       folic acid 400 MCG tablet  Commonly known as:  FOLVITE     multivitamin with minerals Tabs     PROTEGRA PO     Vitamin D 1000 UNITS capsule      TAKE these medications       acetaminophen 325 MG tablet  Commonly known as:  TYLENOL  Take 2 tablets (650 mg total) by mouth every 6 (six) hours as needed.     bisacodyl 10 MG suppository  Commonly known as:  DULCOLAX  Place 1 suppository (10 mg total) rectally daily as needed.     cetirizine 10 MG tablet  Commonly known as:  ZYRTEC  Take 10 mg by mouth daily.     clorazepate 7.5 MG tablet  Commonly known as:  TRANXENE  Take 1 tablet (7.5 mg total) by mouth 3 (three) times daily. As needed for nerves     diphenhydrAMINE 12.5 MG/5ML elixir  Commonly known as:  BENADRYL  Take 5-10 mLs (12.5-25 mg total) by mouth every 4 (four) hours as needed for itching.     DUONEB 0.5-2.5 (3) MG/3ML Soln  Generic drug:   ipratropium-albuterol  Take 3 mLs by nebulization 2 (two) times daily.     fluticasone 44 MCG/ACT inhaler  Commonly known as:  FLOVENT HFA  INHALE 2 PUFFS 2 TIMES A DAY     furosemide 20 MG tablet  Commonly known as:  LASIX  Take 2 tablets by mouth  every morning     methocarbamol 500 MG tablet  Commonly known as:  ROBAXIN  Take 1 tablet (500 mg total) by mouth every 6 (six) hours as needed.     metoCLOPramide 5 MG tablet  Commonly known as:  REGLAN  Take 1-2 tablets (5-10 mg total) by mouth every 8 (eight) hours as needed (if ondansetron (ZOFRAN) ineffective.).     metoprolol succinate 25 MG 24 hr tablet  Commonly known as:  TOPROL-XL  Take 25 mg by mouth daily before breakfast.     mometasone 50 MCG/ACT nasal spray  Commonly known as:  NASONEX  Place 2 sprays into the nose 2 (two) times daily.     omeprazole 40 MG capsule  Commonly known as:  PRILOSEC  Take 1 capsule by mouth  twice a day     ondansetron 4 MG tablet  Commonly known as:  ZOFRAN  Take 1 tablet (4 mg total) by mouth every 6 (six) hours as needed for nausea.     oxyCODONE 5 MG immediate release tablet  Commonly known as:  Oxy IR/ROXICODONE  Take 1-2 tablets (5-10 mg total) by mouth every 3 (three) hours as needed.     polyethylene glycol packet  Commonly known as:  MIRALAX / GLYCOLAX  Take 17 g by mouth daily as needed.     pravastatin 40 MG tablet  Commonly known as:  PRAVACHOL  Take 40 mg by mouth at bedtime.     Rivaroxaban 20 MG Tabs  Commonly known as:  XARELTO  Take 1 tablet (20 mg total) by mouth daily.     sennosides-docusate sodium 8.6-50 MG tablet  Commonly known as:  SENOKOT-S  Take 2 tablets by mouth at bedtime.     SYSTANE 0.4-0.3 % Soln  Generic drug:  Polyethyl Glycol-Propyl Glycol  Place 2 drops into both eyes 4 (four) times daily.     traMADol 50 MG tablet  Commonly known as:  ULTRAM  Take 1-2 tablets (50-100 mg total) by mouth every 6 (six) hours as needed (mild pain).             Follow-up Information   Follow up with Loanne Drilling, MD. Schedule an appointment as soon as possible for a visit in 2 weeks.   Contact information:   [address], [address] [place] [address] [phone]       Signed: Patrica Duel 04/24/2013, 9:31 AM

## 2013-04-24 NOTE — Progress Notes (Signed)
Subjective: 3 Days Post-Op Procedure(s) (LRB): LEFT TOTAL KNEE ARTHROPLASTY (Left) Patient reports pain as mild.   Patient seen in rounds with Dr. Lequita Halt. Patient is well, and has had no acute complaints or problems Patient is ready to go SNF today.  Objective: Vital signs in last 24 hours: Temp:  [97.8 F (36.6 C)-98.4 F (36.9 C)] 97.8 F (36.6 C) (05/08 0804) Pulse Rate:  [80-95] 95 (05/08 0804) Resp:  [15-18] 16 (05/08 0804) BP: (123-132)/(67-84) 129/84 mmHg (05/08 0804) SpO2:  [94 %-98 %] 95 % (05/08 0804)  Intake/Output from previous day:  Intake/Output Summary (Last 24 hours) at 04/24/13 0923 Last data filed at 04/24/13 0810  Gross per 24 hour  Intake    860 ml  Output    775 ml  Net     85 ml    Intake/Output this shift: Total I/O In: 120 [P.O.:120] Out: -   Labs:  Recent Labs  04/22/13 0451 04/23/13 0503 04/24/13 0523  HGB 10.4* 10.4* 9.5*    Recent Labs  04/23/13 0503 04/24/13 0523  WBC 14.2* 13.1*  RBC 3.56* 3.31*  HCT 31.7* 29.6*  PLT 134* 146*    Recent Labs  04/22/13 0451 04/23/13 0503  NA 139 138  K 3.3* 3.5  CL 101 100  CO2 33* 32  BUN 9 14  CREATININE 0.79 0.75  GLUCOSE 112* 151*  CALCIUM 8.2* 9.1   No results found for this basename: LABPT, INR,  in the last 72 hours  EXAM: General - Patient is Alert, Appropriate and Oriented Extremity - Neurovascular intact Sensation intact distally Dorsiflexion/Plantar flexion intact No cellulitis present Incision - clean, dry, no drainage, healing Motor Function - intact, moving foot and toes well on exam.   Assessment/Plan: 3 Days Post-Op Procedure(s) (LRB): LEFT TOTAL KNEE ARTHROPLASTY (Left) Procedure(s) (LRB): LEFT TOTAL KNEE ARTHROPLASTY (Left) Past Medical History  Diagnosis Date  . Allergic rhinitis   . Sarcoidosis   . Pulmonary hypertension   . Asthmatic bronchitis   . Pulmonary embolism   . Hypertension   . CAD (coronary artery disease)   . Cardiac pacemaker  in situ   . Venous insufficiency   . Hyperlipidemia   . Obesity   . PUD (peptic ulcer disease)   . Hemorrhoids   . Renal insufficiency   . UTI (lower urinary tract infection)   . DJD (degenerative joint disease)   . Cervicalgia   . Lumbar back pain   . Anxiety   . Myocardial infarction 2012  . CHF (congestive heart failure) 2012  . COPD (chronic obstructive pulmonary disease)   . Shortness of breath     OCCASIONAL  . GERD (gastroesophageal reflux disease)   . Cancer     VULVAR CANCER  . Pacemaker   . Stroke 2006    balance problems remain  . OSA (obstructive sleep apnea)     USES O2 AT HOME WITH NASAL MASK   Principal Problem:   OA (osteoarthritis) of knee Active Problems:   Postoperative anemia due to acute blood loss   Postop Hypokalemia  Estimated body mass index is 29.59 kg/(m^2) as calculated from the following:   Height as of this encounter: 5' 4.5" (1.638 m).   Weight as of this encounter: 79.379 kg (175 lb). Up with therapy Discharge to SNF Diet - Cardiac diet Follow up - in 2 weeks Activity - WBAT Disposition - Skilled nursing facility Condition Upon Discharge - Good D/C Meds - See DC Summary DVT Prophylaxis -  Xarelto  Buck Mcaffee 04/24/2013, 9:23 AM

## 2013-04-24 NOTE — Care Management Note (Signed)
    Page 1 of 1   04/24/2013     3:12:22 PM   CARE MANAGEMENT NOTE 04/24/2013  Patient:  [name] [name], [name] [name]   Account Number:  [id number]  Date Initiated:  04/24/2013  Documentation initiated by:  Colleen Can  Subjective/Objective Assessment:   dx total knee replacemnt     Action/Plan:   SNF rehab   Anticipated DC Date:  04/24/2013   Anticipated DC Plan:  SKILLED NURSING FACILITY  In-house referral  Clinical Social Worker      DC Planning Services  CM consult      Choice offered to / List presented to:             Status of service:  Completed, signed off Medicare Important Message given?  NA - LOS <3 / Initial given by admissions (If response is "NO", the following Medicare IM given date fields will be blank) Date Medicare IM given:   Date Additional Medicare IM given:    Discharge Disposition:  SKILLED NURSING FACILITY  Per UR Regulation:    If discussed at Long Length of Stay Meetings, dates discussed:    Comments:

## 2013-04-24 NOTE — Progress Notes (Signed)
Clinical Social Work Department CLINICAL SOCIAL WORK PLACEMENT NOTE 04/24/2013  Patient:  [name] [name], [name] [name]  Account Number:  [id number] Admit date:  04/21/2013  Clinical Social Worker:  Cori Razor, LCSW  Date/time:  04/22/2013 12:16 PM  Clinical Social Work is seeking post-discharge placement for this patient at the following level of care:   SKILLED NURSING   (*CSW will update this form in Epic as items are completed)     Patient/family provided with [facility] Department of Clinical Social Work's list of facilities offering this level of care within the geographic area requested by the patient (or if unable, by the patient's family).  04/22/2013  Patient/family informed of their freedom to choose among providers that offer the needed level of care, that participate in Medicare, Medicaid or managed care program needed by the patient, have an available bed and are willing to accept the patient.    Patient/family informed of [facility]' ownership interest in [facility], as well as of the fact that they are under no obligation to receive care at this facility.  PASARR submitted to EDS on  PASARR number received from EDS on 07/27/2010  FL2 transmitted to all facilities in geographic area requested by pt/family on  04/22/2013 FL2 transmitted to all facilities within larger geographic area on   Patient informed that his/her managed care company has contracts with or will negotiate with  certain facilities, including the following:     Patient/family informed of bed offers received:  04/22/2013 Patient chooses bed at [facility], [place] Physician recommends and patient chooses bed at    [facility] [place] on  04/24/2013 Patient to be transferred to facility by FAMILY  The following physician request were entered in Epic:   Additional Comments:  Cori Razor LCSW [phone]

## 2013-04-24 NOTE — Progress Notes (Signed)
Physical Therapy Treatment Patient Details Name: [name] [name] MRN: [id number] DOB: October 19, 1936 Today's Date: 04/24/2013 Time: 4098-1191 PT Time Calculation (min): 29 min  PT Assessment / Plan / Recommendation Comments on Treatment Session  POD # 3 L TKR am session.  Pt sitting on EOB on arrival.  Re applied KI and instructed pt on use.Amb in hallway then performed TE's while in recliner.  Pt plans to D/C to [facility] for ST Rehab.    Follow Up Recommendations  SNF     Does the patient have the potential to tolerate intense rehabilitation     Barriers to Discharge        Equipment Recommendations  None recommended by PT    Recommendations for Other Services    Frequency 7X/week   Plan Frequency remains appropriate;Discharge plan remains appropriate    Precautions / Restrictions Precautions Precautions: Knee Required Braces or Orthoses: Knee Immobilizer - Left Knee Immobilizer - Left: Discontinue once straight leg raise with < 10 degree lag Restrictions Weight Bearing Restrictions: No Other Position/Activity Restrictions: WBAT   Pertinent Vitals/Pain C/o 3/10 during TE's ICE applied    Mobility  Bed Mobility Bed Mobility: Not assessed Details for Bed Mobility Assistance: Pt sit on EOB on arrival Transfers Transfers: Sit to Stand;Stand to Sit Sit to Stand: 4: Min assist;From elevated surface;With upper extremity assist;From bed Stand to Sit: 4: Min assist;With upper extremity assist;With armrests;To chair/3-in-1 Details for Transfer Assistance: Assist to rise, steady and ensure controlled descent with cues for hand placement and LE managemetn.  Ambulation/Gait Ambulation/Gait Assistance: 4: Min guard Ambulation Distance (Feet): 145 Feet Assistive device: Rolling walker Ambulation/Gait Assistance Details: <25% VC's on proper walker to self distance and safety with turns.  Increased time. Gait Pattern: Step-to pattern;Trunk flexed;Antalgic Gait velocity: decreased     Exercises   Total Knee Replacement TE's 10 reps B LE ankle pumps 10 reps knee presses 10 reps heel slides  10 reps SAQ's 10 reps SLR's 10 reps ABD Followed by ICE    PT Goals                                                      progressing    Visit Information  Last PT Received On: 04/24/13 Assistance Needed: +1    Subjective Data      Cognition    good   Balance   fair  End of Session PT - End of Session Equipment Utilized During Treatment: Left knee immobilizer Activity Tolerance: Patient tolerated treatment well Patient left: in chair;with call bell/phone within reach   Felecia Shelling  PTA [facility]  Acute  Rehab Pager      [phone]

## 2013-05-09 ENCOUNTER — Telehealth: Payer: Self-pay | Admitting: Hematology & Oncology

## 2013-05-09 NOTE — Telephone Encounter (Signed)
Left pt message moved time of 5-29 appointment

## 2013-05-15 ENCOUNTER — Ambulatory Visit: Payer: Medicare Other | Admitting: Hematology & Oncology

## 2013-05-15 ENCOUNTER — Other Ambulatory Visit (HOSPITAL_BASED_OUTPATIENT_CLINIC_OR_DEPARTMENT_OTHER): Payer: Medicare Other | Admitting: Lab

## 2013-05-15 ENCOUNTER — Ambulatory Visit (HOSPITAL_BASED_OUTPATIENT_CLINIC_OR_DEPARTMENT_OTHER): Payer: Medicare Other | Admitting: Medical

## 2013-05-15 ENCOUNTER — Other Ambulatory Visit: Payer: Medicare Other | Admitting: Lab

## 2013-05-15 VITALS — BP 156/77 | HR 86 | Temp 98.1°F | Resp 16 | Ht 64.0 in | Wt 194.0 lb

## 2013-05-15 DIAGNOSIS — I82409 Acute embolism and thrombosis of unspecified deep veins of unspecified lower extremity: Secondary | ICD-10-CM

## 2013-05-15 DIAGNOSIS — Z7901 Long term (current) use of anticoagulants: Secondary | ICD-10-CM

## 2013-05-15 DIAGNOSIS — I2699 Other pulmonary embolism without acute cor pulmonale: Secondary | ICD-10-CM

## 2013-05-15 DIAGNOSIS — D071 Carcinoma in situ of vulva: Secondary | ICD-10-CM

## 2013-05-15 DIAGNOSIS — D069 Carcinoma in situ of cervix, unspecified: Secondary | ICD-10-CM

## 2013-05-15 LAB — CBC WITH DIFFERENTIAL (CANCER CENTER ONLY)
BASO#: 0 10*3/uL (ref 0.0–0.2)
EOS%: 1.5 % (ref 0.0–7.0)
Eosinophils Absolute: 0.1 10*3/uL (ref 0.0–0.5)
HCT: 36.1 % (ref 34.8–46.6)
HGB: 11.3 g/dL — ABNORMAL LOW (ref 11.6–15.9)
LYMPH#: 1.2 10*3/uL (ref 0.9–3.3)
MCHC: 31.3 g/dL — ABNORMAL LOW (ref 32.0–36.0)
NEUT#: 3.8 10*3/uL (ref 1.5–6.5)
NEUT%: 61.9 % (ref 39.6–80.0)
RBC: 3.88 10*6/uL (ref 3.70–5.32)

## 2013-05-15 NOTE — Progress Notes (Signed)
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 #2 Vulvar in situ neoplasm-grade 3.  Current therapy: #1 Xarelto 20 mg by mouth daily. #2 patient is status post wide local excision of the vulvar lesion.  Interim history: [name] [name] presents today for an office followup visit.  Overall, she, reports, that she's doing quite well.  She had a  total knee replacement on her left knee back on 04/21/13. She got through the surgery well.  She did not have any problems with blood clots.  She is back at home with physical therapy.   She continues to followup with her gynecologist for history of volvulus or in situ neoplasm, grade 3.   She remains on a Xarelto without any problems.  She does not report any nausea or vomiting.  She has an excellent appetite.  She denies any obvious, or abnormal bleeding or bruising.  She does not report any lower leg swelling.  She does not report any new, shortness of breath, or chest pain, or cough.  She denies any fevers, chills, or night sweats.  She denies any headaches, blurry vision, or rashes.  Review of Systems: Constitutional:Negative for malaise/fatigue, fever, chills, weight loss, diaphoresis, activity change, appetite change, and unexpected weight change.  HEENT: Negative for double vision, blurred vision, visual loss, ear pain, tinnitus, congestion, rhinorrhea, epistaxis sore throat or sinus disease, oral pain/lesion, tongue soreness Respiratory: Negative for cough, chest tightness, shortness of breath, wheezing and stridor.  Cardiovascular: Negative for chest pain, palpitations, leg swelling, orthopnea, PND, DOE or claudication Gastrointestinal: Negative for nausea, vomiting, abdominal pain, diarrhea, constipation, blood in stool, melena, hematochezia, abdominal distention, anal bleeding, rectal pain, anorexia and hematemesis.  Genitourinary: Negative for dysuria, frequency, hematuria,  Musculoskeletal: Negative for myalgias, back pain, joint  swelling, arthralgias and gait problem.  Skin: Negative for rash, color change, pallor and wound.  Neurological:. Negative for dizziness/light-headedness, tremors, seizures, syncope, facial asymmetry, speech difficulty, weakness, numbness, headaches and paresthesias.  Hematological: Negative for adenopathy. Does not bruise/bleed easily.  Psychiatric/Behavioral:  Negative for depression, no loss of interest in normal activity or change in sleep pattern.   Physical Exam: This is a very pleasant, well-developed, well-nourished, 77 year old, white female, in no obvious distress Vitals: Temperature 98.1 degrees pulse 86 respirations 16 blood pressure 156/77 weight 194 pounds HEENT reveals a normocephalic, atraumatic skull, no scleral icterus, no oral lesions  Neck is supple without any cervical or supraclavicular adenopathy.  Lungs are clear to auscultation bilaterally. There are no wheezes, rales or rhonci Cardiac is regular rate and rhythm with a normal S1 and S2. There are no murmurs, rubs, or bruits.  Abdomen is soft with good bowel sounds, there is no palpable mass. There is no palpable hepatosplenomegaly. There is no palpable fluid wave.  Musculoskeletal no tenderness of the spine, ribs, or hips.  Extremities there are no clubbing, cyanosis, her left knee is quite edematous from recent surgery.  Skin no petechia, purpura or ecchymosis Neurologic is nonfocal.  Laboratory Data: White count 6.2 hemoglobin 11.3 hematocrit 36.1 platelets 269,000  Current Outpatient Prescriptions on File Prior to Visit  Medication Sig Dispense Refill  . beclomethasone (QVAR) 40 MCG/ACT inhaler Inhale 2 puffs into the lungs 2 (two) times daily.  3 Inhaler  3  . cetirizine (ZYRTEC) 10 MG tablet Take 10 mg by mouth daily.       . Cholecalciferol (VITAMIN D) 1000 UNITS capsule Take 1,000 Units by mouth daily.       . clorazepate (TRANXENE)  7.5 MG tablet Take 1 tablet (7.5 mg total) by mouth 3 (three) times daily. As  needed for nerves  270 tablet  3  . Diphenhyd-Hydrocort-Nystatin (FIRST-DUKES MOUTHWASH) SUSP Take 5 mLs by mouth 4 (four) times daily as needed. gargle and swallow up to 4 times daily as needed for sore throat      . ESTRACE VAGINAL 0.1 MG/GM vaginal cream Place 2 g vaginally daily.       [name] Kitchen FLOVENT HFA 44 MCG/ACT inhaler INHALE 2 PUFFS 2 TIMES A DAY  10.6 g  5  . fluticasone (FLONASE) 50 MCG/ACT nasal spray Place 2 sprays into the nose daily.  48 g  3  . folic acid (FOLVITE) 1 MG tablet Take 1 mg by mouth daily.       . furosemide (LASIX) 20 MG tablet Take 2 tablets by mouth  every morning  180 tablet  2  . ipratropium-albuterol (DUONEB) 0.5-2.5 (3) MG/3ML SOLN Take 3 mLs by nebulization 2 (two) times daily.       . metoprolol succinate (TOPROL-XL) 25 MG 24 hr tablet Take 1 tablet by mouth  daily  90 tablet  3  . Multiple Vitamins-Minerals (CENTRUM SILVER PO) Take 1 tablet by mouth daily.        [name] Kitchen omeprazole (PRILOSEC) 40 MG capsule Take 1 capsule by mouth  twice a day  180 capsule  2  . Polyethyl Glycol-Propyl Glycol (SYSTANE) 0.4-0.3 % SOLN Apply 2 drops to eye daily.       . pravastatin (PRAVACHOL) 40 MG tablet Take 1 tablet (40 mg total) by mouth daily.  90 tablet  3  . Rivaroxaban (XARELTO) 20 MG TABS Take 1 tablet (20 mg total) by mouth daily.  90 tablet  1  . sennosides-docusate sodium (SENOKOT-S) 8.6-50 MG tablet Take 2 tablets by mouth at bedtime.        No current facility-administered medications on file prior to visit.   Assessment/Plan: This is a pleasant, 77 year old, white female, with the following issues:  #1 recurrent deep vein thrombosis of the right leg.  She remains on long-term Xarelto.  She is tolerating this well.  I do not see any problems with respect to, DVT, recurrence.  #2.  History of vulvar in situ neoplasm-grade 3.  Again, she is status post wide local excision of the vulvar lesion 4 months ago to date.  There's been no evidence of recurrent dysplasia.  She will  followup with her gynecologist for regular visits.  #3.  Followup.  We will follow back up with [name] [name] in 3 months, but before then should there be any questions or concerns.

## 2013-05-17 ENCOUNTER — Other Ambulatory Visit (HOSPITAL_COMMUNITY): Payer: Self-pay | Admitting: Oncology

## 2013-05-19 ENCOUNTER — Telehealth: Payer: Self-pay | Admitting: Pulmonary Disease

## 2013-05-19 MED ORDER — IPRATROPIUM-ALBUTEROL 0.5-2.5 (3) MG/3ML IN SOLN: 3.0000 mL | Freq: Two times a day (BID) | RESPIRATORY_TRACT | Status: DC

## 2013-05-19 NOTE — Telephone Encounter (Signed)
i have reviewed pts last OV note with SN.  Pt has been doing the duoneb BID  And pt stated that [facility] keeps telling her that they have this listed as QID.  Called and spoke [facility] and they stated that the medical necessity form was signed by SN for QID.  i will print out new rx for the directions and fax this to the [facility] pharmacy at [phone].  rx has been printed out and pt has plenty of meds at this time so no need to fill medication at this time.

## 2013-05-21 ENCOUNTER — Ambulatory Visit: Payer: Medicare Other | Admitting: Pulmonary Disease

## 2013-06-09 ENCOUNTER — Ambulatory Visit: Payer: Medicare Other | Admitting: Pulmonary Disease

## 2013-06-11 ENCOUNTER — Telehealth: Payer: Self-pay | Admitting: Pulmonary Disease

## 2013-06-11 DIAGNOSIS — R300 Dysuria: Secondary | ICD-10-CM

## 2013-06-11 NOTE — Telephone Encounter (Signed)
lmomtcb for pt 

## 2013-06-11 NOTE — Telephone Encounter (Signed)
Pt returned triage's call.  [name] [name] ° °

## 2013-06-11 NOTE — Telephone Encounter (Signed)
LMTCB

## 2013-06-12 ENCOUNTER — Other Ambulatory Visit (INDEPENDENT_AMBULATORY_CARE_PROVIDER_SITE_OTHER): Payer: Medicare Other

## 2013-06-12 DIAGNOSIS — R300 Dysuria: Secondary | ICD-10-CM

## 2013-06-12 LAB — URINALYSIS, ROUTINE W REFLEX MICROSCOPIC: Urine Glucose: NEGATIVE

## 2013-06-12 MED ORDER — CIPROFLOXACIN HCL 250 MG PO TABS: 250.0000 mg | ORAL_TABLET | Freq: Two times a day (BID) | ORAL | Status: DC

## 2013-06-12 NOTE — Telephone Encounter (Signed)
Spoke with pt and notified of recs per SN She verbalized understanding  Will go ahead and start the cipro, this was called to pharm  Lab orders placed

## 2013-06-12 NOTE — Telephone Encounter (Signed)
Per SN--  Ok for ua and urine culture Does she want to start on cipro 250 mg  1 po bid  #14?    thanks

## 2013-06-12 NOTE — Telephone Encounter (Signed)
Spoke with pt  She is c/o urinary frequency, dysuria and low back pain x 3 days She has increased water intake and drinking cranberry juice with no relief Please advise  Last ov 03/19/13 Next ov 09/22/13 Allergies  Allergen Reactions  . Sulfa Antibiotics Other (See Comments)    Doesn't like to drink lots of water.  . Sulfonamide Derivatives     REACTION: nausea  . Sumatriptan Other (See Comments)    Reaction unknown

## 2013-06-12 NOTE — Telephone Encounter (Signed)
Pt returned call. [name] [name] °

## 2013-06-14 LAB — URINE CULTURE

## 2013-08-14 ENCOUNTER — Ambulatory Visit: Payer: Medicare Other | Admitting: Hematology & Oncology

## 2013-08-14 ENCOUNTER — Other Ambulatory Visit: Payer: Medicare Other | Admitting: Lab

## 2013-09-03 ENCOUNTER — Other Ambulatory Visit: Payer: Self-pay | Admitting: Hematology & Oncology

## 2013-09-03 ENCOUNTER — Other Ambulatory Visit: Payer: Self-pay | Admitting: Pulmonary Disease

## 2013-09-04 ENCOUNTER — Ambulatory Visit (INDEPENDENT_AMBULATORY_CARE_PROVIDER_SITE_OTHER): Payer: Medicare Other | Admitting: Internal Medicine

## 2013-09-04 ENCOUNTER — Encounter: Payer: Self-pay | Admitting: Internal Medicine

## 2013-09-04 VITALS — BP 144/92 | HR 70 | Ht 64.0 in | Wt 179.0 lb

## 2013-09-04 DIAGNOSIS — Z950 Presence of cardiac pacemaker: Secondary | ICD-10-CM

## 2013-09-04 DIAGNOSIS — I6350 Cerebral infarction due to unspecified occlusion or stenosis of unspecified cerebral artery: Secondary | ICD-10-CM

## 2013-09-04 DIAGNOSIS — I10 Essential (primary) hypertension: Secondary | ICD-10-CM

## 2013-09-04 DIAGNOSIS — I498 Other specified cardiac arrhythmias: Secondary | ICD-10-CM

## 2013-09-04 LAB — PACEMAKER DEVICE OBSERVATION
AL AMPLITUDE: 5.6 mv
BATTERY VOLTAGE: 2.76 V
RV LEAD IMPEDENCE PM: 510 Ohm
VENTRICULAR PACING PM: 99

## 2013-09-04 NOTE — Assessment & Plan Note (Signed)
Her blood pressure is reasonably well controlled. I've encouraged the patient to reduce her salt intake, and lose weight. She'll continue her current medical therapy.

## 2013-09-04 NOTE — Assessment & Plan Note (Signed)
She's had no recurrences on Xarelto. She will continue her current medications.

## 2013-09-04 NOTE — Progress Notes (Signed)
HPI [name] [name] returns today for followup. She is a very pleasant 77 year old woman with a history of intermittent complete heart block, status post permanent pacemaker insertion, hypertension, dyslipidemia, and stroke. She has paroxysmal atrial fibrillation. The patient has done well in the interim, and is tolerating her anticoagulation very nicely. Previously, she took Lovenox injections, but is now on Xarelto. She denies chest pain, shortness of breath, or syncope. Allergies  Allergen Reactions  . Sulfa Antibiotics Other (See Comments)    Doesn't like to drink lots of water.  . Sulfonamide Derivatives     REACTION: nausea  . Sumatriptan Other (See Comments)    Reaction unknown     Current Outpatient Prescriptions  Medication Sig Dispense Refill  . acetaminophen (TYLENOL) 325 MG tablet Take 2 tablets (650 mg total) by mouth every 6 (six) hours as needed.  60 tablet  0  . cetirizine (ZYRTEC) 10 MG tablet Take 10 mg by mouth daily.       . clorazepate (TRANXENE) 7.5 MG tablet Take 1 tablet (7.5 mg total) by mouth 3 (three) times daily. As needed for nerves  270 tablet  3  . fluticasone (FLOVENT HFA) 44 MCG/ACT inhaler INHALE 2 PUFFS 2 TIMES A DAY  3 Inhaler  2  . furosemide (LASIX) 20 MG tablet Take 2 tablets by mouth  every morning  180 tablet  2  . ipratropium-albuterol (DUONEB) 0.5-2.5 (3) MG/3ML SOLN Take 3 mLs by nebulization 2 (two) times daily.  180 mL  5  . metoprolol succinate (TOPROL-XL) 25 MG 24 hr tablet Take 25 mg by mouth daily before breakfast.      . mometasone (NASONEX) 50 MCG/ACT nasal spray Place 2 sprays into the nose 2 (two) times daily.       [name] Kitchen omeprazole (PRILOSEC) 40 MG capsule Take 1 capsule by mouth  twice a day  180 capsule  6  . Polyethyl Glycol-Propyl Glycol (SYSTANE) 0.4-0.3 % SOLN Place 2 drops into both eyes 4 (four) times daily.       . polyethylene glycol (MIRALAX / GLYCOLAX) packet Take 17 g by mouth daily as needed.  14 each  0  . pravastatin (PRAVACHOL)  40 MG tablet Take 40 mg by mouth at bedtime.      . sennosides-docusate sodium (SENOKOT-S) 8.6-50 MG tablet Take 2 tablets by mouth at bedtime.       [name] [name] 20 MG TABS tablet Take 1 tablet by mouth  daily  30 tablet  4   No current facility-administered medications for this visit.     Past Medical History  Diagnosis Date  . Allergic rhinitis   . Sarcoidosis   . Pulmonary hypertension   . Asthmatic bronchitis   . Pulmonary embolism   . Hypertension   . CAD (coronary artery disease)   . Cardiac pacemaker in situ   . Venous insufficiency   . Hyperlipidemia   . Obesity   . PUD (peptic ulcer disease)   . Hemorrhoids   . Renal insufficiency   . UTI (lower urinary tract infection)   . DJD (degenerative joint disease)   . Cervicalgia   . Lumbar back pain   . Anxiety   . Myocardial infarction 2012  . CHF (congestive heart failure) 2012  . COPD (chronic obstructive pulmonary disease)   . Shortness of breath     OCCASIONAL  . GERD (gastroesophageal reflux disease)   . Cancer     VULVAR CANCER  . Pacemaker   . Stroke 2006  balance problems remain  . OSA (obstructive sleep apnea)     USES O2 AT HOME WITH NASAL MASK    ROS:   All systems reviewed and negative except as noted in the HPI.   Past Surgical History  Procedure Laterality Date  . Appendectomy    . Cholecystectomy  01/1996    Dr. Woodfin Ganja  . Abdominal hysterectomy    . Lumbar disc surgery  1990    X2  . Left knee arthroscopy    . Ivc filter placed  07/2010  . Pacemaker insertion  2008  . Vulvar lesion removal  07/16/2012    Procedure: VULVAR LESION;  Surgeon: [name] Brock A. Duard Brady, MD;  Location: [facility];  Service: Gynecology;  Laterality: Right;  Wide Local Excision of the Vulvar  . Total knee arthroplasty Left 04/21/2013    Procedure: LEFT TOTAL KNEE ARTHROPLASTY;  Surgeon: [name] Drilling, MD;  Location: [facility];  Service: Orthopedics;  Laterality: Left;     Family History  Problem Relation Age of Onset   . Heart attack Father   . Cancer Sister   . Heart attack Brother      History   Social History  . Marital Status: Widowed    Spouse Name: N/A    Number of Children: 4  . Years of Education: N/A   Occupational History  . retired    Social History Main Topics  . Smoking status: Never Smoker   . Smokeless tobacco: Not on file  . Alcohol Use: No  . Drug Use: No  . Sexual Activity: Not on file   Other Topics Concern  . Not on file   Social History Narrative  . No narrative on file     BP 144/92  Pulse 70  Ht 5\' 4"  (1.626 m)  Wt 179 lb (81.194 kg)  BMI 30.71 kg/m2  Physical Exam:  Well appearing 77 year old woman,NAD HEENT: Unremarkable Neck:  No JVD, no thyromegally Back:  No CVA tenderness Lungs:  Clear with no wheezes, rales, or rhonchi. HEART:  Regular rate rhythm, no murmurs, no rubs, no clicks Abd:  soft, positive bowel sounds, no organomegally, no rebound, no guarding Ext:  2 plus pulses, no edema, no cyanosis, no clubbing Skin:  No rashes no nodules Neuro:  CN II through XII intact, motor grossly intact  DEVICE  Normal device function.  See PaceArt for details.   Assess/Plan:

## 2013-09-04 NOTE — Assessment & Plan Note (Signed)
Her Medtronic dual-chamber pacemaker is working normally. She has approximately 3/2 years of battery longevity. We'll plan to recheck in several months.

## 2013-09-04 NOTE — Patient Instructions (Addendum)
Your physician wants you to follow-up in: 12 months with Dr. Ladona Ridgel. You will receive a reminder letter in the mail two months in advance. If you don't receive a letter, please call our office to schedule the follow-up appointment.  Remote monitoring is used to monitor your Pacemaker of ICD from home. This monitoring reduces the number of office visits required to check your device to one time per year. It allows [place] to keep an eye on the functioning of your device to ensure it is working properly. You are scheduled for a device check from home on [employer] 12/08/2013. You may send your transmission at any time that day. If you have a wireless device, the transmission will be sent automatically. After your physician reviews your transmission, you will receive a postcard with your next transmission date.    Your physician recommends that you continue on your current medications as directed. Please refer to the Current Medication list given to you today.

## 2013-09-22 ENCOUNTER — Ambulatory Visit (INDEPENDENT_AMBULATORY_CARE_PROVIDER_SITE_OTHER): Payer: Medicare Other | Admitting: Pulmonary Disease

## 2013-09-22 ENCOUNTER — Encounter: Payer: Self-pay | Admitting: Pulmonary Disease

## 2013-09-22 ENCOUNTER — Encounter: Payer: Self-pay | Admitting: Internal Medicine

## 2013-09-22 VITALS — BP 140/80 | HR 82 | Temp 98.2°F | Ht 64.5 in | Wt 183.4 lb

## 2013-09-22 DIAGNOSIS — D869 Sarcoidosis, unspecified: Secondary | ICD-10-CM

## 2013-09-22 DIAGNOSIS — E785 Hyperlipidemia, unspecified: Secondary | ICD-10-CM

## 2013-09-22 DIAGNOSIS — I2789 Other specified pulmonary heart diseases: Secondary | ICD-10-CM

## 2013-09-22 DIAGNOSIS — I82409 Acute embolism and thrombosis of unspecified deep veins of unspecified lower extremity: Secondary | ICD-10-CM

## 2013-09-22 DIAGNOSIS — K279 Peptic ulcer, site unspecified, unspecified as acute or chronic, without hemorrhage or perforation: Secondary | ICD-10-CM

## 2013-09-22 DIAGNOSIS — M1990 Unspecified osteoarthritis, unspecified site: Secondary | ICD-10-CM

## 2013-09-22 DIAGNOSIS — Z950 Presence of cardiac pacemaker: Secondary | ICD-10-CM

## 2013-09-22 DIAGNOSIS — I872 Venous insufficiency (chronic) (peripheral): Secondary | ICD-10-CM

## 2013-09-22 DIAGNOSIS — I10 Essential (primary) hypertension: Secondary | ICD-10-CM

## 2013-09-22 DIAGNOSIS — F411 Generalized anxiety disorder: Secondary | ICD-10-CM

## 2013-09-22 DIAGNOSIS — N259 Disorder resulting from impaired renal tubular function, unspecified: Secondary | ICD-10-CM

## 2013-09-22 DIAGNOSIS — E669 Obesity, unspecified: Secondary | ICD-10-CM

## 2013-09-22 DIAGNOSIS — I2510 Atherosclerotic heart disease of native coronary artery without angina pectoris: Secondary | ICD-10-CM

## 2013-09-22 DIAGNOSIS — J209 Acute bronchitis, unspecified: Secondary | ICD-10-CM

## 2013-09-22 MED ORDER — PANTOPRAZOLE SODIUM 40 MG PO TBEC: 40.0000 mg | DELAYED_RELEASE_TABLET | Freq: Two times a day (BID) | ORAL | Status: DC

## 2013-09-22 NOTE — Patient Instructions (Addendum)
Today we updated your med list in our EPIC system...    Continue your current medications the same...  We decided to change your Omeprazole to PROTONIX (Pantoprazole) 40mg  - one tab twice daily taken 30 min before the 1st 7 last meals of the day...    You should also elevate the head of your bed on 6" blocks tohelp prevent reflux at night...    Finally - do not eat or drink after dinner in the eve...  Please return to our lab one morning this week for your FASTING blood work...    We will contact you w/ the results when available...   We gave you the 2014 FLU vaccine today...  Call for any questions...  Let's plan a follow up visit in 68mo, sooner if needed for problems.[name] KitchenMarland Kitchen

## 2013-09-22 NOTE — Progress Notes (Signed)
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 (PRAVACHOL) 40 MG TABLET pravastatin (PRAVACHOL) 40 MG tablet      Take 1 tablet by mouth  daily.    Take 1 tablet (40 mg total) by mouth daily.  Discontinued Medications   OMEPRAZOLE (PRILOSEC) 40 MG CAPSULE    Take 1 capsule by mouth  twice a day

## 2013-09-23 ENCOUNTER — Telehealth: Payer: Self-pay | Admitting: Pulmonary Disease

## 2013-09-23 ENCOUNTER — Other Ambulatory Visit (INDEPENDENT_AMBULATORY_CARE_PROVIDER_SITE_OTHER): Payer: Medicare Other

## 2013-09-23 DIAGNOSIS — F411 Generalized anxiety disorder: Secondary | ICD-10-CM

## 2013-09-23 DIAGNOSIS — I82409 Acute embolism and thrombosis of unspecified deep veins of unspecified lower extremity: Secondary | ICD-10-CM

## 2013-09-23 DIAGNOSIS — I10 Essential (primary) hypertension: Secondary | ICD-10-CM

## 2013-09-23 DIAGNOSIS — D869 Sarcoidosis, unspecified: Secondary | ICD-10-CM

## 2013-09-23 DIAGNOSIS — E785 Hyperlipidemia, unspecified: Secondary | ICD-10-CM

## 2013-09-23 LAB — HEPATIC FUNCTION PANEL
ALT: 12 U/L (ref 0–35)
AST: 21 U/L (ref 0–37)
Alkaline Phosphatase: 46 U/L (ref 39–117)
Bilirubin, Direct: 0.1 mg/dL (ref 0.0–0.3)
Total Bilirubin: 0.6 mg/dL (ref 0.3–1.2)

## 2013-09-23 LAB — LIPID PANEL
Cholesterol: 149 mg/dL (ref 0–200)
HDL: 42 mg/dL (ref >39.00)
LDL Cholesterol: 84 mg/dL (ref 0–99)
Total CHOL/HDL Ratio: 4

## 2013-09-23 LAB — TSH: TSH: 2.71 u[IU]/mL (ref 0.35–5.50)

## 2013-09-23 LAB — BASIC METABOLIC PANEL
Chloride: 106 mEq/L (ref 96–112)
Creatinine, Ser: 0.8 mg/dL (ref 0.4–1.2)
Potassium: 3.8 mEq/L (ref 3.5–5.1)
Sodium: 144 mEq/L (ref 135–145)

## 2013-09-23 LAB — CBC WITH DIFFERENTIAL/PLATELET
Eosinophils Absolute: 0.2 10*3/uL (ref 0.0–0.7)
Lymphs Abs: 1.6 10*3/uL (ref 0.7–4.0)
MCHC: 33.5 g/dL (ref 30.0–36.0)
MCV: 88.1 fl (ref 78.0–100.0)
Monocytes Absolute: 0.8 10*3/uL (ref 0.1–1.0)
Neutro Abs: 4 10*3/uL (ref 1.4–7.7)
Neutrophils Relative %: 60.1 % (ref 43.0–77.0)
Platelets: 184 10*3/uL (ref 150.0–400.0)

## 2013-09-23 LAB — ANGIOTENSIN CONVERTING ENZYME: Angiotensin-Converting Enzyme: 44 U/L (ref 8–52)

## 2013-09-23 NOTE — Telephone Encounter (Signed)
I spoke with pt. She wanted to know if she could buy a temper pedic bed since they were on sale. I advised pt that was up to her if she felt like she needs to do. Nothing further needed

## 2013-09-25 ENCOUNTER — Telehealth: Payer: Self-pay | Admitting: Internal Medicine

## 2013-09-25 NOTE — Telephone Encounter (Signed)
New Problem:  Pt states she received the remote device for her pacemaker... However, pt states she needs the adapter for her cell phone. Please advise

## 2013-09-25 NOTE — Telephone Encounter (Signed)
Spoke w/pt and again number was given for [employer].

## 2013-10-20 ENCOUNTER — Other Ambulatory Visit: Payer: Self-pay | Admitting: Pulmonary Disease

## 2013-10-23 ENCOUNTER — Other Ambulatory Visit: Payer: Self-pay | Admitting: Pulmonary Disease

## 2013-10-23 MED ORDER — CLORAZEPATE DIPOTASSIUM 7.5 MG PO TABS: 7.5000 mg | ORAL_TABLET | Freq: Three times a day (TID) | ORAL | Status: DC

## 2013-10-23 NOTE — Telephone Encounter (Signed)
rx faxed to the pharmacy

## 2013-10-31 ENCOUNTER — Other Ambulatory Visit: Payer: Self-pay | Admitting: *Deleted

## 2013-10-31 MED ORDER — MOMETASONE FUROATE 50 MCG/ACT NA SUSP: 2.0000 | Freq: Two times a day (BID) | NASAL | Status: DC

## 2013-12-29 ENCOUNTER — Encounter: Payer: Medicare Other | Admitting: *Deleted

## 2013-12-29 DIAGNOSIS — I498 Other specified cardiac arrhythmias: Secondary | ICD-10-CM

## 2013-12-29 LAB — MDC_IDC_ENUM_SESS_TYPE_REMOTE
Battery Remaining Longevity: 36 mo
Battery Voltage: 2.76 V
Brady Statistic AS VP Percent: 88 %
Lead Channel Impedance Value: 542 Ohm
Lead Channel Pacing Threshold Amplitude: 0.5 V
Lead Channel Pacing Threshold Amplitude: 0.875 V
Lead Channel Pacing Threshold Pulse Width: 0.4 ms
Lead Channel Setting Pacing Amplitude: 2 V
Lead Channel Setting Pacing Amplitude: 2.5 V
Lead Channel Setting Pacing Pulse Width: 0.4 ms
Lead Channel Setting Sensing Sensitivity: 4 mV
MDC IDC MSMT BATTERY IMPEDANCE: 1603 Ohm
MDC IDC MSMT LEADCHNL RA IMPEDANCE VALUE: 395 Ohm
MDC IDC MSMT LEADCHNL RA PACING THRESHOLD PULSEWIDTH: 0.4 ms
MDC IDC MSMT LEADCHNL RA SENSING INTR AMPL: 2.8 mV — AB
MDC IDC SESS DTM: 20150112141840
MDC IDC STAT BRADY AP VP PERCENT: 12 %
MDC IDC STAT BRADY AP VS PERCENT: 0 %
MDC IDC STAT BRADY AS VS PERCENT: 0 %

## 2014-01-01 ENCOUNTER — Encounter: Payer: Self-pay | Admitting: *Deleted

## 2014-01-07 ENCOUNTER — Encounter: Payer: Self-pay | Admitting: Internal Medicine

## 2014-01-08 ENCOUNTER — Encounter: Payer: Self-pay | Admitting: Adult Health

## 2014-01-08 ENCOUNTER — Ambulatory Visit (INDEPENDENT_AMBULATORY_CARE_PROVIDER_SITE_OTHER): Payer: Medicare Other | Admitting: Adult Health

## 2014-01-08 VITALS — BP 142/80 | HR 92 | Temp 97.7°F | Ht 64.5 in | Wt 177.2 lb

## 2014-01-08 DIAGNOSIS — J209 Acute bronchitis, unspecified: Secondary | ICD-10-CM

## 2014-01-08 MED ORDER — CEFDINIR 300 MG PO CAPS: 300.0000 mg | ORAL_CAPSULE | Freq: Two times a day (BID) | ORAL | Status: DC
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
## 2014-01-08 NOTE — Patient Instructions (Signed)
Omnicef Twice daily  For 7 days , take with food.  Mucinex DM Twice daily  As needed  Cough/congestion  Fluids and rest  Saline nasal rinses As needed   Please contact office for sooner follow up if symptoms do not improve or worsen or seek emergency care  Follow up Dr. Lenna Gilford  As planned and As needed

## 2014-01-08 NOTE — Assessment & Plan Note (Signed)
Mild flare   Plan  Omnicef Twice daily  For 7 days , take with food.  Mucinex DM Twice daily  As needed  Cough/congestion  Fluids and rest  Saline nasal rinses As needed   Please contact office for sooner follow up if symptoms do not improve or worsen or seek emergency care  Follow up Dr. Lenna Gilford  As planned and As needed

## 2014-01-09 ENCOUNTER — Telehealth: Payer: Self-pay | Admitting: Pulmonary Disease

## 2014-01-09 MED ORDER — PHENYLEPH-PROMETHAZINE-COD 5-6.25-10 MG/5ML PO SYRP: ORAL_SOLUTION | ORAL | Status: DC

## 2014-01-09 NOTE — Telephone Encounter (Signed)
Pt returned call.  [name] [name] ° °

## 2014-01-09 NOTE — Telephone Encounter (Signed)
Pt aware of recs and rx has been called in. Nothing further needed

## 2014-01-09 NOTE — Telephone Encounter (Signed)
Phenergan VC w/ codeine #8 oz  1 tsp every 6hrs as needed As needed  Cough, may make you sleepy.  No refills  Cont w/ ov recs  Please contact office for sooner follow up if symptoms do not improve or worsen or seek emergency care

## 2014-01-09 NOTE — Telephone Encounter (Signed)
lmomtcb x1 

## 2014-01-09 NOTE — Telephone Encounter (Signed)
I spoke with pt. She is requesting to have something called in for her cough. She reports she has coughed all night long and now her chest is sore from coughing. Pt did start the ABX. Yesterday. Please advise TP thanks Last OV 01/08/14 w/ TP Pending 03/23/14 Allergies  Allergen Reactions  . Sulfa Antibiotics Other (See Comments)    Doesn't like to drink lots of water.  . Sulfonamide Derivatives     REACTION: nausea  . Sumatriptan Other (See Comments)    Reaction unknown

## 2014-01-12 ENCOUNTER — Other Ambulatory Visit: Payer: Self-pay | Admitting: Pulmonary Disease

## 2014-01-16 ENCOUNTER — Encounter: Payer: Self-pay | Admitting: Adult Health

## 2014-01-16 ENCOUNTER — Ambulatory Visit (INDEPENDENT_AMBULATORY_CARE_PROVIDER_SITE_OTHER): Payer: Medicare Other | Admitting: Adult Health

## 2014-01-16 ENCOUNTER — Telehealth: Payer: Self-pay | Admitting: Pulmonary Disease

## 2014-01-16 ENCOUNTER — Ambulatory Visit (INDEPENDENT_AMBULATORY_CARE_PROVIDER_SITE_OTHER)
Admission: RE | Admit: 2014-01-16 | Discharge: 2014-01-16 | Disposition: A | Discharge status: Home / Self Care | Payer: Medicare Other | Source: Ambulatory Visit | Attending: Adult Health | Admitting: Adult Health

## 2014-01-16 VITALS — BP 126/72 | HR 80 | Temp 98.6°F | Ht 64.5 in | Wt 177.2 lb

## 2014-01-16 DIAGNOSIS — J209 Acute bronchitis, unspecified: Secondary | ICD-10-CM

## 2014-01-16 DIAGNOSIS — R05 Cough: Secondary | ICD-10-CM

## 2014-01-16 DIAGNOSIS — R059 Cough, unspecified: Secondary | ICD-10-CM

## 2014-01-16 MED ORDER — PREDNISONE 10 MG PO TABS: ORAL_TABLET | ORAL | Status: DC

## 2014-01-16 MED ORDER — CEFDINIR 300 MG PO CAPS: 300.0000 mg | ORAL_CAPSULE | Freq: Two times a day (BID) | ORAL | Status: DC

## 2014-01-16 NOTE — Patient Instructions (Signed)
Extend Omnicef Twice daily  For 3 days.  Mucinex DM Twice daily  As needed  Cough/congestion  Fluids and rest  Saline nasal rinses As needed   Phenergan w/ codeine cough syrup as needed.  Prednisone taper over next week-take with food.  Please contact office for sooner follow up if symptoms do not improve or worsen or seek emergency care  Follow up Dr. Lenna Gilford  As planned and As needed
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
## 2014-01-16 NOTE — Telephone Encounter (Signed)
Spoke with the pt and she states she has finished all meds prescribed at visit on 01-08-14 and she doe snot feel any improvement, in fact some symptoms are worse. Pt is c/o having wheezing, sob, dry cough. She states she has to take the cough medication every 6 hours on schedule or she cannot control coughing. Appt set this afternoon with TP at 3:45. [place] Bing, CMA

## 2014-01-16 NOTE — Assessment & Plan Note (Signed)
Slow to resolve flare  Check cxr   Plan  Extend Omnicef Twice daily  For 3 days.  Mucinex DM Twice daily  As needed  Cough/congestion  Fluids and rest  Saline nasal rinses As needed   Phenergan w/ codeine cough syrup as needed.  Prednisone taper over next week-take with food.  Please contact office for sooner follow up if symptoms do not improve or worsen or seek emergency care  Follow up Dr. Lenna Gilford  As planned and As needed

## 2014-01-17 ENCOUNTER — Other Ambulatory Visit: Payer: Self-pay | Admitting: Pulmonary Disease

## 2014-01-22 ENCOUNTER — Telehealth: Payer: Self-pay | Admitting: Pulmonary Disease

## 2014-01-22 NOTE — Telephone Encounter (Signed)
lmomtcb x1 

## 2014-01-22 NOTE — Telephone Encounter (Signed)
Pt calling in regards to a letter that she received with lab results of a recent hemocult. Letter states that she had a positive stool culture and was told to contact our office as soon as possible.  Pt asked to bring letter and results up here to our office so that we can do further research. Unable to locate results/order or reason for test in chart. Letter is from a lab called [facility]  Pt to bring all results and letters to our office tomorrow for Leigh to look at and review.  Will fwd message to Leigh to advise.

## 2014-01-29 ENCOUNTER — Telehealth: Payer: Self-pay | Admitting: Pulmonary Disease

## 2014-01-29 NOTE — Telephone Encounter (Signed)
Checked with Leigh and she has not seen any paperwork on this pt.  Spoke with pt and she states that her son was supposed to have brought this by last week.  She states that she will check with him and call us back.  Will await call back

## 2014-01-29 NOTE — Telephone Encounter (Signed)
LMOM x 1 

## 2014-01-29 NOTE — Telephone Encounter (Signed)
Pt states that her son brought it by on Friday and left it with someone at the front desk. She states it had Dr. Brendolyn Patty name on it. [place] Bing, CMA

## 2014-01-29 NOTE — Telephone Encounter (Signed)
Pt returning call to nurse.[name] [name] ° °

## 2014-01-30 NOTE — Telephone Encounter (Signed)
Duplicate, see phone note from 01-22-14. [place] Bing, CMA

## 2014-02-05 NOTE — Telephone Encounter (Signed)
Called spoke with patient to see if there is a way that she can fax these papers to the office or if she or her son can bring them again.  Pt stated that she will bring another copy herself to the office probably next week.  Did advise patient that when she comes to the office to ask if she can personally hand the papers to either Leigh or myself.  Pt okay with this and verbalized her understanding.  Will go ahead and sign off on this message and a new one can be created when pt comes to the office next week.

## 2014-02-05 NOTE — Telephone Encounter (Signed)
These papers are not on our floor.

## 2014-02-06 ENCOUNTER — Encounter: Payer: Self-pay | Admitting: Gastroenterology

## 2014-02-06 ENCOUNTER — Telehealth: Payer: Self-pay | Admitting: *Deleted

## 2014-02-06 NOTE — Telephone Encounter (Signed)
Pt stated that she got these results about a stool test that was positive for blood.  She dropped these papers off for SN to review these results.  SN please advise.  thanks

## 2014-02-06 NOTE — Telephone Encounter (Signed)
Per SN---  On xarelto with HX PE/DVT per Dr. Rodrigo Ran   PUD and Hem;s  With last colon 2001 with Dr. Fuller Plan  Needs OV with Dr. Fuller Plan to eval heme positive stool  i have placed this form in the scan folder to be scanned into the pts chart.    i have called and lmomtcb for the pt to make her aware of SN recs.

## 2014-02-06 NOTE — Telephone Encounter (Signed)
Pt is aware of SN recs. She was given Dr. Lynne Leader # and she is going to call and get an appointment.

## 2014-02-09 ENCOUNTER — Other Ambulatory Visit: Payer: Self-pay | Admitting: Nurse Practitioner

## 2014-02-09 ENCOUNTER — Telehealth: Payer: Self-pay | Admitting: Pulmonary Disease

## 2014-02-09 MED ORDER — METOPROLOL SUCCINATE ER 25 MG PO TB24: 25.0000 mg | ORAL_TABLET | Freq: Every day | ORAL | Status: DC

## 2014-02-09 MED ORDER — CLORAZEPATE DIPOTASSIUM 7.5 MG PO TABS: 7.5000 mg | ORAL_TABLET | Freq: Three times a day (TID) | ORAL | Status: DC

## 2014-02-09 MED ORDER — RIVAROXABAN 20 MG PO TABS: ORAL_TABLET | ORAL | Status: DC

## 2014-02-09 NOTE — Telephone Encounter (Signed)
Rx for 90 day supply of Xarelto sent to [facility] [phone]

## 2014-02-09 NOTE — Telephone Encounter (Signed)
Per SN--  Ok to refill these meds for the pt.  These have been sent to the pharmacy and pt is aware.

## 2014-02-09 NOTE — Telephone Encounter (Signed)
Pt is asking for refills on clorazepate 7.5mg  take 1 tablet three times a day as needed, and metoprolol 25 mg daily. Pt is asking for 90 day supply. Please advise if ok to refill. LAst refill on 10-23-13.  [place] Bing, CMA

## 2014-02-13 ENCOUNTER — Telehealth: Payer: Self-pay | Admitting: Pulmonary Disease

## 2014-02-13 NOTE — Telephone Encounter (Signed)
Called and spoke with pharmacy and they had to call to get the ok to refill the metoprolol and the clorazepate.  i explained that the request had been  Faxed back to them .  This has been completed and nothing further is needed.

## 2014-02-19 ENCOUNTER — Other Ambulatory Visit: Payer: Self-pay | Admitting: Pulmonary Disease

## 2014-02-19 DIAGNOSIS — I10 Essential (primary) hypertension: Secondary | ICD-10-CM

## 2014-02-24 ENCOUNTER — Encounter: Payer: Self-pay | Admitting: Nurse Practitioner

## 2014-02-24 ENCOUNTER — Telehealth: Payer: Self-pay | Admitting: Hematology & Oncology

## 2014-02-24 NOTE — Progress Notes (Signed)
Prior Auth paperwork and form faxed back to [facility] for her Xarelto. All supporting documents were attached and sent to [phone]. Faxed confirmation received.

## 2014-02-24 NOTE — Progress Notes (Signed)
Received a notice from [facility] pts, Xarelto has been approved through 02/24/15 under her Medicare Part D.

## 2014-02-24 NOTE — Telephone Encounter (Signed)
[name] [name] has been approved for 20 tabs through 02/24/2015 under Medicare Part D benefit.      COPY SCANNED

## 2014-03-11 ENCOUNTER — Ambulatory Visit (INDEPENDENT_AMBULATORY_CARE_PROVIDER_SITE_OTHER): Payer: Medicare Other | Admitting: Gastroenterology

## 2014-03-11 ENCOUNTER — Encounter: Payer: Self-pay | Admitting: Gastroenterology

## 2014-03-11 VITALS — BP 140/90 | HR 96 | Ht 64.5 in | Wt 182.4 lb

## 2014-03-11 DIAGNOSIS — R195 Other fecal abnormalities: Secondary | ICD-10-CM

## 2014-03-11 DIAGNOSIS — Z7901 Long term (current) use of anticoagulants: Secondary | ICD-10-CM

## 2014-03-11 NOTE — Progress Notes (Signed)
History of Present Illness: This is a 78 year old female with multiple serious medical problems as outlined below. Heme + stool noted on home screening per patient. Last Hb in chart is 12.3. Last colonoscopy in 2001 was normal except for internal and external hemorrhoids. Denies weight loss, abdominal pain, constipation, diarrhea, change in stool caliber, melena, hematochezia, nausea, vomiting, dysphagia, reflux symptoms, chest pain.  Allergies  Allergen Reactions  . Imitrex [Sumatriptan] Other (See Comments)    Reaction unknown  . Sulfa Antibiotics Other (See Comments)    Doesn't like to drink lots of water.  . Sulfonamide Derivatives     REACTION: nausea   Outpatient Prescriptions Prior to Visit  Medication Sig Dispense Refill  . acetaminophen (TYLENOL) 325 MG tablet Take 2 tablets (650 mg total) by mouth every 6 (six) hours as needed.  60 tablet  0  . cetirizine (ZYRTEC) 10 MG tablet Take 10 mg by mouth daily.       . cholecalciferol (VITAMIN D) 1000 UNITS tablet Take 1,000 Units by mouth daily.      . clorazepate (TRANXENE) 7.5 MG tablet Take 1 tablet (7.5 mg total) by mouth 3 (three) times daily. As needed for nerves  270 tablet  1  . FLOVENT HFA 44 MCG/ACT inhaler Inhale 2 puffs two times  daily  32.6 g  6  . folic acid (FOLVITE) 712 MCG tablet Take 400 mcg by mouth daily.      . furosemide (LASIX) 20 MG tablet Take 2 tablets by mouth  every morning  180 tablet  2  . ipratropium-albuterol (DUONEB) 0.5-2.5 (3) MG/3ML SOLN Take 3 mLs by nebulization 2 (two) times daily.  180 mL  5  . metoprolol succinate (TOPROL-XL) 25 MG 24 hr tablet Take 1 tablet (25 mg total) by mouth daily before breakfast.  90 tablet  1  . Multiple Vitamins-Minerals (PROTEGRA CARDIO PO) Take 1 tablet by mouth daily.      [name] Kitchen NASONEX 50 MCG/ACT nasal spray Place 2 sprays into the  nose 2 (two) times daily.  102 g  1  . pantoprazole (PROTONIX) 40 MG tablet Take 1 tablet (40 mg total) by mouth 2 (two) times daily.   180 tablet  3  . Polyethyl Glycol-Propyl Glycol (SYSTANE) 0.4-0.3 % SOLN Place 2 drops into both eyes 4 (four) times daily.       . pravastatin (PRAVACHOL) 40 MG tablet Take 40 mg by mouth at bedtime.      . Rivaroxaban (XARELTO) 20 MG TABS tablet Take 1 tablet by mouth  daily  90 tablet  3  . sennosides-docusate sodium (SENOKOT-S) 8.6-50 MG tablet Take 2 tablets by mouth at bedtime.       . cefdinir (OMNICEF) 300 MG capsule Take 1 capsule (300 mg total) by mouth 2 (two) times daily.  6 capsule  0  . Phenyleph-Promethazine-Cod 5-6.25-10 MG/5ML SYRP 1 tsp every 6 hours as needed for cough. May make you sleepy.  240 mL  0  . polyethylene glycol (MIRALAX / GLYCOLAX) packet Take 17 g by mouth daily as needed.  14 each  0  . predniSONE (DELTASONE) 10 MG tablet 4 tabs for 2 days, then 3 tabs for 2 days, 2 tabs for 2 days, then 1 tab for 2 days, then stop  20 tablet  0   No facility-administered medications prior to visit.   Past Medical History  Diagnosis Date  . Allergic rhinitis   . Sarcoidosis   . Pulmonary hypertension   .  Asthmatic bronchitis   . Pulmonary embolism   . Hypertension   . CAD (coronary artery disease)   . Cardiac pacemaker in situ   . Venous insufficiency   . Hyperlipidemia   . Obesity   . PUD (peptic ulcer disease)   . Hemorrhoids   . Renal insufficiency   . UTI (lower urinary tract infection)   . DJD (degenerative joint disease)   . Cervicalgia   . Lumbar back pain   . Anxiety   . Myocardial infarction 2012  . CHF (congestive heart failure) 2012  . COPD (chronic obstructive pulmonary disease)   . Shortness of breath     OCCASIONAL  . GERD (gastroesophageal reflux disease)   . Cancer     VULVAR CANCER  . Pacemaker   . Stroke 2006    balance problems remain  . OSA (obstructive sleep apnea)     USES O2 AT HOME WITH NASAL MASK  . Gallstones    Past Surgical History  Procedure Laterality Date  . Appendectomy    . Cholecystectomy  01/1996    Dr.  Janan Halter  . Abdominal hysterectomy    . Lumbar disc surgery  1990    X2  . Left knee arthroscopy    . Ivc filter placed  07/2010  . Pacemaker insertion  2008  . Vulvar lesion removal  07/16/2012    Procedure: VULVAR LESION;  Surgeon: Imagene Gurney A. Alycia Rossetti, MD;  Location: [facility];  Service: Gynecology;  Laterality: Right;  Wide Local Excision of the Vulvar  . Total knee arthroplasty Left 04/21/2013    Procedure: LEFT TOTAL KNEE ARTHROPLASTY;  Surgeon: Gearlean Alf, MD;  Location: [facility];  Service: Orthopedics;  Laterality: Left;   History   Social History  . Marital Status: Widowed    Spouse Name: N/A    Number of Children: 4  . Years of Education: N/A   Occupational History  . retired    Social History Main Topics  . Smoking status: Never Smoker   . Smokeless tobacco: Never Used  . Alcohol Use: No  . Drug Use: No  . Sexual Activity: None   Other Topics Concern  . None   Social History Narrative  . None   Family History  Problem Relation Age of Onset  . Heart attack Father   . Breast cancer Sister     mets  . Heart attack Brother   . Kidney failure Sister     Review of Systems: Pertinent positive and negative review of systems were noted in the above HPI section. All other review of systems were otherwise negative.   Physical Exam: General: Well developed , well nourished, no acute distress Head: Normocephalic and atraumatic Eyes:  sclerae anicteric, EOMI Ears: Normal auditory acuity Mouth: No deformity or lesions Neck: Supple, no masses or thyromegaly Lungs: Clear throughout to auscultation Heart: Regular rate and rhythm; no murmurs, rubs or bruits Abdomen: Soft, non tender and non distended. No masses, hepatosplenomegaly or hernias noted. Normal Bowel sounds Musculoskeletal: Symmetrical with no gross deformities  Skin: No lesions on visible extremities Pulses:  Normal pulses noted Extremities: No clubbing, cyanosis, edema or deformities noted Neurological:  Alert oriented x 4, grossly nonfocal Cervical Nodes:  No significant cervical adenopathy Inguinal Nodes: No significant inguinal adenopathy Psychological:  Alert and cooperative. Normal mood and affect  Assessment and Recommendations:  1. Heme + stool on Xarelto with multiple serious medical problems who is at a much higher risk for complications with colonoscopy  and sedation. After the risks, benefits and alternatives were discussed regarding colonoscopy or barium enema she is comfortable proceeding with a barium enema. If findings on the barium enema indicate a strong need for colonoscopy we can discuss proceeding with colonoscopy off Xarelto with clearance from her prescribing physician. Given her age and comorbidities she is not a good candidate for colorectal cancer screening with Hemoccult, Cologuard, Hemosure or colonoscopy.

## 2014-03-11 NOTE — Patient Instructions (Signed)
Barium Enema Prep  Please purchase the following items from the laxative section of your drug store 1.2 oz packet of effervescent Magnesium Citrate  4 Bisacodyl Tablets-Enteric coated 1 Bisacodyl Suppository   Day before exam On the day before exam you will need to be on a clear liquid diet.   NO solid foods or dairy products are allowed.  You may have unlimited quantities of the following   Broth Clear jello/gelatin with no whole foods added (no red) soft drinks, gatorade, water black coffee or tea (no milk or cream) No red jello.   popsicles  1.  Starting at 12:00 noon drink an 8 oz glass water every hour.  2.  Around 5:30 pm- mix the packet of Magnesium citrate with 8 oz of cold liquid (water). Stir gently. After fizzing stops, stir gently and drink entire contents.     (This should produce a bowel movement in 30 minutes to 6 hours.) 3.  At 7:30 pm take the four (4)  Bisacodyl Tablets with on (1) full 8 oz glass of water.        (Usually produces a bowel movement in 6-12 hours.) 4.  Continue drinking 1 8 oz glass of water every hour till abut 9:00 pm.    Day of exam 1.  Do Not Eat or Drink Anything 2.  If you have a colostomy do not take the suppository. 3.  2 hours before the exam, unwrap the foil wrapper from bisacodyl suppository and discard the foil.  While lying on your side with thigh elevated, insert the suppository into the rectum and gently push in as far as possible. Retain the suppository for at least 15 minutes, if possible, before evacuating, even if the urge is strong.      Bowel evacuation usually occurs within 15 to 60 minutes. Patients requiring  assistance should have a bed pan, commode or help readily available.   Report to the radiology department at [facility] on 03/13/14 at 9:45 am.   Please have letter of positive Hemoccults either faxed to our office at [phone] or brought by our office and given to [facility].   Thank you for choosing me and  [facility].  Pricilla Riffle. Dagoberto Ligas., MD., Marval Regal

## 2014-03-12 ENCOUNTER — Telehealth: Payer: Self-pay | Admitting: Gastroenterology

## 2014-03-12 NOTE — Telephone Encounter (Signed)
Barium Enema Prep  You have been scheduled for a Barium Enema.  Your procedure time is 03/13/14 am/pm at 10:00am and register in the radiology department at [facility] will need to arrive 15 minutes prior to your scheduled appointment time.  Be prepared to spend 1 to 2  hours in the Radiology Department. You will need to purchase a bottle of Magnesium Citrate, Dulcolax tablets and Dulcolax suppository from the laxative section of your drug store.    It is very important to follow all the below instructions.  Failure to follow these instructions may result in a study that is less than optimal and may lead to the necessity of repeating the examination.    On the day before your examination:03/12/14 12:00 Lunch- This meal may include clear broth, white chicken meat sandwich, (no butter, lettuce or other additives), or two hard boiled eggs, strained fruit juice, jello (not containing nuts of fruit).  Coffee,  tea (no milk or cream), water or carbonated beverages.    1:00 Drink one full glass or more of water  3:00 Drink one full glass or more of water  5:00 Have a clear liquid supper.  Clear liquids include: water, ice, tea/coffee (sugar is ok, but no milk or cream), juice (apple, white grape, white cranberry), clear bullion, consomme, broth, strained chicken noodle soup, jello, popsicles, powdered fruit flavored drinks, gatorade, lemonade, carbonated beverages, hard candy.  7:00 Drink one full or more glass of water  8:00  Drink a bottle of Magnesium Citrate  10:00 Take 3 DULCOLAX tablets with one full glass or more of water  On the Day of your examination:03/13/14  No breakfast except coffee or tea (without milk or cream) clear strained fruit juice  7:00 am Drink one full glass of water.  Insert the DULCOLAX Suppository into the rectum.

## 2014-03-12 NOTE — Telephone Encounter (Signed)
Patient given new instructions because prep that was given yesterday was an old barium enema prep.

## 2014-03-12 NOTE — Telephone Encounter (Signed)
Left a message for patient to call me. 

## 2014-03-13 ENCOUNTER — Ambulatory Visit (HOSPITAL_COMMUNITY)
Admission: RE | Admit: 2014-03-13 | Discharge: 2014-03-13 | Disposition: A | Discharge status: Home / Self Care | Payer: Medicare Other | Source: Ambulatory Visit | Attending: Gastroenterology | Admitting: Gastroenterology

## 2014-03-13 DIAGNOSIS — Z8719 Personal history of other diseases of the digestive system: Secondary | ICD-10-CM | POA: Insufficient documentation

## 2014-03-13 DIAGNOSIS — K5730 Diverticulosis of large intestine without perforation or abscess without bleeding: Secondary | ICD-10-CM | POA: Insufficient documentation

## 2014-03-13 DIAGNOSIS — R195 Other fecal abnormalities: Secondary | ICD-10-CM | POA: Insufficient documentation

## 2014-03-13 DIAGNOSIS — Z862 Personal history of diseases of the blood and blood-forming organs and certain disorders involving the immune mechanism: Secondary | ICD-10-CM | POA: Insufficient documentation

## 2014-03-13 DIAGNOSIS — M479 Spondylosis, unspecified: Secondary | ICD-10-CM | POA: Insufficient documentation

## 2014-03-13 DIAGNOSIS — Z9089 Acquired absence of other organs: Secondary | ICD-10-CM | POA: Insufficient documentation

## 2014-03-23 ENCOUNTER — Ambulatory Visit: Payer: Medicare Other | Admitting: Pulmonary Disease

## 2014-03-27 ENCOUNTER — Telehealth: Payer: Self-pay | Admitting: Pulmonary Disease

## 2014-03-27 NOTE — Telephone Encounter (Signed)
Will wait for pt to call back on Monday.

## 2014-03-30 NOTE — Telephone Encounter (Signed)
Pt called back this morning and she is aware of appt that has been moved from 5/26 at 4 pm to 5/26 at 10.  Nothing further is needed.

## 2014-04-01 ENCOUNTER — Encounter: Payer: Self-pay | Admitting: Internal Medicine

## 2014-04-01 ENCOUNTER — Ambulatory Visit (INDEPENDENT_AMBULATORY_CARE_PROVIDER_SITE_OTHER): Payer: Medicare Other | Admitting: *Deleted

## 2014-04-01 DIAGNOSIS — I442 Atrioventricular block, complete: Secondary | ICD-10-CM

## 2014-04-01 DIAGNOSIS — I498 Other specified cardiac arrhythmias: Secondary | ICD-10-CM

## 2014-04-01 LAB — MDC_IDC_ENUM_SESS_TYPE_REMOTE
Battery Impedance: 1779 Ohm
Battery Remaining Longevity: 33 mo
Battery Voltage: 2.76 V
Brady Statistic AP VS Percent: 0 %
Brady Statistic AS VP Percent: 88 %
Date Time Interrogation Session: 20150415114451
Lead Channel Impedance Value: 513 Ohm
Lead Channel Pacing Threshold Amplitude: 1.25 V
Lead Channel Pacing Threshold Pulse Width: 0.4 ms
Lead Channel Pacing Threshold Pulse Width: 0.4 ms
Lead Channel Setting Pacing Amplitude: 2 V
Lead Channel Setting Pacing Amplitude: 2.5 V
Lead Channel Setting Sensing Sensitivity: 4 mV
MDC IDC MSMT LEADCHNL RA IMPEDANCE VALUE: 383 Ohm
MDC IDC MSMT LEADCHNL RA PACING THRESHOLD AMPLITUDE: 0.5 V
MDC IDC MSMT LEADCHNL RA SENSING INTR AMPL: 2.8 mV
MDC IDC SET LEADCHNL RV PACING PULSEWIDTH: 0.4 ms
MDC IDC STAT BRADY AP VP PERCENT: 12 %
MDC IDC STAT BRADY AS VS PERCENT: 0 %

## 2014-04-14 ENCOUNTER — Encounter (HOSPITAL_COMMUNITY): Payer: Self-pay | Admitting: Orthopedic Surgery

## 2014-04-22 ENCOUNTER — Encounter: Payer: Self-pay | Admitting: Cardiology

## 2014-04-29 ENCOUNTER — Encounter: Payer: Self-pay | Admitting: Internal Medicine

## 2014-04-29 ENCOUNTER — Ambulatory Visit (INDEPENDENT_AMBULATORY_CARE_PROVIDER_SITE_OTHER): Payer: Medicare Other | Admitting: Internal Medicine

## 2014-04-29 VITALS — BP 132/82 | HR 85 | Temp 97.2°F | Ht 64.5 in | Wt 181.2 lb

## 2014-04-29 DIAGNOSIS — F411 Generalized anxiety disorder: Secondary | ICD-10-CM

## 2014-04-29 DIAGNOSIS — R7309 Other abnormal glucose: Secondary | ICD-10-CM

## 2014-04-29 DIAGNOSIS — Z23 Encounter for immunization: Secondary | ICD-10-CM

## 2014-04-29 DIAGNOSIS — J309 Allergic rhinitis, unspecified: Secondary | ICD-10-CM

## 2014-04-29 DIAGNOSIS — I10 Essential (primary) hypertension: Secondary | ICD-10-CM

## 2014-04-29 DIAGNOSIS — R7302 Impaired glucose tolerance (oral): Secondary | ICD-10-CM

## 2014-04-29 MED ORDER — METHYLPREDNISOLONE ACETATE 80 MG/ML IJ SUSP
80.0000 mg | Freq: Once | INTRAMUSCULAR | Status: AC
  Administered 2014-04-29: 80 mg via INTRAMUSCULAR

## 2014-04-29 NOTE — Assessment & Plan Note (Signed)
stable overall by history and exam, recent data reviewed with pt, and pt to continue medical treatment as before,  to f/u any worsening symptoms or concerns BP Readings from Last 3 Encounters:  04/29/14 132/82  03/11/14 140/90  01/16/14 126/72

## 2014-04-29 NOTE — Assessment & Plan Note (Signed)
Cont med, ok for allergy referral per pt request

## 2014-04-29 NOTE — Progress Notes (Signed)
Subjective:    Patient ID: [name] [name], female    DOB: Oct 24, 1936, 78 y.o.   MRN: [id number]  HPI  Here to establish new; overall doing ok,  Pt denies chest pain, increased sob or doe, wheezing, orthopnea, PND, increased LE swelling, palpitations, dizziness or syncope.  Pt denies polydipsia, polyuria, or low sugar symptoms such as weakness or confusion improved with po intake.  Pt denies new neurological symptoms such as new headache, or facial or extremity weakness or numbness.   Pt states overall good compliance with meds, has been trying to follow lower cholesterol diet, with wt overall stable,  but little exercise however. Plans to try to do better. Does have several wks ongoing nasal allergy symptoms with clearish congestion, itch and sneezing, without fever, pain, ST, cough, swelling or wheezing, all despite current meds, asks for allergy referral. Due for prevnar as well.  Denies worsening depressive symptoms, suicidal ideation, or panic; has ongoing anxiety Past Medical History  Diagnosis Date  . Allergic rhinitis   . Sarcoidosis   . Pulmonary hypertension   . Asthmatic bronchitis   . Pulmonary embolism   . Hypertension   . CAD (coronary artery disease)   . Cardiac pacemaker in situ   . Venous insufficiency   . Hyperlipidemia   . Obesity   . PUD (peptic ulcer disease)   . Hemorrhoids   . Renal insufficiency   . UTI (lower urinary tract infection)   . DJD (degenerative joint disease)   . Cervicalgia   . Lumbar back pain   . Anxiety   . Myocardial infarction 2012  . CHF (congestive heart failure) 2012  . COPD (chronic obstructive pulmonary disease)   . Shortness of breath     OCCASIONAL  . GERD (gastroesophageal reflux disease)   . Cancer     VULVAR CANCER  . Pacemaker   . Stroke 2006    balance problems remain  . OSA (obstructive sleep apnea)     USES O2 AT HOME WITH NASAL MASK  . Gallstones   . ALLERGIC RHINITIS 12/10/2007    Qualifier: Diagnosis of  By: Lenna Gilford  MD, [place] 09/30/2007    Qualifier: Diagnosis of  By: Rosana Hoes CMA, Tammy    . ASTHMATIC BRONCHITIS, ACUTE 09/30/2007    Qualifier: History of  By: Rosana Hoes CMA, Tammy    . BACK PAIN, LUMBAR 09/30/2007    Qualifier: Diagnosis of  By: Rosana Hoes CMA, Tammy    . CIN III (cervical intraepithelial neoplasia III) 05/28/2012  . CORONARY ARTERY DISEASE 09/05/2008    Qualifier: Diagnosis of  By: Lenna Gilford MD, [place], LEG, RIGHT 08/29/2010    Qualifier: Diagnosis of  By: Royal Piedra NP, Tammy    . DEGENERATIVE JOINT DISEASE 01/05/2009    Qualifier: Diagnosis of  By: Lenna Gilford MD, [place] 09/30/2007    Qualifier: Diagnosis of  By: Rosana Hoes CMA, [place]    . HYPERLIPIDEMIA 09/05/2008    Qualifier: Diagnosis of  By: Lenna Gilford MD, Deborra Medina   . HYPERTENSION 12/10/2007    Qualifier: Diagnosis of  By: Lenna Gilford MD, Deborra Medina   . OA (osteoarthritis) of knee 04/21/2013  . OBESITY 12/10/2007    Qualifier: Diagnosis of  By: Lenna Gilford MD, [place] 07/11/2009    Qualifier: Diagnosis of  By: Lenna Gilford MD, Deborra Medina   . Postop Hypokalemia 04/22/2013  . Postoperative anemia due to acute  blood loss 04/22/2013  . PUD 12/10/2007    Qualifier: History of  By: Lenna Gilford MD, Deborra Medina   . PULMONARY EMBOLISM 07/22/2010    Qualifier: Diagnosis of  By: Lenna Gilford MD, Deborra Medina   . PULMONARY HYPERTENSION 09/30/2007    Qualifier: Diagnosis of  By: Rosana Hoes CMA, Tammy    . RENAL INSUFFICIENCY 08/17/2010    Qualifier: Diagnosis of  By: Lenna Gilford MD, [place] 09/30/2007    Qualifier: History of  By: Rosana Hoes CMA, Tammy    . SYNCOPE 12/11/2007    Qualifier: History of  By: Lenna Gilford MD, Deborra Medina   . UTI 08/17/2010    Qualifier: Diagnosis of  By: Lenna Gilford MD, Deborra Medina   . VENOUS INSUFFICIENCY 12/10/2007    Qualifier: Diagnosis of  By: Lenna Gilford MD, Deborra Medina   . Impaired glucose tolerance 04/29/2014   Past Surgical History  Procedure Laterality Date  . Appendectomy    . Cholecystectomy  01/1996    Dr.  Janan Halter  . Abdominal hysterectomy    . Lumbar disc surgery  1990    X2  . Left knee arthroscopy    . Ivc filter placed  07/2010  . Pacemaker insertion  2008  . Vulvar lesion removal  07/16/2012    Procedure: VULVAR LESION;  Surgeon: Imagene Gurney A. Alycia Rossetti, MD;  Location: [facility];  Service: Gynecology;  Laterality: Right;  Wide Local Excision of the Vulvar  . Total knee arthroplasty Left 04/21/2013    Procedure: LEFT TOTAL KNEE ARTHROPLASTY;  Surgeon: Gearlean Alf, MD;  Location: [facility];  Service: Orthopedics;  Laterality: Left;    reports that she has never smoked. She has never used smokeless tobacco. She reports that she does not drink alcohol or use illicit drugs. family history includes Breast cancer in her sister; Heart attack in her brother and father; Kidney failure in her sister. Allergies  Allergen Reactions  . Imitrex [Sumatriptan] Other (See Comments)    Reaction unknown  . Sulfa Antibiotics Other (See Comments)    Doesn't like to drink lots of water.  . Sulfonamide Derivatives     REACTION: nausea   Current Outpatient Prescriptions on File Prior to Visit  Medication Sig Dispense Refill  . acetaminophen (TYLENOL) 325 MG tablet Take 2 tablets (650 mg total) by mouth every 6 (six) hours as needed.  60 tablet  0  . cetirizine (ZYRTEC) 10 MG tablet Take 10 mg by mouth daily.       . cholecalciferol (VITAMIN D) 1000 UNITS tablet Take 1,000 Units by mouth daily.      . clorazepate (TRANXENE) 7.5 MG tablet Take 1 tablet (7.5 mg total) by mouth 3 (three) times daily. As needed for nerves  270 tablet  1  . FLOVENT HFA 44 MCG/ACT inhaler Inhale 2 puffs two times  daily  17.4 g  6  . folic acid (FOLVITE) 081 MCG tablet Take 400 mcg by mouth daily.      . furosemide (LASIX) 20 MG tablet Take 2 tablets by mouth  every morning  180 tablet  2  . ipratropium-albuterol (DUONEB) 0.5-2.5 (3) MG/3ML SOLN Take 3 mLs by nebulization 2 (two) times daily.  180 mL  5  . metoprolol succinate  (TOPROL-XL) 25 MG 24 hr tablet Take 1 tablet (25 mg total) by mouth daily before breakfast.  90 tablet  1  . Multiple Vitamin (MULTIVITAMIN) tablet Take 1 tablet by mouth daily.      . Multiple Vitamins-Minerals (PROTEGRA  CARDIO PO) Take 1 tablet by mouth daily.      [name] Kitchen NASONEX 50 MCG/ACT nasal spray Place 2 sprays into the  nose 2 (two) times daily.  102 g  1  . pantoprazole (PROTONIX) 40 MG tablet Take 1 tablet (40 mg total) by mouth 2 (two) times daily.  180 tablet  3  . Polyethyl Glycol-Propyl Glycol (SYSTANE) 0.4-0.3 % SOLN Place 2 drops into both eyes 4 (four) times daily.       . pravastatin (PRAVACHOL) 40 MG tablet Take 40 mg by mouth at bedtime.      . Rivaroxaban (XARELTO) 20 MG TABS tablet Take 1 tablet by mouth  daily  90 tablet  3  . sennosides-docusate sodium (SENOKOT-S) 8.6-50 MG tablet Take 2 tablets by mouth at bedtime.        No current facility-administered medications on file prior to visit.   Review of Systems  Constitutional: Negative for unusual diaphoresis or other sweats  HENT: Negative for ringing in ear Eyes: Negative for double vision or worsening visual disturbance.  Respiratory: Negative for choking and stridor.   Gastrointestinal: Negative for vomiting or other signifcant bowel change Genitourinary: Negative for hematuria or decreased urine volume.  Musculoskeletal: Negative for other MSK pain or swelling Skin: Negative for color change and worsening wound.  Neurological: Negative for tremors and numbness other than noted  Psychiatric/Behavioral: Negative for decreased concentration or agitation other than above       Objective:   Physical Exam BP 132/82  Pulse 85  Temp(Src) 97.2 F (36.2 C) (Oral)  Ht 5' 4.5" (1.638 m)  Wt 181 lb 3.2 oz (82.192 kg)  BMI 30.63 kg/m2  SpO2 95% VS noted,  Constitutional: Pt appears well-developed, well-nourished.  HENT: Head: NCAT.  Right Ear: External ear normal.  Left Ear: External ear normal.  Eyes: . Pupils are  equal, round, and reactive to light. Conjunctivae and EOM are normal Neck: Normal range of motion. Neck supple.  Cardiovascular: Normal rate and regular rhythm.   Pulmonary/Chest: Effort normal and breath sounds normal.  Abd:  Soft, NT, ND, + BS Neurological: Pt is alert. Not confused , motor grossly intact Skin: Skin is warm. No rash Psychiatric: Pt behavior is normal. No agitation.     Assessment & Plan:

## 2014-04-29 NOTE — Addendum Note (Signed)
Addended by: Earnstine Regal on: 04/29/2014 11:39 AM   Modules accepted: Orders

## 2014-04-29 NOTE — Progress Notes (Signed)
Pre visit review using our clinic review tool, if applicable. No additional management support is needed unless otherwise documented below in the visit note. 

## 2014-04-29 NOTE — Assessment & Plan Note (Signed)
stable overall by history and exam, recent data reviewed with pt, and pt to continue medical treatment as before,  to f/u any worsening symptoms or concerns Lab Results  Component Value Date   WBC 6.7 09/23/2013   HGB 12.3 09/23/2013   HCT 36.8 09/23/2013   PLT 184.0 09/23/2013   GLUCOSE 81 09/23/2013   CHOL 149 09/23/2013   TRIG 115.0 09/23/2013   HDL 42.00 09/23/2013   LDLCALC 84 09/23/2013   ALT 12 09/23/2013   AST 21 09/23/2013   NA 144 09/23/2013   K 3.8 09/23/2013   CL 106 09/23/2013   CREATININE 0.8 09/23/2013   BUN 15 09/23/2013   CO2 29 09/23/2013   TSH 2.71 09/23/2013   INR 1.20 04/15/2013   HGBA1C 5.8 07/06/2009

## 2014-04-29 NOTE — Assessment & Plan Note (Signed)
stable overall by history and exam, recent data reviewed with pt, and pt to continue medical treatment as before,  to f/u any worsening symptoms or concerns Lab Results  Component Value Date   HGBA1C 5.8 07/06/2009

## 2014-04-29 NOTE — Patient Instructions (Addendum)
You had the new Prevnar pneumonia shot today  You had the steroid shot today  You will be contacted regarding the referral for: Allergist  Please continue all other medications as before, and refills have been done if requested. Please have the pharmacy call with any other refills you may need.  Please continue your efforts at being more active, low cholesterol diet, and weight control.  You are otherwise up to date with prevention measures today.  No further lab work needed today  You are given the prescription for the shingles shot  Please remember to sign up for MyChart if you have not done so, as this will be important to you in the future with finding out test results, communicating by private email, and scheduling acute appointments online when needed.  Please return in 6 months, or sooner if needed

## 2014-05-12 ENCOUNTER — Encounter: Payer: Self-pay | Admitting: Pulmonary Disease

## 2014-05-12 ENCOUNTER — Ambulatory Visit (INDEPENDENT_AMBULATORY_CARE_PROVIDER_SITE_OTHER): Payer: Medicare Other | Admitting: Pulmonary Disease

## 2014-05-12 ENCOUNTER — Ambulatory Visit: Payer: Medicare Other | Admitting: Pulmonary Disease

## 2014-05-12 VITALS — BP 128/88 | HR 84 | Temp 98.5°F | Ht 64.5 in | Wt 181.4 lb

## 2014-05-12 DIAGNOSIS — I2510 Atherosclerotic heart disease of native coronary artery without angina pectoris: Secondary | ICD-10-CM

## 2014-05-12 DIAGNOSIS — G4733 Obstructive sleep apnea (adult) (pediatric): Secondary | ICD-10-CM

## 2014-05-12 DIAGNOSIS — Z950 Presence of cardiac pacemaker: Secondary | ICD-10-CM

## 2014-05-12 DIAGNOSIS — I2699 Other pulmonary embolism without acute cor pulmonale: Secondary | ICD-10-CM

## 2014-05-12 DIAGNOSIS — J309 Allergic rhinitis, unspecified: Secondary | ICD-10-CM

## 2014-05-12 DIAGNOSIS — D869 Sarcoidosis, unspecified: Secondary | ICD-10-CM

## 2014-05-12 DIAGNOSIS — I10 Essential (primary) hypertension: Secondary | ICD-10-CM

## 2014-05-12 DIAGNOSIS — I2789 Other specified pulmonary heart diseases: Secondary | ICD-10-CM

## 2014-05-12 MED ORDER — FIRST-DUKES MOUTHWASH MT SUSP: OROMUCOSAL | Status: DC

## 2014-05-12 NOTE — Progress Notes (Addendum)
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 MG TABLET    Take 1 tablet (40 mg total) by mouth 2 (two) times daily.   POLYETHYL GLYCOL-PROPYL GLYCOL (SYSTANE) 0.4-0.3 % SOLN    Place 2 drops into both eyes 4 (four) times daily.    PRAVASTATIN (PRAVACHOL) 40 MG TABLET    Take 40 mg by mouth at bedtime.   RIVAROXABAN (XARELTO) 20 MG TABS TABLET    Take 1 tablet by mouth  daily   SENNOSIDES-DOCUSATE SODIUM (SENOKOT-S) 8.6-50 MG TABLET    Take 2 tablets by mouth at bedtime.   Modified Medications   No medications on file  Discontinued Medications   No medications on file

## 2014-05-12 NOTE — Patient Instructions (Signed)
Today we updated your med list in our EPIC system...    Continue your current medications the same...  Today we did your follow up Pulmonary Function Test...    We will arrange for a follow up Echocardiogram to check your heart pump function...       We will contact you w/ the results when available...   Call for any questions...  Let's plan a follow up visit in 51mo, sooner if needed for breathing problems.[name] KitchenMarland Kitchen

## 2014-05-19 ENCOUNTER — Ambulatory Visit (HOSPITAL_COMMUNITY): Payer: Medicare Other | Attending: Cardiology | Admitting: Radiology

## 2014-05-19 DIAGNOSIS — E785 Hyperlipidemia, unspecified: Secondary | ICD-10-CM | POA: Insufficient documentation

## 2014-05-19 DIAGNOSIS — I2789 Other specified pulmonary heart diseases: Secondary | ICD-10-CM

## 2014-05-19 DIAGNOSIS — I270 Primary pulmonary hypertension: Secondary | ICD-10-CM | POA: Insufficient documentation

## 2014-05-19 DIAGNOSIS — I10 Essential (primary) hypertension: Secondary | ICD-10-CM | POA: Insufficient documentation

## 2014-05-19 DIAGNOSIS — I2510 Atherosclerotic heart disease of native coronary artery without angina pectoris: Secondary | ICD-10-CM | POA: Insufficient documentation

## 2014-05-19 NOTE — Progress Notes (Signed)
Echocardiogram performed.  

## 2014-06-09 ENCOUNTER — Encounter: Payer: Self-pay | Admitting: Internal Medicine

## 2014-06-09 ENCOUNTER — Other Ambulatory Visit: Payer: Medicare Other

## 2014-06-09 ENCOUNTER — Ambulatory Visit (INDEPENDENT_AMBULATORY_CARE_PROVIDER_SITE_OTHER): Payer: Medicare Other | Admitting: Internal Medicine

## 2014-06-09 VITALS — BP 142/92 | HR 102 | Ht 64.5 in | Wt 179.6 lb

## 2014-06-09 DIAGNOSIS — J3089 Other allergic rhinitis: Principal | ICD-10-CM

## 2014-06-09 DIAGNOSIS — J4520 Mild intermittent asthma, uncomplicated: Secondary | ICD-10-CM | POA: Insufficient documentation

## 2014-06-09 DIAGNOSIS — J302 Other seasonal allergic rhinitis: Secondary | ICD-10-CM

## 2014-06-09 DIAGNOSIS — I2699 Other pulmonary embolism without acute cor pulmonale: Secondary | ICD-10-CM

## 2014-06-09 DIAGNOSIS — IMO0001 Reserved for inherently not codable concepts without codable children: Secondary | ICD-10-CM | POA: Insufficient documentation

## 2014-06-09 DIAGNOSIS — J45909 Unspecified asthma, uncomplicated: Secondary | ICD-10-CM

## 2014-06-09 DIAGNOSIS — J309 Allergic rhinitis, unspecified: Secondary | ICD-10-CM

## 2014-06-09 NOTE — Assessment & Plan Note (Signed)
Chronic Xarelto 

## 2014-06-09 NOTE — Assessment & Plan Note (Signed)
Controlled Plan-okay to skip nebulizer doses to see if she really needs them

## 2014-06-09 NOTE — Progress Notes (Signed)
06/09/14- 70 yof never smoker referred courtesy of Dr Blair Heys for allergy evaluation.  Hx OSA- Dr Gwenette Greet. Dr Lenna Gilford has seen for pulmonary- hx sarcoid, DVT/PE, CAD/ Pacemaker, asthmatic bronchitis, allergic rhinitis, cancer cervix Patient complains of sinus congestion and morning rhinorrhea x30 years or more. Triggers include spring and fall pollens, weather changes. Blames drainage for hoarseness and sneezing. Wears oxygen 2.5 L at night with humidifier on her oxygen. Skin testing 10 years ago with allergy shots reported helpful x2 years. Now in the past 3-5 years she has had more problems with nasal pressure, congestion, sneezing and drainage. Chest tightness and dyspnea managed with nebulizer twice daily, Flovent 44. She says she has not been told she has "asthma". We briefly reviewed her complicated medical history. No ENT surgery. Family history-allergies, asthma, heart disease, clotting disorders. She lives alone, retired from office work. Does not recognize significant exposures in her home.  Prior to Admission medications   Medication Sig Start Date End Date Taking? Authorizing Cherina Dhillon  acetaminophen (TYLENOL) 325 MG tablet Take 2 tablets (650 mg total) by mouth every 6 (six) hours as needed. 04/24/13   Arlee Muslim, PA-C  cetirizine (ZYRTEC) 10 MG tablet Take 10 mg by mouth daily.     Historical Nashae Maudlin, MD  cholecalciferol (VITAMIN D) 1000 UNITS tablet Take 1,000 Units by mouth daily.    Historical Geetika Laborde, MD  clorazepate (TRANXENE) 7.5 MG tablet Take 1 tablet (7.5 mg total) by mouth 3 (three) times daily. As needed for nerves 02/09/14   Noralee Space, MD  Diphenhyd-Hydrocort-Nystatin (FIRST-DUKES MOUTHWASH) SUSP 1 tsp gargle and swallow four times daily as needed 05/12/14   Noralee Space, MD  FLOVENT [facility] 44 MCG/ACT inhaler Inhale 2 puffs two times  daily 01/12/14   Noralee Space, MD  folic acid (FOLVITE) 629 MCG tablet Take 400 mcg by mouth daily.    Historical Major Santerre, MD  furosemide  (LASIX) 20 MG tablet Take 2 tablets by mouth  every morning 10/20/13   Noralee Space, MD  ipratropium-albuterol (DUONEB) 0.5-2.5 (3) MG/3ML SOLN Take 3 mLs by nebulization 2 (two) times daily. 05/19/13   Noralee Space, MD  metoprolol succinate (TOPROL-XL) 25 MG 24 hr tablet Take 1 tablet (25 mg total) by mouth daily before breakfast. 02/09/14   Noralee Space, MD  Multiple Vitamin (MULTIVITAMIN) tablet Take 1 tablet by mouth daily.    Historical Fatiha Guzy, MD  Multiple Vitamins-Minerals (PROTEGRA CARDIO PO) Take 1 tablet by mouth daily.    Historical Sela Falk, MD  NASONEX 50 MCG/ACT nasal spray Place 2 sprays into the  nose 2 (two) times daily. 01/17/14   Noralee Space, MD  pantoprazole (PROTONIX) 40 MG tablet Take 1 tablet (40 mg total) by mouth 2 (two) times daily. 09/22/13   Noralee Space, MD  Polyethyl Glycol-Propyl Glycol (SYSTANE) 0.4-0.3 % SOLN Place 2 drops into both eyes 4 (four) times daily.     Historical Tayloranne Lekas, MD  pravastatin (PRAVACHOL) 40 MG tablet Take 40 mg by mouth at bedtime.    Historical Payson Evrard, MD  Rivaroxaban (XARELTO) 20 MG TABS tablet Take 1 tablet by mouth  daily 02/09/14   Volanda Napoleon, MD  sennosides-docusate sodium (SENOKOT-S) 8.6-50 MG tablet Take 2 tablets by mouth at bedtime.     Historical Casee Knepp, MD   Past Medical History  Diagnosis Date  . Allergic rhinitis   . Sarcoidosis   . Pulmonary hypertension   . Asthmatic bronchitis   . Pulmonary embolism   .  Hypertension   . CAD (coronary artery disease)   . Cardiac pacemaker in situ   . Venous insufficiency   . Hyperlipidemia   . Obesity   . PUD (peptic ulcer disease)   . Hemorrhoids   . Renal insufficiency   . UTI (lower urinary tract infection)   . DJD (degenerative joint disease)   . Cervicalgia   . Lumbar back pain   . Anxiety   . Myocardial infarction 2012  . CHF (congestive heart failure) 2012  . COPD (chronic obstructive pulmonary disease)   . Shortness of breath     OCCASIONAL  . GERD  (gastroesophageal reflux disease)   . Cancer     VULVAR CANCER  . Pacemaker   . Stroke 2006    balance problems remain  . OSA (obstructive sleep apnea)     USES O2 AT HOME WITH NASAL MASK  . Gallstones   . ALLERGIC RHINITIS 12/10/2007    Qualifier: Diagnosis of  By: Lenna Gilford MD, [place] 09/30/2007    Qualifier: Diagnosis of  By: Rosana Hoes CMA, Tammy    . ASTHMATIC BRONCHITIS, ACUTE 09/30/2007    Qualifier: History of  By: Rosana Hoes CMA, Tammy    . BACK PAIN, LUMBAR 09/30/2007    Qualifier: Diagnosis of  By: Rosana Hoes CMA, Tammy    . CIN III (cervical intraepithelial neoplasia III) 05/28/2012  . CORONARY ARTERY DISEASE 09/05/2008    Qualifier: Diagnosis of  By: Lenna Gilford MD, [place], LEG, RIGHT 08/29/2010    Qualifier: Diagnosis of  By: Royal Piedra NP, Tammy    . DEGENERATIVE JOINT DISEASE 01/05/2009    Qualifier: Diagnosis of  By: Lenna Gilford MD, [place] 09/30/2007    Qualifier: Diagnosis of  By: Rosana Hoes CMA, [place]    . HYPERLIPIDEMIA 09/05/2008    Qualifier: Diagnosis of  By: Lenna Gilford MD, Deborra Medina   . HYPERTENSION 12/10/2007    Qualifier: Diagnosis of  By: Lenna Gilford MD, Deborra Medina   . OA (osteoarthritis) of knee 04/21/2013  . OBESITY 12/10/2007    Qualifier: Diagnosis of  By: Lenna Gilford MD, [place] 07/11/2009    Qualifier: Diagnosis of  By: Lenna Gilford MD, Deborra Medina   . Postop Hypokalemia 04/22/2013  . Postoperative anemia due to acute blood loss 04/22/2013  . PUD 12/10/2007    Qualifier: History of  By: Lenna Gilford MD, Deborra Medina   . PULMONARY EMBOLISM 07/22/2010    Qualifier: Diagnosis of  By: Lenna Gilford MD, Deborra Medina   . PULMONARY HYPERTENSION 09/30/2007    Qualifier: Diagnosis of  By: Rosana Hoes CMA, Tammy    . RENAL INSUFFICIENCY 08/17/2010    Qualifier: Diagnosis of  By: Lenna Gilford MD, [place] 09/30/2007    Qualifier: History of  By: Rosana Hoes CMA, Tammy    . SYNCOPE 12/11/2007    Qualifier: History of  By: Lenna Gilford MD, Deborra Medina   . UTI 08/17/2010    Qualifier:  Diagnosis of  By: Lenna Gilford MD, Deborra Medina   . VENOUS INSUFFICIENCY 12/10/2007    Qualifier: Diagnosis of  By: Lenna Gilford MD, Deborra Medina   . Impaired glucose tolerance 04/29/2014   Past Surgical History  Procedure Laterality Date  . Appendectomy    . Cholecystectomy  01/1996    Dr. Janan Halter  . Abdominal hysterectomy    . Lumbar disc surgery  1990    X2  . Left knee arthroscopy    .  Ivc filter placed  07/2010  . Pacemaker insertion  2008  . Vulvar lesion removal  07/16/2012    Procedure: VULVAR LESION;  Surgeon: Imagene Gurney A. Alycia Rossetti, MD;  Location: [facility];  Service: Gynecology;  Laterality: Right;  Wide Local Excision of the Vulvar  . Total knee arthroplasty Left 04/21/2013    Procedure: LEFT TOTAL KNEE ARTHROPLASTY;  Surgeon: Gearlean Alf, MD;  Location: [facility];  Service: Orthopedics;  Laterality: Left;   Family History  Problem Relation Age of Onset  . Heart attack Father   . Breast cancer Sister     mets  . Heart attack Brother   . Kidney failure Sister    History   Social History  . Marital Status: Widowed    Spouse Name: N/A    Number of Children: 4  . Years of Education: N/A   Occupational History  . retired    Social History Main Topics  . Smoking status: Never Smoker   . Smokeless tobacco: Never Used  . Alcohol Use: No  . Drug Use: No  . Sexual Activity: Not on file   Other Topics Concern  . Not on file   Social History Narrative  . No narrative on file   ROS-see HPI Constitutional:   No-   weight loss, night sweats, fevers, chills, fatigue, lassitude. HEENT:   No-  headaches, difficulty swallowing, tooth/dental problems, sore throat,       No-  sneezing, itching, ear ache, +nasal congestion, +post nasal drip,  CV:  No-   chest pain, orthopnea, PND, swelling in lower extremities, anasarca,                                  dizziness, palpitations Resp: + shortness of breath with exertion or at rest.              No-   productive cough,  + non-productive cough,  No-  coughing up of blood.              No-   change in color of mucus.  + wheezing.   Skin: No-   rash or lesions. GI:  No-   heartburn, indigestion, abdominal pain, nausea, vomiting, diarrhea,                 change in bowel habits, loss of appetite GU: No-   dysuria, change in color of urine, no urgency or frequency.  No- flank pain. MS:  No-   joint pain or swelling.  No- decreased range of motion.  No- back pain. Neuro-     nothing unusual Psych:  No- change in mood or affect. No depression or anxiety.  No memory loss.  OBJ- Physical Exam General- Alert, Oriented, Affect-appropriate, Distress- none acute Skin- rash-none, lesions- none, excoriation- none Lymphadenopathy- none Head- atraumatic            Eyes- Gross vision intact, PERRLA, conjunctivae and secretions clear            Ears- Hearing, canals-normal            Nose- Clear, +Septal dev/ Narrow on the left, no-mucus, polyps, erosion, perforation             Throat- Mallampati II , mucosa clear , drainage- none, tonsils- atrophic Neck- flexible , trachea midline, no stridor , thyroid nl, carotid no bruit Chest - symmetrical excursion , unlabored  Heart/CV- RRR , no murmur , no gallop  , no rub, nl s1 s2                           - JVD- none , edema- none, stasis changes- none, varices- none           Lung- clear to P&A, wheeze- none, cough- none , dullness-none, rub- none           Chest wall- +L pacer Abd- tender-no, distended-no, bowel sounds-present, HSM- no Br/ Gen/ Rectal- Not done, not indicated Extrem- cyanosis- none, clubbing, none, atrophy- none, strength- nl, + support stockings Neuro- grossly intact to observation

## 2014-06-09 NOTE — Patient Instructions (Signed)
Sample Dymista nasal spray    Try 1-2 puffs each nostril once daily at bedtime    Try this instead of Nasonex or Zyrtec  Order lab- Allergy profile   Dx seasonal and perennial allergic rhinitis

## 2014-06-09 NOTE — Assessment & Plan Note (Signed)
Responded to allergy vaccine after positive skin testing in the past, reinforcing impression there is an atopic component now. Plan-lab allergy profile, sample Dymista, okay to try off Zyrtec occasionally

## 2014-06-10 LAB — ALLERGY FULL PROFILE
Allergen,Goose feathers, e70: <0.1 kU/L
Aspergillus fumigatus, m3: <0.1 kU/L
Bahia Grass: <0.1 kU/L
Bermuda Grass: <0.1 kU/L
Box Elder IgE: <0.1 kU/L
Candida Albicans: <0.1 kU/L
Common Ragweed: <0.1 kU/L
Curvularia lunata: <0.1 kU/L
Dog Dander: <0.1 kU/L
Elm IgE: <0.1 kU/L
Fescue: <0.1 kU/L
G005 Rye, Perennial: <0.1 kU/L
G009 Red Top: <0.1 kU/L
Goldenrod: <0.1 kU/L
Helminthosporium halodes: <0.1 kU/L
IgE (Immunoglobulin E), Serum: 3.4 IU/mL (ref 0.0–180.0)
Plantain: <0.1 kU/L
Stemphylium Botryosum: <0.1 kU/L
Timothy Grass: <0.1 kU/L

## 2014-06-16 ENCOUNTER — Other Ambulatory Visit: Payer: Self-pay

## 2014-06-16 ENCOUNTER — Telehealth: Payer: Self-pay

## 2014-06-16 MED ORDER — METOPROLOL SUCCINATE ER 25 MG PO TB24: 25.0000 mg | ORAL_TABLET | Freq: Every day | ORAL | Status: DC

## 2014-06-17 NOTE — Telephone Encounter (Signed)
refill 

## 2014-07-06 ENCOUNTER — Ambulatory Visit (INDEPENDENT_AMBULATORY_CARE_PROVIDER_SITE_OTHER): Payer: Medicare Other | Admitting: *Deleted

## 2014-07-06 DIAGNOSIS — I442 Atrioventricular block, complete: Secondary | ICD-10-CM

## 2014-07-06 NOTE — Progress Notes (Signed)
Remote pacemaker transmission.   

## 2014-07-07 ENCOUNTER — Other Ambulatory Visit: Payer: Self-pay

## 2014-07-07 MED ORDER — FUROSEMIDE 20 MG PO TABS: 20.0000 mg | ORAL_TABLET | Freq: Two times a day (BID) | ORAL | Status: DC

## 2014-07-07 MED ORDER — METOPROLOL SUCCINATE ER 25 MG PO TB24: 25.0000 mg | ORAL_TABLET | Freq: Every day | ORAL | Status: DC

## 2014-07-07 MED ORDER — PANTOPRAZOLE SODIUM 40 MG PO TBEC: 40.0000 mg | DELAYED_RELEASE_TABLET | Freq: Two times a day (BID) | ORAL | Status: DC

## 2014-07-10 ENCOUNTER — Other Ambulatory Visit: Payer: Self-pay

## 2014-07-10 MED ORDER — PANTOPRAZOLE SODIUM 40 MG PO TBEC: 40.0000 mg | DELAYED_RELEASE_TABLET | Freq: Two times a day (BID) | ORAL | Status: DC

## 2014-07-10 MED ORDER — METOPROLOL SUCCINATE ER 25 MG PO TB24: 25.0000 mg | ORAL_TABLET | Freq: Every day | ORAL | Status: DC

## 2014-07-10 MED ORDER — FUROSEMIDE 20 MG PO TABS: 20.0000 mg | ORAL_TABLET | Freq: Two times a day (BID) | ORAL | Status: DC

## 2014-07-10 NOTE — Telephone Encounter (Signed)
Furosemide tab Metoprolol Succ tab  Pantoprazole Sod  rx filled

## 2014-07-13 LAB — MDC_IDC_ENUM_SESS_TYPE_REMOTE
Battery Voltage: 2.75 V
Brady Statistic AP VS Percent: 0 %
Brady Statistic AS VS Percent: 0 %
Date Time Interrogation Session: 20150720103514
Lead Channel Pacing Threshold Amplitude: 0.5 V
Lead Channel Pacing Threshold Amplitude: 1.125 V
Lead Channel Pacing Threshold Pulse Width: 0.4 ms
Lead Channel Pacing Threshold Pulse Width: 0.4 ms
Lead Channel Setting Pacing Amplitude: 2 V
Lead Channel Setting Pacing Pulse Width: 0.4 ms
MDC IDC MSMT BATTERY IMPEDANCE: 1848 Ohm
MDC IDC MSMT BATTERY REMAINING LONGEVITY: 31 mo
MDC IDC MSMT LEADCHNL RA IMPEDANCE VALUE: 388 Ohm
MDC IDC MSMT LEADCHNL RA SENSING INTR AMPL: 2.8 mV
MDC IDC MSMT LEADCHNL RV IMPEDANCE VALUE: 526 Ohm
MDC IDC SET LEADCHNL RV PACING AMPLITUDE: 2.5 V
MDC IDC SET LEADCHNL RV SENSING SENSITIVITY: 4 mV
MDC IDC STAT BRADY AP VP PERCENT: 15 %
MDC IDC STAT BRADY AS VP PERCENT: 85 %

## 2014-07-24 ENCOUNTER — Encounter: Payer: Self-pay | Admitting: Cardiology

## 2014-07-29 ENCOUNTER — Ambulatory Visit (INDEPENDENT_AMBULATORY_CARE_PROVIDER_SITE_OTHER): Payer: Medicare Other | Admitting: Internal Medicine

## 2014-07-29 ENCOUNTER — Encounter: Payer: Self-pay | Admitting: Internal Medicine

## 2014-07-29 ENCOUNTER — Telehealth: Payer: Self-pay | Admitting: Internal Medicine

## 2014-07-29 VITALS — BP 128/78 | HR 84 | Ht 64.5 in | Wt 182.8 lb

## 2014-07-29 DIAGNOSIS — J45909 Unspecified asthma, uncomplicated: Secondary | ICD-10-CM

## 2014-07-29 DIAGNOSIS — D869 Sarcoidosis, unspecified: Secondary | ICD-10-CM

## 2014-07-29 DIAGNOSIS — J342 Deviated nasal septum: Secondary | ICD-10-CM | POA: Insufficient documentation

## 2014-07-29 DIAGNOSIS — J3089 Other allergic rhinitis: Principal | ICD-10-CM

## 2014-07-29 DIAGNOSIS — J309 Allergic rhinitis, unspecified: Secondary | ICD-10-CM

## 2014-07-29 DIAGNOSIS — IMO0001 Reserved for inherently not codable concepts without codable children: Secondary | ICD-10-CM

## 2014-07-29 DIAGNOSIS — J302 Other seasonal allergic rhinitis: Secondary | ICD-10-CM

## 2014-07-29 DIAGNOSIS — J4520 Mild intermittent asthma, uncomplicated: Secondary | ICD-10-CM

## 2014-07-29 MED ORDER — MAGIC MOUTHWASH: 5.0000 mL | Freq: Four times a day (QID) | ORAL | Status: DC | PRN

## 2014-07-29 MED ORDER — AZELASTINE-FLUTICASONE 137-50 MCG/ACT NA SUSP: 2.0000 | Freq: Every day | NASAL | Status: DC

## 2014-07-29 NOTE — Telephone Encounter (Signed)
Per CY-patient requested MMW today at her visit-this is what was sent to her pharmacy on file; please call pharmacy and find out what they gave her last time-what rx she is referring to. Thanks.

## 2014-07-29 NOTE — Assessment & Plan Note (Signed)
Not clinically active

## 2014-07-29 NOTE — Progress Notes (Signed)
06/09/14- 78 yof never smoker referred courtesy of Dr Blair Heys for allergy evaluation.  Hx OSA- Dr Gwenette Greet. Dr Lenna Gilford has seen for pulmonary- hx sarcoid, DVT/PE, CAD/ Pacemaker, asthmatic bronchitis, allergic rhinitis, cancer cervix Patient complains of sinus congestion and morning rhinorrhea x30 years or more. Triggers include spring and fall pollens, weather changes. Blames drainage for hoarseness and sneezing. Wears oxygen 2.5 L at night with humidifier on her oxygen. Skin testing 10 years ago with allergy shots reported helpful x2 years. Now in the past 3-5 years she has had more problems with nasal pressure, congestion, sneezing and drainage. Chest tightness and dyspnea managed with nebulizer twice daily, Flovent 44. She says she has not been told she has "asthma". We briefly reviewed her complicated medical history. No ENT surgery. Family history-allergies, asthma, heart disease, clotting disorders. She lives alone, retired from office work. Does not recognize significant exposures in her home.  07/29/14- 63 yoF never smoker followed for allergic rhinitis, hx sarcoid, DVT/PE, CAD/ Pacemaker, Hx OSA- Dr Gwenette Greet.  hx sarcoid, DVT/PE, CAD/ Pacemaker, Wears oxygen 2.5 L at night with humidifier on her oxygen/ nasal mask Weather change blamed for hacking cough. Nasal O2  2.5/ L/ [employer]/ sleep-at night dries nose, made worse by Dymista. Liked how Dymista stopped post nasal drip and reduced cough. Magic mouthwash helps throat irritation and thus helps cough.  Allergy profile 06/09/14- Total IgE 3.4- negative for specific or total IgE elevations against common environmental alleregens.  ROS-see HPI Constitutional:   No-   weight loss, night sweats, fevers, chills, fatigue, lassitude. HEENT:   No-  headaches, difficulty swallowing, tooth/dental problems, sore throat,       No-  sneezing, itching, ear ache, +nasal congestion, +post nasal drip,  CV:  No-   chest pain, orthopnea, PND, swelling in lower  extremities, anasarca,                                  dizziness, palpitations Resp: + shortness of breath with exertion or at rest.              No-   productive cough,  + non-productive cough,  No- coughing up of blood.              No-   change in color of mucus.  + wheezing.   Skin: No-   rash or lesions. GI:  No-   heartburn, indigestion, abdominal pain, nausea, vomiting,  GU:  MS:  No-   joint pain or swelling.  . Neuro-     nothing unusual Psych:  No- change in mood or affect. No depression or anxiety.  No memory loss.  OBJ- Physical Exam General- Alert, Oriented, Affect-appropriate, Distress- none acute Skin- rash-none, lesions- none, excoriation- none Lymphadenopathy- none Head- atraumatic            Eyes- Gross vision intact, PERRLA, conjunctivae and secretions clear            Ears- Hearing, canals-normal            Nose- Clear, +Septal dev/ Narrow on the left, no-mucus, polyps, erosion, perforation             Throat- Mallampati II , mucosa clear , drainage- none, tonsils- atrophic Neck- flexible , trachea midline, no stridor , thyroid nl, carotid no bruit Chest - symmetrical excursion , unlabored           Heart/CV- RRR , no  murmur , no gallop  , no rub, nl s1 s2                           - JVD- none , edema- none, stasis changes- none, varices- none           Lung- clear to P&A, wheeze- none, cough- none , dullness-none, rub- none           Chest wall- +L pacer Abd-  Br/ Gen/ Rectal- Not done, not indicated Extrem- cyanosis- none, clubbing, none, atrophy- none, strength- nl, + support stockings Neuro- grossly intact to observation

## 2014-07-29 NOTE — Assessment & Plan Note (Signed)
Narrow and partly occlusive on Left Not ready for ENT intervention. Consider nasal strips

## 2014-07-29 NOTE — Telephone Encounter (Signed)
Called and spoke with pt and she stated that the medication that was called in for her is not the right medication.  She stated that the medication that she used to get was 1 tsp gargle and swallow when she needs it for her throat.  She stated that the medication that was sent in today was also going to cost her $41 and she is requesting that something else that is covered by her insurance be sent in to the pharmacy.  CY please advise. Thanks  Last seen today Next ov--09/2014  Allergies  Allergen Reactions  . Dymista [Azelastine-Fluticasone] Other (See Comments)    Headaches  . Imitrex [Sumatriptan] Other (See Comments)    Reaction unknown  . Sulfa Antibiotics Other (See Comments)    Doesn't like to drink lots of water.  . Sulfonamide Derivatives     REACTION: nausea    Current Outpatient Prescriptions on File Prior to Visit  Medication Sig Dispense Refill  . acetaminophen (TYLENOL) 325 MG tablet Take 2 tablets (650 mg total) by mouth every 6 (six) hours as needed.  60 tablet  0  . Alum & Mag Hydroxide-Simeth (MAGIC MOUTHWASH) SOLN Take 5 mLs by mouth 4 (four) times daily as needed for mouth pain.  10 mL  0  . Azelastine-Fluticasone 137-50 MCG/ACT SUSP Place 2 sprays into both nostrils every morning.      . cetirizine (ZYRTEC) 10 MG tablet Take 10 mg by mouth daily.       . cholecalciferol (VITAMIN D) 1000 UNITS tablet Take 1,000 Units by mouth daily.      . clorazepate (TRANXENE) 7.5 MG tablet Take 1 tablet (7.5 mg total) by mouth 3 (three) times daily. As needed for nerves  270 tablet  1  . FLOVENT HFA 44 MCG/ACT inhaler Inhale 2 puffs two times  daily  34.7 g  6  . folic acid (FOLVITE) 425 MCG tablet Take 400 mcg by mouth daily.      . furosemide (LASIX) 20 MG tablet Take 1 tablet (20 mg total) by mouth 2 (two) times daily.  180 tablet  3  . ipratropium-albuterol (DUONEB) 0.5-2.5 (3) MG/3ML SOLN Take 3 mLs by nebulization 2 (two) times daily.  180 mL  5  . metoprolol succinate  (TOPROL-XL) 25 MG 24 hr tablet Take 1 tablet (25 mg total) by mouth daily before breakfast.  90 tablet  3  . Multiple Vitamin (MULTIVITAMIN) tablet Take 1 tablet by mouth daily.      . Multiple Vitamins-Minerals (PROTEGRA CARDIO PO) Take 1 tablet by mouth daily.      [name] Kitchen NASONEX 50 MCG/ACT nasal spray Place 2 sprays into the  nose 2 (two) times daily.  102 g  1  . pantoprazole (PROTONIX) 40 MG tablet Take 1 tablet (40 mg total) by mouth 2 (two) times daily.  180 tablet  3  . Polyethyl Glycol-Propyl Glycol (SYSTANE) 0.4-0.3 % SOLN Place 2 drops into both eyes 4 (four) times daily.       . pravastatin (PRAVACHOL) 40 MG tablet Take 40 mg by mouth at bedtime.      . Rivaroxaban (XARELTO) 20 MG TABS tablet Take 1 tablet by mouth  daily  90 tablet  3  . sennosides-docusate sodium (SENOKOT-S) 8.6-50 MG tablet Take 2 tablets by mouth at bedtime.        No current facility-administered medications on file prior to visit.

## 2014-07-29 NOTE — Patient Instructions (Signed)
Sample Dymista nasal spray     Try again. This time take 1-2 puffs each nostril once daily In The Morning. See if this is comfortable, without the headaches.  Please call if needed

## 2014-07-29 NOTE — Assessment & Plan Note (Signed)
Sensitive to weather changes and also thinks postnasal drip contributes to cough. Negative allergy profile makes this probably a nonallergic rhinitis/ bronchitis

## 2014-07-29 NOTE — Telephone Encounter (Signed)
Called and spoke with [facility] and they stated that they have filled the tussionex for the pt back in 2013 but their system only will go back for 2 years and she has not filled the MMW from them in the last 2 years.    i called pt and she is aware and stated that she cannot think of the medication that she wants filled and if she does think of it she will call back.  She will not fill the MMW at this time.

## 2014-07-29 NOTE — Assessment & Plan Note (Signed)
Components of rhinitis with drainage, and septal deviation Plan- try again with Dymista

## 2014-08-06 ENCOUNTER — Telehealth: Payer: Self-pay | Admitting: Internal Medicine

## 2014-08-06 MED ORDER — AZELASTINE-FLUTICASONE 137-50 MCG/ACT NA SUSP: NASAL | Status: DC

## 2014-08-06 NOTE — Telephone Encounter (Signed)
Pt states that Dymista is helping and only causing slight headaches but pt states she can handle this.  She has not taken Zyrtec this whole week while using Dymista.  She wants to know if she can have rx for Dymista sent to [facility] and also does she need to continue taking Zyrtec.  Please advise.

## 2014-08-06 NOTE — Telephone Encounter (Signed)
Spoke with pt and advised of Dr Janee Morn recommendations.  Rx sent to [facility].

## 2014-08-06 NOTE — Telephone Encounter (Signed)
Ok Rx Dymista nasal spray, # 3, 1-2 puffs each nostril once daily at bedtime. Ref x 3 Can start and stop Zyrtec as needed

## 2014-08-18 ENCOUNTER — Encounter: Payer: Self-pay | Admitting: Internal Medicine

## 2014-08-19 ENCOUNTER — Encounter: Payer: Self-pay | Admitting: Internal Medicine

## 2014-09-01 ENCOUNTER — Encounter: Payer: Self-pay | Admitting: Internal Medicine

## 2014-09-01 ENCOUNTER — Ambulatory Visit (INDEPENDENT_AMBULATORY_CARE_PROVIDER_SITE_OTHER): Payer: Medicare Other | Admitting: Internal Medicine

## 2014-09-01 VITALS — BP 136/84 | HR 88 | Ht 64.5 in | Wt 181.4 lb

## 2014-09-01 DIAGNOSIS — I5042 Chronic combined systolic (congestive) and diastolic (congestive) heart failure: Secondary | ICD-10-CM

## 2014-09-01 DIAGNOSIS — I509 Heart failure, unspecified: Secondary | ICD-10-CM

## 2014-09-01 DIAGNOSIS — Z950 Presence of cardiac pacemaker: Secondary | ICD-10-CM

## 2014-09-01 DIAGNOSIS — R55 Syncope and collapse: Secondary | ICD-10-CM

## 2014-09-01 LAB — MDC_IDC_ENUM_SESS_TYPE_INCLINIC
Battery Remaining Longevity: 30 mo
Battery Voltage: 2.75 V
Brady Statistic AP VS Percent: 0 %
Date Time Interrogation Session: 20150915115923
Lead Channel Impedance Value: 390 Ohm
Lead Channel Pacing Threshold Amplitude: 0.75 V
Lead Channel Pacing Threshold Pulse Width: 0.27 ms
Lead Channel Pacing Threshold Pulse Width: 0.4 ms
Lead Channel Pacing Threshold Pulse Width: 0.4 ms
Lead Channel Setting Pacing Amplitude: 2 V
Lead Channel Setting Pacing Amplitude: 2.5 V
Lead Channel Setting Pacing Pulse Width: 0.4 ms
Lead Channel Setting Sensing Sensitivity: 4 mV
MDC IDC MSMT BATTERY IMPEDANCE: 1911 Ohm
MDC IDC MSMT LEADCHNL RA PACING THRESHOLD AMPLITUDE: 0.25 V
MDC IDC MSMT LEADCHNL RA PACING THRESHOLD AMPLITUDE: 0.5 V
MDC IDC MSMT LEADCHNL RA SENSING INTR AMPL: 2.8 mV
MDC IDC MSMT LEADCHNL RV IMPEDANCE VALUE: 514 Ohm
MDC IDC STAT BRADY AP VP PERCENT: 17 %
MDC IDC STAT BRADY AS VP PERCENT: 83 %
MDC IDC STAT BRADY AS VS PERCENT: 0 %

## 2014-09-01 NOTE — Assessment & Plan Note (Signed)
Her EF is 45% and her symptoms are class 2. I have recommended watchful waiting but she may ultimately require upgrade to a BiV PPM. I am not completely convinced that her symptoms of weakness and sob can be improved with BiV PPM upgrade.

## 2014-09-01 NOTE — Patient Instructions (Signed)
Your physician wants you to follow-up in: 12 months with Dr Knox Saliva will receive a reminder letter in the mail two months in advance. If you don't receive a letter, please call our office to schedule the follow-up appointment.   Remote monitoring is used to monitor your Pacemaker or ICD from home. This monitoring reduces the number of office visits required to check your device to one time per year. It allows [place] to keep an eye on the functioning of your device to ensure it is working properly. You are scheduled for a device check from home on 12/01/14. You may send your transmission at any time that day. If you have a wireless device, the transmission will be sent automatically. After your physician reviews your transmission, you will receive a postcard with your next transmission date.

## 2014-09-01 NOTE — Progress Notes (Signed)
HPI [name] [name] returns today for followup. She is a very pleasant 78 year old woman with a history of intermittent complete heart block, status post permanent pacemaker insertion, hypertension, dyslipidemia, and stroke. She has paroxysmal atrial fibrillation. The patient has done well in the interim, and is tolerating her anticoagulation very nicely. Previously, she took Lovenox injections, but is now on Xarelto. She denies chest pain, shortness of breath, or syncope. She does have generalized fatigue.  Allergies  Allergen Reactions  . Dymista [Azelastine-Fluticasone] Other (See Comments)    Headaches  . Sulfonamide Derivatives Nausea Only  . Imitrex [Sumatriptan] Other (See Comments)    Reaction unknown  . Sulfa Antibiotics Other (See Comments)    Doesn't like to drink lots of water.     Current Outpatient Prescriptions  Medication Sig Dispense Refill  . acetaminophen (TYLENOL) 325 MG tablet Take 2 tablets (650 mg total) by mouth every 6 (six) hours as needed.  60 tablet  0  . Azelastine-Fluticasone (DYMISTA) 137-50 MCG/ACT SUSP Place 2 sprays into the nose every morning.       . cetirizine (ZYRTEC) 10 MG tablet Take 10 mg by mouth daily.       . cholecalciferol (VITAMIN D) 1000 UNITS tablet Take 1,000 Units by mouth daily.      . clorazepate (TRANXENE) 7.5 MG tablet Take 7.5 mg by mouth 3 (three) times daily.      [name] Kitchen FLOVENT HFA 44 MCG/ACT inhaler Inhale 2 puffs two times  daily  13.0 g  6  . folic acid (FOLVITE) 865 MCG tablet Take 400 mcg by mouth daily.      . furosemide (LASIX) 20 MG tablet Take 1 tablet (20 mg total) by mouth 2 (two) times daily.  180 tablet  3  . ipratropium-albuterol (DUONEB) 0.5-2.5 (3) MG/3ML SOLN Take 3 mLs by nebulization 2 (two) times daily.  180 mL  5  . metoprolol succinate (TOPROL-XL) 25 MG 24 hr tablet Take 1 tablet (25 mg total) by mouth daily before breakfast.  90 tablet  3  . Multiple Vitamin (MULTIVITAMIN) tablet Take 1 tablet by mouth daily.      .  Multiple Vitamins-Minerals (PROTEGRA CARDIO PO) Take 1 tablet by mouth daily.      . pantoprazole (PROTONIX) 40 MG tablet Take 1 tablet (40 mg total) by mouth 2 (two) times daily.  180 tablet  3  . Polyethyl Glycol-Propyl Glycol (SYSTANE) 0.4-0.3 % SOLN Place 2 drops into both eyes 4 (four) times daily.       . pravastatin (PRAVACHOL) 40 MG tablet Take 40 mg by mouth at bedtime.      . Rivaroxaban (XARELTO) 20 MG TABS tablet Take 1 tablet by mouth  daily  90 tablet  3  . sennosides-docusate sodium (SENOKOT-S) 8.6-50 MG tablet Take 2 tablets by mouth at bedtime.        No current facility-administered medications for this visit.     Past Medical History  Diagnosis Date  . Allergic rhinitis   . Sarcoidosis   . Pulmonary hypertension   . Asthmatic bronchitis   . Pulmonary embolism   . Hypertension   . CAD (coronary artery disease)   . Cardiac pacemaker in situ   . Venous insufficiency   . Hyperlipidemia   . Obesity   . PUD (peptic ulcer disease)   . Hemorrhoids   . Renal insufficiency   . UTI (lower urinary tract infection)   . DJD (degenerative joint disease)   . Cervicalgia   .  Lumbar back pain   . Anxiety   . Myocardial infarction 2012  . CHF (congestive heart failure) 2012  . COPD (chronic obstructive pulmonary disease)   . Shortness of breath     OCCASIONAL  . GERD (gastroesophageal reflux disease)   . Cancer     VULVAR CANCER  . Pacemaker   . Stroke 2006    balance problems remain  . OSA (obstructive sleep apnea)     USES O2 AT HOME WITH NASAL MASK  . Gallstones   . ALLERGIC RHINITIS 12/10/2007    Qualifier: Diagnosis of  By: Lenna Gilford MD, [place] 09/30/2007    Qualifier: Diagnosis of  By: Rosana Hoes CMA, Tam[place]    . ASTHMATIC BRONCHITIS, ACUTE 09/30/2007    Qualifier: History of  By: Rosana Hoes CMA, Tammy    . BACK PAIN, LUMBAR 09/30/2007    Qualifier: Diagnosis of  By: Rosana Hoes CMA, Tammy    . CIN III (cervical intraepithelial neoplasia III) 05/28/2012  . CORONARY  ARTERY DISEASE 09/05/2008    Qualifier: Diagnosis of  By: Lenna Gilford MD, [place], LEG, RIGHT 08/29/2010    Qualifier: Diagnosis of  By: Royal Piedra NP, Tammy    . DEGENERATIVE JOINT DISEASE 01/05/2009    Qualifier: Diagnosis of  By: Lenna Gilford MD, [place] 09/30/2007    Qualifier: Diagnosis of  By: Rosana Hoes CMA, [place]    . HYPERLIPIDEMIA 09/05/2008    Qualifier: Diagnosis of  By: Lenna Gilford MD, Deborra Medina   . HYPERTENSION 12/10/2007    Qualifier: Diagnosis of  By: Lenna Gilford MD, Deborra Medina   . OA (osteoarthritis) of knee 04/21/2013  . OBESITY 12/10/2007    Qualifier: Diagnosis of  By: Lenna Gilford MD, [place] 07/11/2009    Qualifier: Diagnosis of  By: Lenna Gilford MD, Deborra Medina   . Postop Hypokalemia 04/22/2013  . Postoperative anemia due to acute blood loss 04/22/2013  . PUD 12/10/2007    Qualifier: History of  By: Lenna Gilford MD, Deborra Medina   . PULMONARY EMBOLISM 07/22/2010    Qualifier: Diagnosis of  By: Lenna Gilford MD, Deborra Medina   . PULMONARY HYPERTENSION 09/30/2007    Qualifier: Diagnosis of  By: Rosana Hoes CMA, [place]    . RENAL INSUFFICIENCY 08/17/2010    Qualifier: Diagnosis of  By: Lenna Gilford MD, [place] 09/30/2007    Qualifier: History of  By: Rosana Hoes CMA, Tam[place]    . SYNCOPE 12/11/2007    Qualifier: History of  By: Lenna Gilford MD, Deborra Medina   . UTI 08/17/2010    Qualifier: Diagnosis of  By: Lenna Gilford MD, Deborra Medina   . VENOUS INSUFFICIENCY 12/10/2007    Qualifier: Diagnosis of  By: Lenna Gilford MD, Deborra Medina   . Impaired glucose tolerance 04/29/2014    ROS:   All systems reviewed and negative except as noted in the HPI.   Past Surgical History  Procedure Laterality Date  . Appendectomy    . Cholecystectomy  01/1996    Dr. Janan Halter  . Abdominal hysterectomy    . Lumbar disc surgery  1990    X2  . Left knee arthroscopy    . Ivc filter placed  07/2010  . Pacemaker insertion  2008  . Vulvar lesion removal  07/16/2012    Procedure: VULVAR LESION;  Surgeon: Imagene Gurney A. Alycia Rossetti, MD;  Location:  [facility];  Service: Gynecology;  Laterality: Right;  Wide Local Excision of the  Vulvar  . Total knee arthroplasty Left 04/21/2013    Procedure: LEFT TOTAL KNEE ARTHROPLASTY;  Surgeon: Gearlean Alf, MD;  Location: [facility];  Service: Orthopedics;  Laterality: Left;     Family History  Problem Relation Age of Onset  . Heart attack Father   . Breast cancer Sister     mets  . Heart attack Brother   . Kidney failure Sister      History   Social History  . Marital Status: Widowed    Spouse Name: N/A    Number of Children: 4  . Years of Education: N/A   Occupational History  . retired    Social History Main Topics  . Smoking status: Never Smoker   . Smokeless tobacco: Never Used  . Alcohol Use: No  . Drug Use: No  . Sexual Activity: Not on file   Other Topics Concern  . Not on file   Social History Narrative  . No narrative on file     BP 136/84  Pulse 88  Ht 5' 4.5" (1.638 m)  Wt 181 lb 6.4 oz (82.283 kg)  BMI 30.67 kg/m2  Physical Exam:  Well appearing 78 year old woman,NAD HEENT: Unremarkable Neck:  No JVD, no thyromegally Back:  No CVA tenderness Lungs:  Clear with no wheezes, rales, or rhonchi. HEART:  Regular rate rhythm, no murmurs, no rubs, no clicks Abd:  soft, positive bowel sounds, no organomegally, no rebound, no guarding Ext:  2 plus pulses, no edema, no cyanosis, no clubbing Skin:  No rashes no nodules Neuro:  CN II through XII intact, motor grossly intact  DEVICE  Normal device function.  See PaceArt for details.   Assess/Plan:

## 2014-09-01 NOTE — Assessment & Plan Note (Signed)
Her medtronic PPM is working normally. Will recheck in several months. 

## 2014-09-08 ENCOUNTER — Telehealth: Payer: Self-pay

## 2014-09-16 NOTE — Telephone Encounter (Signed)
error 

## 2014-09-22 ENCOUNTER — Telehealth: Payer: Self-pay | Admitting: Internal Medicine

## 2014-09-22 NOTE — Telephone Encounter (Signed)
Attempted to call twice. Phone kept ringing. No voicemail picked up.

## 2014-09-22 NOTE — Telephone Encounter (Signed)
Follow up:   Pt would like a call back on her procedure she would like to do it before or after 11/18 please.

## 2014-09-23 NOTE — Telephone Encounter (Signed)
Chronic combined systolic and diastolic CHF (congestive heart failure) - Evans Lance, MD at 09/01/2014 11:50 AM     Status: Written Related Problem: Chronic combined systolic and diastolic CHF (congestive heart failure)    Her EF is 45% and her symptoms are class 2. I have recommended watchful waiting but she may ultimately require upgrade to a BiV PPM. I am not completely convinced that her symptoms of weakness and sob can be improved with BiV PPM upgrade.    Spoke with patient and let her know he will decide when she has her follow up

## 2014-09-29 LAB — HM MAMMOGRAPHY

## 2014-09-30 ENCOUNTER — Encounter: Payer: Self-pay | Admitting: Internal Medicine

## 2014-09-30 ENCOUNTER — Ambulatory Visit: Payer: Medicare Other | Admitting: Internal Medicine

## 2014-09-30 VITALS — BP 130/90 | HR 106 | Ht 64.5 in | Wt 184.4 lb

## 2014-09-30 DIAGNOSIS — G4733 Obstructive sleep apnea (adult) (pediatric): Secondary | ICD-10-CM

## 2014-09-30 NOTE — Progress Notes (Signed)
06/09/14- 4 yof never smoker referred courtesy of Dr Blair Heys for allergy evaluation.  Hx OSA- Dr Gwenette Greet. Dr Lenna Gilford has seen for pulmonary- hx sarcoid, DVT/PE, CAD/ Pacemaker, asthmatic bronchitis, allergic rhinitis, cancer cervix Patient complains of sinus congestion and morning rhinorrhea x30 years or more. Triggers include spring and fall pollens, weather changes. Blames drainage for hoarseness and sneezing. Wears oxygen 2.5 L at night with humidifier on her oxygen. Skin testing 10 years ago with allergy shots reported helpful x2 years. Now in the past 3-5 years she has had more problems with nasal pressure, congestion, sneezing and drainage. Chest tightness and dyspnea managed with nebulizer twice daily, Flovent 44. She says she has not been told she has "asthma". We briefly reviewed her complicated medical history. No ENT surgery. Family history-allergies, asthma, heart disease, clotting disorders. She lives alone, retired from office work. Does not recognize significant exposures in her home.  07/29/14- 46 yoF never smoker followed for allergic rhinitis, hx sarcoid, DVT/PE, CAD/ Pacemaker, Hx OSA- Dr Gwenette Greet.  hx sarcoid, DVT/PE, CAD/ Pacemaker, Wears oxygen 2.5 L at night with humidifier on her oxygen/ nasal mask Weather change blamed for hacking cough. Nasal O2  2.5/ L/ [employer]/ sleep-at night dries nose, made worse by Dymista. Liked how Dymista stopped post nasal drip and reduced cough. Magic mouthwash helps throat irritation and thus helps cough.  Allergy profile 06/09/14- Total IgE 3.4- negative for specific or total IgE elevations against common environmental allergens.  09/30/14-77 yoF never smoker followed for allergic rhinitis, hx sarcoid, DVT/PE, CAD/ Pacemaker, Hx OSA- Dr Gwenette Greet.  hx sarcoid, DVT/PE, CAD/ Pacemaker, Wears oxygen 2.5 L at night with humidifier on her oxygen/ nasal mask FOLLOW FOR: allergies, dry cough, SOB, Back pain today   ROS-see HPI Constitutional:   No-   weight  loss, night sweats, fevers, chills, fatigue, lassitude. HEENT:   No-  headaches, difficulty swallowing, tooth/dental problems, sore throat,       No-  sneezing, itching, ear ache, +nasal congestion, +post nasal drip,  CV:  No-   chest pain, orthopnea, PND, swelling in lower extremities, anasarca,                                  dizziness, palpitations Resp: + shortness of breath with exertion or at rest.              No-   productive cough,  + non-productive cough,  No- coughing up of blood.              No-   change in color of mucus.  + wheezing.   Skin: No-   rash or lesions. GI:  No-   heartburn, indigestion, abdominal pain, nausea, vomiting,  GU:  MS:  No-   joint pain or swelling.  . Neuro-     nothing unusual Psych:  No- change in mood or affect. No depression or anxiety.  No memory loss.  OBJ- Physical Exam General- Alert, Oriented, Affect-appropriate, Distress- none acute Skin- rash-none, lesions- none, excoriation- none Lymphadenopathy- none Head- atraumatic            Eyes- Gross vision intact, PERRLA, conjunctivae and secretions clear            Ears- Hearing, canals-normal            Nose- Clear, +Septal dev/ Narrow on the left, no-mucus, polyps, erosion, perforation  Throat- Mallampati II , mucosa clear , drainage- none, tonsils- atrophic Neck- flexible , trachea midline, no stridor , thyroid nl, carotid no bruit Chest - symmetrical excursion , unlabored           Heart/CV- RRR , no murmur , no gallop  , no rub, nl s1 s2                           - JVD- none , edema- none, stasis changes- none, varices- none           Lung- clear to P&A, wheeze- none, cough- none , dullness-none, rub- none           Chest wall- +L pacer Abd-  Br/ Gen/ Rectal- Not done, not indicated Extrem- cyanosis- none, clubbing, none, atrophy- none, strength- nl, + support stockings Neuro- grossly intact to observation

## 2014-09-30 NOTE — Patient Instructions (Signed)
Ok to try a saline rinse with a Neti pot or squeeze bottle once a day as needed  Consider trying otc nasal saline gel- AYR, NeilMed, or any other brand is fine. This can help with dryness and sneezing  Consider trying otc nasal spray Nasalcrom/ cromol/ cromolyn instead of, or in addition to, Dymista

## 2014-10-20 ENCOUNTER — Telehealth: Payer: Self-pay | Admitting: Internal Medicine

## 2014-10-20 MED ORDER — CLORAZEPATE DIPOTASSIUM 7.5 MG PO TABS: 7.5000 mg | ORAL_TABLET | Freq: Three times a day (TID) | ORAL | Status: DC

## 2014-10-20 NOTE — Telephone Encounter (Signed)
Done hardcopy to robin  

## 2014-10-20 NOTE — Telephone Encounter (Signed)
Patient would like for the nurse to call her concerning her Clorazpate prescription.  She needs it refilled.

## 2014-10-20 NOTE — Telephone Encounter (Signed)
Patient informed and faxed per pt request Clorazepate to [facility].

## 2014-10-30 ENCOUNTER — Encounter: Payer: Self-pay | Admitting: Internal Medicine

## 2014-10-30 ENCOUNTER — Ambulatory Visit (INDEPENDENT_AMBULATORY_CARE_PROVIDER_SITE_OTHER): Payer: Medicare Other | Admitting: Internal Medicine

## 2014-10-30 ENCOUNTER — Other Ambulatory Visit (INDEPENDENT_AMBULATORY_CARE_PROVIDER_SITE_OTHER): Payer: Medicare Other

## 2014-10-30 ENCOUNTER — Other Ambulatory Visit: Payer: Medicare Other

## 2014-10-30 VITALS — BP 148/90 | HR 94 | Temp 98.2°F | Ht 64.5 in | Wt 182.0 lb

## 2014-10-30 DIAGNOSIS — Z0189 Encounter for other specified special examinations: Secondary | ICD-10-CM

## 2014-10-30 DIAGNOSIS — Z0001 Encounter for general adult medical examination with abnormal findings: Secondary | ICD-10-CM | POA: Insufficient documentation

## 2014-10-30 DIAGNOSIS — Z0000 Encounter for general adult medical examination without abnormal findings: Secondary | ICD-10-CM

## 2014-10-30 DIAGNOSIS — I10 Essential (primary) hypertension: Secondary | ICD-10-CM

## 2014-10-30 DIAGNOSIS — E785 Hyperlipidemia, unspecified: Secondary | ICD-10-CM

## 2014-10-30 LAB — CBC WITH DIFFERENTIAL/PLATELET
BASOS PCT: 0.5 % (ref 0.0–3.0)
Basophils Absolute: 0 10*3/uL (ref 0.0–0.1)
EOS ABS: 0.1 10*3/uL (ref 0.0–0.7)
Eosinophils Relative: 2 % (ref 0.0–5.0)
HEMATOCRIT: 40.5 % (ref 36.0–46.0)
HEMOGLOBIN: 13.3 g/dL (ref 12.0–15.0)
LYMPHS ABS: 1.4 10*3/uL (ref 0.7–4.0)
Lymphocytes Relative: 20.6 % (ref 12.0–46.0)
MCHC: 32.9 g/dL (ref 30.0–36.0)
MCV: 90.4 fl (ref 78.0–100.0)
MONO ABS: 0.7 10*3/uL (ref 0.1–1.0)
Monocytes Relative: 10.9 % (ref 3.0–12.0)
NEUTROS ABS: 4.4 10*3/uL (ref 1.4–7.7)
Neutrophils Relative %: 66 % (ref 43.0–77.0)
Platelets: 181 10*3/uL (ref 150.0–400.0)
RBC: 4.48 Mil/uL (ref 3.87–5.11)
RDW: 13.6 % (ref 11.5–15.5)
WBC: 6.6 10*3/uL (ref 4.0–10.5)

## 2014-10-30 LAB — TSH: TSH: 1.68 u[IU]/mL (ref 0.35–4.50)

## 2014-10-30 LAB — LIPID PANEL
CHOL/HDL RATIO: 3
Cholesterol: 165 mg/dL (ref 0–200)
HDL: 47.2 mg/dL (ref >39.00)
LDL Cholesterol: 102 mg/dL — ABNORMAL HIGH (ref 0–99)
NONHDL: 117.8
Triglycerides: 78 mg/dL (ref 0.0–149.0)
VLDL: 15.6 mg/dL (ref 0.0–40.0)

## 2014-10-30 LAB — URINALYSIS, ROUTINE W REFLEX MICROSCOPIC
BILIRUBIN URINE: NEGATIVE
Hgb urine dipstick: NEGATIVE
Ketones, ur: NEGATIVE
Nitrite: NEGATIVE
RBC / HPF: NONE SEEN (ref 0–?)
Specific Gravity, Urine: 1.01 (ref 1.000–1.030)
TOTAL PROTEIN, URINE-UPE24: NEGATIVE
Urine Glucose: NEGATIVE
Urobilinogen, UA: 0.2 (ref 0.0–1.0)
pH: 6.5 (ref 5.0–8.0)

## 2014-10-30 LAB — BASIC METABOLIC PANEL
BUN: 17 mg/dL (ref 6–23)
CALCIUM: 9.5 mg/dL (ref 8.4–10.5)
CHLORIDE: 103 meq/L (ref 96–112)
CO2: 30 mEq/L (ref 19–32)
Creatinine, Ser: 1 mg/dL (ref 0.4–1.2)
GFR: 57.96 mL/min — ABNORMAL LOW (ref >60.00)
Glucose, Bld: 87 mg/dL (ref 70–99)
Potassium: 4 mEq/L (ref 3.5–5.1)
SODIUM: 140 meq/L (ref 135–145)

## 2014-10-30 LAB — HEPATIC FUNCTION PANEL
ALK PHOS: 53 U/L (ref 39–117)
ALT: 13 U/L (ref 0–35)
AST: 25 U/L (ref 0–37)
Albumin: 3.9 g/dL (ref 3.5–5.2)
BILIRUBIN DIRECT: 0.2 mg/dL (ref 0.0–0.3)
BILIRUBIN TOTAL: 0.9 mg/dL (ref 0.2–1.2)
Total Protein: 8.7 g/dL — ABNORMAL HIGH (ref 6.0–8.3)

## 2014-10-30 NOTE — Progress Notes (Signed)
Pre visit review using our clinic review tool, if applicable. No additional management support is needed unless otherwise documented below in the visit note. 

## 2014-10-30 NOTE — Progress Notes (Signed)
Subjective:    Patient ID: [name] [name], female    DOB: 10-17-1936, 78 y.o.   MRN: [id number]  HPI  Here for wellness and f/u;  Overall doing ok;  Pt denies CP, worsening SOB, DOE, wheezing, orthopnea, PND, worsening LE edema, palpitations, dizziness or syncope.  Pt denies neurological change such as new headache, facial or extremity weakness.  Pt denies polydipsia, polyuria, or low sugar symptoms. Pt states overall good compliance with treatment and medications, good tolerability, and has been trying to follow lower cholesterol diet.  Pt denies worsening depressive symptoms, suicidal ideation or panic. No fever, night sweats, wt loss, loss of appetite, or other constitutional symptoms.  Pt states good ability with ADL's, has low fall risk, home safety reviewed and adequate, no other significant changes in hearing or vision, and only occasionally active with exercise.  Walks with cane, very careful to not fall.  Has some difficulty sleeping, No current complaints Past Medical History  Diagnosis Date  . Allergic rhinitis   . Sarcoidosis   . Pulmonary hypertension   . Asthmatic bronchitis   . Pulmonary embolism   . Hypertension   . CAD (coronary artery disease)   . Cardiac pacemaker in situ   . Venous insufficiency   . Hyperlipidemia   . Obesity   . PUD (peptic ulcer disease)   . Hemorrhoids   . Renal insufficiency   . UTI (lower urinary tract infection)   . DJD (degenerative joint disease)   . Cervicalgia   . Lumbar back pain   . Anxiety   . Myocardial infarction 2012  . CHF (congestive heart failure) 2012  . COPD (chronic obstructive pulmonary disease)   . Shortness of breath     OCCASIONAL  . GERD (gastroesophageal reflux disease)   . Cancer     VULVAR CANCER  . Pacemaker   . Stroke 2006    balance problems remain  . OSA (obstructive sleep apnea)     USES O2 AT HOME WITH NASAL MASK  . Gallstones   . ALLERGIC RHINITIS 12/10/2007    Qualifier: Diagnosis of  By: Lenna Gilford  MD, [place] 09/30/2007    Qualifier: Diagnosis of  By: Rosana Hoes CMA, [place]    . ASTHMATIC BRONCHITIS, ACUTE 09/30/2007    Qualifier: History of  By: Rosana Hoes CMA, Tammy    . BACK PAIN, LUMBAR 09/30/2007    Qualifier: Diagnosis of  By: Rosana Hoes CMA, Tammy    . CIN III (cervical intraepithelial neoplasia III) 05/28/2012  . CORONARY ARTERY DISEASE 09/05/2008    Qualifier: Diagnosis of  By: Lenna Gilford MD, [place], LEG, RIGHT 08/29/2010    Qualifier: Diagnosis of  By: Royal Piedra NP, Tammy    . DEGENERATIVE JOINT DISEASE 01/05/2009    Qualifier: Diagnosis of  By: Lenna Gilford MD, [place] 09/30/2007    Qualifier: Diagnosis of  By: Rosana Hoes CMA, [place]    . HYPERLIPIDEMIA 09/05/2008    Qualifier: Diagnosis of  By: Lenna Gilford MD, Deborra Medina   . HYPERTENSION 12/10/2007    Qualifier: Diagnosis of  By: Lenna Gilford MD, Deborra Medina   . OA (osteoarthritis) of knee 04/21/2013  . OBESITY 12/10/2007    Qualifier: Diagnosis of  By: Lenna Gilford MD, [name] 07/11/2009    Qualifier: Diagnosis of  By: Lenna Gilford MD, Deborra Medina   . Postop Hypokalemia 04/22/2013  . Postoperative anemia due to acute blood loss  04/22/2013  . PUD 12/10/2007    Qualifier: History of  By: Lenna Gilford MD, Deborra Medina   . PULMONARY EMBOLISM 07/22/2010    Qualifier: Diagnosis of  By: Lenna Gilford MD, Deborra Medina   . PULMONARY HYPERTENSION 09/30/2007    Qualifier: Diagnosis of  By: Rosana Hoes CMA, Tammy    . RENAL INSUFFICIENCY 08/17/2010    Qualifier: Diagnosis of  By: Lenna Gilford MD, [place] 09/30/2007    Qualifier: History of  By: Rosana Hoes CMA, Tammy    . SYNCOPE 12/11/2007    Qualifier: History of  By: Lenna Gilford MD, Deborra Medina   . UTI 08/17/2010    Qualifier: Diagnosis of  By: Lenna Gilford MD, Deborra Medina   . VENOUS INSUFFICIENCY 12/10/2007    Qualifier: Diagnosis of  By: Lenna Gilford MD, Deborra Medina   . Impaired glucose tolerance 04/29/2014   Past Surgical History  Procedure Laterality Date  . Appendectomy    . Cholecystectomy  01/1996    Dr.  Janan Halter  . Abdominal hysterectomy    . Lumbar disc surgery  1990    X2  . Left knee arthroscopy    . Ivc filter placed  07/2010  . Pacemaker insertion  2008  . Vulvar lesion removal  07/16/2012    Procedure: VULVAR LESION;  Surgeon: Imagene Gurney A. Alycia Rossetti, MD;  Location: [facility];  Service: Gynecology;  Laterality: Right;  Wide Local Excision of the Vulvar  . Total knee arthroplasty Left 04/21/2013    Procedure: LEFT TOTAL KNEE ARTHROPLASTY;  Surgeon: Gearlean Alf, MD;  Location: [facility];  Service: Orthopedics;  Laterality: Left;    reports that she has never smoked. She has never used smokeless tobacco. She reports that she does not drink alcohol or use illicit drugs. family history includes Breast cancer in her sister; Heart attack in her brother and father; Kidney failure in her sister. Allergies  Allergen Reactions  . Dymista [Azelastine-Fluticasone] Other (See Comments)    Headaches  . Sulfonamide Derivatives Nausea Only  . Imitrex [Sumatriptan] Other (See Comments)    Reaction unknown  . Sulfa Antibiotics Other (See Comments)    Doesn't like to drink lots of water.   Current Outpatient Prescriptions on File Prior to Visit  Medication Sig Dispense Refill  . acetaminophen (TYLENOL) 325 MG tablet Take 2 tablets (650 mg total) by mouth every 6 (six) hours as needed. 60 tablet 0  . Azelastine-Fluticasone (DYMISTA) 137-50 MCG/ACT SUSP Place 2 sprays into the nose every morning.     . cetirizine (ZYRTEC) 10 MG tablet Take 10 mg by mouth daily.     . cholecalciferol (VITAMIN D) 1000 UNITS tablet Take 1,000 Units by mouth daily.    . clorazepate (TRANXENE) 7.5 MG tablet Take 1 tablet (7.5 mg total) by mouth 3 (three) times daily. 90 tablet 3  . FLOVENT HFA 44 MCG/ACT inhaler Inhale 2 puffs two times  daily 51.7 g 6  . folic acid (FOLVITE) 001 MCG tablet Take 400 mcg by mouth daily.    . furosemide (LASIX) 20 MG tablet Take 1 tablet (20 mg total) by mouth 2 (two) times daily. 180 tablet 3  .  ipratropium-albuterol (DUONEB) 0.5-2.5 (3) MG/3ML SOLN Take 3 mLs by nebulization 2 (two) times daily. 180 mL 5  . metoprolol succinate (TOPROL-XL) 25 MG 24 hr tablet Take 1 tablet (25 mg total) by mouth daily before breakfast. 90 tablet 3  . Multiple Vitamin (MULTIVITAMIN) tablet Take 1 tablet by mouth daily.    [name] Kitchen  Multiple Vitamins-Minerals (PROTEGRA CARDIO PO) Take 1 tablet by mouth daily.    . pantoprazole (PROTONIX) 40 MG tablet Take 1 tablet (40 mg total) by mouth 2 (two) times daily. 180 tablet 3  . Polyethyl Glycol-Propyl Glycol (SYSTANE) 0.4-0.3 % SOLN Place 2 drops into both eyes 4 (four) times daily.     . pravastatin (PRAVACHOL) 40 MG tablet Take 40 mg by mouth at bedtime.    . Rivaroxaban (XARELTO) 20 MG TABS tablet Take 1 tablet by mouth  daily 90 tablet 3  . sennosides-docusate sodium (SENOKOT-S) 8.6-50 MG tablet Take 2 tablets by mouth at bedtime.      No current facility-administered medications on file prior to visit.    Review of Systems Constitutional: Negative for increased diaphoresis, other activity, appetite or other siginficant weight change  HENT: Negative for worsening hearing loss, ear pain, facial swelling, mouth sores and neck stiffness.   Eyes: Negative for other worsening pain, redness or visual disturbance.  Respiratory: Negative for shortness of breath and wheezing.   Cardiovascular: Negative for chest pain and palpitations.  Gastrointestinal: Negative for diarrhea, blood in stool, abdominal distention or other pain Genitourinary: Negative for hematuria, flank pain or change in urine volume.  Musculoskeletal: Negative for myalgias or other joint complaints.  Skin: Negative for color change and wound.  Neurological: Negative for syncope and numbness. other than noted Hematological: Negative for adenopathy. or other swelling Psychiatric/Behavioral: Negative for hallucinations, self-injury, decreased concentration or other worsening agitation.      Objective:    Physical Exam BP 148/90 mmHg  Pulse 94  Temp(Src) 98.2 F (36.8 C) (Oral)  Ht 5' 4.5" (1.638 m)  Wt 182 lb (82.555 kg)  BMI 30.77 kg/m2  SpO2 93% VS noted,  Constitutional: Pt is oriented to person, place, and time. Appears well-developed and well-nourished.  Head: Normocephalic and atraumatic.  Right Ear: External ear normal.  Left Ear: External ear normal.  Nose: Nose normal.  Mouth/Throat: Oropharynx is clear and moist.  Eyes: Conjunctivae and EOM are normal. Pupils are equal, round, and reactive to light.  Neck: Normal range of motion. Neck supple. No JVD present. No tracheal deviation present.  Cardiovascular: Normal rate, regular rhythm, normal heart sounds and intact distal pulses.   Pulmonary/Chest: Effort normal and breath sounds without rales or wheezing  Abdominal: Soft. Bowel sounds are normal. NT. No HSM  Musculoskeletal: Normal range of motion. Exhibits no edema.  Lymphadenopathy:  Has no cervical adenopathy.  Neurological: Pt is alert and oriented to person, place, and time. Pt has normal reflexes. No cranial nerve deficit. Motor grossly intact, somewhat wide based gait Skin: Skin is warm and dry. No rash noted.  Psychiatric:  Has normal mood and affect. Behavior is normal.     Assessment & Plan:

## 2014-10-30 NOTE — Patient Instructions (Signed)

## 2014-10-31 NOTE — Assessment & Plan Note (Signed)

## 2014-11-10 ENCOUNTER — Ambulatory Visit: Payer: Medicare Other | Admitting: Pulmonary Disease

## 2014-11-16 ENCOUNTER — Encounter: Payer: Self-pay | Admitting: Internal Medicine

## 2014-11-16 ENCOUNTER — Ambulatory Visit (INDEPENDENT_AMBULATORY_CARE_PROVIDER_SITE_OTHER)
Admission: RE | Admit: 2014-11-16 | Discharge: 2014-11-16 | Disposition: A | Discharge status: Home / Self Care | Payer: Medicare Other | Source: Ambulatory Visit | Attending: Internal Medicine | Admitting: Internal Medicine

## 2014-11-16 ENCOUNTER — Ambulatory Visit (INDEPENDENT_AMBULATORY_CARE_PROVIDER_SITE_OTHER): Payer: Medicare Other | Admitting: Internal Medicine

## 2014-11-16 VITALS — BP 122/80 | HR 90 | Ht 64.5 in | Wt 179.3 lb

## 2014-11-16 DIAGNOSIS — R0789 Other chest pain: Secondary | ICD-10-CM

## 2014-11-16 DIAGNOSIS — J069 Acute upper respiratory infection, unspecified: Secondary | ICD-10-CM

## 2014-11-16 MED ORDER — AZELASTINE HCL 0.15 % NA SOLN: NASAL | Status: DC

## 2014-11-16 MED ORDER — FLUTICASONE PROPIONATE 50 MCG/ACT NA SUSP: NASAL | Status: DC

## 2014-11-16 NOTE — Progress Notes (Signed)
06/09/14- 27 yof never smoker referred courtesy of Dr Blair Heys for allergy evaluation.  Hx OSA- Dr Gwenette Greet. Dr Lenna Gilford has seen for pulmonary- hx sarcoid, DVT/PE, CAD/ Pacemaker, asthmatic bronchitis, allergic rhinitis, cancer cervix Patient complains of sinus congestion and morning rhinorrhea x30 years or more. Triggers include spring and fall pollens, weather changes. Blames drainage for hoarseness and sneezing. Wears oxygen 2.5 L at night with humidifier on her oxygen. Skin testing 10 years ago with allergy shots reported helpful x2 years. Now in the past 3-5 years she has had more problems with nasal pressure, congestion, sneezing and drainage. Chest tightness and dyspnea managed with nebulizer twice daily, Flovent 44. She says she has not been told she has "asthma". We briefly reviewed her complicated medical history. No ENT surgery. Family history-allergies, asthma, heart disease, clotting disorders. She lives alone, retired from office work. Does not recognize significant exposures in her home.  07/29/14- 15 yoF never smoker followed for allergic rhinitis, hx sarcoid, DVT/PE, CAD/ Pacemaker, Hx OSA- Dr Gwenette Greet.  hx sarcoid, DVT/PE, CAD/ Pacemaker, Wears oxygen 2.5 L at night with humidifier on her oxygen/ nasal mask Weather change blamed for hacking cough. Nasal O2  2.5/ L/ [employer]/ sleep-at night dries nose, made worse by Dymista. Liked how Dymista stopped post nasal drip and reduced cough. Magic mouthwash helps throat irritation and thus helps cough.  Allergy profile 06/09/14- Total IgE 3.4- negative for specific or total IgE elevations against common environmental allergens.  09/30/14-77 yoF never smoker followed for allergic rhinitis, hx sarcoid, DVT/PE, CAD/ Pacemaker, Hx OSA- Dr Gwenette Greet.  hx sarcoid, DVT/PE, CAD/ Pacemaker, Wears oxygen 2.5 L at night with humidifier on her oxygen/ nasal mask FOLLOW FOR: allergies, dry cough, SOB, Back pain today  11/16/14- 77 yoF never smoker followed for  allergic rhinitis, hx sarcoid, DVT/PE, CAD/ Pacemaker,Hx OSA- Dr Gwenette Greet. Wears oxygen 2.5 L/ [employer] at night with humidifier on her oxygen/ nasal mask ACUTE VISIT: cough, chest tightness in left side of chest(almost like a pulled muscle), SOB and wheezing. Denies any fevers. Has been around son that is sick as well. Caught cold from son. Sore L parasternal area, radiating through to scapula. Tender to touch. Not at pacemaker site.No fever or green.  ROS-see HPI Constitutional:   No-   weight loss, night sweats, fevers, chills, fatigue, lassitude. HEENT:   No-  headaches, difficulty swallowing, tooth/dental problems, sore throat,       No-  sneezing, itching, ear ache, +nasal congestion, +post nasal drip,  CV:  +chest pain, No-orthopnea, PND, swelling in lower extremities, anasarca,                                  dizziness, palpitations Resp: + shortness of breath with exertion or at rest.              No-   productive cough,  + non-productive cough,  No- coughing up of blood.              No-   change in color of mucus.  + wheezing.   Skin: No-   rash or lesions. GI:  No-   heartburn, indigestion, abdominal pain, nausea, vomiting,  GU:  MS:  No-   joint pain or swelling.  . Neuro-     nothing unusual Psych:  No- change in mood or affect. No depression or anxiety.  No memory loss.  OBJ- Physical Exam General- Alert, Oriented, Affect-appropriate,  Distress- none acute Skin- rash-none, lesions- none, excoriation- none Lymphadenopathy- none Head- atraumatic            Eyes- Gross vision intact, PERRLA, conjunctivae and secretions clear            Ears- Hearing, canals-normal            Nose- Clear, +Septal dev/ Narrow on the left, no-mucus, polyps, erosion, perforation             Throat- Mallampati II , mucosa clear , drainage- none, tonsils- atrophic Neck- flexible , trachea midline, no stridor , thyroid nl, carotid no bruit Chest - symmetrical excursion , unlabored           Heart/CV-  RRR , no murmur , no gallop  , no rub, nl s1 s2                           - JVD- none , edema- none, stasis changes- none, varices- none           Lung- clear to P&A, wheeze- none, cough-+mild , dullness-none, rub- none           Chest wall- +L pacer, +tender L parasternal Abd-  Br/ Gen/ Rectal- Not done, not indicated Extrem- cyanosis- none, clubbing, none, atrophy- none, strength- nl, + support stockings Neuro- grossly intact to observation

## 2014-11-16 NOTE — Patient Instructions (Addendum)
Order- CXR  Dx atypical left chest pain  Script sent for azelastine and for flonase, to use both together instead of Dymista  Please call as needed

## 2014-11-24 ENCOUNTER — Other Ambulatory Visit: Payer: Self-pay | Admitting: *Deleted

## 2014-11-24 MED ORDER — PRAVASTATIN SODIUM 40 MG PO TABS
40.0000 mg | ORAL_TABLET | Freq: Every day | ORAL | Status: DC
Start: 2014-11-24 — End: 2015-10-16

## 2014-12-01 ENCOUNTER — Ambulatory Visit (INDEPENDENT_AMBULATORY_CARE_PROVIDER_SITE_OTHER): Payer: Medicare Other | Admitting: *Deleted

## 2014-12-01 DIAGNOSIS — I442 Atrioventricular block, complete: Secondary | ICD-10-CM

## 2014-12-01 NOTE — Progress Notes (Signed)
Remote pacemaker transmission.   

## 2014-12-02 LAB — MDC_IDC_ENUM_SESS_TYPE_REMOTE
Battery Impedance: 1984 Ohm
Brady Statistic AP VS Percent: 0 %
Brady Statistic AS VP Percent: 85 %
Brady Statistic AS VS Percent: 0 %
Date Time Interrogation Session: 20151215134016
Lead Channel Impedance Value: 489 Ohm
Lead Channel Pacing Threshold Amplitude: 0.5 V
Lead Channel Pacing Threshold Amplitude: 1 V
Lead Channel Pacing Threshold Pulse Width: 0.4 ms
Lead Channel Sensing Intrinsic Amplitude: 2.8 mV
Lead Channel Setting Pacing Amplitude: 2 V
MDC IDC MSMT BATTERY REMAINING LONGEVITY: 29 mo
MDC IDC MSMT BATTERY VOLTAGE: 2.75 V
MDC IDC MSMT LEADCHNL RA IMPEDANCE VALUE: 381 Ohm
MDC IDC MSMT LEADCHNL RV PACING THRESHOLD PULSEWIDTH: 0.4 ms
MDC IDC SET LEADCHNL RV PACING AMPLITUDE: 2.5 V
MDC IDC SET LEADCHNL RV PACING PULSEWIDTH: 0.4 ms
MDC IDC SET LEADCHNL RV SENSING SENSITIVITY: 2.8 mV
MDC IDC STAT BRADY AP VP PERCENT: 14 %

## 2014-12-04 ENCOUNTER — Encounter: Payer: Self-pay | Admitting: Cardiology

## 2014-12-08 ENCOUNTER — Encounter: Payer: Self-pay | Admitting: Internal Medicine

## 2014-12-21 DIAGNOSIS — J069 Acute upper respiratory infection, unspecified: Secondary | ICD-10-CM | POA: Insufficient documentation

## 2014-12-21 DIAGNOSIS — R0789 Other chest pain: Secondary | ICD-10-CM | POA: Insufficient documentation

## 2014-12-21 NOTE — Assessment & Plan Note (Signed)
Viral syndrome. Discussed symptomatic management. Contrasted seasonal allergy. Plan- refilled azelastine and flonase nasal sprays.

## 2014-12-21 NOTE — Assessment & Plan Note (Signed)
Likely tussive ribcage pains. To be safe, will get CXR P- CXR

## 2015-01-31 ENCOUNTER — Other Ambulatory Visit: Payer: Self-pay | Admitting: Hematology & Oncology

## 2015-02-01 ENCOUNTER — Ambulatory Visit: Payer: Self-pay | Admitting: Gynecologic Oncology

## 2015-02-09 ENCOUNTER — Telehealth: Payer: Self-pay | Admitting: Internal Medicine

## 2015-02-09 NOTE — Telephone Encounter (Signed)
136/84 mmHg 88 5' 4.5" (1.638 m) 181 lb 6.4 oz

## 2015-02-09 NOTE — Telephone Encounter (Signed)
New message    [facility]  calling     Need most recent blood pressure and heart rate from last office visit.    She spoke with medical records was advise to call back speak with nurse.

## 2015-02-10 ENCOUNTER — Ambulatory Visit: Payer: Medicare Other | Attending: Gynecologic Oncology | Admitting: Gynecologic Oncology

## 2015-02-10 ENCOUNTER — Encounter: Payer: Self-pay | Admitting: Gynecologic Oncology

## 2015-02-10 VITALS — BP 155/79 | HR 79 | Temp 97.9°F | Resp 18 | Ht 64.5 in | Wt 181.2 lb

## 2015-02-10 DIAGNOSIS — Z7901 Long term (current) use of anticoagulants: Secondary | ICD-10-CM

## 2015-02-10 DIAGNOSIS — D071 Carcinoma in situ of vulva: Secondary | ICD-10-CM | POA: Diagnosis present

## 2015-02-10 DIAGNOSIS — I252 Old myocardial infarction: Secondary | ICD-10-CM

## 2015-02-10 NOTE — Progress Notes (Signed)
Consult Note: Gyn-Onc  Consult was requested by Dr. Benjie Karvonen for the evaluation of [name] [name] 79 y.o. female with VIN3  CC:  Chief Complaint  Patient presents with  . VIN III    Assessment/Plan:  [name] [name]  is a 79 y.o.  year old with recurrent right labial VIN 3. The patient has multiple medical comorbidities including heart valve disease congestive heart failure and history of an MI. She is on Xarelto anticoagulation.  I am recommending an excisional procedure of this lesion with wide local excision of the vulva. I discussed surgical risks including bleeding and infection and wound separation. I discussed postoperative perineal care and means to reduce the likelihood of wound separation. I discussed that she is at increased risk for bleeding, occasions given her Xarelto use. We will contact her cardiologist preoperatively to ascertain the safest time. For her to hold her Xarelto to minimize his bleeding risk. I discussed that this condition of vulvar dysplasia tends to be a recurrent one. She has a 25% risk for recurrence of the lesion after the surgery.   HPI: [name] [name] is a 79 year old woman with a history of VIN-III of the right vulva who is seen in consultation at the request of Dr Benjie Karvonen for [facility].  She presented to Dr. Benjie Karvonen earlier this month in February for symptoms of vulvar pruritus. Dr. Genia Harold identified a lesion concerning for VIN on the right labia minora and performed a biopsy of this. Pathology revealed severe squamous dysplasia, carcinoma in situ. There was no invasive malignancy in the specimen.  The patient has a history of a prior right partial simple vulvectomy for the same process several years ago. She does not remember who performed a surgery. Of note her other medical history is particularly significant for a hysterectomy at age 44 for non-oncologic reasons. She also has a history of a myocardial infarction in 2012 history with a stent. She has cardiac valve  disease and heart failure and takes Xarelto. Dr. Synetta Fail is her cardiologist.  Current Meds:  Outpatient Encounter Prescriptions as of 02/10/2015  Medication Sig  . acetaminophen (TYLENOL) 325 MG tablet Take 2 tablets (650 mg total) by mouth every 6 (six) hours as needed.  . cetirizine (ZYRTEC) 10 MG tablet Take 10 mg by mouth daily.   . cholecalciferol (VITAMIN D) 1000 UNITS tablet Take 1,000 Units by mouth daily.  . clorazepate (TRANXENE) 7.5 MG tablet Take 1 tablet (7.5 mg total) by mouth 3 (three) times daily.  [name] Kitchen FLOVENT HFA 44 MCG/ACT inhaler Inhale 2 puffs two times  daily  . fluticasone (FLONASE) 50 MCG/ACT nasal spray 1-2 puffs each nostril once daily  . folic acid (FOLVITE) 923 MCG tablet Take 400 mcg by mouth daily.  . furosemide (LASIX) 20 MG tablet Take 1 tablet (20 mg total) by mouth 2 (two) times daily.  [name] Kitchen ipratropium-albuterol (DUONEB) 0.5-2.5 (3) MG/3ML SOLN Take 3 mLs by nebulization 2 (two) times daily.  . metoprolol succinate (TOPROL-XL) 25 MG 24 hr tablet Take 1 tablet (25 mg total) by mouth daily before breakfast.  . Multiple Vitamin (MULTIVITAMIN) tablet Take 1 tablet by mouth daily.  [name] Kitchen nystatin-triamcinolone (MYCOLOG II) cream Apply 1 application topically at bedtime.   . pantoprazole (PROTONIX) 40 MG tablet Take 1 tablet (40 mg total) by mouth 2 (two) times daily.  Vladimir Faster Glycol-Propyl Glycol (SYSTANE) 0.4-0.3 % SOLN Place 2 drops into both eyes 4 (four) times daily.   . pravastatin (PRAVACHOL) 40 MG tablet Take  1 tablet (40 mg total) by mouth at bedtime.  . sennosides-docusate sodium (SENOKOT-S) 8.6-50 MG tablet Take 2 tablets by mouth at bedtime.   . silver sulfADIAZINE (SILVADENE) 1 % cream Apply 1 application topically 2 (two) times daily.   Alveda Reasons 20 MG TABS tablet Take 1 tablet by mouth  daily  . Azelastine HCl 0.15 % SOLN 1-2 puffs each nostril once daily (Patient not taking: Reported on 02/10/2015)  . [DISCONTINUED] Multiple Vitamins-Minerals  (PROTEGRA CARDIO PO) Take 1 tablet by mouth daily.    Allergy:  Allergies  Allergen Reactions  . Dymista [Azelastine-Fluticasone] Other (See Comments)    Headaches  . Sulfonamide Derivatives Nausea Only  . Imitrex [Sumatriptan] Other (See Comments)    Reaction unknown  . Sulfa Antibiotics Other (See Comments)    Doesn't like to drink lots of water.    Social Hx:   History   Social History  . Marital Status: Widowed    Spouse Name: N/A  . Number of Children: 4  . Years of Education: N/A   Occupational History  . retired    Social History Main Topics  . Smoking status: Never Smoker   . Smokeless tobacco: Never Used  . Alcohol Use: No  . Drug Use: No  . Sexual Activity: No   Other Topics Concern  . Not on file   Social History Narrative    Past Surgical Hx:  Past Surgical History  Procedure Laterality Date  . Appendectomy    . Cholecystectomy  01/1996    Dr. Janan Halter  . Abdominal hysterectomy    . Lumbar disc surgery  1990    X2  . Left knee arthroscopy    . Ivc filter placed  07/2010  . Pacemaker insertion  2008  . Vulvar lesion removal  07/16/2012    Procedure: VULVAR LESION;  Surgeon: Imagene Gurney A. Alycia Rossetti, MD;  Location: [facility];  Service: Gynecology;  Laterality: Right;  Wide Local Excision of the Vulvar  . Total knee arthroplasty Left 04/21/2013    Procedure: LEFT TOTAL KNEE ARTHROPLASTY;  Surgeon: Gearlean Alf, MD;  Location: [facility];  Service: Orthopedics;  Laterality: Left;    Past Medical Hx:  Past Medical History  Diagnosis Date  . Allergic rhinitis   . Sarcoidosis   . Pulmonary hypertension   . Asthmatic bronchitis   . Pulmonary embolism   . Hypertension   . CAD (coronary artery disease)   . Cardiac pacemaker in situ   . Venous insufficiency   . Hyperlipidemia   . Obesity   . PUD (peptic ulcer disease)   . Hemorrhoids   . Renal insufficiency   . UTI (lower urinary tract infection)   . DJD (degenerative joint disease)   . Cervicalgia   .  Lumbar back pain   . Anxiety   . Myocardial infarction 2012  . CHF (congestive heart failure) 2012  . COPD (chronic obstructive pulmonary disease)   . Shortness of breath     OCCASIONAL  . GERD (gastroesophageal reflux disease)   . Cancer     VULVAR CANCER  . Pacemaker   . Stroke 2006    balance problems remain  . OSA (obstructive sleep apnea)     USES O2 AT HOME WITH NASAL MASK  . Gallstones   . ALLERGIC RHINITIS 12/10/2007    Qualifier: Diagnosis of  By: Lenna Gilford MD, [place] 09/30/2007    Qualifier: Diagnosis of  By: Rosana Hoes CMA, Tam[place]    .  ASTHMATIC BRONCHITIS, ACUTE 09/30/2007    Qualifier: History of  By: Rosana Hoes CMA, Tammy    . BACK PAIN, LUMBAR 09/30/2007    Qualifier: Diagnosis of  By: Rosana Hoes CMA, Tammy    . CIN III (cervical intraepithelial neoplasia III) 05/28/2012  . CORONARY ARTERY DISEASE 09/05/2008    Qualifier: Diagnosis of  By: Lenna Gilford MD, [place], LEG, RIGHT 08/29/2010    Qualifier: Diagnosis of  By: Royal Piedra NP, Tammy    . DEGENERATIVE JOINT DISEASE 01/05/2009    Qualifier: Diagnosis of  By: Lenna Gilford MD, [place] 09/30/2007    Qualifier: Diagnosis of  By: Rosana Hoes CMA, [place]    . HYPERLIPIDEMIA 09/05/2008    Qualifier: Diagnosis of  By: Lenna Gilford MD, Deborra Medina   . HYPERTENSION 12/10/2007    Qualifier: Diagnosis of  By: Lenna Gilford MD, Deborra Medina   . OA (osteoarthritis) of knee 04/21/2013  . OBESITY 12/10/2007    Qualifier: Diagnosis of  By: Lenna Gilford MD, [place] 07/11/2009    Qualifier: Diagnosis of  By: Lenna Gilford MD, Deborra Medina   . Postop Hypokalemia 04/22/2013  . Postoperative anemia due to acute blood loss 04/22/2013  . PUD 12/10/2007    Qualifier: History of  By: Lenna Gilford MD, Deborra Medina   . PULMONARY EMBOLISM 07/22/2010    Qualifier: Diagnosis of  By: Lenna Gilford MD, Deborra Medina   . PULMONARY HYPERTENSION 09/30/2007    Qualifier: Diagnosis of  By: Rosana Hoes CMA, Tammy    . RENAL INSUFFICIENCY 08/17/2010    Qualifier: Diagnosis of  By:  Lenna Gilford MD, [place] 09/30/2007    Qualifier: History of  By: Rosana Hoes CMA, Tammy    . SYNCOPE 12/11/2007    Qualifier: History of  By: Lenna Gilford MD, Deborra Medina   . UTI 08/17/2010    Qualifier: Diagnosis of  By: Lenna Gilford MD, Deborra Medina   . VENOUS INSUFFICIENCY 12/10/2007    Qualifier: Diagnosis of  By: Lenna Gilford MD, Deborra Medina   . Impaired glucose tolerance 04/29/2014    Past Gynecological History:  Hx of VIN3, hx of hysterectomy for benign condition, hx of SVD.  No LMP recorded. Patient is postmenopausal.  Family Hx:  Family History  Problem Relation Age of Onset  . Heart attack Father   . Breast cancer Sister     mets  . Heart attack Brother   . Kidney failure Sister     Review of Systems:  Constitutional  Feels well,    ENT Normal appearing ears and nares bilaterally Skin/Breast  No rash, sores, jaundice, itching, dryness Cardiovascular  No chest pain, shortness of breath, or edema  Pulmonary  No cough or wheeze.  Gastro Intestinal  No nausea, vomitting, or diarrhoea. No bright red blood per rectum, no abdominal pain, change in bowel movement, or constipation.  Genito Urinary  No frequency, urgency, dysuria, + vulvar pruritis. Musculo Skeletal  No myalgia, arthralgia, joint swelling or pain  Neurologic  No weakness, numbness, change in gait,  Psychology  No depression, anxiety, insomnia.   Vitals:  Blood pressure 155/79, pulse 79, temperature 97.9 F (36.6 C), temperature source Oral, resp. rate 18, height 5' 4.5" (1.638 m), weight 181 lb 3.2 oz (82.192 kg).  Physical Exam: WD in NAD Neck  Supple NROM, without any enlargements.  Lymph Node Survey No cervical supraclavicular or inguinal adenopathy Cardiovascular  Pulse normal rate, regularity and rhythm. S1 and S2 normal.  Lungs  Clear to auscultation bilateraly, without wheezes/crackles/rhonchi. Good air movement.  Skin  No rash/lesions/breakdown  Psychiatry  Alert and oriented to person, place, and time  Abdomen   Normoactive bowel sounds, abdomen soft, non-tender and overweight without evidence of hernia.  Back No CVA tenderness Genito Urinary  Vulva/vagina: horseshoe shaped lesion on the right labia minora, extending across perineal body to left perineal body, extending into distal vagina. Slightly raised area on the right lateral edge. Most consistent with VIN3. >2cm from urethra.  Bladder/urethra:  No lesions or masses, well supported bladder  Vagina: see above.  Cervix: surgically absent  Uterus: surgically absent  Adnexa:  No masses. Rectal  Good tone, no masses no cul de sac nodularity.  Extremities  No bilateral cyanosis, clubbing or edema.   Donaciano Eva, MD   02/10/2015, 2:26 PM

## 2015-02-10 NOTE — Patient Instructions (Signed)
Plan for procedure Friday, March 4 at [facility]. You will receive a phone call the night before with instructions and what time to arrive for the procedure.   We will contact you about when you need to stop taking Xarelto.

## 2015-02-12 ENCOUNTER — Encounter: Payer: Self-pay | Admitting: *Deleted

## 2015-02-12 ENCOUNTER — Telehealth: Payer: Self-pay | Admitting: Internal Medicine

## 2015-02-12 ENCOUNTER — Telehealth: Payer: Self-pay | Admitting: *Deleted

## 2015-02-12 ENCOUNTER — Telehealth: Payer: Self-pay | Admitting: Hematology & Oncology

## 2015-02-12 NOTE — Telephone Encounter (Signed)
Xarelto was prescribed by Dr. Burney Gauze.  Will forward to his office for review.

## 2015-02-12 NOTE — Progress Notes (Signed)
Patient has surgery next Friday. Office called Dr Marin Olp since we manage her Xarelto. Dr Marin Olp wants Xarelto to stop on Tuesday March 2nd. She can restart the Xarelto on Saturday March 5th. Information relayed to [employer].

## 2015-02-12 NOTE — Telephone Encounter (Signed)
Called patient regarding stopping Xarelto pre-op. Dr Marin Olp wants Xarelto to stop on Tuesday March 2nd. She can restart the Xarelto on Saturday March 5th. Patient wrote down the instructions and is agreeable to them. Told patient to call us back with any further questions or concerns prior to surgery on 02/19/15 - patient agreeable to this.

## 2015-02-12 NOTE — Telephone Encounter (Signed)
Request for surgical clearance:  1. What type of surgery is being performed? Wide local excision of her vulva  2. When is this surgery scheduled? 02/19/2015   3. Are there any medications that need to be held prior to surgery and how long? Xarelto   4. Name of physician performing surgery? Trena Platt  5. What is your office phone and fax number? Fax: [phone] Direct line [phone] 6. Notes are in EPIC.[name] Kitchen When can she stop xarelto   GYN oncology.[name] Kitchen

## 2015-02-12 NOTE — Telephone Encounter (Signed)
Pt aware of 3-24 appointment °

## 2015-02-15 ENCOUNTER — Telehealth: Payer: Self-pay | Admitting: Internal Medicine

## 2015-02-15 NOTE — Telephone Encounter (Signed)
Pt is aware to use both nasal sprays. Advised her the Azetline and Fluticasone are Dymista. She verbalized understanding. Nothing further was needed.

## 2015-02-16 ENCOUNTER — Encounter (HOSPITAL_BASED_OUTPATIENT_CLINIC_OR_DEPARTMENT_OTHER): Payer: Self-pay | Admitting: *Deleted

## 2015-02-17 ENCOUNTER — Encounter (HOSPITAL_BASED_OUTPATIENT_CLINIC_OR_DEPARTMENT_OTHER): Payer: Self-pay | Admitting: *Deleted

## 2015-02-17 NOTE — Progress Notes (Addendum)
NPO AFTER MN. ARRIVE AT [id number]. NEEDS ISTAT AND EKG. WILL TAKE TOPROL, PROTONIX, ZYRTEC, TRANXENE,  AND DO  DUOBEB, FLOVENT INHALER, NASAL SPRAYS AM DOS W/ SIPS OF WATER.

## 2015-02-19 ENCOUNTER — Other Ambulatory Visit: Payer: Self-pay

## 2015-02-19 ENCOUNTER — Ambulatory Visit (HOSPITAL_BASED_OUTPATIENT_CLINIC_OR_DEPARTMENT_OTHER): Payer: Medicare Other | Admitting: Anesthesiology

## 2015-02-19 ENCOUNTER — Ambulatory Visit (HOSPITAL_BASED_OUTPATIENT_CLINIC_OR_DEPARTMENT_OTHER)
Admission: RE | Admit: 2015-02-19 | Discharge: 2015-02-19 | Disposition: A | Discharge status: Home / Self Care | Payer: Medicare Other | Source: Ambulatory Visit | Attending: Gynecologic Oncology | Admitting: Gynecologic Oncology

## 2015-02-19 ENCOUNTER — Encounter (HOSPITAL_BASED_OUTPATIENT_CLINIC_OR_DEPARTMENT_OTHER)
Admission: RE | Disposition: A | Discharge status: Home / Self Care | Payer: Self-pay | Source: Ambulatory Visit | Attending: Gynecologic Oncology

## 2015-02-19 ENCOUNTER — Encounter (HOSPITAL_BASED_OUTPATIENT_CLINIC_OR_DEPARTMENT_OTHER): Payer: Self-pay | Admitting: Gynecologic Oncology

## 2015-02-19 DIAGNOSIS — F419 Anxiety disorder, unspecified: Secondary | ICD-10-CM | POA: Diagnosis not present

## 2015-02-19 DIAGNOSIS — G4733 Obstructive sleep apnea (adult) (pediatric): Secondary | ICD-10-CM | POA: Diagnosis not present

## 2015-02-19 DIAGNOSIS — Z6829 Body mass index (BMI) 29.0-29.9, adult: Secondary | ICD-10-CM | POA: Insufficient documentation

## 2015-02-19 DIAGNOSIS — I272 Other secondary pulmonary hypertension: Secondary | ICD-10-CM | POA: Insufficient documentation

## 2015-02-19 DIAGNOSIS — I5040 Unspecified combined systolic (congestive) and diastolic (congestive) heart failure: Secondary | ICD-10-CM | POA: Diagnosis not present

## 2015-02-19 DIAGNOSIS — Z87440 Personal history of urinary (tract) infections: Secondary | ICD-10-CM | POA: Insufficient documentation

## 2015-02-19 DIAGNOSIS — Z8673 Personal history of transient ischemic attack (TIA), and cerebral infarction without residual deficits: Secondary | ICD-10-CM | POA: Insufficient documentation

## 2015-02-19 DIAGNOSIS — Z955 Presence of coronary angioplasty implant and graft: Secondary | ICD-10-CM | POA: Insufficient documentation

## 2015-02-19 DIAGNOSIS — J45909 Unspecified asthma, uncomplicated: Secondary | ICD-10-CM | POA: Insufficient documentation

## 2015-02-19 DIAGNOSIS — I252 Old myocardial infarction: Secondary | ICD-10-CM | POA: Diagnosis not present

## 2015-02-19 DIAGNOSIS — J449 Chronic obstructive pulmonary disease, unspecified: Secondary | ICD-10-CM | POA: Diagnosis not present

## 2015-02-19 DIAGNOSIS — I10 Essential (primary) hypertension: Secondary | ICD-10-CM | POA: Diagnosis not present

## 2015-02-19 DIAGNOSIS — Z7951 Long term (current) use of inhaled steroids: Secondary | ICD-10-CM | POA: Diagnosis not present

## 2015-02-19 DIAGNOSIS — Z90710 Acquired absence of both cervix and uterus: Secondary | ICD-10-CM | POA: Diagnosis not present

## 2015-02-19 DIAGNOSIS — Z86711 Personal history of pulmonary embolism: Secondary | ICD-10-CM | POA: Diagnosis not present

## 2015-02-19 DIAGNOSIS — Z86718 Personal history of other venous thrombosis and embolism: Secondary | ICD-10-CM | POA: Insufficient documentation

## 2015-02-19 DIAGNOSIS — Z9889 Other specified postprocedural states: Secondary | ICD-10-CM | POA: Diagnosis not present

## 2015-02-19 DIAGNOSIS — Z950 Presence of cardiac pacemaker: Secondary | ICD-10-CM | POA: Diagnosis not present

## 2015-02-19 DIAGNOSIS — M179 Osteoarthritis of knee, unspecified: Secondary | ICD-10-CM | POA: Insufficient documentation

## 2015-02-19 DIAGNOSIS — E669 Obesity, unspecified: Secondary | ICD-10-CM | POA: Insufficient documentation

## 2015-02-19 DIAGNOSIS — I2510 Atherosclerotic heart disease of native coronary artery without angina pectoris: Secondary | ICD-10-CM | POA: Diagnosis not present

## 2015-02-19 DIAGNOSIS — Z7952 Long term (current) use of systemic steroids: Secondary | ICD-10-CM | POA: Diagnosis not present

## 2015-02-19 DIAGNOSIS — D071 Carcinoma in situ of vulva: Secondary | ICD-10-CM | POA: Diagnosis present

## 2015-02-19 DIAGNOSIS — K219 Gastro-esophageal reflux disease without esophagitis: Secondary | ICD-10-CM | POA: Diagnosis not present

## 2015-02-19 DIAGNOSIS — Z79899 Other long term (current) drug therapy: Secondary | ICD-10-CM | POA: Diagnosis not present

## 2015-02-19 DIAGNOSIS — E785 Hyperlipidemia, unspecified: Secondary | ICD-10-CM | POA: Insufficient documentation

## 2015-02-19 DIAGNOSIS — Z8544 Personal history of malignant neoplasm of other female genital organs: Secondary | ICD-10-CM | POA: Insufficient documentation

## 2015-02-19 DIAGNOSIS — Z7901 Long term (current) use of anticoagulants: Secondary | ICD-10-CM | POA: Insufficient documentation

## 2015-02-19 HISTORY — DX: Personal history of peptic ulcer disease: Z87.11

## 2015-02-19 HISTORY — DX: Long term (current) use of anticoagulants: Z79.01

## 2015-02-19 HISTORY — DX: Personal history of transient ischemic attack (TIA), and cerebral infarction without residual deficits: Z86.73

## 2015-02-19 HISTORY — DX: Old myocardial infarction: I25.2

## 2015-02-19 HISTORY — DX: Unspecified osteoarthritis, unspecified site: M19.90

## 2015-02-19 HISTORY — DX: Personal history of other venous thrombosis and embolism: Z86.718

## 2015-02-19 HISTORY — DX: Mild intermittent asthma, uncomplicated: J45.20

## 2015-02-19 HISTORY — DX: Chronic combined systolic (congestive) and diastolic (congestive) heart failure: I50.42

## 2015-02-19 HISTORY — DX: Reserved for inherently not codable concepts without codable children: IMO0001

## 2015-02-19 HISTORY — DX: Pulmonary hypertension, unspecified: I27.20

## 2015-02-19 HISTORY — DX: Atherosclerotic heart disease of native coronary artery without angina pectoris: I25.10

## 2015-02-19 HISTORY — DX: Carcinoma in situ of vulva: D07.1

## 2015-02-19 HISTORY — DX: Paroxysmal atrial fibrillation: I48.0

## 2015-02-19 HISTORY — DX: Left bundle-branch block, unspecified: I44.7

## 2015-02-19 HISTORY — DX: Other primary thrombophilia: D68.59

## 2015-02-19 HISTORY — PX: VULVECTOMY: SHX1086

## 2015-02-19 HISTORY — DX: Obstructive sleep apnea (adult) (pediatric): G47.33

## 2015-02-19 HISTORY — DX: Personal history of cervical dysplasia: Z87.410

## 2015-02-19 HISTORY — DX: Personal history of pulmonary embolism: Z86.711

## 2015-02-19 LAB — POCT I-STAT 4, (NA,K, GLUC, HGB,HCT)
Glucose, Bld: 95 mg/dL (ref 70–99)
HEMATOCRIT: 42 % (ref 36.0–46.0)
HEMOGLOBIN: 14.3 g/dL (ref 12.0–15.0)
Potassium: 3.6 mmol/L (ref 3.5–5.1)
SODIUM: 145 mmol/L (ref 135–145)

## 2015-02-19 SURGERY — WIDE EXCISION VULVECTOMY
Anesthesia: General | Site: Vulva

## 2015-02-19 MED ORDER — FENTANYL CITRATE 0.05 MG/ML IJ SOLN
INTRAMUSCULAR | Status: DC | PRN
  Administered 2015-02-19: 25 ug via INTRAVENOUS

## 2015-02-19 MED ORDER — DOCUSATE SODIUM 100 MG PO CAPS: 100.0000 mg | ORAL_CAPSULE | Freq: Two times a day (BID) | ORAL | Status: DC

## 2015-02-19 MED ORDER — KETOROLAC TROMETHAMINE 30 MG/ML IJ SOLN
INTRAMUSCULAR | Status: DC | PRN
  Administered 2015-02-19: 15 mg via INTRAVENOUS

## 2015-02-19 MED ORDER — PROPOFOL 10 MG/ML IV BOLUS
INTRAVENOUS | Status: DC | PRN
  Administered 2015-02-19: 110 mg via INTRAVENOUS

## 2015-02-19 MED ORDER — IBUPROFEN 800 MG PO TABS: 800.0000 mg | ORAL_TABLET | Freq: Three times a day (TID) | ORAL | Status: DC | PRN

## 2015-02-19 MED ORDER — FENTANYL CITRATE 0.05 MG/ML IJ SOLN
25.0000 ug | INTRAMUSCULAR | Status: DC | PRN
  Filled 2015-02-19: qty 1

## 2015-02-19 MED ORDER — MIDAZOLAM HCL 2 MG/2ML IJ SOLN
INTRAMUSCULAR | Status: AC
  Filled 2015-02-19: qty 2

## 2015-02-19 MED ORDER — ACETAMINOPHEN 10 MG/ML IV SOLN
INTRAVENOUS | Status: DC | PRN
  Administered 2015-02-19: 1000 mg via INTRAVENOUS

## 2015-02-19 MED ORDER — LIDOCAINE HCL 1 % IJ SOLN
INTRAMUSCULAR | Status: DC | PRN
  Administered 2015-02-19: 10 mL

## 2015-02-19 MED ORDER — ONDANSETRON HCL 4 MG/2ML IJ SOLN
INTRAMUSCULAR | Status: DC | PRN
  Administered 2015-02-19: 4 mg via INTRAVENOUS

## 2015-02-19 MED ORDER — DEXAMETHASONE SODIUM PHOSPHATE 4 MG/ML IJ SOLN
INTRAMUSCULAR | Status: DC | PRN
  Administered 2015-02-19: 5 mg via INTRAVENOUS

## 2015-02-19 MED ORDER — LACTATED RINGERS IV SOLN
INTRAVENOUS | Status: DC
  Administered 2015-02-19: 09:00:00 via INTRAVENOUS
  Filled 2015-02-19: qty 1000

## 2015-02-19 MED ORDER — OXYCODONE-ACETAMINOPHEN 5-325 MG PO TABS: 1.0000 | ORAL_TABLET | ORAL | Status: DC | PRN

## 2015-02-19 MED ORDER — LIDOCAINE HCL (CARDIAC) 20 MG/ML IV SOLN
INTRAVENOUS | Status: DC | PRN
  Administered 2015-02-19: 50 mg via INTRAVENOUS

## 2015-02-19 MED ORDER — FENTANYL CITRATE 0.05 MG/ML IJ SOLN
INTRAMUSCULAR | Status: AC
  Filled 2015-02-19: qty 4

## 2015-02-19 MED ORDER — SODIUM CHLORIDE 0.9 % IR SOLN
Status: DC | PRN
  Administered 2015-02-19: 500 mL

## 2015-02-19 MED ORDER — LACTATED RINGERS IV SOLN
INTRAVENOUS | Status: DC
  Filled 2015-02-19: qty 1000

## 2015-02-19 MED ORDER — ACETIC ACID 5 % SOLN
Status: DC | PRN
  Administered 2015-02-19: 1 via TOPICAL

## 2015-02-19 MED ORDER — STERILE WATER FOR IRRIGATION IR SOLN
Status: DC | PRN
  Administered 2015-02-19: 500 mL

## 2015-02-19 SURGICAL SUPPLY — 36 items
BLADE SURG 15 STRL LF DISP TIS (BLADE) ×1 IMPLANT
BLADE SURG 15 STRL SS (BLADE) ×1
BRIEF STRETCH FOR OB PAD LRG (UNDERPADS AND DIAPERS) ×2 IMPLANT
CANISTER SUCTION 2500CC (MISCELLANEOUS) ×2 IMPLANT
CATH ROBINSON RED A/P 14FR (CATHETERS) ×2 IMPLANT
CLEANER CAUTERY TIP 5X5 PAD (MISCELLANEOUS) ×1 IMPLANT
COVER TABLE BACK 60X90 (DRAPES) ×2 IMPLANT
DRAPE LG THREE QUARTER DISP (DRAPES) ×2 IMPLANT
DRAPE UNDERBUTTOCKS STRL (DRAPE) ×2 IMPLANT
GAUZE SPONGE 4X4 12PLY STRL (GAUZE/BANDAGES/DRESSINGS) ×2 IMPLANT
GAUZE SPONGE 4X4 16PLY XRAY LF (GAUZE/BANDAGES/DRESSINGS) ×2 IMPLANT
GLOVE BIO SURGEON STRL SZ 6 (GLOVE) ×4 IMPLANT
GLOVE BIO SURGEON STRL SZ 6.5 (GLOVE) ×2 IMPLANT
GLOVE INDICATOR 6.5 STRL GRN (GLOVE) ×2 IMPLANT
GOWN STRL REUS W/ TWL LRG LVL3 (GOWN DISPOSABLE) ×1 IMPLANT
GOWN STRL REUS W/TWL LRG LVL3 (GOWN DISPOSABLE) ×1
LEGGING LITHOTOMY PAIR STRL (DRAPES) ×2 IMPLANT
NEEDLE HYPO 25X1 1.5 SAFETY (NEEDLE) ×2 IMPLANT
NS IRRIG 500ML POUR BTL (IV SOLUTION) ×2 IMPLANT
PACK BASIN DAY SURGERY FS (CUSTOM PROCEDURE TRAY) ×2 IMPLANT
PAD CLEANER CAUTERY TIP 5X5 (MISCELLANEOUS) ×1
PAD OB MATERNITY 4.3X12.25 (PERSONAL CARE ITEMS) ×2 IMPLANT
PAD PREP 24X48 CUFFED NSTRL (MISCELLANEOUS) ×2 IMPLANT
PENCIL BUTTON HOLSTER BLD 10FT (ELECTRODE) ×2 IMPLANT
SUT VIC AB 2-0 SH 27 (SUTURE) ×4
SUT VIC AB 2-0 SH 27X BRD (SUTURE) ×4 IMPLANT
SUT VIC AB 3-0 SH 27 (SUTURE) ×2
SUT VIC AB 3-0 SH 27X BRD (SUTURE) ×2 IMPLANT
SUT VICRYL 4-0 PS2 18IN ABS (SUTURE) ×16 IMPLANT
SYR BULB IRRIGATION 50ML (SYRINGE) ×2 IMPLANT
SYR CONTROL 10ML LL (SYRINGE) ×2 IMPLANT
TOWEL OR 17X24 6PK STRL BLUE (TOWEL DISPOSABLE) ×2 IMPLANT
TRAY DSU PREP LF (CUSTOM PROCEDURE TRAY) ×2 IMPLANT
TUBE CONNECTING 12X1/4 (SUCTIONS) ×2 IMPLANT
WATER STERILE IRR 500ML POUR (IV SOLUTION) ×2 IMPLANT
YANKAUER SUCT BULB TIP NO VENT (SUCTIONS) ×2 IMPLANT

## 2015-02-19 NOTE — Interval H&P Note (Signed)
History and Physical Interval Note:  02/19/2015 8:25 AM  [name] [name]  has presented today for surgery, with the diagnosis of VIN 3  The various methods of treatment have been discussed with the patient and family. After consideration of risks, benefits and other options for treatment, the patient has consented to  Procedure(s): WIDE EXCISION VULVECTOMY (N/A) as a surgical intervention .  The patient's history has been reviewed, patient examined, no change in status, stable for surgery.  I have reviewed the patient's chart and labs.  Questions were answered to the patient's satisfaction.     Donaciano Eva

## 2015-02-19 NOTE — Anesthesia Procedure Notes (Signed)
Procedure Name: LMA Insertion Date/Time: 02/19/2015 9:20 AM Performed by: Bethena Roys T Pre-anesthesia Checklist: Patient identified, Emergency Drugs available, Suction available and Patient being monitored Patient Re-evaluated:Patient Re-evaluated prior to inductionOxygen Delivery Method: Circle System Utilized Preoxygenation: Pre-oxygenation with 100% oxygen Intubation Type: IV induction Ventilation: Mask ventilation without difficulty LMA: LMA inserted LMA Size: 4.0 Number of attempts: 1 Airway Equipment and Method: Bite block Placement Confirmation: positive ETCO2 Tube secured with: Tape Dental Injury: Teeth and Oropharynx as per pre-operative assessment

## 2015-02-19 NOTE — Transfer of Care (Signed)
Immediate Anesthesia Transfer of Care Note  Patient: [name] [name]  Procedure(s) Performed: Procedure(s): SIMPLE PARTIAL VULVECTOMY (N/A)  Patient Location: PACU  Anesthesia Type:General  Level of Consciousness: awake, alert  and oriented  Airway & Oxygen Therapy: Patient Spontanous Breathing and Patient connected to nasal cannula oxygen  Post-op Assessment: Report given to RN  Post vital signs: Reviewed and stable  Last Vitals:  Filed Vitals:   02/19/15 0823  BP: 159/83  Pulse: 88  Temp: 36.7 C  Resp: 16    Complications: No apparent anesthesia complications

## 2015-02-19 NOTE — Anesthesia Preprocedure Evaluation (Signed)
Anesthesia Evaluation  Patient identified by MRN, date of birth, ID band Patient awake    Reviewed: Allergy & Precautions, H&P , NPO status , Patient's Chart, lab work & pertinent test results, reviewed documented beta blocker date and time   Airway Mallampati: II  TM Distance: >3 FB Neck ROM: Full    Dental no notable dental hx. (+) Teeth Intact, Dental Advisory Given   Pulmonary shortness of breath and with exertion, asthma , sleep apnea , COPD COPD inhaler and oxygen dependent, PE sarcoidosis breath sounds clear to auscultation  Pulmonary exam normal       Cardiovascular Exercise Tolerance: Poor hypertension, Pt. on medications and Pt. on home beta blockers + CAD, + Past MI and +CHF + pacemaker Rhythm:Regular Rate:Normal  pulm htn. LBBB. Diastolic and systolic CHF   Neuro/Psych PSYCHIATRIC DISORDERS Anxiety CVA, No Residual Symptoms negative psych ROS   GI/Hepatic negative GI ROS, Neg liver ROS, PUD, GERD-  Medicated and Controlled,  Endo/Other  negative endocrine ROS  Renal/GU negative Renal ROS  negative genitourinary   Musculoskeletal negative musculoskeletal ROS (+)   Abdominal   Peds negative pediatric ROS (+)  Hematology negative hematology ROS (+)   Anesthesia Other Findings   Reproductive/Obstetrics negative OB ROS                             Anesthesia Physical Anesthesia Plan  ASA: III  Anesthesia Plan: General   Post-op Pain Management:    Induction: Intravenous  Airway Management Planned: LMA  Additional Equipment:   Intra-op Plan:   Post-operative Plan:   Informed Consent: I have reviewed the patients History and Physical, chart, labs and discussed the procedure including the risks, benefits and alternatives for the proposed anesthesia with the patient or authorized representative who has indicated his/her understanding and acceptance.   Dental Advisory  Given  Plan Discussed with: CRNA and Surgeon  Anesthesia Plan Comments:         Anesthesia Quick Evaluation

## 2015-02-19 NOTE — Discharge Instructions (Signed)
Vulvectomy, Care After °The vulva is the external female genitalia, outside and around the vagina and pubic bone. It consists of: °· The skin on, and in front of, the pubic bone. °· The clitoris. °· The labia majora (large lips) on the outside of the vagina. °· The labia minora (small lips) around the opening of the vagina. °· The opening and the skin in and around the vagina. °A vulvectomy is the removal of the tissue of the vulva, which sometimes includes removal of the lymph nodes and tissue in the groin areas. °These discharge instructions provide you with general information on caring for yourself after you leave the hospital. It is also important that you know the warning signs of complications, so that you can seek treatment. Please read the instructions outlined below and refer to this sheet in the next few weeks. Your caregiver may also give you specific information and medicines. If you have any questions or complications after discharge, please call your caregiver. °ACTIVITY °· Rest as much as possible the first two weeks after discharge. °· Arrange to have help from family or others with your daily activities when you go home. °· Avoid heavy lifting (more than 5 pounds), pushing, or pulling. °· If you feel tired, balance your activity with rest periods. °· Follow your caregiver's instruction about climbing stairs and driving a car. °· Increase activity gradually. °· Do not exercise until you have permission from your caregiver. °LEG AND FOOT CARE °If your doctor has removed lymph nodes from your groin area, there may be an increase in swelling of your legs and feet. You can help prevent swelling by doing the following: °· Elevate your legs while sitting or lying down. °· If your caregiver has ordered special stockings, wear them according to instructions. °· Avoid standing in one place for long periods of time. °· Call the physical therapy department if you have any questions about swelling or treatment  for swelling. °· Avoid salt in your diet. It can cause fluid retention and swelling. °· Do not cross your legs, especially when sitting. °NUTRITION °· You may resume your normal diet. °· Drink 6 to 8 glasses of fluids a day. °· Eat a healthy, balanced diet including portions of food from the meat (protein), milk, fruit, vegetable, and bread groups. °· Your caregiver may recommend you take a multivitamin with iron. °ELIMINATION °· You may notice that your stream of urine is at a different angle, and may tend to spray. Using a plastic funnel may help to decrease urine spray. °· If constipation occurs, drink more liquids, and add more fruits, vegetables, and bran to your diet. You may take a mild laxative, such as Milk of Magnesia, Metamucil, or a stool softener such as Colace, with permission from your caregiver. °HYGIENE °· You may shower and wash your hair. °· Check with your caregiver about tub baths. °· Do not add any bath oils or chemicals to your bath water, after you have permission to take baths. °· While passing urine, pour water from a bottle or spray over your vulva to dilute the urine as it passes the incision (this will decrease burning and discomfort). °· Clean yourself well after moving your bowels. °· After urinating, do not wipe. Dap or pat dry with toilet paper or a dry cleath soft cloth. °· A sitz bath will help keep your perineal area clean, reduce swelling, and provide comfort. °· Avoid wearing underpants for the first 2 weeks and wear loose skirts to   allow circulation of air around the incision  You do not need to apply dressings, salves or lotions to the wound.  The stitches are self-dissolving and will absorb and disappear over a couple of months (it is normal to notice the knot from the stitches on toilet paper after voiding). HOME CARE INSTRUCTIONS   Apply a soft ice pack (or frozen bag of peas) to your perineum (vulva) every hour in the first 48 hours after surgery. This will reduce  swelling.  Avoid activities that involve a lot of friction between your legs.  Avoid wearing pants or underpants in the 1st 2 weeks (skirts are preferable).  Take your temperature twice a day and record it, especially if you feel feverish or have chills.  Follow your caregiver's instructions about medicines, activity, and follow-up appointments after surgery.  Do not drink alcohol while taking pain medicine.  Change your dressing as advised by your caregiver.  You may take over-the-counter medicine for pain, recommended by your caregiver.  If your pain is not relieved with medicine, call your caregiver.  Do not take aspirin because it can cause bleeding.  Do not douche or use tampons (use a nonperfumed sanitary pad).  Do not have sexual intercourse until your caregiver gives you permission (typically 6 weeks postoperatively). Hugging, kissing, and playful sexual activity is fine with your caregiver's permission.  Warm sitz baths, with your caregiver's permission, are helpful to control swelling and discomfort.  Take showers instead of baths, until your caregiver gives you permission to take baths.  You may take a mild medicine for constipation, recommended by your caregiver. Bran foods and drinking a lot of fluids will help with constipation.  Make sure your family understands everything about your operation and recovery. SEEK MEDICAL CARE IF:   You notice swelling and redness around the wound area.  You notice a foul smell coming from the wound or on the surgical dressing.  You notice the wound is separating.  You have painful or bloody urination.  You develop nausea and vomiting.  You develop diarrhea.  You develop a rash.  You have a reaction or allergy from the medicine.  You feel dizzy or light-headed.  You need stronger pain medicine. SEEK IMMEDIATE MEDICAL CARE IF:   You develop a temperature of 102 F (38.9 C) or higher.  You pass out.  You develop  leg or chest pain.  You develop abdominal pain.  You develop shortness of breath.  You develop bleeding from the wound area.  You see pus in the wound area. MAKE SURE YOU:   Understand these instructions.  Will watch your condition.  Will get help right away if you are not doing well or get worse. Document Released: 07/18/2004 Document Revised: 04/20/2014 Document Reviewed: 11/05/2009 [facility] Patient Information 2015 [place], [place]. This information is not intended to replace advice given to you by your health care provider. Make sure you discuss any questions you have with your health care provider.   Post Anesthesia Home Care Instructions  Activity: Get plenty of rest for the remainder of the day. A responsible adult should stay with you for 24 hours following the procedure.  For the next 24 hours, DO NOT: -Drive a car -Paediatric nurse -Drink alcoholic beverages -Take any medication unless instructed by your physician -Make any legal decisions or sign important papers.  Meals: Start with liquid foods such as gelatin or soup. Progress to regular foods as tolerated. Avoid greasy, spicy, heavy foods. If nausea and/or vomiting  occur, drink only clear liquids until the nausea and/or vomiting subsides. Call your physician if vomiting continues.  Special Instructions/Symptoms: Your throat may feel dry or sore from the anesthesia or the breathing tube placed in your throat during surgery. If this causes discomfort, gargle with warm salt water. The discomfort should disappear within 24 hours.    Post Anesthesia Home Care Instructions  Activity: Get plenty of rest for the remainder of the day. A responsible adult should stay with you for 24 hours following the procedure.  For the next 24 hours, DO NOT: -Drive a car -Paediatric nurse -Drink alcoholic beverages -Take any medication unless instructed by your physician -Make any legal decisions or sign important  papers.  Meals: Start with liquid foods such as gelatin or soup. Progress to regular foods as tolerated. Avoid greasy, spicy, heavy foods. If nausea and/or vomiting occur, drink only clear liquids until the nausea and/or vomiting subsides. Call your physician if vomiting continues.  Special Instructions/Symptoms: Your throat may feel dry or sore from the anesthesia or the breathing tube placed in your throat during surgery. If this causes discomfort, gargle with warm salt water. The discomfort should disappear within 24 hours.

## 2015-02-19 NOTE — Anesthesia Postprocedure Evaluation (Signed)
  Anesthesia Post-op Note  Patient: [name] [name]  Procedure(s) Performed: Procedure(s) (LRB): SIMPLE PARTIAL VULVECTOMY (N/A)  Patient Location: PACU  Anesthesia Type: General  Level of Consciousness: awake and alert   Airway and Oxygen Therapy: Patient Spontanous Breathing  Post-op Pain: mild  Post-op Assessment: Post-op Vital signs reviewed, Patient's Cardiovascular Status Stable, Respiratory Function Stable, Patent Airway and No signs of Nausea or vomiting  Last Vitals:  Filed Vitals:   02/19/15 1045  BP: 148/88  Pulse: 69  Temp:   Resp: 20    Post-op Vital Signs: stable   Complications: No apparent anesthesia complications

## 2015-02-19 NOTE — H&P (View-Only) (Signed)
Consult Note: Gyn-Onc  Consult was requested by Dr. Benjie [name] for the evaluation of [name] [name] 79 y.o. female with VIN3  CC:  Chief Complaint  Patient presents with  . VIN III    Assessment/Plan:  [name]  is a 79 y.o.  year old with recurrent right labial VIN 3. The patient has multiple medical comorbidities including heart valve disease congestive heart failure and history of an MI. She is on Xarelto anticoagulation.  I am recommending an excisional procedure of this lesion with wide local excision of the vulva. I discussed surgical risks including bleeding and infection and wound separation. I discussed postoperative perineal care and means to reduce the likelihood of wound separation. I discussed that she is at increased risk for bleeding, occasions given her Xarelto use. We will contact her cardiologist preoperatively to ascertain the safest time. For her to hold her Xarelto to minimize his bleeding risk. I discussed that this condition of vulvar dysplasia tends to be a recurrent one. She has a 25% risk for recurrence of the lesion after the surgery.   HPI: [name] [name] is a 79 year old woman with a history of VIN-III of the right vulva who is seen in consultation at the request of Dr [name] [name] for [name] [name].  She presented to Dr. Benjie [name] earlier this month in February for symptoms of vulvar pruritus. [name] [name] identified a lesion concerning for VIN on the right labia minora and performed a biopsy of this. Pathology revealed severe squamous dysplasia, carcinoma in situ. There was no invasive malignancy in the specimen.  The patient has a history of a prior right partial simple vulvectomy for the same process several years ago. She does not remember who performed a surgery. Of note her other medical history is particularly significant for a hysterectomy at age 26 for non-oncologic reasons. She also has a history of a myocardial infarction in 2012 history with a stent. She has cardiac valve  disease and heart failure and takes Xarelto. Dr. Synetta [name] is her cardiologist.  Current Meds:  Outpatient Encounter Prescriptions as of 02/10/2015  Medication Sig  . acetaminophen (TYLENOL) 325 MG tablet Take 2 tablets (650 mg total) by mouth every 6 (six) hours as needed.  . cetirizine (ZYRTEC) 10 MG tablet Take 10 mg by mouth daily.   . cholecalciferol (VITAMIN D) 1000 UNITS tablet Take 1,000 Units by mouth daily.  . clorazepate (TRANXENE) 7.5 MG tablet Take 1 tablet (7.5 mg total) by mouth 3 (three) times daily.  [name] Kitchen FLOVENT HFA 44 MCG/ACT inhaler Inhale 2 puffs two times  daily  . fluticasone (FLONASE) 50 MCG/ACT nasal spray 1-2 puffs each nostril once daily  . folic acid (FOLVITE) 408 MCG tablet Take 400 mcg by mouth daily.  . furosemide (LASIX) 20 MG tablet Take 1 tablet (20 mg total) by mouth 2 (two) times daily.  [name] Kitchen ipratropium-albuterol (DUONEB) 0.5-2.5 (3) MG/3ML SOLN Take 3 mLs by nebulization 2 (two) times daily.  . metoprolol succinate (TOPROL-XL) 25 MG 24 hr tablet Take 1 tablet (25 mg total) by mouth daily before breakfast.  . Multiple Vitamin (MULTIVITAMIN) tablet Take 1 tablet by mouth daily.  [name] Kitchen nystatin-triamcinolone (MYCOLOG II) cream Apply 1 application topically at bedtime.   . pantoprazole (PROTONIX) 40 MG tablet Take 1 tablet (40 mg total) by mouth 2 (two) times daily.  [name] [name] Glycol-Propyl Glycol (SYSTANE) 0.4-0.3 % SOLN Place 2 drops into both eyes 4 (four) times daily.   . pravastatin (PRAVACHOL) 40 MG tablet Take  1 tablet (40 mg total) by mouth at bedtime.  . sennosides-docusate sodium (SENOKOT-S) 8.6-50 MG tablet Take 2 tablets by mouth at bedtime.   . silver sulfADIAZINE (SILVADENE) 1 % cream Apply 1 application topically 2 (two) times daily.   Alveda Reasons 20 MG TABS tablet Take 1 tablet by mouth  daily  . Azelastine HCl 0.15 % SOLN 1-2 puffs each nostril once daily (Patient not taking: Reported on 02/10/2015)  . [DISCONTINUED] Multiple Vitamins-Minerals  (PROTEGRA CARDIO PO) Take 1 tablet by mouth daily.    Allergy:  Allergies  Allergen Reactions  . Dymista [Azelastine-Fluticasone] Other (See Comments)    Headaches  . Sulfonamide Derivatives Nausea Only  . Imitrex [Sumatriptan] Other (See Comments)    Reaction unknown  . Sulfa Antibiotics Other (See Comments)    Doesn't like to drink lots of water.    Social Hx:   History   Social History  . Marital Status: Widowed    Spouse Name: N/A  . Number of Children: 4  . Years of Education: N/A   Occupational History  . retired    Social History Main Topics  . Smoking status: Never Smoker   . Smokeless tobacco: Never Used  . Alcohol Use: No  . Drug Use: No  . Sexual Activity: No   Other Topics Concern  . Not on file   Social History Narrative    Past Surgical Hx:  Past Surgical History  Procedure Laterality Date  . Appendectomy    . Cholecystectomy  01/1996    Dr. Janan Halter  . Abdominal hysterectomy    . Lumbar disc surgery  1990    X2  . Left knee arthroscopy    . Ivc filter placed  07/2010  . Pacemaker insertion  2008  . Vulvar lesion removal  07/16/2012    Procedure: VULVAR LESION;  Surgeon: Imagene Gurney A. Alycia Rossetti, MD;  Location: [facility];  Service: Gynecology;  Laterality: Right;  Wide Local Excision of the Vulvar  . Total knee arthroplasty Left 04/21/2013    Procedure: LEFT TOTAL KNEE ARTHROPLASTY;  Surgeon: Gearlean Alf, MD;  Location: [facility];  Service: Orthopedics;  Laterality: Left;    Past Medical Hx:  Past Medical History  Diagnosis Date  . Allergic rhinitis   . Sarcoidosis   . Pulmonary hypertension   . Asthmatic bronchitis   . Pulmonary embolism   . Hypertension   . CAD (coronary artery disease)   . Cardiac pacemaker in situ   . Venous insufficiency   . Hyperlipidemia   . Obesity   . PUD (peptic ulcer disease)   . Hemorrhoids   . Renal insufficiency   . UTI (lower urinary tract infection)   . DJD (degenerative joint disease)   . Cervicalgia   .  Lumbar back pain   . Anxiety   . Myocardial infarction 2012  . CHF (congestive heart failure) 2012  . COPD (chronic obstructive pulmonary disease)   . Shortness of breath     OCCASIONAL  . GERD (gastroesophageal reflux disease)   . Cancer     VULVAR CANCER  . Pacemaker   . Stroke 2006    balance problems remain  . OSA (obstructive sleep apnea)     USES O2 AT HOME WITH NASAL MASK  . Gallstones   . ALLERGIC RHINITIS 12/10/2007    Qualifier: Diagnosis of  By: Lenna Gilford MD, [place] 09/30/2007    Qualifier: Diagnosis of  By: Rosana Hoes CMA, Tammy    .  ASTHMATIC BRONCHITIS, ACUTE 09/30/2007    Qualifier: History of  By: Rosana Hoes CMA, Tammy    . BACK PAIN, LUMBAR 09/30/2007    Qualifier: Diagnosis of  By: Rosana Hoes CMA, Tammy    . CIN III (cervical intraepithelial neoplasia III) 05/28/2012  . CORONARY ARTERY DISEASE 09/05/2008    Qualifier: Diagnosis of  By: Lenna Gilford MD, [place], LEG, RIGHT 08/29/2010    Qualifier: Diagnosis of  By: Royal Piedra NP, Tammy    . DEGENERATIVE JOINT DISEASE 01/05/2009    Qualifier: Diagnosis of  By: Lenna Gilford MD, [place] 09/30/2007    Qualifier: Diagnosis of  By: Rosana Hoes CMA, [place]    . HYPERLIPIDEMIA 09/05/2008    Qualifier: Diagnosis of  By: Lenna Gilford MD, Deborra Medina   . HYPERTENSION 12/10/2007    Qualifier: Diagnosis of  By: Lenna Gilford MD, Deborra Medina   . OA (osteoarthritis) of knee 04/21/2013  . OBESITY 12/10/2007    Qualifier: Diagnosis of  By: Lenna Gilford MD, [place] 07/11/2009    Qualifier: Diagnosis of  By: Lenna Gilford MD, Deborra Medina   . Postop Hypokalemia 04/22/2013  . Postoperative anemia due to acute blood loss 04/22/2013  . PUD 12/10/2007    Qualifier: History of  By: Lenna Gilford MD, Deborra Medina   . PULMONARY EMBOLISM 07/22/2010    Qualifier: Diagnosis of  By: Lenna Gilford MD, Deborra Medina   . PULMONARY HYPERTENSION 09/30/2007    Qualifier: Diagnosis of  By: Rosana Hoes CMA, Tammy    . RENAL INSUFFICIENCY 08/17/2010    Qualifier: Diagnosis of  By:  Lenna Gilford MD, [place] 09/30/2007    Qualifier: History of  By: Rosana Hoes CMA, Tammy    . SYNCOPE 12/11/2007    Qualifier: History of  By: Lenna Gilford MD, Deborra Medina   . UTI 08/17/2010    Qualifier: Diagnosis of  By: Lenna Gilford MD, Deborra Medina   . VENOUS INSUFFICIENCY 12/10/2007    Qualifier: Diagnosis of  By: Lenna Gilford MD, Deborra Medina   . Impaired glucose tolerance 04/29/2014    Past Gynecological History:  Hx of VIN3, hx of hysterectomy for benign condition, hx of SVD.  No LMP recorded. Patient is postmenopausal.  Family Hx:  Family History  Problem Relation Age of Onset  . Heart attack Father   . Breast cancer Sister     mets  . Heart attack Brother   . Kidney failure Sister     Review of Systems:  Constitutional  Feels well,    ENT Normal appearing ears and nares bilaterally Skin/Breast  No rash, sores, jaundice, itching, dryness Cardiovascular  No chest pain, shortness of breath, or edema  Pulmonary  No cough or wheeze.  Gastro Intestinal  No nausea, vomitting, or diarrhoea. No bright red blood per rectum, no abdominal pain, change in bowel movement, or constipation.  Genito Urinary  No frequency, urgency, dysuria, + vulvar pruritis. Musculo Skeletal  No myalgia, arthralgia, joint swelling or pain  Neurologic  No weakness, numbness, change in gait,  Psychology  No depression, anxiety, insomnia.   Vitals:  Blood pressure 155/79, pulse 79, temperature 97.9 F (36.6 C), temperature source Oral, resp. rate 18, height 5' 4.5" (1.638 m), weight 181 lb 3.2 oz (82.192 kg).  Physical Exam: WD in NAD Neck  Supple NROM, without any enlargements.  Lymph Node Survey No cervical supraclavicular or inguinal adenopathy Cardiovascular  Pulse normal rate, regularity and rhythm. S1 and S2 normal.  Lungs  Clear to auscultation bilateraly, without wheezes/crackles/rhonchi. Good air movement.  Skin  No rash/lesions/breakdown  Psychiatry  Alert and oriented to person, place, and time  Abdomen   Normoactive bowel sounds, abdomen soft, non-tender and overweight without evidence of hernia.  Back No CVA tenderness Genito Urinary  Vulva/vagina: horseshoe shaped lesion on the right labia minora, extending across perineal body to left perineal body, extending into distal vagina. Slightly raised area on the right lateral edge. Most consistent with VIN3. >2cm from urethra.  Bladder/urethra:  No lesions or masses, well supported bladder  Vagina: see above.  Cervix: surgically absent  Uterus: surgically absent  Adnexa:  No masses. Rectal  Good tone, no masses no cul de sac nodularity.  Extremities  No bilateral cyanosis, clubbing or edema.   Donaciano Eva, MD   02/10/2015, 2:26 PM

## 2015-02-19 NOTE — Op Note (Signed)
OPERATIVE NOTE  PATIENT: [name] [name] DATE: 02/19/15   Preop Diagnosis: VIN3  Postoperative Diagnosis: same  Surgery: Partial simple simple partial posterior vulvectomy  Surgeons:  Donaciano Eva, MD Assistant: none  Anesthesia: General   Estimated blood loss: 138ml  IVF:  214ml   Urine output: 50 ml   Complications: None   Pathology: posterior vulva and distal posterior vagina with marking stitch at 12 o'clock/midline vaginal margin (long) and right anterior vulvar margin (short).  Operative findings: erythematous, slightly raised plaque occupying perineal body, right labia minora and distal 1cm of vagina beyond entroitus.  Procedure: The patient was identified in the preoperative holding area. Informed consent was signed on the chart. Patient was seen history was reviewed and exam was performed.   The patient was then taken to the operating room and placed in the supine position with SCD hose on. General anesthesia was then induced without difficulty. She was then placed in the dorsolithotomy position. The perineum was prepped with Betadine. The vagina was prepped with Betadine. The patient was then draped after the prep was dried. A Foley catheter was inserted into the bladder under sterile conditions.  Timeout was performed the patient, procedure, antibiotic, allergy, and length of procedure. 5% acetic acid solution was applied to the perineum. The vulvar tissues were inspected for areas of acetowhite changes or leukoplakia. The lesion was identified and the marking pen was used to circumscribe the area with appropriate surgical margins. The subcuticular tissues were infiltrated with 1% lidocaine. The 15 blade scalpel was used to make an incision through the skin circumferentially as marked. The skin elipse was grasped and was separated from the underlying deep dermal tissues with the bovie device. After the specimen had been completely resected, it was oriented  and marked at long suture at 12 o'clock of the midline vaginal margin and a short suture at the anterior right vulvar margin with a 0-vicryl suture. The bovie was used to obtain hemostasis at the surgical bed. The subcutaneous tissues were irrigated and made hemostatic.   The deep dermal layer was approximated with 3-0vicryl mattress sutures to bring the skin edges into approximation and off tension. The wound was closed following langher's lines. The cutaneous layer was closed with interrupted 4-0 vicryl stitches and mattress sutures to ensure a tension free and hemostatic closure. The perineum was again irrigated. The foley was removed.  All instrument, suture, laparotomy, Ray-Tec, and needle counts were correct x2. The patient tolerated the procedure well and was taken recovery room in stable condition. This is [name] dictating an operative note on [name].  Donaciano Eva, MD

## 2015-02-22 ENCOUNTER — Encounter (HOSPITAL_BASED_OUTPATIENT_CLINIC_OR_DEPARTMENT_OTHER): Payer: Self-pay | Admitting: Gynecologic Oncology

## 2015-02-22 NOTE — Telephone Encounter (Signed)
Informed patient that her pathology revealed VIN3. Recommend 6 monthly followup. Donaciano Eva, MD

## 2015-03-03 ENCOUNTER — Other Ambulatory Visit: Payer: Self-pay

## 2015-03-03 MED ORDER — CLORAZEPATE DIPOTASSIUM 7.5 MG PO TABS: 7.5000 mg | ORAL_TABLET | Freq: Three times a day (TID) | ORAL | Status: DC

## 2015-03-04 ENCOUNTER — Ambulatory Visit (INDEPENDENT_AMBULATORY_CARE_PROVIDER_SITE_OTHER): Payer: Medicare Other | Admitting: *Deleted

## 2015-03-04 DIAGNOSIS — I442 Atrioventricular block, complete: Secondary | ICD-10-CM

## 2015-03-04 LAB — MDC_IDC_ENUM_SESS_TYPE_REMOTE
Battery Impedance: 2141 Ohm
Battery Voltage: 2.74 V
Brady Statistic AP VP Percent: 16 %
Brady Statistic AS VP Percent: 84 %
Brady Statistic AS VS Percent: 0 %
Date Time Interrogation Session: 20160317113311
Lead Channel Impedance Value: 367 Ohm
Lead Channel Impedance Value: 501 Ohm
Lead Channel Pacing Threshold Amplitude: 1.25 V
Lead Channel Pacing Threshold Pulse Width: 0.4 ms
Lead Channel Sensing Intrinsic Amplitude: 2.8 mV
Lead Channel Setting Sensing Sensitivity: 2.8 mV
MDC IDC MSMT BATTERY REMAINING LONGEVITY: 27 mo
MDC IDC MSMT LEADCHNL RA PACING THRESHOLD AMPLITUDE: 0.5 V
MDC IDC MSMT LEADCHNL RV PACING THRESHOLD PULSEWIDTH: 0.4 ms
MDC IDC SET LEADCHNL RA PACING AMPLITUDE: 2 V
MDC IDC SET LEADCHNL RV PACING AMPLITUDE: 2.5 V
MDC IDC SET LEADCHNL RV PACING PULSEWIDTH: 0.4 ms
MDC IDC STAT BRADY AP VS PERCENT: 0 %

## 2015-03-04 NOTE — Progress Notes (Signed)
Remote pacemaker transmission.   

## 2015-03-08 ENCOUNTER — Other Ambulatory Visit: Payer: Self-pay | Admitting: *Deleted

## 2015-03-09 MED ORDER — CLORAZEPATE DIPOTASSIUM 7.5 MG PO TABS: 7.5000 mg | ORAL_TABLET | Freq: Three times a day (TID) | ORAL | Status: DC

## 2015-03-09 NOTE — Telephone Encounter (Signed)
Done hardcopy to Cherina  

## 2015-03-09 NOTE — Telephone Encounter (Signed)
Received fax pt wanting refill on clorazepate...Johny Chess

## 2015-03-10 ENCOUNTER — Other Ambulatory Visit: Payer: Self-pay | Admitting: *Deleted

## 2015-03-10 DIAGNOSIS — D869 Sarcoidosis, unspecified: Secondary | ICD-10-CM

## 2015-03-10 NOTE — Telephone Encounter (Signed)
Done

## 2015-03-11 ENCOUNTER — Encounter: Payer: Self-pay | Admitting: Hematology & Oncology

## 2015-03-11 ENCOUNTER — Encounter: Payer: Self-pay | Admitting: Cardiology

## 2015-03-11 ENCOUNTER — Ambulatory Visit (HOSPITAL_BASED_OUTPATIENT_CLINIC_OR_DEPARTMENT_OTHER): Payer: Medicare Other | Admitting: Hematology & Oncology

## 2015-03-11 ENCOUNTER — Other Ambulatory Visit (HOSPITAL_BASED_OUTPATIENT_CLINIC_OR_DEPARTMENT_OTHER): Payer: Medicare Other

## 2015-03-11 VITALS — BP 153/75 | HR 88 | Temp 97.9°F | Resp 16 | Ht 64.0 in | Wt 186.0 lb

## 2015-03-11 DIAGNOSIS — D071 Carcinoma in situ of vulva: Secondary | ICD-10-CM | POA: Diagnosis not present

## 2015-03-11 DIAGNOSIS — I82401 Acute embolism and thrombosis of unspecified deep veins of right lower extremity: Secondary | ICD-10-CM | POA: Diagnosis not present

## 2015-03-11 DIAGNOSIS — D869 Sarcoidosis, unspecified: Secondary | ICD-10-CM

## 2015-03-11 LAB — CBC WITH DIFFERENTIAL (CANCER CENTER ONLY)
BASO#: 0 10*3/uL (ref 0.0–0.2)
BASO%: 0.5 % (ref 0.0–2.0)
EOS ABS: 0.2 10*3/uL (ref 0.0–0.5)
EOS%: 2.3 % (ref 0.0–7.0)
HCT: 37.3 % (ref 34.8–46.6)
HEMOGLOBIN: 11.9 g/dL (ref 11.6–15.9)
LYMPH#: 1.2 10*3/uL (ref 0.9–3.3)
LYMPH%: 18.4 % (ref 14.0–48.0)
MCH: 29.9 pg (ref 26.0–34.0)
MCHC: 31.9 g/dL — ABNORMAL LOW (ref 32.0–36.0)
MCV: 94 fL (ref 81–101)
MONO#: 0.9 10*3/uL (ref 0.1–0.9)
MONO%: 13.1 % — ABNORMAL HIGH (ref 0.0–13.0)
NEUT%: 65.7 % (ref 39.6–80.0)
NEUTROS ABS: 4.3 10*3/uL (ref 1.5–6.5)
PLATELETS: 209 10*3/uL (ref 145–400)
RBC: 3.98 10*6/uL (ref 3.70–5.32)
RDW: 13.8 % (ref 11.1–15.7)
WBC: 6.6 10*3/uL (ref 3.9–10.0)

## 2015-03-11 LAB — D-DIMER, QUANTITATIVE (NOT AT ARMC): D DIMER QUANT: 2.25 ug{FEU}/mL — AB (ref 0.00–0.48)

## 2015-03-11 NOTE — Progress Notes (Signed)
Hematology and Oncology Follow Up Visit  [name] [name] [id number] 1936-08-14 79 y.o. 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 His been 2 years since we saw her. She recently has had some gynecologic surgery. She had excision of a vulvar lesion. This was done on March 4. The pathology report ([id number]) showed that she had high-grade vulvar intraepithelial neoplasia. It focally involve the 8 margin. No invasive component was noted.  She sees Dr. Denman George tomorrow for follow-up.  She's had no problems with the blood clot. She's been getting around with a cane. She has had no issues with nausea or vomiting. She's had no bleeding. She's had no leg swelling. She does wear compression hose on the right leg.  She's had no cough. There's been no shortness of breath. She's had no pain area and she's had no headache.  Overall, her performance status is ECOG 2.    Medications:  Current outpatient prescriptions:  .  acetaminophen (TYLENOL) 325 MG tablet, Take 2 tablets (650 mg total) by mouth every 6 (six) hours as needed., Disp: 60 tablet, Rfl: 0 .  Azelastine HCl 0.15 % SOLN, Place 1 spray into the nose 2 (two) times daily., Disp: , Rfl:  .  cetirizine (ZYRTEC) 10 MG tablet, Take 10 mg by mouth every morning. , Disp: , Rfl:  .  cholecalciferol (VITAMIN D) 1000 UNITS tablet, Take 1,000 Units by mouth every morning. , Disp: , Rfl:  .  clorazepate (TRANXENE) 7.5 MG tablet, Take 1 tablet (7.5 mg total) by mouth 3 (three) times daily., Disp: 90 tablet, Rfl: 3 .  FLOVENT HFA 44 MCG/ACT inhaler, Inhale 2 puffs two times  daily, Disp: 31.8 g, Rfl: 6 .  fluticasone (FLONASE) 50 MCG/ACT nasal spray, 1-2 puffs each nostril once daily (Patient taking differently: Place 1 spray into both nostrils every  morning. 1-2 puffs each nostril once daily), Disp: 48 g, Rfl: 3 .  folic acid (FOLVITE) 937 MCG tablet, Take 400 mcg by mouth every morning., Disp: , Rfl:  .  furosemide (LASIX) 20 MG tablet, Take 1 tablet (20 mg total) by mouth 2 (two) times daily. (Patient taking differently: Take 40 mg by mouth daily. ), Disp: 180 tablet, Rfl: 3 .  ibuprofen (ADVIL,MOTRIN) 800 MG tablet, Take 1 tablet (800 mg total) by mouth every 8 (eight) hours as needed., Disp: 30 tablet, Rfl: 0 .  ipratropium-albuterol (DUONEB) 0.5-2.5 (3) MG/3ML SOLN, Take 3 mLs by nebulization 2 (two) times daily., Disp: 180 mL, Rfl: 5 .  metoprolol succinate (TOPROL-XL) 25 MG 24 hr tablet, Take 1 tablet (25 mg total) by mouth daily before breakfast., Disp: 90 tablet, Rfl: 3 .  Multiple Vitamin (MULTIVITAMIN) tablet, Take 1 tablet by mouth daily., Disp: , Rfl:  .  pantoprazole (PROTONIX) 40 MG tablet, Take 1 tablet (40 mg total) by mouth 2 (two) times daily., Disp: 180 tablet, Rfl: 3 .  Polyethyl Glycol-Propyl Glycol (SYSTANE) 0.4-0.3 % SOLN, Place 2 drops into both eyes 4 (four) times daily. , Disp: , Rfl:  .  pravastatin (PRAVACHOL) 40 MG tablet, Take 1 tablet (40 mg total) by mouth at bedtime., Disp: 90 tablet, Rfl: 3 .  sennosides-docusate sodium (SENOKOT-S) 8.6-50 MG tablet, Take 2 tablets by mouth at bedtime. , Disp: , Rfl:  .  silver sulfADIAZINE (SILVADENE) 1 % cream, Apply 1 application topically 2 (two) times daily as needed. , Disp: , Rfl: 1 .  XARELTO 20 MG TABS tablet, Take 1 tablet by mouth  daily, Disp: 90 tablet, Rfl: 2  Allergies:  Allergies  Allergen Reactions  . Imitrex [Sumatriptan] Other (See Comments)    Reaction unknown  . Sulfa Antibiotics Nausea Only    Doesn't like to drink lots of water.    Past Medical History, Surgical history, Social history, and Family History were reviewed and updated.  Review of Systems: As above  Physical Exam:  height is 5\' 4"  (1.626 m) and weight is 186 lb (84.369 kg). Her  oral temperature is 97.9 F (36.6 C). Her blood pressure is 153/75 and her pulse is 88. Her respiration is 16.   Wt Readings from Last 3 Encounters:  03/11/15 186 lb (84.369 kg)  02/19/15 176 lb 8 oz (80.06 kg)  02/10/15 181 lb 3.2 oz (82.192 kg)     Elderly white female in no obvious distress. Head and neck exam shows no ocular or oral lesions. There are no palpable cervical or supraclavicular lymph nodes. Lungs are clear. Cardiac exam regular rate and rhythm with no murmurs, rubs or bruits. Abdomen is soft. She has good bowel sounds. There is no fluid wave. There is no palpable liver or spleen tip. Back exam shows no tenderness over the spine, ribs or hips. Extremities shows compression stocking on the right leg. Maybe some chronic mild nonpitting edema of the right leg. No venous cord is noted. Skin exam shows some scattered small ecchymoses. Neurological exam shows no focal neurological deficits.  Lab Results  Component Value Date   WBC 6.6 03/11/2015   HGB 11.9 03/11/2015   HCT 37.3 03/11/2015   MCV 94 03/11/2015   PLT 209 03/11/2015     Chemistry      Component Value Date/Time   NA 145 02/19/2015 0900   K 3.6 02/19/2015 0900   CL 103 10/30/2014 1214   CO2 30 10/30/2014 1214   BUN 17 10/30/2014 1214   CREATININE 1.0 10/30/2014 1214      Component Value Date/Time   CALCIUM 9.5 10/30/2014 1214   ALKPHOS 53 10/30/2014 1214   AST 25 10/30/2014 1214   ALT 13 10/30/2014 1214   BILITOT 0.9 10/30/2014 1214         Impression and Plan: [name] [name] is 79 year old white female. She has history of recurrent thromboembolic disease of the right leg. She is on lifelong Xarelto from my point of view.  I don't see anything that we have to worry about right now. I would think that she would eat anything else done for this vulvar lesion. Again she will follow-up with gynecology for this.  At this point, I don't think we have to see her back in the office. She is a lot of fun to talk  to. She brought [place] in a bucket of candy for Easter so that we could give out to our patients.   Volanda Napoleon, MD 3/24/20161:17 PM

## 2015-03-12 ENCOUNTER — Encounter: Payer: Self-pay | Admitting: Gynecologic Oncology

## 2015-03-12 ENCOUNTER — Ambulatory Visit: Payer: Medicare Other | Attending: Gynecologic Oncology | Admitting: Gynecologic Oncology

## 2015-03-12 VITALS — BP 148/86 | HR 74 | Temp 98.3°F | Resp 16

## 2015-03-12 DIAGNOSIS — D071 Carcinoma in situ of vulva: Secondary | ICD-10-CM | POA: Diagnosis not present

## 2015-03-12 DIAGNOSIS — Z09 Encounter for follow-up examination after completed treatment for conditions other than malignant neoplasm: Secondary | ICD-10-CM | POA: Diagnosis present

## 2015-03-12 NOTE — Progress Notes (Signed)
POSTOP FOLLOWUP VISIT  HPI:  [name] [name] is a 79 y.o. year old No obstetric history on file. initially seen in consultation on 02/10/15, referred by Dr Benjie Karvonen, for [facility].  She then underwent a partial simple posterior vulvectomy and distal vaginectomy on 03/25/53 without complications.  Her postoperative course was uncomplicated.  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  She is seen today for a postoperative check and to discuss her pathology results and ongoing plan.  Since discharge from the hospital, she is feeling overall well. She has discomfort at the incision but no drainage. She is using sitz baths.  She has improving appetite, normal bowel and bladder function, and pain controlled with minimal PO medication. She has no other complaints today.    Review of systems: Constitutional:  She has no weight gain or weight loss. She has no fever or chills. Eyes: No blurred vision Ears, Nose, Mouth, Throat: No dizziness, headaches or changes in hearing. No mouth sores. Cardiovascular: No chest pain, palpitations or edema. Respiratory:  No shortness of breath, wheezing or cough Gastrointestinal: She has normal bowel movements without diarrhea or constipation. She denies any nausea or vomiting. She denies blood in her stool or heart burn. Genitourinary:  She denies pelvic pain, pelvic pressure or changes in her urinary function. She has no hematuria, dysuria, or incontinence. She has no irregular vaginal bleeding or vaginal discharge Musculoskeletal: Denies muscle weakness or joint pains.  Skin:  She has no skin changes, rashes or itching Neurological:  Denies dizziness or headaches. No neuropathy, no numbness or tingling. Psychiatric:  She denies depression or anxiety. Hematologic/Lymphatic:   No easy bruising or bleeding   Physical Exam: Blood pressure 148/86, pulse 74, temperature 98.3 F (36.8 C), temperature source Oral, resp. rate 16. General: Well  dressed, well nourished in no apparent distress.   HEENT:  Normocephalic and atraumatic, no lesions.  Extraocular muscles intact. Sclerae anicteric. Pupils equal, round, reactive. No mouth sores or ulcers. Thyroid is normal size, not nodular, midline. Skin:  No lesions or rashes. Breasts: deferred. Lungs: deferred Cardiovascular: deferred Abdomen:  deferred  Genitourinary: vulvo-vaginal incision healing well, intact. No drainage or suppuration. No infection. Extremities: No cyanosis, clubbing or edema.  No calf tenderness or erythema. No palpable cords. Psychiatric: Mood and affect are appropriate. Neurological: Awake, alert and oriented x 3. Sensation is intact, no neuropathy.  Musculoskeletal: No pain, normal strength and range of motion.  Assessment:    79 y.o. year old with VIN III.   S/p distal posterior vaginectomy and partial simple posterior vulvectomy on 02/19/15. The vaginal margin was focally microscopically positive.  Plan: 1) Pathology reports reviewed today 2) Treatment counseling - discussed the high risk for recurrence with VIN III particularly with a positive margin. Recommend surveillance checks with acetic acid application to vulva every 6 months. Recommend patient self inspection for new lesions with mirror every month. She was given the opportunity to ask questions, which were answered to her satisfaction, and she is agreement with the above mentioned plan of care.  3)  Return to clinic 6 months.  Donaciano Eva, MD

## 2015-03-12 NOTE — Patient Instructions (Signed)
Return to see Dr. Denman George in 6 months. Call us in May to make that appt. Please call us sooner with any questions or concerns.

## 2015-03-19 ENCOUNTER — Encounter: Payer: Self-pay | Admitting: Internal Medicine

## 2015-03-25 ENCOUNTER — Other Ambulatory Visit: Payer: Self-pay

## 2015-03-25 MED ORDER — FLUTICASONE PROPIONATE HFA 44 MCG/ACT IN AERO: INHALATION_SPRAY | RESPIRATORY_TRACT | Status: DC

## 2015-03-25 NOTE — Telephone Encounter (Signed)
Received a fax from [facility] requesting a refill on the Flovent HFA AER.

## 2015-03-31 ENCOUNTER — Ambulatory Visit (INDEPENDENT_AMBULATORY_CARE_PROVIDER_SITE_OTHER): Payer: Medicare Other | Admitting: Internal Medicine

## 2015-03-31 ENCOUNTER — Encounter: Payer: Self-pay | Admitting: Internal Medicine

## 2015-03-31 VITALS — BP 122/78 | HR 82 | Ht 64.5 in | Wt 181.4 lb

## 2015-03-31 DIAGNOSIS — G4733 Obstructive sleep apnea (adult) (pediatric): Secondary | ICD-10-CM | POA: Diagnosis not present

## 2015-03-31 DIAGNOSIS — J4520 Mild intermittent asthma, uncomplicated: Secondary | ICD-10-CM

## 2015-03-31 DIAGNOSIS — J310 Chronic rhinitis: Secondary | ICD-10-CM | POA: Diagnosis not present

## 2015-03-31 DIAGNOSIS — IMO0001 Reserved for inherently not codable concepts without codable children: Secondary | ICD-10-CM

## 2015-03-31 MED ORDER — MONTELUKAST SODIUM 10 MG PO TABS: ORAL_TABLET | ORAL | Status: DC

## 2015-03-31 NOTE — Progress Notes (Signed)
06/09[birth year]14- 86 yof never smoker referred courtesy of Dr Blair Heys for allergy evaluation.  Hx OSA- Dr Gwenette Greet. Dr Lenna Gilford has seen for pulmonary- hx sarcoid, DVT/PE, CAD/ Pacemaker, asthmatic bronchitis, allergic rhinitis, cancer cervix Patient complains of sinus congestion and morning rhinorrhea x30 years or more. Triggers include spring and fall pollens, weather changes. Blames drainage for hoarseness and sneezing. Wears oxygen 2.5 L at night with humidifier on her oxygen. Skin testing 10 years ago with allergy shots reported helpful x2 years. Now in the past 3-5 years she has had more problems with nasal pressure, congestion, sneezing and drainage. Chest tightness and dyspnea managed with nebulizer twice daily, Flovent 44. She says she has not been told she has "asthma". We briefly reviewed her complicated medical history. No ENT surgery. Family history-allergies, asthma, heart disease, clotting disorders. She lives alone, retired from office work. Does not recognize significant exposures in her home.  07/29/14- 15 yoF never smoker followed for allergic rhinitis, hx sarcoid, DVT/PE, CAD/ Pacemaker, Hx OSA- Dr Gwenette Greet.  hx sarcoid, DVT/PE, CAD/ Pacemaker, Wears oxygen 2.5 L at night with humidifier on her oxygen/ nasal mask Weather change blamed for hacking cough. Nasal O2  2.5/ L/ [employer]/ sleep-at night dries nose, made worse by Dymista. Liked how Dymista stopped post nasal drip and reduced cough. Magic mouthwash helps throat irritation and thus helps cough.  Allergy profile 06/09/14- Total IgE 3.4- negative for specific or total IgE elevations against common environmental allergens.  09/30/14-77 yoF never smoker followed for allergic rhinitis, hx sarcoid, DVT/PE, CAD/ Pacemaker, Hx OSA- Dr Gwenette Greet.  hx sarcoid, DVT/PE, CAD/ Pacemaker, Wears oxygen 2.5 L at night with humidifier on her oxygen/ nasal mask FOLLOW FOR: allergies, dry cough, SOB, Back pain today  11/16/14- 77 yoF never smoker followed for  allergic rhinitis, hx sarcoid, DVT/PE, CAD/ Pacemaker,Hx OSA- Dr Gwenette Greet. Wears oxygen 2.5 L/ [employer] at night with humidifier on her oxygen/ nasal mask ACUTE VISIT: cough, chest tightness in left side of chest(almost like a pulled muscle), SOB and wheezing. Denies any fevers. Has been around son that is sick as well. Caught cold from son. Sore L parasternal area, radiating through to scapula. Tender to touch. Not at pacemaker site.No fever or green.  03/31/15- 78 yoF never smoker followed for allergic rhinitis, hx sarcoid, DVT/PE, CAD/ Pacemaker,Hx OSA- Dr Gwenette Greet.   oxygen 2.5 L/ [employer] at night FOLLOWS FOR: Itchy, watery eyes, cough and congestion at this time. Dr Shanon Rosser Opth treating recurrent styes. Blames pollen rhinitis for sneezing treated with Flonase and azelastine nasal sprays. Continues home oxygen 2.5 L for sleep  ROS-see HPI Constitutional:   No-   weight loss, night sweats, fevers, chills, fatigue, lassitude. HEENT:   No-  headaches, difficulty swallowing, tooth/dental problems, sore throat,      + sneezing, itching, ear ache, +nasal congestion, +post nasal drip,  CV:  +chest pain, No-orthopnea, PND, swelling in lower extremities, anasarca,                                  dizziness, palpitations Resp: + shortness of breath with exertion or at rest.              No-   productive cough,  + non-productive cough,  No- coughing up of blood.              No-   change in color of mucus.  + wheezing.   Skin: No-  rash or lesions. GI:  No-   heartburn, indigestion, abdominal pain, nausea, vomiting,  GU:  MS:  No-   joint pain or swelling.  . Neuro-     nothing unusual Psych:  No- change in mood or affect. No depression or anxiety.  No memory loss.  OBJ- Physical Exam General- Alert, Oriented, Affect-appropriate, Distress- none acute Skin- rash-none, lesions- none, excoriation- none Lymphadenopathy- none Head- atraumatic            Eyes- Gross vision intact, PERRLA, conjunctivae and  secretions clear, +stye R            Ears- Hearing, canals-normal            Nose- Clear, +Septal dev/ Narrow on the left, no-mucus, polyps, erosion, perforation             Throat- Mallampati II , mucosa clear , drainage- none, tonsils- atrophic Neck- flexible , trachea midline, no stridor , thyroid nl, carotid no bruit Chest - symmetrical excursion , unlabored           Heart/CV- RRR , no murmur , no gallop  , no rub, nl s1 s2                           - JVD- none , edema- none, stasis changes- none, varices- none           Lung- clear to P&A, wheeze- none, cough-+mild , dullness-none, rub- none           Chest wall- +L pacer,  Abd-  Br/ Gen/ Rectal- Not done, not indicated Extrem- cyanosis- none, clubbing, none, atrophy- none, strength- nl, + support stockings Neuro- grossly intact to observation

## 2015-03-31 NOTE — Patient Instructions (Signed)
We are adding Singulair to try 1 daily    If it helps your allergies we can continue. If it doesn't help after the trial then we will stop it.

## 2015-04-18 NOTE — Assessment & Plan Note (Signed)
Good control recently. Adding Singulair might provide some additional stability

## 2015-04-18 NOTE — Assessment & Plan Note (Signed)
Fair control using Flonase plus Astelin because Dymista was not covered by insurance Plan-try adding Singulair

## 2015-04-18 NOTE — Assessment & Plan Note (Signed)
Treated with encouragement to sleep off flat of back, and with oxygen 2 L/[employer] for sleep

## 2015-05-04 ENCOUNTER — Ambulatory Visit (INDEPENDENT_AMBULATORY_CARE_PROVIDER_SITE_OTHER): Payer: Medicare Other | Admitting: Internal Medicine

## 2015-05-04 ENCOUNTER — Encounter: Payer: Self-pay | Admitting: Internal Medicine

## 2015-05-04 ENCOUNTER — Other Ambulatory Visit (INDEPENDENT_AMBULATORY_CARE_PROVIDER_SITE_OTHER): Payer: Medicare Other

## 2015-05-04 VITALS — BP 118/82 | HR 83 | Temp 98.1°F | Wt 180.0 lb

## 2015-05-04 DIAGNOSIS — Z0000 Encounter for general adult medical examination without abnormal findings: Secondary | ICD-10-CM

## 2015-05-04 DIAGNOSIS — E785 Hyperlipidemia, unspecified: Secondary | ICD-10-CM

## 2015-05-04 DIAGNOSIS — R7302 Impaired glucose tolerance (oral): Secondary | ICD-10-CM | POA: Diagnosis not present

## 2015-05-04 DIAGNOSIS — Z0189 Encounter for other specified special examinations: Secondary | ICD-10-CM | POA: Diagnosis not present

## 2015-05-04 DIAGNOSIS — K219 Gastro-esophageal reflux disease without esophagitis: Secondary | ICD-10-CM | POA: Diagnosis not present

## 2015-05-04 DIAGNOSIS — Z23 Encounter for immunization: Secondary | ICD-10-CM

## 2015-05-04 DIAGNOSIS — I10 Essential (primary) hypertension: Secondary | ICD-10-CM

## 2015-05-04 LAB — CBC WITH DIFFERENTIAL/PLATELET
BASOS PCT: 0.5 % (ref 0.0–3.0)
Basophils Absolute: 0 10*3/uL (ref 0.0–0.1)
Eosinophils Absolute: 0.1 10*3/uL (ref 0.0–0.7)
Eosinophils Relative: 1.7 % (ref 0.0–5.0)
HCT: 38.2 % (ref 36.0–46.0)
HEMOGLOBIN: 12.9 g/dL (ref 12.0–15.0)
LYMPHS PCT: 16.6 % (ref 12.0–46.0)
Lymphs Abs: 1.2 10*3/uL (ref 0.7–4.0)
MCHC: 33.9 g/dL (ref 30.0–36.0)
MCV: 88.1 fl (ref 78.0–100.0)
MONOS PCT: 10.7 % (ref 3.0–12.0)
Monocytes Absolute: 0.8 10*3/uL (ref 0.1–1.0)
NEUTROS ABS: 5.3 10*3/uL (ref 1.4–7.7)
Neutrophils Relative %: 70.5 % (ref 43.0–77.0)
Platelets: 196 10*3/uL (ref 150.0–400.0)
RBC: 4.34 Mil/uL (ref 3.87–5.11)
RDW: 13.9 % (ref 11.5–15.5)
WBC: 7.5 10*3/uL (ref 4.0–10.5)

## 2015-05-04 LAB — BASIC METABOLIC PANEL
BUN: 22 mg/dL (ref 6–23)
CO2: 32 mEq/L (ref 19–32)
CREATININE: 0.98 mg/dL (ref 0.40–1.20)
Calcium: 9.8 mg/dL (ref 8.4–10.5)
Chloride: 103 mEq/L (ref 96–112)
GFR: 58.57 mL/min — AB (ref >60.00)
Glucose, Bld: 97 mg/dL (ref 70–99)
Potassium: 3.7 mEq/L (ref 3.5–5.1)
Sodium: 140 mEq/L (ref 135–145)

## 2015-05-04 LAB — URINALYSIS, ROUTINE W REFLEX MICROSCOPIC
Bilirubin Urine: NEGATIVE
Hgb urine dipstick: NEGATIVE
Ketones, ur: NEGATIVE
LEUKOCYTES UA: NEGATIVE
Nitrite: NEGATIVE
RBC / HPF: NONE SEEN (ref 0–?)
SPECIFIC GRAVITY, URINE: 1.01 (ref 1.000–1.030)
Total Protein, Urine: NEGATIVE
UROBILINOGEN UA: 0.2 (ref 0.0–1.0)
Urine Glucose: NEGATIVE
pH: 6.5 (ref 5.0–8.0)

## 2015-05-04 LAB — HEPATIC FUNCTION PANEL
ALBUMIN: 4.2 g/dL (ref 3.5–5.2)
ALK PHOS: 55 U/L (ref 39–117)
ALT: 13 U/L (ref 0–35)
AST: 19 U/L (ref 0–37)
Bilirubin, Direct: 0.1 mg/dL (ref 0.0–0.3)
TOTAL PROTEIN: 7.9 g/dL (ref 6.0–8.3)
Total Bilirubin: 0.7 mg/dL (ref 0.2–1.2)

## 2015-05-04 LAB — LIPID PANEL
CHOL/HDL RATIO: 3
CHOLESTEROL: 164 mg/dL (ref 0–200)
HDL: 57.1 mg/dL (ref >39.00)
LDL CALC: 85 mg/dL (ref 0–99)
NonHDL: 106.9
Triglycerides: 109 mg/dL (ref 0.0–149.0)
VLDL: 21.8 mg/dL (ref 0.0–40.0)

## 2015-05-04 LAB — HEMOGLOBIN A1C: Hgb A1c MFr Bld: 5.6 % (ref 4.6–6.5)

## 2015-05-04 LAB — TSH: TSH: 0.94 u[IU]/mL (ref 0.35–4.50)

## 2015-05-04 NOTE — Progress Notes (Signed)
Subjective:    Patient ID: [name] [name], female    DOB: Apr 05, 1936, 79 y.o.   MRN: [id number]  HPI  Here for wellness and f/u;  Overall doing ok;  Pt denies Chest pain, worsening SOB, DOE, wheezing, orthopnea, PND, worsening LE edema, palpitations, dizziness or syncope.  Pt denies neurological change such as new headache, facial or extremity weakness.  Pt denies polydipsia, polyuria, or low sugar symptoms. Pt states overall good compliance with treatment and medications, good tolerability, and has been trying to follow appropriate diet.  Pt denies worsening depressive symptoms, suicidal ideation or panic. No fever, night sweats, wt loss, loss of appetite, or other constitutional symptoms.  Pt states good ability with ADL's, has low fall risk, home safety reviewed and adequate, no other significant changes in hearing or vision, and only occasionally active with exercise.  Has had mild worsening reflux, but no abd pain, dysphagia, n/v, bowel change or blood, wants try otc first, and try not ot eat before bed, smaller meals. Does have occasinal urinaryu incont but Denies urinary symptoms such as dysuria, frequency, urgency, flank pain, hematuria or n/v, fever, chills.  Also mentions her recurrent 30- sec left occipital area excrucitating then resolved, intermittent for years, has seen neurology without specific dx or tx per pt.  S/p colonoscopy - no malignancy.  Has ongoing bilat feet soreness, s/p cortisone per podiatry with some left foot improvement. Pt continues to have recurring LBP without change in severity, bowel or bladder change, fever, wt loss,  worsening LE pain/numbness/weakness, gait change or falls. Past Medical History  Diagnosis Date  . Allergic rhinitis   . Sarcoidosis   . Cardiac pacemaker in situ     DOI  08-04-2008  medtronic dual chamber  . Hyperlipidemia   . Shortness of breath     OCCASIONAL  . GERD (gastroesophageal reflux disease)   . History of pulmonary embolus (PE)     . Hypertension   . History of CVA (cerebrovascular accident)     09-15-2005  Left Cerebellar Hematoma with mild stem compression whild on coumadin for DVT  . Venous insufficiency   . History of DVT of lower extremity   . Anxiety   . Renal insufficiency   . VIN III (vulvar intraepithelial neoplasia III)   . Coronary artery disease CARDIOLOGIST-- DR Cristopher Peru    per cath-- Non-obstructive minimal CAD  . Chronic combined systolic and diastolic CHF, NYHA class 2     montior by cardiologist-- dr taylor--  ef 45%  . History of non-ST elevation myocardial infarction (NSTEMI)     2011-- in setting of bilateral PE's and DVT's and CHF  . PAF (paroxysmal atrial fibrillation)   . Mild pulmonary hypertension   . History of cervical dysplasia     CIN III  . Mild obstructive sleep apnea     PT USES Humidied Nasal Mask with O2 at2.5L (study 08-05-2009)  . Thrombophilia     hemotologist--  dr Marin Olp--  monitors pt anitcoagulation  . Chronic anticoagulation   . History of peptic ulcer disease   . OA (osteoarthritis)   . Moderate intermittent asthma   . LBBB (left bundle branch block)    Past Surgical History  Procedure Laterality Date  . Lumbar disc surgery  x2   1990  . Vulvar lesion removal  07/16/2012    Procedure: VULVAR LESION;  Surgeon: Imagene Gurney A. Alycia Rossetti, MD;  Location: [facility];  Service: Gynecology;  Laterality: Right;  Wide Local Excision of  the Vulvar  . Total knee arthroplasty Left 04/21/2013    Procedure: LEFT TOTAL KNEE ARTHROPLASTY;  Surgeon: Gearlean Alf, MD;  Location: [facility];  Service: Orthopedics;  Laterality: Left;  . Cardiac pacemaker placement  08-04-2008  dr taylor gregg    for CHB--  MEDTRONIC DUAL CHAMBER  . Cholecystectomy  01/1996  . Knee arthroscopy Left   . Excision left supraclavicular lymph node   04-22-2007    granulomatous inflammation, extensive  . Insertion of vena cava filter  08/ 2011  . Shoulder arthroscopy with rotator cuff repair and subacromial  decompression Right 02-08-2009  . Deccompression ulnar nerve at cubital tunnel and resection muscle Left 03-17-2008  . Transthoracic echocardiogram  last one 05-19-2014    mild LVH/  ef 45-50%/  septal-lateral dyssynchroncy consistant with LBBB/  diffuse hypokinesis/  grade I diastolic dysfunction/  trivial MR and TR/  mild LAE  . Cardiac catheterization  08-26-2003   dr Lorenz Coaster. non-obstructive CAD & LVD with ef 46%, global HK, 2+MR, PA press 50/24 w/ wedge 20  . Cardiac catheterization  08-03-2008   dr Angelena Form    Non-obstructive CAD/  30-40% pLAD,  30% ostial CFX/  symptomatic bradycardia/  normal renal arteries  . Abdominal hysterectomy  1970's  . Appendectomy  1966  . Vulvectomy N/A 02/19/2015    Procedure: SIMPLE PARTIAL VULVECTOMY;  Surgeon: [name], MD;  Location: [facility];  Service: Gynecology;  Laterality: N/A;    reports that she has never smoked. She has never used smokeless tobacco. She reports that she does not drink alcohol or use illicit drugs. family history includes Breast cancer in her sister; Heart attack in her brother and father; Kidney failure in her sister. Allergies  Allergen Reactions  . Imitrex [Sumatriptan] Other (See Comments)    Reaction unknown  . Sulfa Antibiotics Nausea Only    Doesn't like to drink lots of water.   Current Outpatient Prescriptions on File Prior to Visit  Medication Sig Dispense Refill  . acetaminophen (TYLENOL) 325 MG tablet Take 2 tablets (650 mg total) by mouth every 6 (six) hours as needed. 60 tablet 0  . Azelastine HCl 0.15 % SOLN Place 1 spray into the nose 2 (two) times daily.    . cetirizine (ZYRTEC) 10 MG tablet Take 10 mg by mouth every morning.     . cholecalciferol (VITAMIN D) 1000 UNITS tablet Take 1,000 Units by mouth every morning.     . clorazepate (TRANXENE) 7.5 MG tablet Take 1 tablet (7.5 mg total) by mouth 3 (three) times daily. 90 tablet 3  . fluticasone (FLONASE) 50 MCG/ACT nasal spray 1-2  puffs each nostril once daily (Patient taking differently: Place 1 spray into both nostrils every morning. 1-2 puffs each nostril once daily) 48 g 3  . fluticasone (FLOVENT HFA) 44 MCG/ACT inhaler Inhale 2 puffs two times  daily 42.3 g 6  . folic acid (FOLVITE) 536 MCG tablet Take 400 mcg by mouth every morning.    . furosemide (LASIX) 20 MG tablet Take 1 tablet (20 mg total) by mouth 2 (two) times daily. (Patient taking differently: Take 40 mg by mouth daily. ) 180 tablet 3  . ibuprofen (ADVIL,MOTRIN) 800 MG tablet Take 1 tablet (800 mg total) by mouth every 8 (eight) hours as needed. (Patient not taking: Reported on 05/04/2015) 30 tablet 0  . ipratropium-albuterol (DUONEB) 0.5-2.5 (3) MG/3ML SOLN Take 3 mLs by nebulization 2 (two) times daily. 180 mL 5  .  metoprolol succinate (TOPROL-XL) 25 MG 24 hr tablet Take 1 tablet (25 mg total) by mouth daily before breakfast. 90 tablet 3  . montelukast (SINGULAIR) 10 MG tablet 1 daily (Patient not taking: Reported on 05/04/2015) 10 tablet 1  . Multiple Vitamin (MULTIVITAMIN) tablet Take 1 tablet by mouth daily.    . pantoprazole (PROTONIX) 40 MG tablet Take 1 tablet (40 mg total) by mouth 2 (two) times daily. 180 tablet 3  . Polyethyl Glycol-Propyl Glycol (SYSTANE) 0.4-0.3 % SOLN Place 2 drops into both eyes 4 (four) times daily.     . pravastatin (PRAVACHOL) 40 MG tablet Take 1 tablet (40 mg total) by mouth at bedtime. 90 tablet 3  . sennosides-docusate sodium (SENOKOT-S) 8.6-50 MG tablet Take 2 tablets by mouth at bedtime.     Alveda Reasons 20 MG TABS tablet Take 1 tablet by mouth  daily 90 tablet 2   No current facility-administered medications on file prior to visit.    Review of Systems Constitutional: Negative for increased diaphoresis, other activity, appetite or siginficant weight change other than noted HENT: Negative for worsening hearing loss, ear pain, facial swelling, mouth sores and neck stiffness.   Eyes: Negative for other worsening pain,  redness or visual disturbance.  Respiratory: Negative for shortness of breath and wheezing  Cardiovascular: Negative for chest pain and palpitations.  Gastrointestinal: Negative for diarrhea, blood in stool, abdominal distention or other pain Genitourinary: Negative for hematuria, flank pain or change in urine volume.  Musculoskeletal: Negative for myalgias or other joint complaints.  Skin: Negative for color change and wound or drainage.  Neurological: Negative for syncope and numbness. other than noted Hematological: Negative for adenopathy. or other swelling Psychiatric/Behavioral: Negative for hallucinations, SI, self-injury, decreased concentration or other worsening agitation.      Objective:   Physical Exam BP 118/82 mmHg  Pulse 83  Temp(Src) 98.1 F (36.7 C) (Oral)  Wt 180 lb (81.647 kg)  SpO2 94% VS noted,  Constitutional: Pt is oriented to person, place, and time. Appears well-developed and well-nourished, in no significant distress Head: Normocephalic and atraumatic.  Right Ear: External ear normal.  Left Ear: External ear normal.  Nose: Nose normal.  Mouth/Throat: Oropharynx is clear and moist.  Eyes: Conjunctivae and EOM are normal. Pupils are equal, round, and reactive to light.  Neck: Normal range of motion. Neck supple. No JVD present. No tracheal deviation present or significant neck LA or mass Cardiovascular: Normal rate, regular rhythm, normal heart sounds and intact distal pulses.   Pulmonary/Chest: Effort normal and breath sounds without rales or wheezing  Abdominal: Soft. Bowel sounds are normal. NT. No HSM  Musculoskeletal: Normal range of motion. Exhibits no edema.  Lymphadenopathy:  Has no cervical adenopathy.  Neurological: Pt is alert and oriented to person, place, and time. Pt has normal reflexes. No cranial nerve deficit. Motor grossly intact Skin: Skin is warm and dry. No rash noted.  Psychiatric:  Has normal mood and affect. Behavior is normal.       Assessment & Plan:

## 2015-05-04 NOTE — Assessment & Plan Note (Signed)
Ok for [employer] 20 qd prn

## 2015-05-04 NOTE — Patient Instructions (Signed)
You had the Td shot today  Please continue all other medications as before, and refills have been done if requested.  Please have the pharmacy call with any other refills you may need.  Please continue your efforts at being more active, low cholesterol diet, and weight control.  You are otherwise up to date with prevention measures today.  Please keep your appointments with your specialists as you may have planned  Please go to the LAB in the Basement (turn left off the elevator) for the tests to be done today  You will be contacted by phone if any changes need to be made immediately.  Otherwise, you will receive a letter about your results with an explanation, but please check with MyChart first.  Please remember to sign up for MyChart if you have not done so, as this will be important to you in the future with finding out test results, communicating by private email, and scheduling acute appointments online when needed.  Please return in 6 months, or sooner if needed

## 2015-05-04 NOTE — Addendum Note (Signed)
Addended by: Biagio Borg on: 05/04/2015 11:52 AM   Modules accepted: Orders

## 2015-05-04 NOTE — Progress Notes (Signed)
Pre visit review using our clinic review tool, if applicable. No additional management support is needed unless otherwise documented below in the visit note. 

## 2015-05-04 NOTE — Addendum Note (Signed)
Addended by: Lyman Bishop on: 05/04/2015 11:39 AM   Modules accepted: Orders

## 2015-05-04 NOTE — Assessment & Plan Note (Signed)
Stable, for a1c today 

## 2015-05-04 NOTE — Assessment & Plan Note (Signed)

## 2015-06-02 ENCOUNTER — Ambulatory Visit: Payer: Medicare Other | Attending: Gynecologic Oncology | Admitting: Gynecologic Oncology

## 2015-06-02 ENCOUNTER — Encounter: Payer: Self-pay | Admitting: Gynecologic Oncology

## 2015-06-02 VITALS — BP 142/71 | HR 84 | Temp 97.7°F | Resp 18 | Ht 64.5 in | Wt 186.6 lb

## 2015-06-02 DIAGNOSIS — Z483 Aftercare following surgery for neoplasm: Secondary | ICD-10-CM | POA: Diagnosis present

## 2015-06-02 DIAGNOSIS — D071 Carcinoma in situ of vulva: Secondary | ICD-10-CM | POA: Diagnosis not present

## 2015-06-02 DIAGNOSIS — Z8544 Personal history of malignant neoplasm of other female genital organs: Secondary | ICD-10-CM | POA: Diagnosis not present

## 2015-06-02 DIAGNOSIS — Z9079 Acquired absence of other genital organ(s): Secondary | ICD-10-CM | POA: Diagnosis not present

## 2015-06-02 NOTE — Progress Notes (Signed)
POSTOP FOLLOWUP VISIT  HPI:  [name] [name] is a 79 y.o. year old initially seen in consultation on 02/10/15, referred by Dr Benjie Karvonen, for [facility].  She then underwent a partial simple posterior vulvectomy and distal vaginectomy on 05/19/36 without complications.  Her postoperative course was uncomplicated.  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  She is seen today for an ongoing surveillance.  She is feeling overall well. She has had recent cataract surgery and foot surgery. She denies any vulvar pruritis or irritation. She denies noting any new vulvar lesions. She has no discharge or bleeding.   Review of systems: Constitutional:  She has no weight gain or weight loss. She has no fever or chills. Eyes: No blurred vision Ears, Nose, Mouth, Throat: No dizziness, headaches or changes in hearing. No mouth sores. Cardiovascular: No chest pain, palpitations or edema. Respiratory:  No shortness of breath, wheezing or cough Gastrointestinal: She has normal bowel movements without diarrhea or constipation. She denies any nausea or vomiting. She denies blood in her stool or heart burn. Genitourinary:  She denies pelvic pain, pelvic pressure or changes in her urinary function. She has no hematuria, dysuria, or incontinence. She has no irregular vaginal bleeding or vaginal discharge Musculoskeletal: Denies muscle weakness or joint pains.  Skin:  She has no skin changes, rashes or itching Neurological:  Denies dizziness or headaches. No neuropathy, no numbness or tingling. Psychiatric:  She denies depression or anxiety. Hematologic/Lymphatic:   No easy bruising or bleeding   Physical Exam: Blood pressure 142/71, pulse 84, temperature 97.7 F (36.5 C), temperature source Oral, resp. rate 18, height 5' 4.5" (1.638 m), weight 186 lb 9.6 oz (84.641 kg), SpO2 98 %. General: Well dressed, well nourished in no apparent distress.   HEENT:  Wearing protective glasses Skin:  No  lesions or rashes. Breasts: deferred. Lungs: deferred Cardiovascular: deferred Abdomen:  deferred  Genitourinary: vulvo-vaginal incision healed well, intact. No drainage or suppuration. No infection. With application of 3% acetic acid there is no evidence of dysplasia.  Extremities: No cyanosis, clubbing or edema.  No calf tenderness or erythema. No palpable cords. Psychiatric: Mood and affect are appropriate. Neurological: Awake, alert and oriented x 3. Sensation is intact, no neuropathy.  Musculoskeletal: No pain, normal strength and range of motion.  Assessment:    79 y.o. year old with VIN III.   S/p distal posterior vaginectomy and partial simple posterior vulvectomy on 02/19/15. The vaginal margin was focally microscopically positive. Free of disease on today's exam.  Plan: 1) Continue 6 monthly surveillance exams with Dr Benjie Karvonen or myself. 2) Treatment counseling - discussed the high risk for recurrence with VIN III particularly with a positive margin. Recommend surveillance checks with acetic acid application to vulva every 6 months. Recommend patient self inspection for new lesions with mirror every month. She was given the opportunity to ask questions, which were answered to her satisfaction, and she is agreement with the above mentioned plan of care.  Donaciano Eva, MD

## 2015-06-02 NOTE — Patient Instructions (Signed)
Followup with Dr. Denman George in 6 months as scheduled above. Please call us sooner with any questions or if you notice any new symptoms.

## 2015-06-03 ENCOUNTER — Ambulatory Visit (INDEPENDENT_AMBULATORY_CARE_PROVIDER_SITE_OTHER): Payer: Medicare Other | Admitting: *Deleted

## 2015-06-03 ENCOUNTER — Telehealth: Payer: Self-pay | Admitting: Internal Medicine

## 2015-06-03 DIAGNOSIS — I442 Atrioventricular block, complete: Secondary | ICD-10-CM | POA: Diagnosis not present

## 2015-06-03 MED ORDER — PREDNISONE 10 MG PO TABS: 10.0000 mg | ORAL_TABLET | Freq: Every day | ORAL | Status: DC

## 2015-06-03 NOTE — Progress Notes (Signed)
Remote pacemaker transmission.   

## 2015-06-03 NOTE — Telephone Encounter (Signed)
LMTCB

## 2015-06-03 NOTE — Telephone Encounter (Signed)
Spoke with pt, she is aware of results/recs.  Nothing further needed.

## 2015-06-03 NOTE — Telephone Encounter (Signed)
Offer prednisone 10 mg, # 5, 1 daily x 5 days, then stop. Ok to take otc Delsym if needed for cough

## 2015-06-03 NOTE — Telephone Encounter (Signed)
Pt c/o dry, hacky cough for past week, occas wheezing, sinus drainage (clear).  Denies sob or chest tightness.  Pt hasnt tried anything otc yet.  Allergies  Allergen Reactions  . Imitrex [Sumatriptan] Other (See Comments)    Reaction unknown  . Sulfa Antibiotics Nausea Only    Doesn't like to drink lots of water.    Current Outpatient Prescriptions on File Prior to Visit  Medication Sig Dispense Refill  . acetaminophen (TYLENOL) 325 MG tablet Take 2 tablets (650 mg total) by mouth every 6 (six) hours as needed. 60 tablet 0  . Azelastine HCl 0.15 % SOLN Place 1 spray into the nose 2 (two) times daily.    . cetirizine (ZYRTEC) 10 MG tablet Take 10 mg by mouth every morning.     . cholecalciferol (VITAMIN D) 1000 UNITS tablet Take 1,000 Units by mouth every morning.     . clorazepate (TRANXENE) 7.5 MG tablet Take 1 tablet (7.5 mg total) by mouth 3 (three) times daily. 90 tablet 3  . fluticasone (FLONASE) 50 MCG/ACT nasal spray 1-2 puffs each nostril once daily (Patient taking differently: Place 1 spray into both nostrils every morning. 1-2 puffs each nostril once daily) 48 g 3  . fluticasone (FLOVENT HFA) 44 MCG/ACT inhaler Inhale 2 puffs two times  daily 43.1 g 6  . folic acid (FOLVITE) 540 MCG tablet Take 400 mcg by mouth every morning.    . furosemide (LASIX) 20 MG tablet Take 1 tablet (20 mg total) by mouth 2 (two) times daily. (Patient taking differently: Take 40 mg by mouth daily. ) 180 tablet 3  . ipratropium-albuterol (DUONEB) 0.5-2.5 (3) MG/3ML SOLN Take 3 mLs by nebulization 2 (two) times daily. 180 mL 5  . ketorolac (ACULAR) 0.4 % SOLN     . metoprolol succinate (TOPROL-XL) 25 MG 24 hr tablet Take 1 tablet (25 mg total) by mouth daily before breakfast. 90 tablet 3  . Multiple Vitamin (MULTIVITAMIN) tablet Take 1 tablet by mouth daily.    [name] Kitchen ofloxacin (OCUFLOX) 0.3 % ophthalmic solution     . pantoprazole (PROTONIX) 40 MG tablet Take 1 tablet (40 mg total) by mouth 2 (two) times  daily. 180 tablet 3  . Polyethyl Glycol-Propyl Glycol (SYSTANE) 0.4-0.3 % SOLN Place 2 drops into both eyes 4 (four) times daily.     . pravastatin (PRAVACHOL) 40 MG tablet Take 1 tablet (40 mg total) by mouth at bedtime. 90 tablet 3  . prednisoLONE acetate (PRED FORTE) 1 % ophthalmic suspension     . sennosides-docusate sodium (SENOKOT-S) 8.6-50 MG tablet Take 2 tablets by mouth at bedtime.     Alveda Reasons 20 MG TABS tablet Take 1 tablet by mouth  daily 90 tablet 2   No current facility-administered medications on file prior to visit.

## 2015-06-03 NOTE — Telephone Encounter (Signed)
LMTC x 1  

## 2015-06-03 NOTE — Telephone Encounter (Signed)
Pt returned call  [phone]

## 2015-06-04 ENCOUNTER — Ambulatory Visit: Payer: Medicare Other | Admitting: Gynecologic Oncology

## 2015-06-10 LAB — CUP PACEART REMOTE DEVICE CHECK
Battery Impedance: 2400 Ohm
Brady Statistic AP VP Percent: 17 %
Brady Statistic AP VS Percent: 0 %
Brady Statistic AS VS Percent: 2 %
Lead Channel Impedance Value: 386 Ohm
Lead Channel Impedance Value: 468 Ohm
Lead Channel Pacing Threshold Amplitude: 0.5 V
Lead Channel Pacing Threshold Pulse Width: 0.4 ms
Lead Channel Pacing Threshold Pulse Width: 0.4 ms
Lead Channel Setting Pacing Amplitude: 2 V
Lead Channel Setting Pacing Amplitude: 2.5 V
Lead Channel Setting Sensing Sensitivity: 4 mV
MDC IDC MSMT BATTERY REMAINING LONGEVITY: 23 mo
MDC IDC MSMT BATTERY VOLTAGE: 2.74 V
MDC IDC MSMT LEADCHNL RA SENSING INTR AMPL: 2.8 mV — AB
MDC IDC MSMT LEADCHNL RV PACING THRESHOLD AMPLITUDE: 1.125 V
MDC IDC SESS DTM: 20160616103341
MDC IDC SET LEADCHNL RV PACING PULSEWIDTH: 0.4 ms
MDC IDC STAT BRADY AS VP PERCENT: 80 %

## 2015-06-14 ENCOUNTER — Other Ambulatory Visit: Payer: Self-pay | Admitting: Internal Medicine

## 2015-06-14 ENCOUNTER — Telehealth: Payer: Self-pay | Admitting: Internal Medicine

## 2015-06-14 MED ORDER — VALACYCLOVIR HCL 1 G PO TABS: ORAL_TABLET | ORAL | Status: DC

## 2015-06-14 NOTE — Telephone Encounter (Signed)
Rx sent 

## 2015-06-14 NOTE — Telephone Encounter (Signed)
Patient ask if you could send her something in the pharmacy, fever blisters in her mouth, please advise, she heard that Valtrex was good maybe she could try that

## 2015-06-22 ENCOUNTER — Encounter: Payer: Self-pay | Admitting: Cardiology

## 2015-06-22 ENCOUNTER — Other Ambulatory Visit: Payer: Self-pay

## 2015-06-22 MED ORDER — METOPROLOL SUCCINATE ER 25 MG PO TB24: 25.0000 mg | ORAL_TABLET | Freq: Every day | ORAL | Status: DC

## 2015-06-22 MED ORDER — FUROSEMIDE 40 MG PO TABS: 40.0000 mg | ORAL_TABLET | Freq: Every day | ORAL | Status: DC

## 2015-06-23 ENCOUNTER — Encounter: Payer: Self-pay | Admitting: Pulmonary Disease

## 2015-06-25 ENCOUNTER — Encounter: Payer: Self-pay | Admitting: Internal Medicine

## 2015-08-02 ENCOUNTER — Other Ambulatory Visit: Payer: Self-pay

## 2015-08-02 MED ORDER — CLORAZEPATE DIPOTASSIUM 7.5 MG PO TABS: 7.5000 mg | ORAL_TABLET | Freq: Three times a day (TID) | ORAL | Status: DC

## 2015-08-09 ENCOUNTER — Telehealth: Payer: Self-pay | Admitting: *Deleted

## 2015-08-09 NOTE — Telephone Encounter (Signed)
Left msg on triage requesting refill on her Clorazepate need to go to [facility]. MD out of office will hold for his return tomorrow...[name] [name]

## 2015-08-10 LAB — HM MAMMOGRAPHY: HM Mammogram: NEGATIVE

## 2015-08-10 NOTE — Telephone Encounter (Signed)
Tranxene Too soon since it was just done Aug 02, 2015

## 2015-08-10 NOTE — Telephone Encounter (Signed)
Pls advise msg below...[name] [name]

## 2015-08-11 ENCOUNTER — Encounter: Payer: Self-pay | Admitting: Internal Medicine

## 2015-08-11 NOTE — Telephone Encounter (Signed)
Pt states she never received script. Called [facility] spoke with pharmacy Chelsey she states they did not receive last fill with them 06/19/15. Gave verbal authorization on tranxepate. Notified pt was call in to [facility]...Johny Chess

## 2015-09-07 ENCOUNTER — Ambulatory Visit (INDEPENDENT_AMBULATORY_CARE_PROVIDER_SITE_OTHER): Payer: Medicare Other | Admitting: Internal Medicine

## 2015-09-07 ENCOUNTER — Encounter: Payer: Self-pay | Admitting: Internal Medicine

## 2015-09-07 ENCOUNTER — Telehealth: Payer: Self-pay | Admitting: Internal Medicine

## 2015-09-07 VITALS — BP 166/92 | HR 105 | Ht 64.0 in | Wt 185.2 lb

## 2015-09-07 DIAGNOSIS — R0602 Shortness of breath: Secondary | ICD-10-CM | POA: Diagnosis not present

## 2015-09-07 DIAGNOSIS — I2510 Atherosclerotic heart disease of native coronary artery without angina pectoris: Secondary | ICD-10-CM | POA: Diagnosis not present

## 2015-09-07 DIAGNOSIS — I5042 Chronic combined systolic (congestive) and diastolic (congestive) heart failure: Secondary | ICD-10-CM | POA: Diagnosis not present

## 2015-09-07 DIAGNOSIS — I6350 Cerebral infarction due to unspecified occlusion or stenosis of unspecified cerebral artery: Secondary | ICD-10-CM

## 2015-09-07 DIAGNOSIS — Z950 Presence of cardiac pacemaker: Secondary | ICD-10-CM

## 2015-09-07 DIAGNOSIS — I639 Cerebral infarction, unspecified: Secondary | ICD-10-CM

## 2015-09-07 LAB — CUP PACEART INCLINIC DEVICE CHECK
Battery Impedance: 2524 Ohm
Battery Remaining Longevity: 23 mo
Battery Voltage: 2.73 V
Brady Statistic AP VS Percent: 0 %
Date Time Interrogation Session: 20160920123333
Lead Channel Impedance Value: 413 Ohm
Lead Channel Impedance Value: 521 Ohm
Lead Channel Pacing Threshold Pulse Width: 0.4 ms
Lead Channel Sensing Intrinsic Amplitude: 2.8 mV
Lead Channel Setting Pacing Amplitude: 2 V
Lead Channel Setting Pacing Amplitude: 2.5 V
Lead Channel Setting Sensing Sensitivity: 5.6 mV
MDC IDC MSMT LEADCHNL RA PACING THRESHOLD AMPLITUDE: 0.5 V
MDC IDC MSMT LEADCHNL RA PACING THRESHOLD PULSEWIDTH: 0.4 ms
MDC IDC MSMT LEADCHNL RV PACING THRESHOLD AMPLITUDE: 0.75 V
MDC IDC MSMT LEADCHNL RV SENSING INTR AMPL: 11.2 mV
MDC IDC SET LEADCHNL RV PACING PULSEWIDTH: 0.4 ms
MDC IDC STAT BRADY AP VP PERCENT: 17 %
MDC IDC STAT BRADY AS VP PERCENT: 73 %
MDC IDC STAT BRADY AS VS PERCENT: 9 %

## 2015-09-07 NOTE — Telephone Encounter (Signed)
New message      Pt states she gave Dr Lovena Le a form for a handicapp parking tag when she was here.  She forgot to get it.  She will pick it up Friday when she is here again

## 2015-09-07 NOTE — Assessment & Plan Note (Signed)
She has residual problems with balance but no worsening.

## 2015-09-07 NOTE — Assessment & Plan Note (Signed)
Her medtronic PPM is working normally. Will recheck in several months. 

## 2015-09-07 NOTE — Assessment & Plan Note (Signed)
Her symptoms are worse. She will undergo a 2D echo and continue her lasix. Additional rec's will pend the result of her echo.

## 2015-09-07 NOTE — Patient Instructions (Addendum)
Medication Instructions:  Your physician recommends that you continue on your current medications as directed. Please refer to the Current Medication list given to you today.   Labwork: None ordered   Testing/Procedures: Your physician has requested that you have an echocardiogram. Echocardiography is a painless test that uses sound waves to create images of your heart. It provides your doctor with information about the size and shape of your heart and how well your heart's chambers and valves are working. This procedure takes approximately one hour. There are no restrictions for this procedure.    Follow-Up: Your physician wants you to follow-up in: 12 months with Dr Knox Saliva will receive a reminder letter in the mail two months in advance. If you don't receive a letter, please call our office to schedule the follow-up appointment.  Remote monitoring is used to monitor your Pacemaker from home. This monitoring reduces the number of office visits required to check your device to one time per year. It allows [place] to keep an eye on the functioning of your device to ensure it is working properly. You are scheduled for a device check from home on 12/07/15. You may send your transmission at any time that day. If you have a wireless device, the transmission will be sent automatically. After your physician reviews your transmission, you will receive a postcard with your next transmission date.     Any Other Special Instructions Will Be Listed Below (If Applicable).

## 2015-09-07 NOTE — Assessment & Plan Note (Signed)
No anginal symptoms will follow.

## 2015-09-07 NOTE — Progress Notes (Signed)
HPI [name] [name] returns today for followup. She is a very pleasant 79 year old woman with a history of intermittent complete heart block, status post permanent pacemaker insertion, hypertension, dyslipidemia, and stroke. She has paroxysmal atrial fibrillation. The patient has done well in the interim, and is tolerating her anticoagulation very nicely. Previously, she took Lovenox injections, but is now on Xarelto. She denies chest pain or syncope. She does have generalized fatigue and c/o sob. She can walk about 200 feet and has to stop for rest.  Allergies  Allergen Reactions  . Imitrex [Sumatriptan] Other (See Comments)    Reaction unknown  . Sulfa Antibiotics Nausea Only    Doesn't like to drink lots of water.     Current Outpatient Prescriptions  Medication Sig Dispense Refill  . acetaminophen (TYLENOL) 325 MG tablet Take 650 mg by mouth every 6 (six) hours as needed (pain).    . Azelastine HCl 0.15 % SOLN Place 1 spray into the nose 2 (two) times daily.    . cetirizine (ZYRTEC) 10 MG tablet Take 10 mg by mouth every morning.     . cholecalciferol (VITAMIN D) 1000 UNITS tablet Take 1,000 Units by mouth every morning.     . clorazepate (TRANXENE) 7.5 MG tablet Take 1 tablet (7.5 mg total) by mouth 3 (three) times daily. 90 tablet 3  . fluticasone (FLONASE) 50 MCG/ACT nasal spray 1-2 puffs each nostril once daily (Patient taking differently: Place 1 spray into both nostrils every morning. 1-2 puffs each nostril once daily) 48 g 3  . fluticasone (FLOVENT HFA) 44 MCG/ACT inhaler Inhale 2 puffs into the lungs 2 (two) times daily.    . folic acid (FOLVITE) 662 MCG tablet Take 400 mcg by mouth every morning.    . furosemide (LASIX) 40 MG tablet Take 1 tablet (40 mg total) by mouth daily. 90 tablet 3  . ipratropium-albuterol (DUONEB) 0.5-2.5 (3) MG/3ML SOLN Take 3 mLs by nebulization 2 (two) times daily. 180 mL 5  . metoprolol succinate (TOPROL-XL) 25 MG 24 hr tablet Take 1 tablet (25 mg total) by  mouth daily before breakfast. 90 tablet 3  . Multiple Vitamin (MULTIVITAMIN) tablet Take 1 tablet by mouth daily.    . pantoprazole (PROTONIX) 40 MG tablet Take 1 tablet (40 mg total) by mouth 2 (two) times daily. 180 tablet 3  . Polyethyl Glycol-Propyl Glycol (SYSTANE) 0.4-0.3 % SOLN Place 2 drops into both eyes 4 (four) times daily.     . pravastatin (PRAVACHOL) 40 MG tablet Take 1 tablet (40 mg total) by mouth at bedtime. 90 tablet 3  . predniSONE (DELTASONE) 10 MG tablet Take 1 tablet (10 mg total) by mouth daily with breakfast. 5 tablet 0  . sennosides-docusate sodium (SENOKOT-S) 8.6-50 MG tablet Take 2 tablets by mouth at bedtime.     Alveda Reasons 20 MG TABS tablet Take 1 tablet by mouth  daily 90 tablet 2   No current facility-administered medications for this visit.     Past Medical History  Diagnosis Date  . Allergic rhinitis   . Sarcoidosis   . Cardiac pacemaker in situ     DOI  08-04-2008  medtronic dual chamber  . Hyperlipidemia   . Shortness of breath     OCCASIONAL  . GERD (gastroesophageal reflux disease)   . History of pulmonary embolus (PE)   . Hypertension   . History of CVA (cerebrovascular accident)     09-15-2005  Left Cerebellar Hematoma with mild stem compression whild on coumadin for  DVT  . Venous insufficiency   . History of DVT of lower extremity   . Anxiety   . Renal insufficiency   . VIN III (vulvar intraepithelial neoplasia III)   . Coronary artery disease CARDIOLOGIST-- DR Cristopher Peru    per cath-- Non-obstructive minimal CAD  . Chronic combined systolic and diastolic CHF, NYHA class 2     montior by cardiologist-- dr taylor--  ef 45%  . History of non-ST elevation myocardial infarction (NSTEMI)     2011-- in setting of bilateral PE's and DVT's and CHF  . PAF (paroxysmal atrial fibrillation)   . Mild pulmonary hypertension   . History of cervical dysplasia     CIN III  . Mild obstructive sleep apnea     PT USES Humidied Nasal Mask with O2 at2.5L  (study 08-05-2009)  . Thrombophilia     hemotologist--  dr Marin Olp--  monitors pt anitcoagulation  . Chronic anticoagulation   . History of peptic ulcer disease   . OA (osteoarthritis)   . Moderate intermittent asthma   . LBBB (left bundle branch block)     ROS:   All systems reviewed and negative except as noted in the HPI.   Past Surgical History  Procedure Laterality Date  . Lumbar disc surgery  x2   1990  . Vulvar lesion removal  07/16/2012    Procedure: VULVAR LESION;  Surgeon: Imagene Gurney A. Alycia Rossetti, MD;  Location: [facility];  Service: Gynecology;  Laterality: Right;  Wide Local Excision of the Vulvar  . Total knee arthroplasty Left 04/21/2013    Procedure: LEFT TOTAL KNEE ARTHROPLASTY;  Surgeon: Gearlean Alf, MD;  Location: [facility];  Service: Orthopedics;  Laterality: Left;  . Cardiac pacemaker placement  08-04-2008  dr taylor gregg    for CHB--  MEDTRONIC DUAL CHAMBER  . Cholecystectomy  01/1996  . Knee arthroscopy Left   . Excision left supraclavicular lymph node   04-22-2007    granulomatous inflammation, extensive  . Insertion of vena cava filter  08/ 2011  . Shoulder arthroscopy with rotator cuff repair and subacromial decompression Right 02-08-2009  . Deccompression ulnar nerve at cubital tunnel and resection muscle Left 03-17-2008  . Transthoracic echocardiogram  last one 05-19-2014    mild LVH/  ef 45-50%/  septal-lateral dyssynchroncy consistant with LBBB/  diffuse hypokinesis/  grade I diastolic dysfunction/  trivial MR and TR/  mild LAE  . Cardiac catheterization  08-26-2003   dr Lorenz Coaster. non-obstructive CAD & LVD with ef 46%, global HK, 2+MR, PA press 50/24 w/ wedge 20  . Cardiac catheterization  08-03-2008   dr Angelena Form    Non-obstructive CAD/  30-40% pLAD,  30% ostial CFX/  symptomatic bradycardia/  normal renal arteries  . Abdominal hysterectomy  1970's  . Appendectomy  1966  . Vulvectomy N/A 02/19/2015    Procedure: SIMPLE PARTIAL VULVECTOMY;  Surgeon: [name], MD;  Location[facility];  Service: Gynecology;  Laterality: N/A;     Family History  Problem Relation Age of Onset  . Heart attack Father   . Breast cancer Sister     mets  . Heart attack Brother   . Kidney failure Sister      Social History   Social History  . Marital Status: Widowed    Spouse Name: N/A  . Number of Children: 4  . Years of Education: N/A   Occupational History  . retired    Social History Main Topics  .  Smoking status: Never Smoker   . Smokeless tobacco: Never Used     Comment: NEVER USED TOBACCO  . Alcohol Use: No  . Drug Use: No  . Sexual Activity: Not on file   Other Topics Concern  . Not on file   Social History Narrative     BP 166/92 mmHg  Pulse 105  Ht 5\' 4"  (1.626 m)  Wt 185 lb 3.2 oz (84.006 kg)  BMI 31.77 kg/m2  SpO2 94%  Physical Exam:  stable appearing 79 year old woman,NAD HEENT: Unremarkable Neck:  7 cm JVD, no thyromegally Back:  No CVA tenderness Lungs:  Clear with no wheezes, rales, or rhonchi. HEART:  IRegular rate rhythm, no murmurs, no rubs, no clicks Abd:  soft, positive bowel sounds, no organomegally, no rebound, no guarding Ext:  2 plus pulses, no edema, no cyanosis, no clubbing Skin:  No rashes no nodules Neuro:  CN II through XII intact, motor grossly intact  DEVICE  Normal device function.  See PaceArt for details.   Assess/Plan:

## 2015-09-08 ENCOUNTER — Other Ambulatory Visit: Payer: Self-pay

## 2015-09-08 MED ORDER — PANTOPRAZOLE SODIUM 40 MG PO TBEC: 40.0000 mg | DELAYED_RELEASE_TABLET | Freq: Two times a day (BID) | ORAL | Status: DC

## 2015-09-10 ENCOUNTER — Other Ambulatory Visit: Payer: Self-pay

## 2015-09-10 ENCOUNTER — Ambulatory Visit (HOSPITAL_COMMUNITY): Payer: Medicare Other | Attending: Internal Medicine

## 2015-09-10 DIAGNOSIS — I071 Rheumatic tricuspid insufficiency: Secondary | ICD-10-CM | POA: Insufficient documentation

## 2015-09-10 DIAGNOSIS — G4733 Obstructive sleep apnea (adult) (pediatric): Secondary | ICD-10-CM | POA: Insufficient documentation

## 2015-09-10 DIAGNOSIS — I10 Essential (primary) hypertension: Secondary | ICD-10-CM | POA: Insufficient documentation

## 2015-09-10 DIAGNOSIS — K219 Gastro-esophageal reflux disease without esophagitis: Secondary | ICD-10-CM | POA: Diagnosis not present

## 2015-09-10 DIAGNOSIS — I340 Nonrheumatic mitral (valve) insufficiency: Secondary | ICD-10-CM | POA: Insufficient documentation

## 2015-09-10 DIAGNOSIS — R0602 Shortness of breath: Secondary | ICD-10-CM

## 2015-09-10 DIAGNOSIS — E785 Hyperlipidemia, unspecified: Secondary | ICD-10-CM | POA: Insufficient documentation

## 2015-09-17 ENCOUNTER — Telehealth: Payer: Self-pay | Admitting: Internal Medicine

## 2015-09-17 NOTE — Telephone Encounter (Signed)
Informed patient of results/KLanier,RN  

## 2015-09-17 NOTE — Telephone Encounter (Signed)
New problem   Pt want to know results of her echo she had done 9.23.16. Please advise

## 2015-09-30 ENCOUNTER — Ambulatory Visit: Payer: Medicare Other | Admitting: Internal Medicine

## 2015-10-04 ENCOUNTER — Telehealth: Payer: Self-pay | Admitting: Internal Medicine

## 2015-10-04 NOTE — Telephone Encounter (Signed)
lmomtcb x1 for pt 

## 2015-10-04 NOTE — Telephone Encounter (Signed)
Called spoke with patient who c/o head congestion with clear drainage, PND, deep cough but no mucus production x2+ weeks, some occasional wheezing and dyspnea.  Went on vacation to her son's in [place] - came home on the 12th, was gone for 7 days.  She does report one night of chills while on her trip.  [facility] Allergies  Allergen Reactions  . Imitrex [Sumatriptan] Other (See Comments)    Reaction unknown  . Sulfa Antibiotics Nausea Only    Doesn't like to drink lots of water.   CDY not in the office today, will forward to doc of the day Dr Elsworth Soho please advise, thank you.

## 2015-10-04 NOTE — Telephone Encounter (Signed)
zpak Prednisone 10 mg tabs  Take 2 tabs daily with food x 5ds, then 1 tab daily with food x 5ds then STOP Office visit if no better in 2 days

## 2015-10-05 ENCOUNTER — Emergency Department (HOSPITAL_BASED_OUTPATIENT_CLINIC_OR_DEPARTMENT_OTHER): Payer: Medicare Other

## 2015-10-05 ENCOUNTER — Encounter (HOSPITAL_BASED_OUTPATIENT_CLINIC_OR_DEPARTMENT_OTHER): Payer: Self-pay | Admitting: *Deleted

## 2015-10-05 ENCOUNTER — Emergency Department (HOSPITAL_BASED_OUTPATIENT_CLINIC_OR_DEPARTMENT_OTHER)
Admission: EM | Admit: 2015-10-05 | Discharge: 2015-10-05 | Disposition: A | Discharge status: Home / Self Care | Payer: Medicare Other | Attending: Emergency Medicine | Admitting: Emergency Medicine

## 2015-10-05 DIAGNOSIS — I5042 Chronic combined systolic (congestive) and diastolic (congestive) heart failure: Secondary | ICD-10-CM | POA: Diagnosis not present

## 2015-10-05 DIAGNOSIS — Z862 Personal history of diseases of the blood and blood-forming organs and certain disorders involving the immune mechanism: Secondary | ICD-10-CM | POA: Diagnosis not present

## 2015-10-05 DIAGNOSIS — E785 Hyperlipidemia, unspecified: Secondary | ICD-10-CM | POA: Insufficient documentation

## 2015-10-05 DIAGNOSIS — I252 Old myocardial infarction: Secondary | ICD-10-CM | POA: Insufficient documentation

## 2015-10-05 DIAGNOSIS — Z7952 Long term (current) use of systemic steroids: Secondary | ICD-10-CM | POA: Insufficient documentation

## 2015-10-05 DIAGNOSIS — Z9889 Other specified postprocedural states: Secondary | ICD-10-CM | POA: Insufficient documentation

## 2015-10-05 DIAGNOSIS — Z8711 Personal history of peptic ulcer disease: Secondary | ICD-10-CM | POA: Diagnosis not present

## 2015-10-05 DIAGNOSIS — Z7951 Long term (current) use of inhaled steroids: Secondary | ICD-10-CM | POA: Diagnosis not present

## 2015-10-05 DIAGNOSIS — Z87448 Personal history of other diseases of urinary system: Secondary | ICD-10-CM | POA: Insufficient documentation

## 2015-10-05 DIAGNOSIS — K219 Gastro-esophageal reflux disease without esophagitis: Secondary | ICD-10-CM | POA: Insufficient documentation

## 2015-10-05 DIAGNOSIS — Z79899 Other long term (current) drug therapy: Secondary | ICD-10-CM | POA: Diagnosis not present

## 2015-10-05 DIAGNOSIS — I2510 Atherosclerotic heart disease of native coronary artery without angina pectoris: Secondary | ICD-10-CM | POA: Diagnosis not present

## 2015-10-05 DIAGNOSIS — Z86718 Personal history of other venous thrombosis and embolism: Secondary | ICD-10-CM | POA: Insufficient documentation

## 2015-10-05 DIAGNOSIS — M1990 Unspecified osteoarthritis, unspecified site: Secondary | ICD-10-CM | POA: Diagnosis not present

## 2015-10-05 DIAGNOSIS — Z8673 Personal history of transient ischemic attack (TIA), and cerebral infarction without residual deficits: Secondary | ICD-10-CM | POA: Diagnosis not present

## 2015-10-05 DIAGNOSIS — Z950 Presence of cardiac pacemaker: Secondary | ICD-10-CM | POA: Diagnosis not present

## 2015-10-05 DIAGNOSIS — Z7901 Long term (current) use of anticoagulants: Secondary | ICD-10-CM | POA: Insufficient documentation

## 2015-10-05 DIAGNOSIS — Z8659 Personal history of other mental and behavioral disorders: Secondary | ICD-10-CM | POA: Insufficient documentation

## 2015-10-05 DIAGNOSIS — R05 Cough: Secondary | ICD-10-CM | POA: Diagnosis present

## 2015-10-05 DIAGNOSIS — J45901 Unspecified asthma with (acute) exacerbation: Secondary | ICD-10-CM | POA: Diagnosis not present

## 2015-10-05 DIAGNOSIS — I10 Essential (primary) hypertension: Secondary | ICD-10-CM | POA: Diagnosis not present

## 2015-10-05 DIAGNOSIS — I480 Paroxysmal atrial fibrillation: Secondary | ICD-10-CM | POA: Diagnosis not present

## 2015-10-05 DIAGNOSIS — Z86711 Personal history of pulmonary embolism: Secondary | ICD-10-CM | POA: Insufficient documentation

## 2015-10-05 MED ORDER — PREDNISONE 20 MG PO TABS: 40.0000 mg | ORAL_TABLET | Freq: Every day | ORAL | Status: DC

## 2015-10-05 NOTE — Telephone Encounter (Signed)
lmtcb for pt.  

## 2015-10-05 NOTE — ED Provider Notes (Signed)
CSN: [id number]     Arrival date & time 10/05/15  26 History   First MD Initiated Contact with Patient 10/05/15 1104     Chief Complaint  Patient presents with  . Cough     (Consider location/radiation/quality/duration/timing/severity/associated sxs/prior Treatment) Patient is a 79 y.o. female presenting with cough. The history is provided by the patient.  Cough Cough characteristics:  Non-productive and hacking Severity:  Moderate Onset quality:  Gradual Duration:  2 weeks Timing:  Intermittent Progression:  Unchanged Chronicity:  Recurrent Smoker: no   Context: exposure to allergens and weather changes   Context: not sick contacts and not upper respiratory infection   Relieved by:  Beta-agonist inhaler, ipratropium inhaler, leukotriene antagonist and steroid inhaler (Improved with home meds but not relieved) Worsened by:  Activity and environmental changes Associated symptoms: rhinorrhea, shortness of breath and wheezing   Associated symptoms: no chest pain, no chills, no fever, no headaches, no myalgias and no sinus congestion   Associated symptoms comment:  Intermittent shortness of breath but nothing that is constant   Past Medical History  Diagnosis Date  . Allergic rhinitis   . Sarcoidosis ([place])   . Cardiac pacemaker in situ     DOI  08-04-2008  medtronic dual chamber  . Hyperlipidemia   . Shortness of breath     OCCASIONAL  . GERD (gastroesophageal reflux disease)   . History of pulmonary embolus (PE)   . Hypertension   . History of CVA (cerebrovascular accident)     09-15-2005  Left Cerebellar Hematoma with mild stem compression whild on coumadin for DVT  . Venous insufficiency   . History of DVT of lower extremity   . Anxiety   . Renal insufficiency   . VIN III (vulvar intraepithelial neoplasia III)   . Coronary artery disease CARDIOLOGIST-- DR Cristopher Peru    per cath-- Non-obstructive minimal CAD  . Chronic combined systolic and diastolic CHF, NYHA  class 2 ([place])     montior by cardiologist-- dr taylor--  ef 45%  . History of non-ST elevation myocardial infarction (NSTEMI)     2011-- in setting of bilateral PE's and DVT's and CHF  . PAF (paroxysmal atrial fibrillation) ([place])   . Mild pulmonary hypertension ([place])   . History of cervical dysplasia     CIN III  . Mild obstructive sleep apnea     PT USES Humidied Nasal Mask with O2 at2.5L (study 08-05-2009)  . Thrombophilia ([place])     hemotologist--  dr Marin Olp--  monitors pt anitcoagulation  . Chronic anticoagulation   . History of peptic ulcer disease   . OA (osteoarthritis)   . Moderate intermittent asthma   . LBBB (left bundle branch block)    Past Surgical History  Procedure Laterality Date  . Lumbar disc surgery  x2   1990  . Vulvar lesion removal  07/16/2012    Procedure: VULVAR LESION;  Surgeon: Imagene Gurney A. Alycia Rossetti, MD;  Location: [facility];  Service: Gynecology;  Laterality: Right;  Wide Local Excision of the Vulvar  . Total knee arthroplasty Left 04/21/2013    Procedure: LEFT TOTAL KNEE ARTHROPLASTY;  Surgeon: Gearlean Alf, MD;  Location: [facility];  Service: Orthopedics;  Laterality: Left;  . Cardiac pacemaker placement  08-04-2008  dr taylor gregg    for CHB--  MEDTRONIC DUAL CHAMBER  . Cholecystectomy  01/1996  . Knee arthroscopy Left   . Excision left supraclavicular lymph node   04-22-2007    granulomatous inflammation, extensive  .  Insertion of vena cava filter  08/ 2011  . Shoulder arthroscopy with rotator cuff repair and subacromial decompression Right 02-08-2009  . Deccompression ulnar nerve at cubital tunnel and resection muscle Left 03-17-2008  . Transthoracic echocardiogram  last one 05-19-2014    mild LVH/  ef 45-50%/  septal-lateral dyssynchroncy consistant with LBBB/  diffuse hypokinesis/  grade I diastolic dysfunction/  trivial MR and TR/  mild LAE  . Cardiac catheterization  08-26-2003   dr Lorenz Coaster. non-obstructive CAD & LVD with ef 46%, global HK, 2+MR, PA  press 50/24 w/ wedge 20  . Cardiac catheterization  08-03-2008   dr Angelena Form    Non-obstructive CAD/  30-40% pLAD,  30% ostial CFX/  symptomatic bradycardia/  normal renal arteries  . Abdominal hysterectomy  1970's  . Appendectomy  1966  . Vulvectomy N/A 02/19/2015    Procedure: SIMPLE PARTIAL VULVECTOMY;  Surgeon: [name], MD;  Location: [facility];  Service: Gynecology;  Laterality: N/A;   Family History  Problem Relation Age of Onset  . Heart attack Father   . Breast cancer Sister     mets  . Heart attack Brother   . Kidney failure Sister    Social History  Substance Use Topics  . Smoking status: Never Smoker   . Smokeless tobacco: Never Used     Comment: NEVER USED TOBACCO  . Alcohol Use: No   OB History    No data available     Review of Systems  Constitutional: Negative for fever and chills.  HENT: Positive for rhinorrhea.   Respiratory: Positive for cough, shortness of breath and wheezing.   Cardiovascular: Negative for chest pain.  Musculoskeletal: Negative for myalgias.  Neurological: Negative for headaches.  All other systems reviewed and are negative.     Allergies  Imitrex and Sulfa antibiotics  Home Medications   Prior to Admission medications   Medication Sig Start Date End Date Taking? Authorizing Provider  acetaminophen (TYLENOL) 325 MG tablet Take 650 mg by mouth every 6 (six) hours as needed (pain).   Yes Historical Provider, MD  cetirizine (ZYRTEC) 10 MG tablet Take 10 mg by mouth every morning.    Yes Historical Provider, MD  cholecalciferol (VITAMIN D) 1000 UNITS tablet Take 1,000 Units by mouth every morning.    Yes Historical Provider, MD  clorazepate (TRANXENE) 7.5 MG tablet Take 1 tablet (7.5 mg total) by mouth 3 (three) times daily. 08/02/15  Yes Biagio Borg, MD  fluticasone (FLONASE) 50 MCG/ACT nasal spray 1-2 puffs each nostril once daily Patient taking differently: Place 1 spray into both nostrils every morning. 1-2  puffs each nostril once daily 11/16/14  Yes Deneise Lever, MD  fluticasone (FLOVENT HFA) 44 MCG/ACT inhaler Inhale 2 puffs into the lungs 2 (two) times daily.   Yes Historical Provider, MD  folic acid (FOLVITE) 573 MCG tablet Take 400 mcg by mouth every morning.   Yes Historical Provider, MD  furosemide (LASIX) 40 MG tablet Take 1 tablet (40 mg total) by mouth daily. 06/22/15  Yes Biagio Borg, MD  ipratropium-albuterol (DUONEB) 0.5-2.5 (3) MG/3ML SOLN Take 3 mLs by nebulization 2 (two) times daily. 05/19/13  Yes Noralee Space, MD  metoprolol succinate (TOPROL-XL) 25 MG 24 hr tablet Take 1 tablet (25 mg total) by mouth daily before breakfast. 06/22/15  Yes Biagio Borg, MD  Multiple Vitamin (MULTIVITAMIN) tablet Take 1 tablet by mouth daily.   Yes Historical Provider, MD  Multiple Vitamins-Minerals (MULTIVITAMIN WITH MINERALS) tablet Take 1 tablet by mouth daily.   Yes Historical Provider, MD  pantoprazole (PROTONIX) 40 MG tablet Take 1 tablet (40 mg total) by mouth 2 (two) times daily. 09/08/15  Yes Biagio Borg, MD  Polyethyl Glycol-Propyl Glycol (SYSTANE) 0.4-0.3 % SOLN Place 2 drops into both eyes 4 (four) times daily.    Yes Historical Provider, MD  pravastatin (PRAVACHOL) 40 MG tablet Take 1 tablet (40 mg total) by mouth at bedtime. 11/24/14  Yes Biagio Borg, MD  sennosides-docusate sodium (SENOKOT-S) 8.6-50 MG tablet Take 2 tablets by mouth at bedtime.    Yes Historical Provider, MD  XARELTO 20 MG TABS tablet Take 1 tablet by mouth  daily 02/01/15  Yes Volanda Napoleon, MD  Azelastine HCl 0.15 % SOLN Place 1 spray into the nose 2 (two) times daily.    Historical Provider, MD  predniSONE (DELTASONE) 10 MG tablet Take 1 tablet (10 mg total) by mouth daily with breakfast. 06/03/15   Deneise Lever, MD   BP 178/82 mmHg  Pulse 79  Temp(Src) 97.9 F (36.6 C) (Oral)  Resp 20  Ht 5' 4.5" (1.638 m)  Wt 192 lb (87.091 kg)  BMI 32.46 kg/m2  SpO2 97% Physical Exam  Constitutional: She is oriented to  person, place, and time. She appears well-developed and well-nourished. No distress.  HENT:  Head: Normocephalic and atraumatic.  Right Ear: Tympanic membrane normal.  Left Ear: Tympanic membrane normal.  Nose: Mucosal edema present.  Mouth/Throat: Oropharynx is clear and moist.  Eyes: Conjunctivae and EOM are normal. Pupils are equal, round, and reactive to light.  Neck: Normal range of motion. Neck supple.  Cardiovascular: Normal rate, regular rhythm and intact distal pulses.   No murmur heard. Pulmonary/Chest: Effort normal and breath sounds normal. No respiratory distress. She has no wheezes. She has no rales.  Abdominal: Soft. She exhibits no distension. There is no tenderness. There is no rebound and no guarding.  Musculoskeletal: Normal range of motion. She exhibits no edema or tenderness.  Neurological: She is alert and oriented to person, place, and time.  Skin: Skin is warm and dry. No rash noted. No erythema.  Psychiatric: She has a normal mood and affect. Her behavior is normal.  Nursing note and vitals reviewed.   ED Course  Procedures (including critical care time) Labs Review Labs Reviewed - No data to display  Imaging Review Dg Chest 2 View  10/05/2015  CLINICAL DATA:  Cough for 2 weeks EXAM: CHEST  2 VIEW COMPARISON:  11/16/2014 FINDINGS: Cardiomediastinal silhouette is stable. Dual lead cardiac pacemaker lead unchanged in position. Degenerative changes bilateral shoulders. No acute infiltrate or pleural effusion. No pulmonary edema. Degenerative changes and osteopenia thoracic spine. IMPRESSION: No active cardiopulmonary disease. Electronically Signed   By: Lahoma Crocker M.D.   On: 10/05/2015 11:49   I have personally reviewed and evaluated these images and lab results as part of my medical decision-making.   EKG Interpretation None      MDM   Final diagnoses:  Asthma exacerbation    Patient with a history of sarcoidosis and asthma who currently is on  Flovent, albuterol, Flonase and Zyrtec who presents today with a persistent cough for the last 2 weeks that is nonproductive. She denies any infectious symptoms and states this occurs every year with weather changes. She denies persistent shortness of breath and states she still is using her oxygen at night. She is able to ambulate here and  currently her breath sounds are clear. She denies any abdominal discomfort or urinary complaints.  Patient is hypertensive here but otherwise vital signs are normal. Feel most likely patient is having exacerbation of her lung disease most likely due to frequent weather changes and allergens. Patient has plenty of her home medications but will add prednisone taper. Chest x-ray without evidence of pneumonia and no symptoms of bronchitis at this time    Blanchie Dessert, MD 10/05/15 1157

## 2015-10-05 NOTE — ED Notes (Addendum)
Patient states she has a two week history of non productive cough, which has been associated with a fever during the night time hours and fatigue.   States when she coughs she has pain and burning deep into her lungs.  History of sleep apnea and uses oxygen at night.

## 2015-10-05 NOTE — Discharge Instructions (Signed)
Bronchospasm, Adult  A bronchospasm is a spasm or tightening of the airways going into the lungs. During a bronchospasm breathing becomes more difficult because the airways get smaller. When this happens there can be coughing, a whistling sound when breathing (wheezing), and difficulty breathing. Bronchospasm is often associated with asthma, but not all patients who experience a bronchospasm have asthma.  CAUSES   A bronchospasm is caused by inflammation or irritation of the airways. The inflammation or irritation may be triggered by:   · Allergies (such as to animals, pollen, food, or mold). Allergens that cause bronchospasm may cause wheezing immediately after exposure or many hours later.    · Infection. Viral infections are believed to be the most common cause of bronchospasm.    · Exercise.    · Irritants (such as pollution, cigarette smoke, strong odors, aerosol sprays, and paint fumes).    · Weather changes. Winds increase molds and pollens in the air. Rain refreshes the air by washing irritants out. Cold air may cause inflammation.    · Stress and emotional upset.    SIGNS AND SYMPTOMS   · Wheezing.    · Excessive nighttime coughing.    · Frequent or severe coughing with a simple cold.    · Chest tightness.    · Shortness of breath.    DIAGNOSIS   Bronchospasm is usually diagnosed through a history and physical exam. Tests, such as chest X-rays, are sometimes done to look for other conditions.  TREATMENT   · Inhaled medicines can be given to open up your airways and help you breathe. The medicines can be given using either an inhaler or a nebulizer machine.  · Corticosteroid medicines may be given for severe bronchospasm, usually when it is associated with asthma.  HOME CARE INSTRUCTIONS   · Always have a plan prepared for seeking medical care. Know when to call your health care provider and local emergency services (911 in the [place] Know where you can access local emergency care.  · Only take medicines as  directed by your health care provider.  · If you were prescribed an inhaler or nebulizer machine, ask your health care provider to explain how to use it correctly. Always use a spacer with your inhaler if you were given one.  · It is necessary to remain calm during an attack. Try to relax and breathe more slowly.   · Control your home environment in the following ways:      Change your heating and air conditioning filter at least once a month.      Limit your use of fireplaces and wood stoves.    Do not smoke and do not allow smoking in your home.      Avoid exposure to perfumes and fragrances.      Get rid of pests (such as roaches and mice) and their droppings.      Throw away plants if you see mold on them.      Keep your house clean and dust free.      Replace carpet with wood, tile, or vinyl flooring. Carpet can trap dander and dust.      Use allergy-proof pillows, mattress covers, and box spring covers.      Wash bed sheets and blankets every week in hot water and dry them in a dryer.      Use blankets that are made of polyester or cotton.      Wash hands frequently.  SEEK MEDICAL CARE IF:   · You have muscle aches.    · You have chest pain.    · The sputum changes from clear or   white to yellow, green, gray, or bloody.    · The sputum you cough up gets thicker.    · There are problems that may be related to the medicine you are given, such as a rash, itching, swelling, or trouble breathing.    SEEK IMMEDIATE MEDICAL CARE IF:   · You have worsening wheezing and coughing even after taking your prescribed medicines.    · You have increased difficulty breathing.    · You develop severe chest pain.  MAKE SURE YOU:   · Understand these instructions.  · Will watch your condition.  · Will get help right away if you are not doing well or get worse.     This information is not intended to replace advice given to you by your health care provider. Make sure you discuss any questions you have with your health care  provider.     Document Released: 12/07/2003 Document Revised: 12/25/2014 Document Reviewed: 05/26/2013  Elsevier Interactive Patient Education ©2016 [employer]

## 2015-10-06 NOTE — Telephone Encounter (Signed)
LMTCB

## 2015-10-07 NOTE — Telephone Encounter (Signed)
lmtcb X4.  Will close per triage protocol.

## 2015-10-08 ENCOUNTER — Other Ambulatory Visit: Payer: Self-pay | Admitting: Hematology & Oncology

## 2015-10-16 ENCOUNTER — Other Ambulatory Visit: Payer: Self-pay | Admitting: Internal Medicine

## 2015-11-04 ENCOUNTER — Encounter: Payer: Self-pay | Admitting: Internal Medicine

## 2015-11-04 ENCOUNTER — Ambulatory Visit (INDEPENDENT_AMBULATORY_CARE_PROVIDER_SITE_OTHER): Payer: Medicare Other | Admitting: Internal Medicine

## 2015-11-04 VITALS — BP 146/84 | HR 87 | Temp 97.9°F | Ht 65.0 in | Wt 186.0 lb

## 2015-11-04 DIAGNOSIS — R7302 Impaired glucose tolerance (oral): Secondary | ICD-10-CM | POA: Diagnosis not present

## 2015-11-04 DIAGNOSIS — K219 Gastro-esophageal reflux disease without esophagitis: Secondary | ICD-10-CM | POA: Diagnosis not present

## 2015-11-04 DIAGNOSIS — E785 Hyperlipidemia, unspecified: Secondary | ICD-10-CM

## 2015-11-04 DIAGNOSIS — Z0189 Encounter for other specified special examinations: Secondary | ICD-10-CM | POA: Diagnosis not present

## 2015-11-04 DIAGNOSIS — Z0000 Encounter for general adult medical examination without abnormal findings: Secondary | ICD-10-CM

## 2015-11-04 DIAGNOSIS — R1013 Epigastric pain: Secondary | ICD-10-CM | POA: Insufficient documentation

## 2015-11-04 MED ORDER — AMOXICILLIN 500 MG PO CAPS: ORAL_CAPSULE | ORAL | Status: DC

## 2015-11-04 MED ORDER — CLORAZEPATE DIPOTASSIUM 7.5 MG PO TABS: 7.5000 mg | ORAL_TABLET | Freq: Three times a day (TID) | ORAL | Status: DC

## 2015-11-04 NOTE — Patient Instructions (Addendum)
Please continue all other medications as before, and refills have been done if requested.  Please have the pharmacy call with any other refills you may need.  Please continue your efforts at being more active, low cholesterol diet, and weight control.  You are otherwise up to date with prevention measures today.  Please keep your appointments with your specialists as you may have planned  You will be contacted regarding the referral for: GI  Please return in 6 months, or sooner if needed, with Lab testing done 3-5 days before

## 2015-11-04 NOTE — Progress Notes (Signed)
Subjective:    Patient ID: [name] [name], female    DOB: 1936/07/30, 79 y.o.   MRN: [id number]  HPI  Here to f/u; overall doing ok,  Pt denies chest pain, increasing sob or doe, wheezing, orthopnea, PND, increased LE swelling, palpitations, dizziness or syncope.  Pt denies new neurological symptoms such as new headache, or facial or extremity weakness or numbness.  Pt denies polydipsia, polyuria, or low sugar episode.   Pt denies new neurological symptoms such as new headache, or facial or extremity weakness or numbness.   Pt states overall good compliance with meds, mostly trying to follow appropriate diet, with wt overall stable,  but little exercise however.  Has worsening upper and occas lower abd pain, states reflux worse lately, and also with urgency of bowels assoc with LLQ pain then looser stools immediately. Past Medical History  Diagnosis Date  . Allergic rhinitis   . Sarcoidosis ([place])   . Cardiac pacemaker in situ     DOI  08-04-2008  medtronic dual chamber  . Hyperlipidemia   . Shortness of breath     OCCASIONAL  . GERD (gastroesophageal reflux disease)   . History of pulmonary embolus (PE)   . Hypertension   . History of CVA (cerebrovascular accident)     09-15-2005  Left Cerebellar Hematoma with mild stem compression whild on coumadin for DVT  . Venous insufficiency   . History of DVT of lower extremity   . Anxiety   . Renal insufficiency   . VIN III (vulvar intraepithelial neoplasia III)   . Coronary artery disease CARDIOLOGIST-- DR Cristopher Peru    per cath-- Non-obstructive minimal CAD  . Chronic combined systolic and diastolic CHF, NYHA class 2 ([place])     montior by cardiologist-- dr taylor--  ef 45%  . History of non-ST elevation myocardial infarction (NSTEMI)     2011-- in setting of bilateral PE's and DVT's and CHF  . PAF (paroxysmal atrial fibrillation) ([place])   . Mild pulmonary hypertension ([place])   . History of cervical dysplasia     CIN III  . Mild  obstructive sleep apnea     PT USES Humidied Nasal Mask with O2 at2.5L (study 08-05-2009)  . Thrombophilia ([place])     hemotologist--  dr Marin Olp--  monitors pt anitcoagulation  . Chronic anticoagulation   . History of peptic ulcer disease   . OA (osteoarthritis)   . Moderate intermittent asthma   . LBBB (left bundle branch block)    Past Surgical History  Procedure Laterality Date  . Lumbar disc surgery  x2   1990  . Vulvar lesion removal  07/16/2012    Procedure: VULVAR LESION;  Surgeon: Imagene Gurney A. Alycia Rossetti, MD;  Location: [facility];  Service: Gynecology;  Laterality: Right;  Wide Local Excision of the Vulvar  . Total knee arthroplasty Left 04/21/2013    Procedure: LEFT TOTAL KNEE ARTHROPLASTY;  Surgeon: Gearlean Alf, MD;  Location: [facility];  Service: Orthopedics;  Laterality: Left;  . Cardiac pacemaker placement  08-04-2008  dr taylor gregg    for CHB--  MEDTRONIC DUAL CHAMBER  . Cholecystectomy  01/1996  . Knee arthroscopy Left   . Excision left supraclavicular lymph node   04-22-2007    granulomatous inflammation, extensive  . Insertion of vena cava filter  08/ 2011  . Shoulder arthroscopy with rotator cuff repair and subacromial decompression Right 02-08-2009  . Deccompression ulnar nerve at cubital tunnel and resection muscle Left 03-17-2008  . Transthoracic  echocardiogram  last one 05-19-2014    mild LVH/  ef 45-50%/  septal-lateral dyssynchroncy consistant with LBBB/  diffuse hypokinesis/  grade I diastolic dysfunction/  trivial MR and TR/  mild LAE  . Cardiac catheterization  08-26-2003   dr Lorenz Coaster. non-obstructive CAD & LVD with ef 46%, global HK, 2+MR, PA press 50/24 w/ wedge 20  . Cardiac catheterization  08-03-2008   dr Angelena Form    Non-obstructive CAD/  30-40% pLAD,  30% ostial CFX/  symptomatic bradycardia/  normal renal arteries  . Abdominal hysterectomy  1970's  . Appendectomy  1966  . Vulvectomy N/A 02/19/2015    Procedure: SIMPLE PARTIAL VULVECTOMY;  Surgeon: [name], MD;  Location: [facility];  Service: Gynecology;  Laterality: N/A;    reports that she has never smoked. She has never used smokeless tobacco. She reports that she does not drink alcohol or use illicit drugs. family history includes Breast cancer in her sister; Heart attack in her brother and father; Kidney failure in her sister. Allergies  Allergen Reactions  . Imitrex [Sumatriptan] Other (See Comments)    Reaction unknown  . Sulfa Antibiotics Nausea Only    Doesn't like to drink lots of water.   Current Outpatient Prescriptions on File Prior to Visit  Medication Sig Dispense Refill  . acetaminophen (TYLENOL) 325 MG tablet Take 650 mg by mouth every 6 (six) hours as needed (pain).    . Azelastine HCl 0.15 % SOLN Place 1 spray into the nose 2 (two) times daily.    . cetirizine (ZYRTEC) 10 MG tablet Take 10 mg by mouth every morning.     . cholecalciferol (VITAMIN D) 1000 UNITS tablet Take 1,000 Units by mouth every morning.     . fluticasone (FLONASE) 50 MCG/ACT nasal spray 1-2 puffs each nostril once daily (Patient taking differently: Place 1 spray into both nostrils every morning. 1-2 puffs each nostril once daily) 48 g 3  . fluticasone (FLOVENT HFA) 44 MCG/ACT inhaler Inhale 2 puffs into the lungs 2 (two) times daily.    . folic acid (FOLVITE) A999333 MCG tablet Take 400 mcg by mouth every morning.    . furosemide (LASIX) 40 MG tablet Take 1 tablet (40 mg total) by mouth daily. 90 tablet 3  . ipratropium-albuterol (DUONEB) 0.5-2.5 (3) MG/3ML SOLN Take 3 mLs by nebulization 2 (two) times daily. 180 mL 5  . metoprolol succinate (TOPROL-XL) 25 MG 24 hr tablet Take 1 tablet (25 mg total) by mouth daily before breakfast. 90 tablet 3  . Multiple Vitamin (MULTIVITAMIN) tablet Take 1 tablet by mouth daily.    . Multiple Vitamins-Minerals (MULTIVITAMIN WITH MINERALS) tablet Take 1 tablet by mouth daily.    . pantoprazole (PROTONIX) 40 MG tablet Take 1 tablet (40 mg total) by  mouth 2 (two) times daily. 180 tablet 3  . Polyethyl Glycol-Propyl Glycol (SYSTANE) 0.4-0.3 % SOLN Place 2 drops into both eyes 4 (four) times daily.     . pravastatin (PRAVACHOL) 40 MG tablet Take 1 tablet by mouth at  bedtime 90 tablet 1  . predniSONE (DELTASONE) 20 MG tablet Take 2 tablets (40 mg total) by mouth daily. Take 2 tablets (40 mg total) by mouth for 4 days, then 1 tablet (20 mg total) by mouth for 3 days, then 0.5 tablets (10 mg total) by mouth for 3 days. 13 tablet 0  . sennosides-docusate sodium (SENOKOT-S) 8.6-50 MG tablet Take 2 tablets by mouth at bedtime.     [name] Kitchen  XARELTO 20 MG TABS tablet Take 1 tablet by mouth  daily 90 tablet 3   No current facility-administered medications on file prior to visit.   Review of Systems  Constitutional: Negative for unusual diaphoresis or night sweats HENT: Negative for ringing in ear or discharge Eyes: Negative for double vision or worsening visual disturbance.  Respiratory: Negative for choking and stridor.   Gastrointestinal: Negative for vomiting or other signifcant bowel change Genitourinary: Negative for hematuria or change in urine volume.  Musculoskeletal: Negative for other MSK pain or swelling Skin: Negative for color change and worsening wound.  Neurological: Negative for tremors and numbness other than noted  Psychiatric/Behavioral: Negative for decreased concentration or agitation other than above       Objective:   Physical Exam BP 146/84 mmHg  Pulse 87  Temp(Src) 97.9 F (36.6 C) (Oral)  Ht 5\' 5"  (1.651 m)  Wt 186 lb (84.369 kg)  BMI 30.95 kg/m2  SpO2 95% VS noted,  Constitutional: Pt appears in no significant distress HENT: Head: NCAT.  Right Ear: External ear normal.  Left Ear: External ear normal.  Eyes: . Pupils are equal, round, and reactive to light. Conjunctivae and EOM are normal Neck: Normal range of motion. Neck supple.  Cardiovascular: Normal rate and regular rhythm.   Pulmonary/Chest: Effort normal  and breath sounds without rales or wheezing.  Abd:  Soft, NT, ND, + BS Neurological: Pt is alert. Not confused , motor grossly intact Skin: Skin is warm. No rash, no LE edema Psychiatric: Pt behavior is normal. No agitation.     Assessment & Plan:

## 2015-11-04 NOTE — Progress Notes (Signed)
Pre visit review using our clinic review tool, if applicable. No additional management support is needed unless otherwise documented below in the visit note. 

## 2015-11-05 ENCOUNTER — Encounter: Payer: Self-pay | Admitting: Gastroenterology

## 2015-11-05 ENCOUNTER — Other Ambulatory Visit: Payer: Self-pay

## 2015-11-05 MED ORDER — MAGIC MOUTHWASH: 5.0000 mL | Freq: Three times a day (TID) | ORAL | Status: DC | PRN

## 2015-11-05 NOTE — Telephone Encounter (Signed)
Pt needs the magic mouth rx sent to [facility]. [facility] was not able to fill.   I did not see this med current on her med list.   Please advise.

## 2015-11-05 NOTE — Telephone Encounter (Signed)
rx done erx  There are several different formulas for magic mouthwash, but ok to use the typical one

## 2015-11-06 NOTE — Assessment & Plan Note (Signed)
stable overall by history and exam, recent data reviewed with pt, and pt to continue medical treatment as before,  to f/u any worsening symptoms or concerns Lab Results  Component Value Date   HGBA1C 5.6 05/04/2015

## 2015-11-06 NOTE — Assessment & Plan Note (Signed)
Uncontrolled, persistent pain despite current tx, for GI referral

## 2015-11-06 NOTE — Assessment & Plan Note (Signed)
?   Gastritis  Vs other, may need EGD,  to f/u any worsening symptoms or concerns

## 2015-11-06 NOTE — Assessment & Plan Note (Signed)
stable overall by history and exam, recent data reviewed with pt, and pt to continue medical treatment as before,  to f/u any worsening symptoms or concerns Lab Results  Component Value Date   LDLCALC 85 05/04/2015

## 2015-11-08 MED ORDER — MAGIC MOUTHWASH: 5.0000 mL | Freq: Three times a day (TID) | ORAL | Status: DC | PRN

## 2015-11-08 NOTE — Telephone Encounter (Signed)
erx done and sent to [facility]

## 2015-11-08 NOTE — Telephone Encounter (Signed)
rx called in

## 2015-11-24 ENCOUNTER — Ambulatory Visit (INDEPENDENT_AMBULATORY_CARE_PROVIDER_SITE_OTHER): Payer: Medicare Other | Admitting: Physician Assistant

## 2015-11-24 ENCOUNTER — Encounter: Payer: Self-pay | Admitting: Physician Assistant

## 2015-11-24 VITALS — BP 138/88 | HR 80 | Ht 64.5 in | Wt 187.2 lb

## 2015-11-24 DIAGNOSIS — K5730 Diverticulosis of large intestine without perforation or abscess without bleeding: Secondary | ICD-10-CM

## 2015-11-24 DIAGNOSIS — K580 Irritable bowel syndrome with diarrhea: Secondary | ICD-10-CM

## 2015-11-24 DIAGNOSIS — R6881 Early satiety: Secondary | ICD-10-CM

## 2015-11-24 DIAGNOSIS — R140 Abdominal distension (gaseous): Secondary | ICD-10-CM

## 2015-11-24 DIAGNOSIS — K219 Gastro-esophageal reflux disease without esophagitis: Secondary | ICD-10-CM

## 2015-11-24 MED ORDER — RANITIDINE HCL 300 MG PO TABS: 300.0000 mg | ORAL_TABLET | Freq: Every day | ORAL | Status: DC

## 2015-11-24 MED ORDER — RIFAXIMIN 550 MG PO TABS: 550.0000 mg | ORAL_TABLET | Freq: Three times a day (TID) | ORAL | Status: DC

## 2015-11-24 NOTE — Progress Notes (Signed)
Patient ID: IVADELL SARRATT, female   DOB: [birth year], 79 y.o.   MRN: [id number]     History of Present Illness: [name] [name] is a pleasant 79 year old female who was known to Dr. Fuller Plan. He has a history of an intermittent complete heart block, status post permanent pacemaker insertion, hypertension, dyslipidemia, and stroke. She has paroxysmal atrial fibrillation and is on Xarelto. She has a history of sarcoid, DVT/PE, asthmatic bronchitis, allergic rhinitis, cancer of the cervix. She wears oxygen 2.5 L at night with a humidifier on her oxygen mask. She was last evaluated by Dr. Fuller Plan in March 2015 for heme positive stools in a patient on Xarelto with multiple serious medical problems. It was felt she was extremely high risk for a colonoscopy. She was sent for a barium enema that revealed diverticular disease. She also has a long-standing history of GERD. She has been on twice a day pantoprazole but recently reports that she is having frequent breakthrough heartburn especially at night. She has been taking her pantoprazole after breakfast and with supper. She has no dysphagia. She does feel full after several bites and feels full for a long time. She has no nausea or vomiting. She denies dysphagia. She is moving her bowels but states it is erratic. She tends to be constipated and has been using Senokot 2 or 3 tablets at night. She will have occasional bouts of diarrhea after she takes the Senokot. She had diarrhea yesterday but had taken several tablets of Senokot the night before. Today she has not yet had a bowel movement. She did not take Senokot last night. She reports that for the past 2 months she has had excess gas, bloating, and flatulence. She states she passes so much gas that it is embarrassing. She has switched to Lactaid milk and it has not made much of a difference.   Past Medical History  Diagnosis Date  . Allergic rhinitis   . Sarcoidosis ([place])   . Cardiac pacemaker in situ     DOI   08-04-2008  medtronic dual chamber  . Hyperlipidemia   . Shortness of breath     OCCASIONAL  . GERD (gastroesophageal reflux disease)   . History of pulmonary embolus (PE)   . Hypertension   . History of CVA (cerebrovascular accident)     09-15-2005  Left Cerebellar Hematoma with mild stem compression whild on coumadin for DVT  . Venous insufficiency   . History of DVT of lower extremity   . Anxiety   . Renal insufficiency   . VIN III (vulvar intraepithelial neoplasia III)   . Coronary artery disease CARDIOLOGIST-- DR Cristopher Peru    per cath-- Non-obstructive minimal CAD  . Chronic combined systolic and diastolic CHF, NYHA class 2 ([place])     montior by cardiologist-- dr taylor--  ef 45%  . History of non-ST elevation myocardial infarction (NSTEMI)     2011-- in setting of bilateral PE's and DVT's and CHF  . PAF (paroxysmal atrial fibrillation) ([place])   . Mild pulmonary hypertension ([place])   . History of cervical dysplasia     CIN III  . Mild obstructive sleep apnea     PT USES Humidied Nasal Mask with O2 at2.5L (study 08-05-2009)  . Thrombophilia ([place])     hemotologist--  dr Marin Olp--  monitors pt anitcoagulation  . Chronic anticoagulation   . History of peptic ulcer disease   . OA (osteoarthritis)   . Moderate intermittent asthma   . LBBB (left bundle  branch block)     Past Surgical History  Procedure Laterality Date  . Lumbar disc surgery  x2   1990  . Vulvar lesion removal  07/16/2012    Procedure: VULVAR LESION;  Surgeon: Imagene Gurney A. Alycia Rossetti, MD;  Location: [facility];  Service: Gynecology;  Laterality: Right;  Wide Local Excision of the Vulvar  . Total knee arthroplasty Left 04/21/2013    Procedure: LEFT TOTAL KNEE ARTHROPLASTY;  Surgeon: Gearlean Alf, MD;  Location: [facility];  Service: Orthopedics;  Laterality: Left;  . Cardiac pacemaker placement  08-04-2008  dr taylor gregg    for CHB--  MEDTRONIC DUAL CHAMBER  . Cholecystectomy  01/1996  . Excision left supraclavicular lymph  node   04-22-2007    granulomatous inflammation, extensive  . Insertion of vena cava filter  08/ 2011  . Shoulder arthroscopy with rotator cuff repair and subacromial decompression Right 02-08-2009  . Deccompression ulnar nerve at cubital tunnel and resection muscle Left 03-17-2008  . Transthoracic echocardiogram  last one 05-19-2014    mild LVH/  ef 45-50%/  septal-lateral dyssynchroncy consistant with LBBB/  diffuse hypokinesis/  grade I diastolic dysfunction/  trivial MR and TR/  mild LAE  . Cardiac catheterization  08-26-2003   dr Lorenz Coaster. non-obstructive CAD & LVD with ef 46%, global HK, 2+MR, PA press 50/24 w/ wedge 20  . Cardiac catheterization  08-03-2008   dr Angelena Form    Non-obstructive CAD/  30-40% pLAD,  30% ostial CFX/  symptomatic bradycardia/  normal renal arteries  . Abdominal hysterectomy  1970's  . Appendectomy  1966  . Vulvectomy N/A 02/19/2015    Procedure: SIMPLE PARTIAL VULVECTOMY;  Surgeon: [name], MD;  Location[facility];  Service: Gynecology;  Laterality: N/A;   Family History  Problem Relation Age of Onset  . Heart attack Father   . Breast cancer Sister     mets  . Heart attack Brother   . Kidney failure Sister   . Colon cancer Neg Hx   . Esophageal cancer Neg Hx   . Pancreatic cancer Neg Hx   . Liver disease Neg Hx    Social History  Substance Use Topics  . Smoking status: Never Smoker   . Smokeless tobacco: Never Used     Comment: NEVER USED TOBACCO  . Alcohol Use: No   Current Outpatient Prescriptions  Medication Sig Dispense Refill  . acetaminophen (TYLENOL) 325 MG tablet Take 650 mg by mouth every 6 (six) hours as needed (pain).    [name] Kitchen amoxicillin (AMOXIL) 500 MG capsule Take 4 tabs by mouth 1-2 hours prior to dental work 20 capsule 0  . Azelastine HCl 0.15 % SOLN Place 1 spray into the nose 2 (two) times daily.    . cetirizine (ZYRTEC) 10 MG tablet Take 10 mg by mouth every morning.     . cholecalciferol (VITAMIN D) 1000  UNITS tablet Take 1,000 Units by mouth every morning.     . clorazepate (TRANXENE) 7.5 MG tablet Take 1 tablet (7.5 mg total) by mouth 3 (three) times daily. 270 tablet 3  . fluticasone (FLONASE) 50 MCG/ACT nasal spray 1-2 puffs each nostril once daily (Patient taking differently: Place 1 spray into both nostrils every morning. 1-2 puffs each nostril once daily) 48 g 3  . fluticasone (FLOVENT HFA) 44 MCG/ACT inhaler Inhale 2 puffs into the lungs 2 (two) times daily.    . folic acid (FOLVITE) A999333 MCG tablet Take 400 mcg by  mouth every morning.    . furosemide (LASIX) 40 MG tablet Take 1 tablet (40 mg total) by mouth daily. 90 tablet 3  . ipratropium-albuterol (DUONEB) 0.5-2.5 (3) MG/3ML SOLN Take 3 mLs by nebulization 2 (two) times daily. 180 mL 5  . metoprolol succinate (TOPROL-XL) 25 MG 24 hr tablet Take 1 tablet (25 mg total) by mouth daily before breakfast. 90 tablet 3  . Multiple Vitamin (MULTIVITAMIN) tablet Take 1 tablet by mouth daily.    . Multiple Vitamins-Minerals (MULTIVITAMIN WITH MINERALS) tablet Take 1 tablet by mouth daily.    . pantoprazole (PROTONIX) 40 MG tablet Take 1 tablet (40 mg total) by mouth 2 (two) times daily. 180 tablet 3  . Polyethyl Glycol-Propyl Glycol (SYSTANE) 0.4-0.3 % SOLN Place 2 drops into both eyes 4 (four) times daily.     . pravastatin (PRAVACHOL) 40 MG tablet Take 1 tablet by mouth at  bedtime 90 tablet 1  . predniSONE (DELTASONE) 20 MG tablet Take 2 tablets (40 mg total) by mouth daily. Take 2 tablets (40 mg total) by mouth for 4 days, then 1 tablet (20 mg total) by mouth for 3 days, then 0.5 tablets (10 mg total) by mouth for 3 days. 13 tablet 0  . sennosides-docusate sodium (SENOKOT-S) 8.6-50 MG tablet Take 2 tablets by mouth at bedtime.     Alveda Reasons 20 MG TABS tablet Take 1 tablet by mouth  daily 90 tablet 3  . magic mouthwash SOLN Take 5 mLs by mouth 3 (three) times daily as needed for mouth pain. (Patient not taking: Reported on 11/24/2015) 200 mL 2    No current facility-administered medications for this visit.   Allergies  Allergen Reactions  . Imitrex [Sumatriptan] Other (See Comments)    Reaction unknown  . Sulfa Antibiotics Nausea Only    Doesn't like to drink lots of water.     Review of Systems: Per history of present illness otherwise negative.   Physical Exam: BP 138/88 mmHg  Pulse 80  Ht 5' 4.5" (1.638 m)  Wt 187 lb 3.2 oz (84.913 kg)  BMI 31.65 kg/m2 General: Pleasant, well developed , Caucasian female in no acute distress Head: Normocephalic and atraumatic Eyes:  sclerae anicteric, conjunctiva pink  Ears: Normal auditory acuity Lungs: Clear throughout to auscultation Heart: Regular rate and rhythm Abdomen: Soft, non distended, non-tender. No masses, no hepatomegaly. Normal bowel sounds Musculoskeletal: Symmetrical with no gross deformities  Extremities: No edema  Neurological: Alert oriented x 4, grossly nonfocal Psychological:  Alert and cooperative. Normal mood and affect  Assessment and Recommendations: #1 GERD. An antireflux regimen has been reviewed. She has been instructed to use her pantoprazole 30 minutes before breakfast, 30 minutes before supper. She has been instructed to avoid eating for 2 hours before she retires in the evening. She will be given a trial of ranitidine 300 mg at at bedtime.  #2. Early satiety. This may be due to gastroparesis secondary to her diabetes. A gastric emptying scan will be obtained.  #3. Erratic bowel movements. Patient reports she is either constipated or has diarrhea. She has been instructed to use her Senokot only when she has not moved her bowels. For her excess gas and bloating, she will be given a trial of Xifaxan 550 mg 3 times daily for 14 days for possible small intestinal bacterial overgrowth. If the insurance will not cover Xifaxan, we will try Flagyl as an alternate.  She will follow up in 2 months, sooner if needed.  Brittany Amirault, Vita Barley PA-C  11/24/2015,

## 2015-11-24 NOTE — Progress Notes (Signed)
Reviewed and agree with management plan.  Malcolm T. Stark, MD FACG 

## 2015-11-24 NOTE — Patient Instructions (Addendum)
You have been scheduled for a gastric emptying scan at [facility] on 12-07-2015 at 7:30am. Please arrive at least 15 minutes prior to your appointment for registration. Please make certain not to have anything to eat or drink after midnight the night before your test. Hold all stomach medications (ex: Zofran, phenergan, Reglan) 48 hours prior to your test. If you need to reschedule your appointment, please contact radiology scheduling at [phone]. _____________________________________________________________________ A gastric-emptying study measures how long it takes for food to move through your stomach. There are several ways to measure stomach emptying. In the most common test, you eat food that contains a small amount of radioactive material. A scanner that detects the movement of the radioactive material is placed over your abdomen to monitor the rate at which food leaves your stomach. This test normally takes about 2 hours to complete. _____________________________________________________________________  We have sent the following medications to your pharmacy for you to pick up at your convenience: Zantac,Xifaxin    Take your Protonix 40mg  1 tablet 30 min before breakfast and 1 tablet 30 min before supper.  Use Senokot only when you have NOT had a bowel movement.  Please call the office to schedule a 6-8 week follow up with Dr Jerilynn Mages. Fuller Plan [phone]

## 2015-11-25 ENCOUNTER — Telehealth: Payer: Self-pay | Admitting: Physician Assistant

## 2015-11-25 ENCOUNTER — Telehealth: Payer: Self-pay

## 2015-11-25 NOTE — Telephone Encounter (Signed)
Per [facility], pt must try and fail therapy with Bentyl first. Please advise.

## 2015-11-25 NOTE — Telephone Encounter (Signed)
Spoke with patient and she states [facility] called her and answered her question.

## 2015-11-25 NOTE — Telephone Encounter (Signed)
I think the last message I sent you Vivien Rota, was for Bentyl.

## 2015-11-26 MED ORDER — METRONIDAZOLE 250 MG PO TABS: 250.0000 mg | ORAL_TABLET | Freq: Three times a day (TID) | ORAL | Status: DC

## 2015-11-26 NOTE — Telephone Encounter (Signed)
Per talking to Cecille Rubin Hvozdovic she has decided to put the pt on Flagyl 250mg  one 3 x a day for 14 days #42 0 rfs. Pt was notified and aware of the changes. [facility]

## 2015-12-06 ENCOUNTER — Encounter: Payer: Self-pay | Admitting: Gynecologic Oncology

## 2015-12-06 ENCOUNTER — Ambulatory Visit: Payer: Medicare Other | Attending: Gynecologic Oncology | Admitting: Gynecologic Oncology

## 2015-12-06 VITALS — BP 127/90 | HR 96 | Temp 97.8°F | Resp 18 | Ht 64.5 in | Wt 188.3 lb

## 2015-12-06 DIAGNOSIS — D071 Carcinoma in situ of vulva: Secondary | ICD-10-CM | POA: Insufficient documentation

## 2015-12-06 NOTE — Progress Notes (Signed)
POSTOP FOLLOWUP VISIT  HPI:  [name] [name] is a 79 y.o. year old initially seen in consultation on 02/10/15, referred by Dr Benjie Karvonen, for [facility].  She then underwent a partial simple posterior vulvectomy and distal vaginectomy on 0000000 without complications.  Her postoperative course was uncomplicated.  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  She is seen today for an ongoing surveillance.  She is feeling overall well. She denies any vulvar pruritis or irritation. She denies noting any new vulvar lesions. She has no discharge or bleeding.  Current Outpatient Prescriptions on File Prior to Visit  Medication Sig Dispense Refill  . acetaminophen (TYLENOL) 325 MG tablet Take 650 mg by mouth every 6 (six) hours as needed (pain).    . Azelastine HCl 0.15 % SOLN Place 1 spray into the nose 2 (two) times daily.    . cetirizine (ZYRTEC) 10 MG tablet Take 10 mg by mouth every morning.     . cholecalciferol (VITAMIN D) 1000 UNITS tablet Take 1,000 Units by mouth every morning.     . clorazepate (TRANXENE) 7.5 MG tablet Take 1 tablet (7.5 mg total) by mouth 3 (three) times daily. 270 tablet 3  . fluticasone (FLONASE) 50 MCG/ACT nasal spray 1-2 puffs each nostril once daily (Patient taking differently: Place 1 spray into both nostrils every morning. 1-2 puffs each nostril once daily) 48 g 3  . fluticasone (FLOVENT HFA) 44 MCG/ACT inhaler Inhale 2 puffs into the lungs 2 (two) times daily.    . folic acid (FOLVITE) A999333 MCG tablet Take 400 mcg by mouth every morning.    . furosemide (LASIX) 40 MG tablet Take 1 tablet (40 mg total) by mouth daily. 90 tablet 3  . ipratropium-albuterol (DUONEB) 0.5-2.5 (3) MG/3ML SOLN Take 3 mLs by nebulization 2 (two) times daily. 180 mL 5  . metoprolol succinate (TOPROL-XL) 25 MG 24 hr tablet Take 1 tablet (25 mg total) by mouth daily before breakfast. 90 tablet 3  . Multiple Vitamin (MULTIVITAMIN) tablet Take 1 tablet by mouth daily.    .  pantoprazole (PROTONIX) 40 MG tablet Take 1 tablet (40 mg total) by mouth 2 (two) times daily. 180 tablet 3  . Polyethyl Glycol-Propyl Glycol (SYSTANE) 0.4-0.3 % SOLN Place 2 drops into both eyes 4 (four) times daily.     . pravastatin (PRAVACHOL) 40 MG tablet Take 1 tablet by mouth at  bedtime 90 tablet 1  . ranitidine (ZANTAC) 300 MG tablet Take 1 tablet (300 mg total) by mouth at bedtime. 30 tablet 1  . rifaximin (XIFAXAN) 550 MG TABS tablet Take 1 tablet (550 mg total) by mouth 3 (three) times daily. 42 tablet 0  . sennosides-docusate sodium (SENOKOT-S) 8.6-50 MG tablet Take 2 tablets by mouth at bedtime.     Alveda Reasons 20 MG TABS tablet Take 1 tablet by mouth  daily 90 tablet 3  . amoxicillin (AMOXIL) 500 MG capsule Take 4 tabs by mouth 1-2 hours prior to dental work (Patient not taking: Reported on 12/06/2015) 20 capsule 0   No current facility-administered medications on file prior to visit.   Allergies  Allergen Reactions  . Imitrex [Sumatriptan] Other (See Comments)    Reaction unknown  . Sulfa Antibiotics Nausea Only    Doesn't like to drink lots of water.   Past Medical History  Diagnosis Date  . Allergic rhinitis   . Sarcoidosis ([place])   . Cardiac pacemaker in situ     DOI  08-04-2008  medtronic dual chamber  . Hyperlipidemia   . Shortness of breath     OCCASIONAL  . GERD (gastroesophageal reflux disease)   . History of pulmonary embolus (PE)   . Hypertension   . History of CVA (cerebrovascular accident)     09-15-2005  Left Cerebellar Hematoma with mild stem compression whild on coumadin for DVT  . Venous insufficiency   . History of DVT of lower extremity   . Anxiety   . Renal insufficiency   . VIN III (vulvar intraepithelial neoplasia III)   . Coronary artery disease CARDIOLOGIST-- DR Cristopher Peru    per cath-- Non-obstructive minimal CAD  . Chronic combined systolic and diastolic CHF, NYHA class 2 ([place])     montior by cardiologist-- dr taylor--  ef 45%  .  History of non-ST elevation myocardial infarction (NSTEMI)     2011-- in setting of bilateral PE's and DVT's and CHF  . PAF (paroxysmal atrial fibrillation) ([place])   . Mild pulmonary hypertension ([place])   . History of cervical dysplasia     CIN III  . Mild obstructive sleep apnea     PT USES Humidied Nasal Mask with O2 at2.5L (study 08-05-2009)  . Thrombophilia ([place])     hemotologist--  dr Marin Olp--  monitors pt anitcoagulation  . Chronic anticoagulation   . History of peptic ulcer disease   . OA (osteoarthritis)   . Moderate intermittent asthma   . LBBB (left bundle branch block)    Past Surgical History  Procedure Laterality Date  . Lumbar disc surgery  x2   1990  . Vulvar lesion removal  07/16/2012    Procedure: VULVAR LESION;  Surgeon: Imagene Gurney A. Alycia Rossetti, MD;  Location: [facility];  Service: Gynecology;  Laterality: Right;  Wide Local Excision of the Vulvar  . Total knee arthroplasty Left 04/21/2013    Procedure: LEFT TOTAL KNEE ARTHROPLASTY;  Surgeon: Gearlean Alf, MD;  Location: [facility];  Service: Orthopedics;  Laterality: Left;  . Cardiac pacemaker placement  08-04-2008  dr taylor gregg    for CHB--  MEDTRONIC DUAL CHAMBER  . Cholecystectomy  01/1996  . Excision left supraclavicular lymph node   04-22-2007    granulomatous inflammation, extensive  . Insertion of vena cava filter  08/ 2011  . Shoulder arthroscopy with rotator cuff repair and subacromial decompression Right 02-08-2009  . Deccompression ulnar nerve at cubital tunnel and resection muscle Left 03-17-2008  . Transthoracic echocardiogram  last one 05-19-2014    mild LVH/  ef 45-50%/  septal-lateral dyssynchroncy consistant with LBBB/  diffuse hypokinesis/  grade I diastolic dysfunction/  trivial MR and TR/  mild LAE  . Cardiac catheterization  08-26-2003   dr Lorenz Coaster. non-obstructive CAD & LVD with ef 46%, global HK, 2+MR, PA press 50/24 w/ wedge 20  . Cardiac catheterization  08-03-2008   dr Angelena Form     Non-obstructive CAD/  30-40% pLAD,  30% ostial CFX/  symptomatic bradycardia/  normal renal arteries  . Abdominal hysterectomy  1970's  . Appendectomy  1966  . Vulvectomy N/A 02/19/2015    Procedure: SIMPLE PARTIAL VULVECTOMY;  Surgeon: [name], MD;  Location: [facility];  Service: Gynecology;  Laterality: N/A;   Family History  Problem Relation Age of Onset  . Heart attack Father   . Breast cancer Sister     mets  . Heart attack Brother   . Kidney failure Sister   . Colon cancer Neg Hx   .  Esophageal cancer Neg Hx   . Pancreatic cancer Neg Hx   . Liver disease Neg Hx    Social History   Social History  . Marital Status: Widowed    Spouse Name: N/A  . Number of Children: 4  . Years of Education: N/A   Occupational History  . retired    Social History Main Topics  . Smoking status: Never Smoker   . Smokeless tobacco: Never Used     Comment: NEVER USED TOBACCO  . Alcohol Use: No  . Drug Use: No  . Sexual Activity: Not on file   Other Topics Concern  . Not on file   Social History Narrative     Review of systems: Constitutional:  She has no weight gain or weight loss. She has no fever or chills. Eyes: No blurred vision Ears, Nose, Mouth, Throat: No dizziness, headaches or changes in hearing. No mouth sores. Cardiovascular: No chest pain, palpitations or edema. Respiratory:  No shortness of breath, wheezing or cough Gastrointestinal: She has normal bowel movements without diarrhea or constipation. She denies any nausea or vomiting. She denies blood in her stool or heart burn. Genitourinary:  She denies pelvic pain, pelvic pressure or changes in her urinary function. She has no hematuria, dysuria, or incontinence. She has no irregular vaginal bleeding or vaginal discharge Musculoskeletal: Denies muscle weakness or joint pains.  Skin:  She has no skin changes, rashes or itching Neurological:  Denies dizziness or headaches. No neuropathy, no numbness or  tingling. Psychiatric:  She denies depression or anxiety. Hematologic/Lymphatic:   No easy bruising or bleeding   Physical Exam: Blood pressure 127/90, pulse 96, temperature 97.8 F (36.6 C), temperature source Oral, resp. rate 18, height 5' 4.5" (1.638 m), weight 188 lb 4.8 oz (85.412 kg), SpO2 96 %. General: Well dressed, well nourished in no apparent distress.   HEENT:  Wearing protective glasses Skin:  No lesions or rashes. Breasts: deferred. Lungs: deferred Cardiovascular: deferred Abdomen:  deferred  Genitourinary: vulvo-vaginal incision healed well, intact. No drainage or suppuration. No infection. With application of 3% acetic acid there is no evidence of dysplasia.  Extremities: No cyanosis, clubbing or edema.  No calf tenderness or erythema. No palpable cords. Psychiatric: Mood and affect are appropriate. Neurological: Awake, alert and oriented x 3. Sensation is intact, no neuropathy.  Musculoskeletal: No pain, normal strength and range of motion.  Assessment:    79 y.o. year old with VIN III.   S/p distal posterior vaginectomy and partial simple posterior vulvectomy on 02/19/15. The vaginal margin was focally microscopically positive. Free of disease on today's exam.  Plan: 1) Continue 6 monthly surveillance exams with Dr Benjie Karvonen or myself. 2) Treatment counseling - discussed the high risk for recurrence with VIN III particularly with a positive margin. Recommend surveillance checks with acetic acid application to vulva every 6 months. Recommend patient self inspection for new lesions with mirror every month. She was given the opportunity to ask questions, which were answered to her satisfaction, and she is agreement with the above mentioned plan of care.  Donaciano Eva, MD

## 2015-12-06 NOTE — Patient Instructions (Signed)
Follow up in six months or sooner if needed.

## 2015-12-07 ENCOUNTER — Encounter (HOSPITAL_COMMUNITY)
Admission: RE | Admit: 2015-12-07 | Discharge: 2015-12-07 | Disposition: A | Discharge status: Home / Self Care | Payer: Medicare Other | Source: Ambulatory Visit | Attending: Physician Assistant | Admitting: Physician Assistant

## 2015-12-07 ENCOUNTER — Ambulatory Visit (INDEPENDENT_AMBULATORY_CARE_PROVIDER_SITE_OTHER): Payer: Medicare Other | Admitting: *Deleted

## 2015-12-07 DIAGNOSIS — K580 Irritable bowel syndrome with diarrhea: Secondary | ICD-10-CM | POA: Insufficient documentation

## 2015-12-07 DIAGNOSIS — K5730 Diverticulosis of large intestine without perforation or abscess without bleeding: Secondary | ICD-10-CM | POA: Diagnosis present

## 2015-12-07 DIAGNOSIS — R6881 Early satiety: Secondary | ICD-10-CM | POA: Insufficient documentation

## 2015-12-07 DIAGNOSIS — I442 Atrioventricular block, complete: Secondary | ICD-10-CM | POA: Diagnosis not present

## 2015-12-07 DIAGNOSIS — K219 Gastro-esophageal reflux disease without esophagitis: Secondary | ICD-10-CM | POA: Insufficient documentation

## 2015-12-07 MED ORDER — TECHNETIUM TC 99M SULFUR COLLOID
2.1000 | Freq: Once | INTRAVENOUS | Status: AC | PRN
  Administered 2015-12-07: 2.1 via INTRAVENOUS

## 2015-12-07 NOTE — Progress Notes (Signed)
Remote pacemaker transmission.   

## 2015-12-09 ENCOUNTER — Ambulatory Visit: Payer: Medicare Other | Admitting: Internal Medicine

## 2015-12-09 ENCOUNTER — Encounter: Payer: Self-pay | Admitting: Internal Medicine

## 2015-12-09 VITALS — BP 140/82 | HR 101 | Ht 64.0 in | Wt 189.0 lb

## 2015-12-09 DIAGNOSIS — J310 Chronic rhinitis: Secondary | ICD-10-CM

## 2015-12-09 MED ORDER — MAGIC MOUTHWASH: ORAL | Status: AC

## 2015-12-09 MED ORDER — MAGIC MOUTHWASH: ORAL | Status: DC

## 2015-12-09 NOTE — Patient Instructions (Signed)
Script phoned in to [facility] for [place] to continue O2 during sleep

## 2015-12-09 NOTE — Progress Notes (Signed)
06/09/14- 77 yof never smoker referred courtesy of Dr Blair Heys for allergy evaluation.  Hx OSA- Dr Gwenette Greet. Dr Lenna Gilford has seen for pulmonary- hx sarcoid, DVT/PE, CAD/ Pacemaker, asthmatic bronchitis, allergic rhinitis, cancer cervix Patient complains of sinus congestion and morning rhinorrhea x30 years or more. Triggers include spring and fall pollens, weather changes. Blames drainage for hoarseness and sneezing. Wears oxygen 2.5 L at night with humidifier on her oxygen. Skin testing 10 years ago with allergy shots reported helpful x2 years. Now in the past 3-5 years she has had more problems with nasal pressure, congestion, sneezing and drainage. Chest tightness and dyspnea managed with nebulizer twice daily, Flovent 44. She says she has not been told she has "asthma". We briefly reviewed her complicated medical history. No ENT surgery. Family history-allergies, asthma, heart disease, clotting disorders. She lives alone, retired from office work. Does not recognize significant exposures in her home.  07/29/14- 32 yoF never smoker followed for allergic rhinitis, hx sarcoid, DVT/PE, CAD/ Pacemaker, Hx OSA- Dr Gwenette Greet.  hx sarcoid, DVT/PE, CAD/ Pacemaker, Wears oxygen 2.5 L at night with humidifier on her oxygen/ nasal mask Weather change blamed for hacking cough. Nasal O2  2.5/ L/ [employer]/ sleep-at night dries nose, made worse by Dymista. Liked how Dymista stopped post nasal drip and reduced cough. Magic mouthwash helps throat irritation and thus helps cough.  Allergy profile 06/09/14- Total IgE 3.4- negative for specific or total IgE elevations against common environmental allergens.  09/30/14-77 yoF never smoker followed for allergic rhinitis, hx sarcoid, DVT/PE, CAD/ Pacemaker, Hx OSA- Dr Gwenette Greet.  hx sarcoid, DVT/PE, CAD/ Pacemaker, Wears oxygen 2.5 L at night with humidifier on her oxygen/ nasal mask FOLLOW FOR: allergies, dry cough, SOB, Back pain today  11/16/14- 77 yoF never smoker followed for  allergic rhinitis, hx sarcoid, DVT/PE, CAD/ Pacemaker,Hx OSA- Dr Gwenette Greet. Wears oxygen 2.5 L/ [employer] at night with humidifier on her oxygen/ nasal mask ACUTE VISIT: cough, chest tightness in left side of chest(almost like a pulled muscle), SOB and wheezing. Denies any fevers. Has been around son that is sick as well. Caught cold from son. Sore L parasternal area, radiating through to scapula. Tender to touch. Not at pacemaker site.No fever or green.  03/31/15- 78 yoF never smoker followed for allergic rhinitis, hx sarcoid, DVT/PE, CAD/ Pacemaker,Hx OSA- Dr Gwenette Greet.   oxygen 2.5 L/ [employer] at night FOLLOWS FOR: Itchy, watery eyes, cough and congestion at this time. Dr Shanon Rosser Opth treating recurrent styes. Blames pollen rhinitis for sneezing treated with Flonase and azelastine nasal sprays. Continues home oxygen 2.5 L for sleep  12/09/2015-79 year old female never smoker followed for allergic rhinitis, history sarcoid, DVT/PE, CAD/pacemaker, history OSA O2 2.5 L/[employer] for sleep FOLLOWS FOR:  Still using 2.5 liters O2 at night; seems to be working fine, sleeps well. Pt states that she has been treated with 2 rounds  of Prednisone for recent allergy/asthma flare- completed last Prednisone in October 2016. CXR 10/05/2015 IMPRESSION: No active cardiopulmonary disease. Electronically Signed  By: Lahoma Crocker M.D.  On: 10/05/2015 11:49    ROS-see HPI Constitutional:   No-   weight loss, night sweats, fevers, chills, fatigue, lassitude. HEENT:   No-  headaches, difficulty swallowing, tooth/dental problems, sore throat,      + sneezing, itching, ear ache, +nasal congestion, +post nasal drip,  CV:  +chest pain, No-orthopnea, PND, swelling in lower extremities, anasarca,  dizziness, palpitations Resp: + shortness of breath with exertion or at rest.              No-   productive cough,  + non-productive cough,  No- coughing up of blood.               No-   change in color of mucus.  + wheezing.   Skin: No-   rash or lesions. GI:  No-   heartburn, indigestion, abdominal pain, nausea, vomiting,  GU:  MS:  No-   joint pain or swelling.  . Neuro-     nothing unusual Psych:  No- change in mood or affect. No depression or anxiety.  No memory loss.  OBJ- Physical Exam General- Alert, Oriented, Affect-appropriate, Distress- none acute Skin- rash-none, lesions- none, excoriation- none Lymphadenopathy- none Head- atraumatic            Eyes- Gross vision intact, PERRLA, conjunctivae and secretions clear, +stye R            Ears- Hearing, canals-normal            Nose- Clear, +Septal dev/ Narrow on the left, no-mucus, polyps, erosion, perforation             Throat- Mallampati II , mucosa clear , drainage- none, tonsils- atrophic Neck- flexible , trachea midline, no stridor , thyroid nl, carotid no bruit Chest - symmetrical excursion , unlabored           Heart/CV- RRR , no murmur , no gallop  , no rub, nl s1 s2                           - JVD- none , edema- none, stasis changes- none, varices- none           Lung- clear to P&A, wheeze- none, cough-+mild , dullness-none, rub- none           Chest wall- +L pacer,  Abd-  Br/ Gen/ Rectal- Not done, not indicated Extrem- cyanosis- none, clubbing, none, atrophy- none, strength- nl, + support stockings Neuro- grossly intact to observation

## 2015-12-10 ENCOUNTER — Encounter: Payer: Self-pay | Admitting: Internal Medicine

## 2015-12-10 ENCOUNTER — Encounter: Payer: Self-pay | Admitting: Cardiology

## 2015-12-10 LAB — CUP PACEART REMOTE DEVICE CHECK
Battery Impedance: 2842 Ohm
Battery Voltage: 2.71 V
Brady Statistic AP VP Percent: 15 %
Brady Statistic AP VS Percent: 0 %
Brady Statistic AS VP Percent: 61 %
Implantable Lead Implant Date: 20090818
Implantable Lead Location: 753859
Implantable Lead Location: 753860
Implantable Lead Model: 5076
Lead Channel Impedance Value: 381 Ohm
Lead Channel Impedance Value: 517 Ohm
Lead Channel Pacing Threshold Pulse Width: 0.4 ms
Lead Channel Sensing Intrinsic Amplitude: 2.8 mV
Lead Channel Setting Pacing Amplitude: 2 V
MDC IDC LEAD IMPLANT DT: 20090818
MDC IDC MSMT BATTERY REMAINING LONGEVITY: 20 mo
MDC IDC MSMT LEADCHNL RA PACING THRESHOLD AMPLITUDE: 0.5 V
MDC IDC MSMT LEADCHNL RV PACING THRESHOLD AMPLITUDE: 1.125 V
MDC IDC MSMT LEADCHNL RV PACING THRESHOLD PULSEWIDTH: 0.4 ms
MDC IDC MSMT LEADCHNL RV SENSING INTR AMPL: 8 mV
MDC IDC SESS DTM: 20161220172147
MDC IDC SET LEADCHNL RV PACING AMPLITUDE: 2.5 V
MDC IDC SET LEADCHNL RV PACING PULSEWIDTH: 0.4 ms
MDC IDC SET LEADCHNL RV SENSING SENSITIVITY: 4 mV
MDC IDC STAT BRADY AS VS PERCENT: 23 %

## 2015-12-13 ENCOUNTER — Emergency Department (HOSPITAL_BASED_OUTPATIENT_CLINIC_OR_DEPARTMENT_OTHER): Payer: Medicare Other

## 2015-12-13 ENCOUNTER — Encounter (HOSPITAL_BASED_OUTPATIENT_CLINIC_OR_DEPARTMENT_OTHER): Payer: Self-pay | Admitting: Emergency Medicine

## 2015-12-13 ENCOUNTER — Emergency Department (HOSPITAL_BASED_OUTPATIENT_CLINIC_OR_DEPARTMENT_OTHER)
Admission: EM | Admit: 2015-12-13 | Discharge: 2015-12-13 | Disposition: A | Discharge status: Home / Self Care | Payer: Medicare Other | Attending: Emergency Medicine | Admitting: Emergency Medicine

## 2015-12-13 DIAGNOSIS — I252 Old myocardial infarction: Secondary | ICD-10-CM | POA: Insufficient documentation

## 2015-12-13 DIAGNOSIS — Z87410 Personal history of cervical dysplasia: Secondary | ICD-10-CM | POA: Diagnosis not present

## 2015-12-13 DIAGNOSIS — I480 Paroxysmal atrial fibrillation: Secondary | ICD-10-CM | POA: Diagnosis not present

## 2015-12-13 DIAGNOSIS — Z79899 Other long term (current) drug therapy: Secondary | ICD-10-CM | POA: Diagnosis not present

## 2015-12-13 DIAGNOSIS — J410 Simple chronic bronchitis: Secondary | ICD-10-CM

## 2015-12-13 DIAGNOSIS — Z9889 Other specified postprocedural states: Secondary | ICD-10-CM | POA: Diagnosis not present

## 2015-12-13 DIAGNOSIS — Z862 Personal history of diseases of the blood and blood-forming organs and certain disorders involving the immune mechanism: Secondary | ICD-10-CM | POA: Insufficient documentation

## 2015-12-13 DIAGNOSIS — Z8711 Personal history of peptic ulcer disease: Secondary | ICD-10-CM | POA: Diagnosis not present

## 2015-12-13 DIAGNOSIS — G4733 Obstructive sleep apnea (adult) (pediatric): Secondary | ICD-10-CM | POA: Diagnosis not present

## 2015-12-13 DIAGNOSIS — R05 Cough: Secondary | ICD-10-CM | POA: Diagnosis present

## 2015-12-13 DIAGNOSIS — I10 Essential (primary) hypertension: Secondary | ICD-10-CM | POA: Insufficient documentation

## 2015-12-13 DIAGNOSIS — I2510 Atherosclerotic heart disease of native coronary artery without angina pectoris: Secondary | ICD-10-CM | POA: Insufficient documentation

## 2015-12-13 DIAGNOSIS — Z8742 Personal history of other diseases of the female genital tract: Secondary | ICD-10-CM | POA: Diagnosis not present

## 2015-12-13 DIAGNOSIS — Z950 Presence of cardiac pacemaker: Secondary | ICD-10-CM | POA: Insufficient documentation

## 2015-12-13 DIAGNOSIS — Z792 Long term (current) use of antibiotics: Secondary | ICD-10-CM | POA: Insufficient documentation

## 2015-12-13 DIAGNOSIS — Z8673 Personal history of transient ischemic attack (TIA), and cerebral infarction without residual deficits: Secondary | ICD-10-CM | POA: Diagnosis not present

## 2015-12-13 DIAGNOSIS — Z7951 Long term (current) use of inhaled steroids: Secondary | ICD-10-CM | POA: Insufficient documentation

## 2015-12-13 DIAGNOSIS — I5042 Chronic combined systolic (congestive) and diastolic (congestive) heart failure: Secondary | ICD-10-CM | POA: Insufficient documentation

## 2015-12-13 DIAGNOSIS — Z7901 Long term (current) use of anticoagulants: Secondary | ICD-10-CM | POA: Insufficient documentation

## 2015-12-13 DIAGNOSIS — Z8739 Personal history of other diseases of the musculoskeletal system and connective tissue: Secondary | ICD-10-CM | POA: Insufficient documentation

## 2015-12-13 DIAGNOSIS — E785 Hyperlipidemia, unspecified: Secondary | ICD-10-CM | POA: Diagnosis not present

## 2015-12-13 DIAGNOSIS — F419 Anxiety disorder, unspecified: Secondary | ICD-10-CM | POA: Insufficient documentation

## 2015-12-13 DIAGNOSIS — J45901 Unspecified asthma with (acute) exacerbation: Secondary | ICD-10-CM | POA: Diagnosis not present

## 2015-12-13 DIAGNOSIS — Z86718 Personal history of other venous thrombosis and embolism: Secondary | ICD-10-CM | POA: Diagnosis not present

## 2015-12-13 DIAGNOSIS — Z86711 Personal history of pulmonary embolism: Secondary | ICD-10-CM | POA: Diagnosis not present

## 2015-12-13 DIAGNOSIS — Z87412 Personal history of vulvar dysplasia: Secondary | ICD-10-CM | POA: Insufficient documentation

## 2015-12-13 DIAGNOSIS — K219 Gastro-esophageal reflux disease without esophagitis: Secondary | ICD-10-CM | POA: Diagnosis not present

## 2015-12-13 MED ORDER — BENZONATATE 100 MG PO CAPS: 100.0000 mg | ORAL_CAPSULE | Freq: Three times a day (TID) | ORAL | Status: DC

## 2015-12-13 NOTE — ED Notes (Signed)
Patient transported to X-ray 

## 2015-12-13 NOTE — ED Notes (Signed)
Pt reports developing cough on Friday that has progressively worsened, presents today with increasing non productive after two nebulizers, pt with history of lung disease, saw pulmonologist on Thursday, at that time she had no cough

## 2015-12-13 NOTE — ED Provider Notes (Signed)
CSN: [id number]     Arrival date & time 12/13/15  0802 History   First MD Initiated Contact with Patient 12/13/15 0805     Chief Complaint  Patient presents with  . Cough     (Consider location/radiation/quality/duration/timing/severity/associated sxs/prior Treatment) Patient is a 79 y.o. female presenting with cough. The history is provided by the patient.  Cough Cough characteristics:  Non-productive Severity:  Moderate Onset quality:  Gradual Duration:  2 days Timing:  Constant Progression:  Unchanged Chronicity:  Chronic Smoker: no   Context: weather changes   Context: not sick contacts and not upper respiratory infection   Relieved by:  Nothing Worsened by:  Environmental changes Ineffective treatments:  Home nebulizer Associated symptoms: wheezing ("periodically")   Associated symptoms: no fever     Past Medical History  Diagnosis Date  . Allergic rhinitis   . Sarcoidosis ([place])   . Cardiac pacemaker in situ     DOI  08-04-2008  medtronic dual chamber  . Hyperlipidemia   . Shortness of breath     OCCASIONAL  . GERD (gastroesophageal reflux disease)   . History of pulmonary embolus (PE)   . Hypertension   . History of CVA (cerebrovascular accident)     09-15-2005  Left Cerebellar Hematoma with mild stem compression whild on coumadin for DVT  . Venous insufficiency   . History of DVT of lower extremity   . Anxiety   . Renal insufficiency   . VIN III (vulvar intraepithelial neoplasia III)   . Coronary artery disease CARDIOLOGIST-- DR Cristopher Peru    per cath-- Non-obstructive minimal CAD  . Chronic combined systolic and diastolic CHF, NYHA class 2 ([place])     montior by cardiologist-- dr taylor--  ef 45%  . History of non-ST elevation myocardial infarction (NSTEMI)     2011-- in setting of bilateral PE's and DVT's and CHF  . PAF (paroxysmal atrial fibrillation) ([place])   . Mild pulmonary hypertension ([place])   . History of cervical dysplasia     CIN III  . Mild  obstructive sleep apnea     PT USES Humidied Nasal Mask with O2 at2.5L (study 08-05-2009)  . Thrombophilia ([place])     hemotologist--  dr Marin Olp--  monitors pt anitcoagulation  . Chronic anticoagulation   . History of peptic ulcer disease   . OA (osteoarthritis)   . Moderate intermittent asthma   . LBBB (left bundle branch block)    Past Surgical History  Procedure Laterality Date  . Lumbar disc surgery  x2   1990  . Vulvar lesion removal  07/16/2012    Procedure: VULVAR LESION;  Surgeon: Imagene Gurney A. Alycia Rossetti, MD;  Location: [facility];  Service: Gynecology;  Laterality: Right;  Wide Local Excision of the Vulvar  . Total knee arthroplasty Left 04/21/2013    Procedure: LEFT TOTAL KNEE ARTHROPLASTY;  Surgeon: Gearlean Alf, MD;  Location: [facility];  Service: Orthopedics;  Laterality: Left;  . Cardiac pacemaker placement  08-04-2008  dr taylor gregg    for CHB--  MEDTRONIC DUAL CHAMBER  . Cholecystectomy  01/1996  . Excision left supraclavicular lymph node   04-22-2007    granulomatous inflammation, extensive  . Insertion of vena cava filter  08/ 2011  . Shoulder arthroscopy with rotator cuff repair and subacromial decompression Right 02-08-2009  . Deccompression ulnar nerve at cubital tunnel and resection muscle Left 03-17-2008  . Transthoracic echocardiogram  last one 05-19-2014    mild LVH/  ef 45-50%/  septal-lateral dyssynchroncy  consistant with LBBB/  diffuse hypokinesis/  grade I diastolic dysfunction/  trivial MR and TR/  mild LAE  . Cardiac catheterization  08-26-2003   dr Lorenz Coaster. non-obstructive CAD & LVD with ef 46%, global HK, 2+MR, PA press 50/24 w/ wedge 20  . Cardiac catheterization  08-03-2008   dr Angelena Form    Non-obstructive CAD/  30-40% pLAD,  30% ostial CFX/  symptomatic bradycardia/  normal renal arteries  . Abdominal hysterectomy  1970's  . Appendectomy  1966  . Vulvectomy N/A 02/19/2015    Procedure: SIMPLE PARTIAL VULVECTOMY;  Surgeon: [name], MD;  Location: [facility];  Service: Gynecology;  Laterality: N/A;   Family History  Problem Relation Age of Onset  . Heart attack Father   . Breast cancer Sister     mets  . Heart attack Brother   . Kidney failure Sister   . Colon cancer Neg Hx   . Esophageal cancer Neg Hx   . Pancreatic cancer Neg Hx   . Liver disease Neg Hx    Social History  Substance Use Topics  . Smoking status: Never Smoker   . Smokeless tobacco: Never Used     Comment: NEVER USED TOBACCO  . Alcohol Use: No   OB History    No data available     Review of Systems  Constitutional: Negative for fever.  Respiratory: Positive for cough and wheezing ("periodically").   All other systems reviewed and are negative.     Allergies  Imitrex and Sulfa antibiotics  Home Medications   Prior to Admission medications   Medication Sig Start Date End Date Taking? Authorizing Provider  clorazepate (TRANXENE) 7.5 MG tablet Take 1 tablet (7.5 mg total) by mouth 3 (three) times daily. 11/04/15  Yes Biagio Borg, MD  fluticasone (FLONASE) 50 MCG/ACT nasal spray 1-2 puffs each nostril once daily Patient taking differently: Place 1 spray into both nostrils every morning. 1-2 puffs each nostril once daily 11/16/14  Yes Deneise Lever, MD  furosemide (LASIX) 40 MG tablet Take 1 tablet (40 mg total) by mouth daily. 06/22/15  Yes Biagio Borg, MD  ipratropium-albuterol (DUONEB) 0.5-2.5 (3) MG/3ML SOLN Take 3 mLs by nebulization 2 (two) times daily. 05/19/13  Yes Noralee Space, MD  metoprolol succinate (TOPROL-XL) 25 MG 24 hr tablet Take 1 tablet (25 mg total) by mouth daily before breakfast. 06/22/15  Yes Biagio Borg, MD  pantoprazole (PROTONIX) 40 MG tablet Take 1 tablet (40 mg total) by mouth 2 (two) times daily. 09/08/15  Yes Biagio Borg, MD  pravastatin (PRAVACHOL) 40 MG tablet Take 1 tablet by mouth at  bedtime 10/18/15  Yes Biagio Borg, MD  ranitidine (ZANTAC) 300 MG tablet Take 1 tablet (300 mg total) by mouth at bedtime.  11/24/15  Yes Lori P Hvozdovic, PA-C  rifaximin (XIFAXAN) 550 MG TABS tablet Take 1 tablet (550 mg total) by mouth 3 (three) times daily. 11/24/15  Yes Lori P Hvozdovic, PA-C  XARELTO 20 MG TABS tablet Take 1 tablet by mouth  daily 10/08/15  Yes Volanda Napoleon, MD  acetaminophen (TYLENOL) 325 MG tablet Take 650 mg by mouth every 6 (six) hours as needed (pain).    Historical Provider, MD  Azelastine HCl 0.15 % SOLN Place 1 spray into the nose 2 (two) times daily.    Historical Provider, MD  cetirizine (ZYRTEC) 10 MG tablet Take 10 mg by mouth every morning.  Historical Provider, MD  cholecalciferol (VITAMIN D) 1000 UNITS tablet Take 1,000 Units by mouth every morning.     Historical Provider, MD  fluticasone (FLOVENT HFA) 44 MCG/ACT inhaler Inhale 2 puffs into the lungs 2 (two) times daily.    Historical Provider, MD  folic acid (FOLVITE) A999333 MCG tablet Take 400 mcg by mouth every morning.    Historical Provider, MD  magic mouthwash SOLN Swish and swallow 5 ml 4 times daily if needed 12/09/15   Deneise Lever, MD  Multiple Vitamin (MULTIVITAMIN) tablet Take 1 tablet by mouth daily.    Historical Provider, MD  omeprazole (PRILOSEC) 20 MG capsule Take 20 mg by mouth daily.    Historical Provider, MD  Polyethyl Glycol-Propyl Glycol (SYSTANE) 0.4-0.3 % SOLN Place 2 drops into both eyes 4 (four) times daily.     Historical Provider, MD  sennosides-docusate sodium (SENOKOT-S) 8.6-50 MG tablet Take 2 tablets by mouth at bedtime.     Historical Provider, MD   BP 156/88 mmHg  Pulse 105  Temp(Src) 98.3 F (36.8 C) (Oral)  Resp 24  Ht 5\' 4"  (1.626 m)  Wt 188 lb (85.276 kg)  BMI 32.25 kg/m2  SpO2 97% Physical Exam  Constitutional: She is oriented to person, place, and time. She appears well-developed and well-nourished. No distress.  HENT:  Head: Normocephalic.  Eyes: Conjunctivae are normal.  Neck: Neck supple. No tracheal deviation present.  Cardiovascular: Normal rate, regular rhythm and  normal heart sounds.   Pulmonary/Chest: Effort normal. No respiratory distress. She has no wheezes. She has no rales. She exhibits no tenderness.  Coarse sounds with coughing  Abdominal: Soft. She exhibits no distension. There is no tenderness.  Neurological: She is alert and oriented to person, place, and time.  Skin: Skin is warm and dry.  Psychiatric: She has a normal mood and affect.    ED Course  Procedures (including critical care time) Labs Review Labs Reviewed - No data to display  Imaging Review Dg Chest 2 View  12/13/2015  CLINICAL DATA:  Cough since Friday EXAM: CHEST  2 VIEW COMPARISON:  October 05, 2015 FINDINGS: The heart size and mediastinal contours are stable. Cardiac pacemaker is unchanged. There is mild chronic scar of the right lung base. There is no focal infiltrate, pulmonary edema, or pleural effusion. The visualized skeletal structures are stable. IMPRESSION: No active cardiopulmonary disease. Electronically Signed   By: Abelardo Diesel M.D.   On: 12/13/2015 08:51   I have personally reviewed and evaluated these images and lab results as part of my medical decision-making.   EKG Interpretation None      MDM   Final diagnoses:  Simple chronic bronchitis ([place])    79 year old female with history of obstructive sleep apnea, allergic rhinitis, sarcoidosis presents with acute on chronic cough over the last 2 days. No evidence of pneumonia on chest x-ray, no acute wheezing here, no fever and otherwise stable vital signs, performed home nebulizers prior to arrival which is likely cause of tachycardia in absence of other explanation. On Xarelto for history of PE with no acute chest pain or shortness of breath accompanying symptoms. Plan to follow up with PCP as needed and return precautions discussed for worsening or new concerning symptoms.     Leo Grosser, MD 12/13/15 [phone]

## 2015-12-13 NOTE — Discharge Instructions (Signed)
Chronic Bronchitis Chronic bronchitis is a lasting inflammation of the bronchial tubes, which are the tubes that carry air into your lungs. This is inflammation that occurs:   On most days of the week.   For at least three months at a time.   Over a period of two years in a row. When the bronchial tubes are inflamed, they start to produce mucus. The inflammation and buildup of mucus make it more difficult to breathe. Chronic bronchitis is usually a permanent problem and is one type of chronic obstructive pulmonary disease (COPD). People with chronic bronchitis are at greater risk for getting repeated colds, or respiratory infections. CAUSES  Chronic bronchitis most often occurs in people who have:  Long-standing, severe asthma.  A history of smoking.  Asthma and who also smoke. SIGNS AND SYMPTOMS  Chronic bronchitis may cause the following:   A cough that brings up mucus (productive cough).  Shortness of breath.  Early morning headache.  Wheezing.  Chest discomfort.   Recurring respiratory infections. DIAGNOSIS  Your health care provider may confirm the diagnosis by:  Taking your medical history.  Performing a physical exam.  Taking a chest X-ray.   Performing pulmonary function tests. TREATMENT  Treatment involves controlling symptoms with medicines, oxygen therapy, or making lifestyle changes, such as exercising and eating a healthy, well-balanced diet. Medicines could include:  Inhalers to improve air flow in and out of your lungs.  Antibiotics to treat bacterial infections, such as pneumonia, sinus infections, and acute bronchitis. As a preventative measure, your health care provider may recommend routine vaccinations for influenza and pneumonia. This is to prevent infection and hospitalization since you may be more at risk for these types of infections.  HOME CARE INSTRUCTIONS  Take medicines only as directed by your health care provider.   If you smoke  cigarettes, chew tobacco, or use electronic cigarettes, quit. If you need help quitting, ask your health care provider.  Avoid pollen, dust, animal dander, molds, smoke, and other things that cause shortness of breath or wheezing attacks.  Talk to your health care provider about possible exercise routines. Regular exercise is very important to help you feel better.  If you are prescribed oxygen use at home follow these guidelines:  Never smoke while using oxygen. Oxygen does not burn or explode, but flammable materials will burn faster in the presence of oxygen.  Keep a fire extinguisher close by. Let your fire department know that you have oxygen in your home.  Warn visitors not to smoke near you when you are using oxygen. Put up "no smoking" signs in your home where you most often use the oxygen.  Regularly test your smoke detectors at home to make sure they work. If you receive care in your home from a nurse or other health care provider, he or she may also check to make sure your smoke detectors work.  Ask your health care provider whether you would benefit from a pulmonary rehabilitation program.  Do not wait to get medical care if you have any concerning symptoms. Delays could cause permanent injury and may be life threatening. SEEK MEDICAL CARE IF:  You have increased coughing or shortness of breath or both.  You have muscle aches.  You have chest pain.  Your mucus gets thicker.  Your mucus changes from clear or white to yellow, green, gray, or bloody. SEEK IMMEDIATE MEDICAL CARE IF:  Your usual medicines do not stop your wheezing.   You have increased difficulty breathing.     You have any problems with the medicine you are taking, such as a rash, itching, swelling, or trouble breathing. MAKE SURE YOU:   Understand these instructions.  Will watch your condition.  Will get help right away if you are not doing well or get worse.   This information is not intended to  replace advice given to you by your health care provider. Make sure you discuss any questions you have with your health care provider.   Document Released: 09/21/2006 Document Revised: 12/25/2014 Document Reviewed: 01/12/2014 Elsevier Interactive Patient Education 2016 [employer] Inc[employer]  

## 2015-12-15 ENCOUNTER — Ambulatory Visit (INDEPENDENT_AMBULATORY_CARE_PROVIDER_SITE_OTHER): Payer: Medicare Other | Admitting: Internal Medicine

## 2015-12-15 ENCOUNTER — Encounter: Payer: Self-pay | Admitting: Internal Medicine

## 2015-12-15 VITALS — BP 126/82 | HR 95 | Temp 97.8°F | Ht 65.0 in | Wt 188.0 lb

## 2015-12-15 DIAGNOSIS — R05 Cough: Secondary | ICD-10-CM

## 2015-12-15 DIAGNOSIS — R059 Cough, unspecified: Secondary | ICD-10-CM | POA: Insufficient documentation

## 2015-12-15 DIAGNOSIS — F411 Generalized anxiety disorder: Secondary | ICD-10-CM

## 2015-12-15 DIAGNOSIS — I10 Essential (primary) hypertension: Secondary | ICD-10-CM | POA: Diagnosis not present

## 2015-12-15 NOTE — Progress Notes (Signed)
Pre visit review using our clinic review tool, if applicable. No additional management support is needed unless otherwise documented below in the visit note. 

## 2015-12-15 NOTE — Progress Notes (Signed)
Subjective:    Patient ID: [name] [name], female    DOB: [birth year], 79 y.o.   MRN: [id number]  HPI  Here with acute onset mild to mod 3-4 days ST, HA, general weakness and malaise, with prod cough clearish sputum, but Pt denies chest pain, increased sob or doe, wheezing, orthopnea, PND, increased LE swelling, palpitations, dizziness or syncope, and overall seems improving to her with less severity of her symptoms in the last day. Pt denies fever, wt loss, night sweats, loss of appetite, or other constitutional symptoms  Pt denies new neurological symptoms such as new headache, or facial or extremity weakness or numbness   Pt denies polydipsia, polyuria  Denies worsening depressive symptoms, suicidal ideation, or panic; has ongoing anxiety Past Medical History  Diagnosis Date  . Allergic rhinitis   . Sarcoidosis ([place])   . Cardiac pacemaker in situ     DOI  08-04-2008  medtronic dual chamber  . Hyperlipidemia   . Shortness of breath     OCCASIONAL  . GERD (gastroesophageal reflux disease)   . History of pulmonary embolus (PE)   . Hypertension   . History of CVA (cerebrovascular accident)     09-15-2005  Left Cerebellar Hematoma with mild stem compression whild on coumadin for DVT  . Venous insufficiency   . History of DVT of lower extremity   . Anxiety   . Renal insufficiency   . VIN III (vulvar intraepithelial neoplasia III)   . Coronary artery disease CARDIOLOGIST-- DR Cristopher Peru    per cath-- Non-obstructive minimal CAD  . Chronic combined systolic and diastolic CHF, NYHA class 2 ([place])     montior by cardiologist-- dr taylor--  ef 45%  . History of non-ST elevation myocardial infarction (NSTEMI)     2011-- in setting of bilateral PE's and DVT's and CHF  . PAF (paroxysmal atrial fibrillation) ([place])   . Mild pulmonary hypertension ([place])   . History of cervical dysplasia     CIN III  . Mild obstructive sleep apnea     PT USES Humidied Nasal Mask with O2 at2.5L (study  08-05-2009)  . Thrombophilia ([place])     hemotologist--  dr Marin Olp--  monitors pt anitcoagulation  . Chronic anticoagulation   . History of peptic ulcer disease   . OA (osteoarthritis)   . Moderate intermittent asthma   . LBBB (left bundle branch block)    Past Surgical History  Procedure Laterality Date  . Lumbar disc surgery  x2   1990  . Vulvar lesion removal  07/16/2012    Procedure: VULVAR LESION;  Surgeon: Imagene Gurney A. Alycia Rossetti, MD;  Location: [facility];  Service: Gynecology;  Laterality: Right;  Wide Local Excision of the Vulvar  . Total knee arthroplasty Left 04/21/2013    Procedure: LEFT TOTAL KNEE ARTHROPLASTY;  Surgeon: Gearlean Alf, MD;  Location: [facility];  Service: Orthopedics;  Laterality: Left;  . Cardiac pacemaker placement  08-04-2008  dr taylor gregg    for CHB--  MEDTRONIC DUAL CHAMBER  . Cholecystectomy  01/1996  . Excision left supraclavicular lymph node   04-22-2007    granulomatous inflammation, extensive  . Insertion of vena cava filter  08/ 2011  . Shoulder arthroscopy with rotator cuff repair and subacromial decompression Right 02-08-2009  . Deccompression ulnar nerve at cubital tunnel and resection muscle Left 03-17-2008  . Transthoracic echocardiogram  last one 05-19-2014    mild LVH/  ef 45-50%/  septal-lateral dyssynchroncy consistant with LBBB/  diffuse hypokinesis/  grade I diastolic dysfunction/  trivial MR and TR/  mild LAE  . Cardiac catheterization  08-26-2003   dr Lorenz Coaster. non-obstructive CAD & LVD with ef 46%, global HK, 2+MR, PA press 50/24 w/ wedge 20  . Cardiac catheterization  08-03-2008   dr Angelena Form    Non-obstructive CAD/  30-40% pLAD,  30% ostial CFX/  symptomatic bradycardia/  normal renal arteries  . Abdominal hysterectomy  1970's  . Appendectomy  1966  . Vulvectomy N/A 02/19/2015    Procedure: SIMPLE PARTIAL VULVECTOMY;  Surgeon: [name], MD;  Location: [facility];  Service: Gynecology;  Laterality: N/A;    reports that  she has never smoked. She has never used smokeless tobacco. She reports that she does not drink alcohol or use illicit drugs. family history includes Breast cancer in her sister; Heart attack in her brother and father; Kidney failure in her sister. There is no history of Colon cancer, Esophageal cancer, Pancreatic cancer, or Liver disease. Allergies  Allergen Reactions  . Imitrex [Sumatriptan] Other (See Comments)    Reaction unknown  . Sulfa Antibiotics Nausea Only    Doesn't like to drink lots of water.   Current Outpatient Prescriptions on File Prior to Visit  Medication Sig Dispense Refill  . acetaminophen (TYLENOL) 325 MG tablet Take 650 mg by mouth every 6 (six) hours as needed (pain).    . Azelastine HCl 0.15 % SOLN Place 1 spray into the nose 2 (two) times daily.    . benzonatate (TESSALON) 100 MG capsule Take 1 capsule (100 mg total) by mouth every 8 (eight) hours. 21 capsule 0  . cetirizine (ZYRTEC) 10 MG tablet Take 10 mg by mouth every morning.     . cholecalciferol (VITAMIN D) 1000 UNITS tablet Take 1,000 Units by mouth every morning.     . clorazepate (TRANXENE) 7.5 MG tablet Take 1 tablet (7.5 mg total) by mouth 3 (three) times daily. 270 tablet 3  . fluticasone (FLONASE) 50 MCG/ACT nasal spray 1-2 puffs each nostril once daily (Patient taking differently: Place 1 spray into both nostrils every morning. 1-2 puffs each nostril once daily) 48 g 3  . fluticasone (FLOVENT HFA) 44 MCG/ACT inhaler Inhale 2 puffs into the lungs 2 (two) times daily.    . folic acid (FOLVITE) A999333 MCG tablet Take 400 mcg by mouth every morning.    . furosemide (LASIX) 40 MG tablet Take 1 tablet (40 mg total) by mouth daily. 90 tablet 3  . ipratropium-albuterol (DUONEB) 0.5-2.5 (3) MG/3ML SOLN Take 3 mLs by nebulization 2 (two) times daily. 180 mL 5  . magic mouthwash SOLN Swish and swallow 5 ml 4 times daily if needed 200 mL 2  . metoprolol succinate (TOPROL-XL) 25 MG 24 hr tablet Take 1 tablet (25 mg  total) by mouth daily before breakfast. 90 tablet 3  . Multiple Vitamin (MULTIVITAMIN) tablet Take 1 tablet by mouth daily.    [name] Kitchen omeprazole (PRILOSEC) 20 MG capsule Take 20 mg by mouth daily.    . pantoprazole (PROTONIX) 40 MG tablet Take 1 tablet (40 mg total) by mouth 2 (two) times daily. 180 tablet 3  . Polyethyl Glycol-Propyl Glycol (SYSTANE) 0.4-0.3 % SOLN Place 2 drops into both eyes 4 (four) times daily.     . pravastatin (PRAVACHOL) 40 MG tablet Take 1 tablet by mouth at  bedtime 90 tablet 1  . ranitidine (ZANTAC) 300 MG tablet Take 1 tablet (300 mg total) by mouth at bedtime.  30 tablet 1  . sennosides-docusate sodium (SENOKOT-S) 8.6-50 MG tablet Take 2 tablets by mouth at bedtime.     Alveda Reasons 20 MG TABS tablet Take 1 tablet by mouth  daily 90 tablet 3  . rifaximin (XIFAXAN) 550 MG TABS tablet Take 1 tablet (550 mg total) by mouth 3 (three) times daily. (Patient not taking: Reported on 12/15/2015) 42 tablet 0   No current facility-administered medications on file prior to visit.   Review of Systems  Constitutional: Negative for unusual diaphoresis or night sweats HENT: Negative for ringing in ear or discharge Eyes: Negative for double vision or worsening visual disturbance.  Respiratory: Negative for choking and stridor.   Gastrointestinal: Negative for vomiting or other signifcant bowel change Genitourinary: Negative for hematuria or change in urine volume.  Musculoskeletal: Negative for other MSK pain or swelling Skin: Negative for color change and worsening wound.  Neurological: Negative for tremors and numbness other than noted  Psychiatric/Behavioral: Negative for decreased concentration or agitation other than above       Objective:   Physical Exam BP 126/82 mmHg  Pulse 95  Temp(Src) 97.8 F (36.6 C) (Oral)  Ht 5\' 5"  (1.651 m)  Wt 188 lb (85.276 kg)  BMI 31.28 kg/m2  SpO2 95% VS noted, mild ill Constitutional: Pt appears in no significant distress HENT: Head:  NCAT.  Right Ear: External ear normal.  Left Ear: External ear normal.  Eyes: . Pupils are equal, round, and reactive to light. Conjunctivae and EOM are normal Bilat tm's with mild erythema.  Max sinus areas non tender.  Pharynx with mild erythema, no exudate Neck: Normal range of motion. Neck supple.  Cardiovascular: Normal rate and regular rhythm.   Pulmonary/Chest: Effort normal and breath sounds without rales or wheezing.  Neurological: Pt is alert. Not confused , motor grossly intact Skin: Skin is warm. No rash, no LE edema Psychiatric: Pt behavior is normal. No agitation. mild nervous    Assessment & Plan:

## 2015-12-15 NOTE — Assessment & Plan Note (Signed)
Etiology unclear, but likely post viral bronchitis vs chronic bronchiitis, some improved with tessalon perle, recent cxr neg, afeb and exam o/w benign, ok for add mucinex otc prn,  to f/u any worsening symptoms or concerns

## 2015-12-15 NOTE — Patient Instructions (Signed)
You can take Mucinex (or it's generic off brand) for congestion, and tylenol as needed for pain.  Please continue all other medications as before, and call for a refill of the tessalon perles if you need them  Please have the pharmacy call with any other refills you may need.  Please continue your efforts at being more active, low cholesterol diet, and weight control.  Please keep your appointments with your specialists as you may have planned

## 2015-12-16 NOTE — Assessment & Plan Note (Signed)
stable overall by history and exam, recent data reviewed with pt, and pt to continue medical treatment as before,  to f/u any worsening symptoms or concerns Lab Results  Component Value Date   WBC 7.5 05/04/2015   HGB 12.9 05/04/2015   HCT 38.2 05/04/2015   PLT 196.0 05/04/2015   GLUCOSE 97 05/04/2015   CHOL 164 05/04/2015   TRIG 109.0 05/04/2015   HDL 57.10 05/04/2015   LDLCALC 85 05/04/2015   ALT 13 05/04/2015   AST 19 05/04/2015   NA 140 05/04/2015   K 3.7 05/04/2015   CL 103 05/04/2015   CREATININE 0.98 05/04/2015   BUN 22 05/04/2015   CO2 32 05/04/2015   TSH 0.94 05/04/2015   INR 1.20 04/15/2013   HGBA1C 5.6 05/04/2015

## 2015-12-16 NOTE — Assessment & Plan Note (Signed)
stable overall by history and exam, recent data reviewed with pt, and pt to continue medical treatment as before,  to f/u any worsening symptoms or concerns BP Readings from Last 3 Encounters:  12/15/15 126/82  12/13/15 150/90  12/09/15 140/82

## 2016-02-10 IMAGING — DX DG CHEST 2V
2 series · 2 of 2 positions shown · non-contrast
Comparison: 11/16/2014

CLINICAL DATA: Cough for 2 weeks

EXAM:
CHEST  2 VIEW

[chest pa]
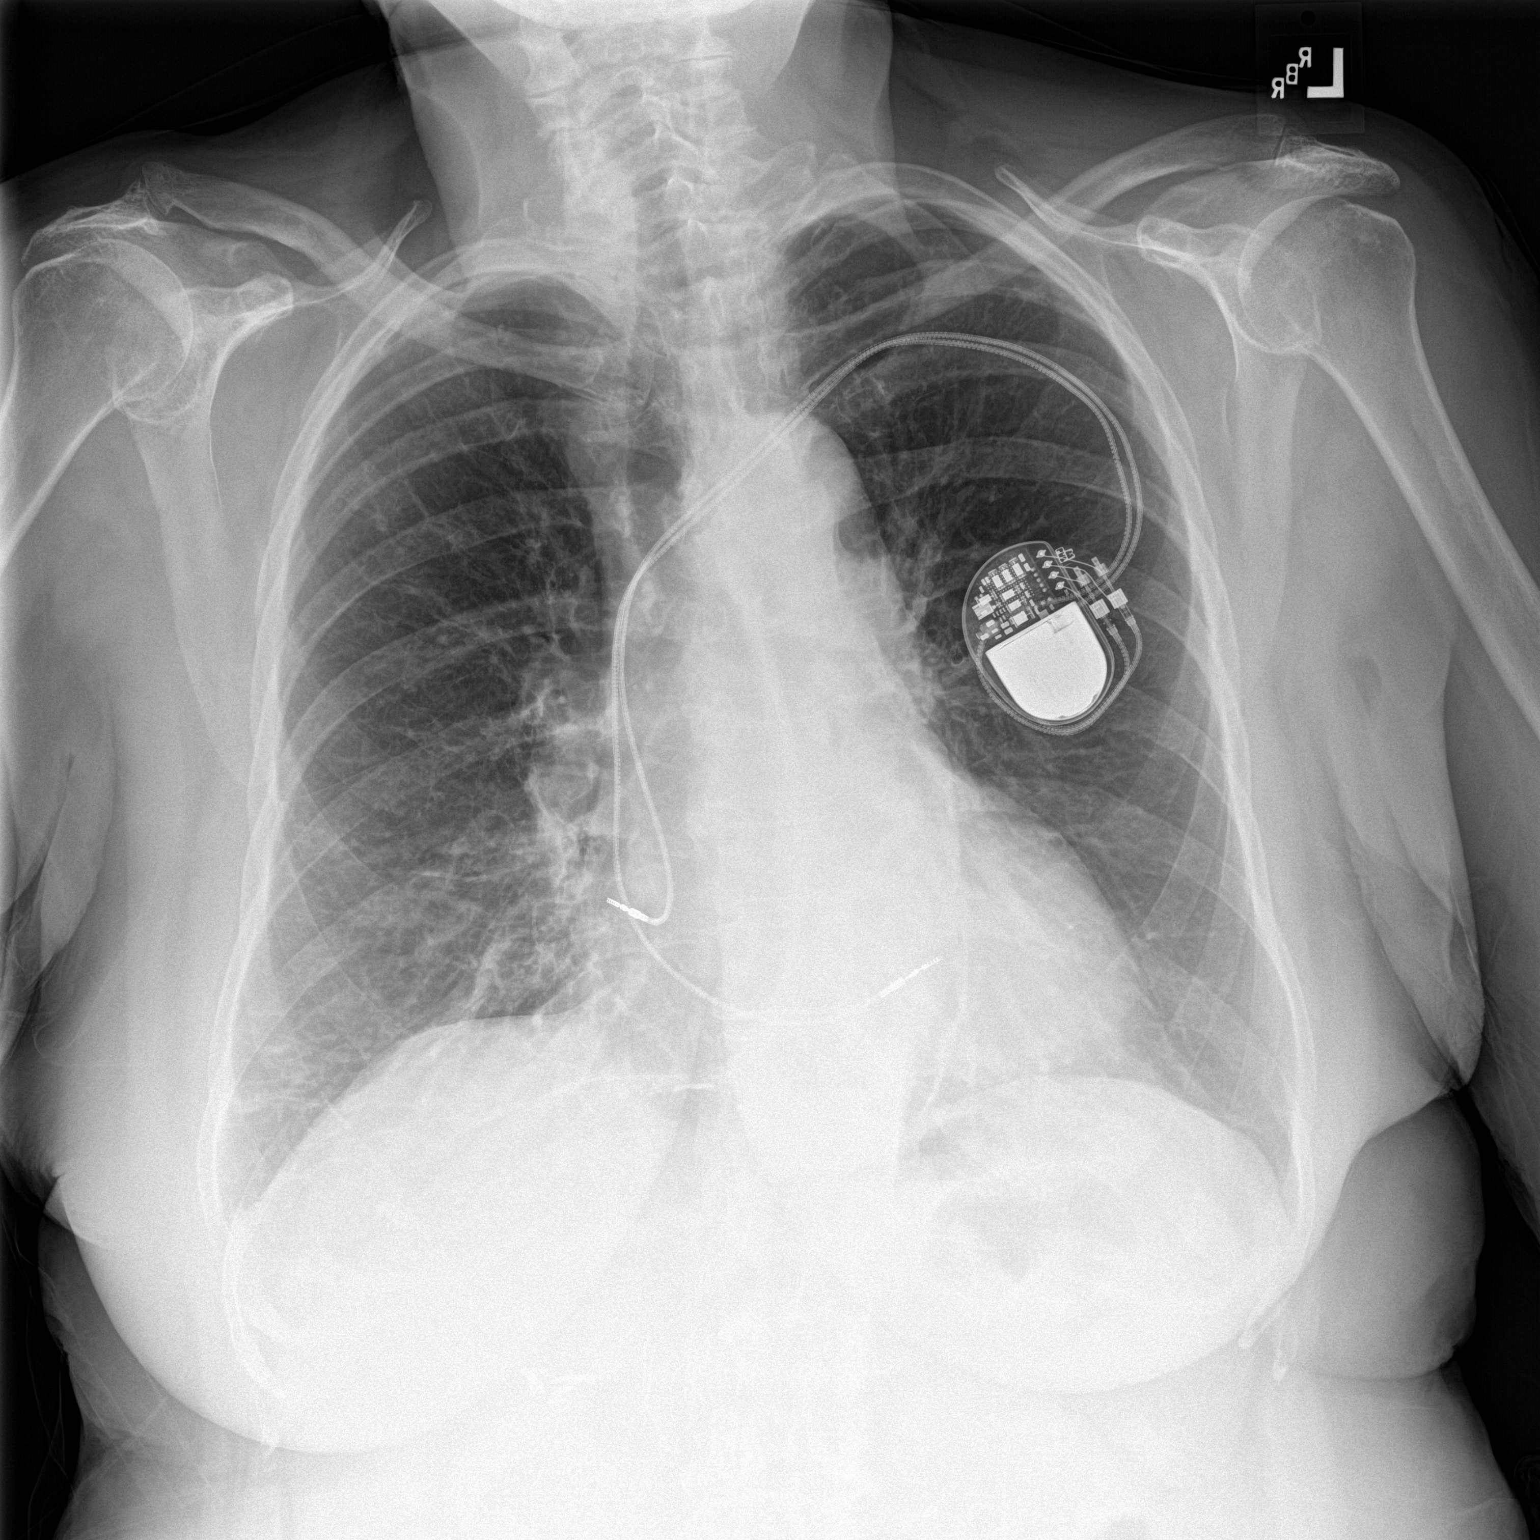

[chest lat]
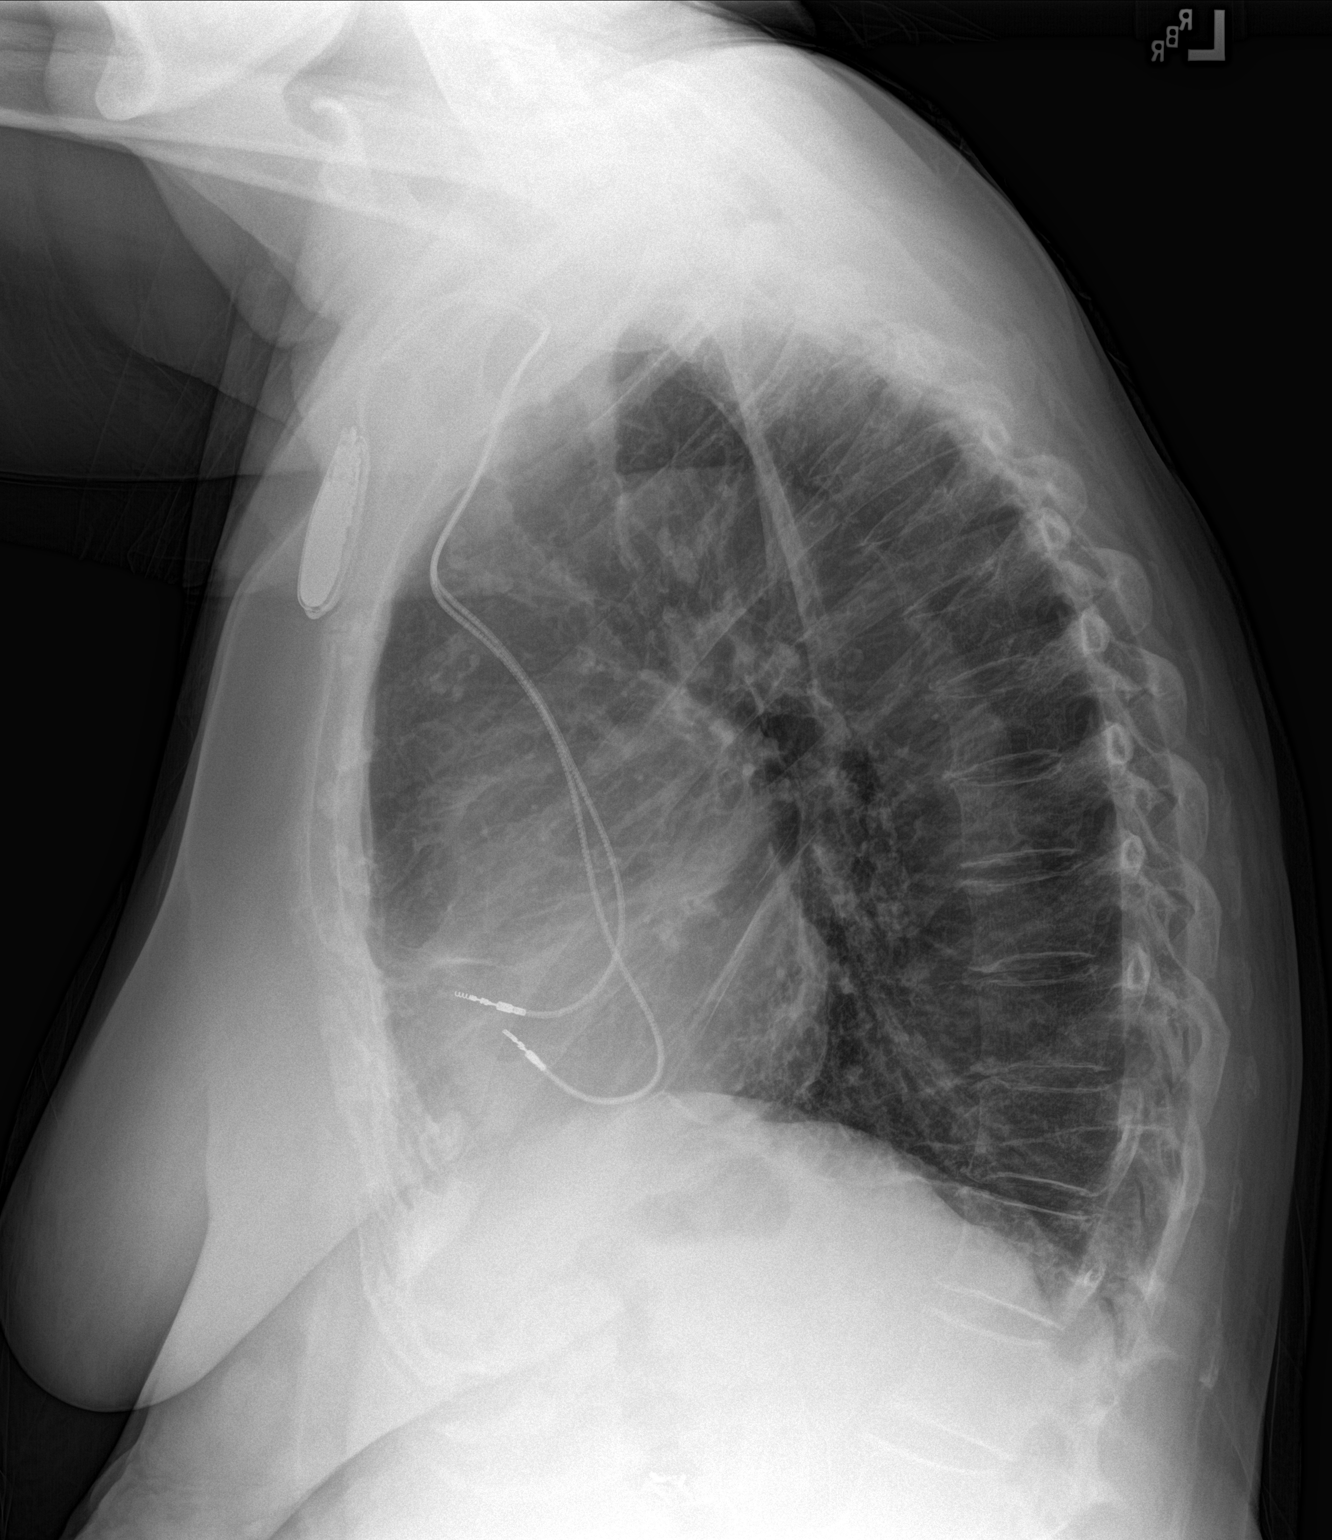

[2 of 2 positions shown; findings below may reference images not displayed]

FINDINGS: Cardiomediastinal silhouette is stable. Dual lead cardiac pacemaker
lead unchanged in position. Degenerative changes bilateral
shoulders. No acute infiltrate or pleural effusion. No pulmonary
edema. Degenerative changes and osteopenia thoracic spine.
IMPRESSION: No active cardiopulmonary disease.

## 2016-02-17 ENCOUNTER — Other Ambulatory Visit: Payer: Self-pay | Admitting: Internal Medicine

## 2016-02-18 ENCOUNTER — Other Ambulatory Visit: Payer: Self-pay

## 2016-02-18 MED ORDER — CLORAZEPATE DIPOTASSIUM 7.5 MG PO TABS: 7.5000 mg | ORAL_TABLET | Freq: Three times a day (TID) | ORAL | Status: DC

## 2016-02-18 NOTE — Telephone Encounter (Signed)
Recd faxed rx refill request from [facility]---please advise in dr Gwynn Burly absence----thanks

## 2016-02-18 NOTE — Telephone Encounter (Signed)
Greg signed---willing to give only 90 day supply --patient can ask for further refills from pcp---faxed to [facility] per [facility]ed request

## 2016-03-07 ENCOUNTER — Ambulatory Visit (INDEPENDENT_AMBULATORY_CARE_PROVIDER_SITE_OTHER): Payer: Medicare Other | Admitting: *Deleted

## 2016-03-07 DIAGNOSIS — I442 Atrioventricular block, complete: Secondary | ICD-10-CM

## 2016-03-08 NOTE — Progress Notes (Signed)
Remote pacemaker transmission.   

## 2016-03-23 ENCOUNTER — Ambulatory Visit: Payer: Self-pay | Admitting: Orthopedic Surgery

## 2016-03-23 NOTE — Progress Notes (Signed)
Preoperative surgical orders have been place into the Epic hospital system for [name] [name] on 03/23/2016, 1:47 PM  by Mickel Crow for surgery on 04-03-16.  Preop Total Knee orders including Experal, IV Tylenol, and IV Decadron as long as there are no contraindications to the above medications. Arlee Muslim, PA-C

## 2016-03-24 ENCOUNTER — Encounter (HOSPITAL_COMMUNITY)
Admission: RE | Admit: 2016-03-24 | Discharge: 2016-03-24 | Disposition: A | Discharge status: Home / Self Care | Payer: Medicare Other | Source: Ambulatory Visit | Attending: Orthopedic Surgery | Admitting: Orthopedic Surgery

## 2016-03-24 ENCOUNTER — Encounter (HOSPITAL_COMMUNITY): Payer: Self-pay

## 2016-03-24 ENCOUNTER — Other Ambulatory Visit: Payer: Self-pay

## 2016-03-24 DIAGNOSIS — E785 Hyperlipidemia, unspecified: Secondary | ICD-10-CM | POA: Insufficient documentation

## 2016-03-24 DIAGNOSIS — R0602 Shortness of breath: Secondary | ICD-10-CM | POA: Insufficient documentation

## 2016-03-24 DIAGNOSIS — Z86711 Personal history of pulmonary embolism: Secondary | ICD-10-CM | POA: Diagnosis not present

## 2016-03-24 DIAGNOSIS — Z01810 Encounter for preprocedural cardiovascular examination: Secondary | ICD-10-CM | POA: Insufficient documentation

## 2016-03-24 DIAGNOSIS — M1711 Unilateral primary osteoarthritis, right knee: Secondary | ICD-10-CM | POA: Diagnosis not present

## 2016-03-24 DIAGNOSIS — K219 Gastro-esophageal reflux disease without esophagitis: Secondary | ICD-10-CM | POA: Insufficient documentation

## 2016-03-24 DIAGNOSIS — I252 Old myocardial infarction: Secondary | ICD-10-CM | POA: Insufficient documentation

## 2016-03-24 DIAGNOSIS — Z86718 Personal history of other venous thrombosis and embolism: Secondary | ICD-10-CM | POA: Diagnosis not present

## 2016-03-24 DIAGNOSIS — Z950 Presence of cardiac pacemaker: Secondary | ICD-10-CM | POA: Diagnosis not present

## 2016-03-24 DIAGNOSIS — I10 Essential (primary) hypertension: Secondary | ICD-10-CM | POA: Insufficient documentation

## 2016-03-24 DIAGNOSIS — Z01812 Encounter for preprocedural laboratory examination: Secondary | ICD-10-CM | POA: Insufficient documentation

## 2016-03-24 DIAGNOSIS — Z8673 Personal history of transient ischemic attack (TIA), and cerebral infarction without residual deficits: Secondary | ICD-10-CM | POA: Diagnosis not present

## 2016-03-24 DIAGNOSIS — I480 Paroxysmal atrial fibrillation: Secondary | ICD-10-CM | POA: Diagnosis not present

## 2016-03-24 HISTORY — DX: Unspecified urinary incontinence: R32

## 2016-03-24 HISTORY — DX: Edema, unspecified: R60.9

## 2016-03-24 HISTORY — DX: Urgency of urination: R39.15

## 2016-03-24 LAB — URINALYSIS, ROUTINE W REFLEX MICROSCOPIC
Bilirubin Urine: NEGATIVE
GLUCOSE, UA: NEGATIVE mg/dL
Hgb urine dipstick: NEGATIVE
Ketones, ur: NEGATIVE mg/dL
LEUKOCYTES UA: NEGATIVE
NITRITE: POSITIVE — AB
PH: 6 (ref 5.0–8.0)
PROTEIN: NEGATIVE mg/dL
Specific Gravity, Urine: 1.01 (ref 1.005–1.030)

## 2016-03-24 LAB — CBC
HEMATOCRIT: 39.1 % (ref 36.0–46.0)
HEMOGLOBIN: 12.6 g/dL (ref 12.0–15.0)
MCH: 30.1 pg (ref 26.0–34.0)
MCHC: 32.2 g/dL (ref 30.0–36.0)
MCV: 93.3 fL (ref 78.0–100.0)
Platelets: 186 10*3/uL (ref 150–400)
RBC: 4.19 MIL/uL (ref 3.87–5.11)
RDW: 13.9 % (ref 11.5–15.5)
WBC: 6.7 10*3/uL (ref 4.0–10.5)

## 2016-03-24 LAB — URINE MICROSCOPIC-ADD ON

## 2016-03-24 LAB — PROTIME-INR
INR: 2.24 — ABNORMAL HIGH (ref 0.00–1.49)
PROTHROMBIN TIME: 23.8 s — AB (ref 11.6–15.2)

## 2016-03-24 LAB — COMPREHENSIVE METABOLIC PANEL
ALK PHOS: 52 U/L (ref 38–126)
ALT: 15 U/L (ref 14–54)
AST: 29 U/L (ref 15–41)
Albumin: 4.4 g/dL (ref 3.5–5.0)
Anion gap: 9 (ref 5–15)
BUN: 19 mg/dL (ref 6–20)
CO2: 29 mmol/L (ref 22–32)
CREATININE: 0.98 mg/dL (ref 0.44–1.00)
Calcium: 9.4 mg/dL (ref 8.9–10.3)
Chloride: 106 mmol/L (ref 101–111)
GFR calc Af Amer: >60 mL/min (ref >60)
GFR, EST NON AFRICAN AMERICAN: 54 mL/min — AB (ref >60)
Glucose, Bld: 88 mg/dL (ref 65–99)
Potassium: 4.3 mmol/L (ref 3.5–5.1)
Sodium: 144 mmol/L (ref 135–145)
TOTAL PROTEIN: 7.9 g/dL (ref 6.5–8.1)
Total Bilirubin: 0.8 mg/dL (ref 0.3–1.2)

## 2016-03-24 LAB — APTT: aPTT: 41 seconds — ABNORMAL HIGH (ref 24–37)

## 2016-03-24 LAB — SURGICAL PCR SCREEN
MRSA, PCR: NEGATIVE
STAPHYLOCOCCUS AUREUS: NEGATIVE

## 2016-03-24 NOTE — Patient Instructions (Addendum)
YOUR PROCEDURE IS SCHEDULED ON : 04/03/16  REPORT TO [facility] MAIN ENTRANCE FOLLOW SIGNS TO EAST ELEVATOR - GO TO 3rd FLOOR CHECK IN AT 3 EAST NURSES STATION (SHORT STAY) AT:  7:30 AM  CALL THIS NUMBER IF YOU HAVE PROBLEMS THE MORNING OF SURGERY [phone]  REMEMBER:ONLY 1 PER PERSON MAY GO TO SHORT STAY WITH YOU TO GET READY THE MORNING OF YOUR SURGERY  DO NOT EAT FOOD OR DRINK LIQUIDS AFTER MIDNIGHT  TAKE THESE MEDICINES THE MORNING OF SURGERY: METOPROLOL / CLORAZEPATE / PANTOPROZOLE / ZYRTEC / FLOVENT / FLUTOCASONE / EYE DROPS   BRING C-PAP TUBING AND MASK TO HOSPITAL  YOU MAY NOT HAVE ANY METAL ON YOUR BODY INCLUDING HAIR PINS AND PIERCING'S. DO NOT WEAR JEWELRY, MAKEUP, LOTIONS, POWDERS OR PERFUMES. DO NOT WEAR NAIL POLISH. DO NOT SHAVE 48 HRS PRIOR TO SURGERY. MEN MAY SHAVE FACE AND NECK.  DO NOT [place]. [facility] IS NOT RESPONSIBLE FOR VALUABLES.  CONTACTS, DENTURES OR PARTIALS MAY NOT BE WORN TO SURGERY. LEAVE SUITCASE IN CAR. CAN BE BROUGHT TO ROOM AFTER SURGERY.  PATIENTS DISCHARGED THE DAY OF SURGERY WILL NOT BE ALLOWED TO DRIVE HOME.  PLEASE READ OVER THE FOLLOWING INSTRUCTION SHEETS _________________________________________________________________________________                                          [facility] - PREPARING FOR SURGERY  Before surgery, you can play an important role.  Because skin is not sterile, your skin needs to be as free of germs as possible.  You can reduce the number of germs on your skin by washing with CHG (chlorahexidine gluconate) soap before surgery.  CHG is an antiseptic cleaner which kills germs and bonds with the skin to continue killing germs even after washing. Please DO NOT use if you have an allergy to CHG or antibacterial soaps.  If your skin becomes reddened/irritated stop using the CHG and inform your nurse when you arrive at Short Stay. Do not shave (including legs and underarms)  for at least 48 hours prior to the first CHG shower.  You may shave your face. Please follow these instructions carefully:   1.  Shower with CHG Soap the night before surgery and the  morning of Surgery.   2.  If you choose to wash your hair, wash your hair first as usual with your  normal  Shampoo.   3.  After you shampoo, rinse your hair and body thoroughly to remove the  shampoo.                                         4.  Use CHG as you would any other liquid soap.  You can apply chg directly  to the skin and wash . Gently wash with scrungie or clean wascloth    5.  Apply the CHG Soap to your body ONLY FROM THE NECK DOWN.   Do not use on open                           Wound or open sores. Avoid contact with eyes, ears mouth and genitals (private parts).  Genitals (private parts) with your normal soap.              6.  Wash thoroughly, paying special attention to the area where your surgery  will be performed.   7.  Thoroughly rinse your body with warm water from the neck down.   8.  DO NOT shower/wash with your normal soap after using and rinsing off  the CHG Soap .                9.  Pat yourself dry with a clean towel.             10.  Wear clean night clothes to bed after shower             11.  Place clean sheets on your bed the night of your first shower and do not  sleep with pets.  Day of Surgery : Do not apply any lotions/deodorants the morning of surgery.  Please wear clean clothes to the hospital/surgery center.  FAILURE TO FOLLOW THESE INSTRUCTIONS MAY RESULT IN THE CANCELLATION OF YOUR SURGERY    PATIENT SIGNATURE_________________________________  ______________________________________________________________________     [name] [name]  An incentive spirometer is a tool that can help keep your lungs clear and active. This tool measures how well you are filling your lungs with each breath. Taking long deep breaths may help reverse  or decrease the chance of developing breathing (pulmonary) problems (especially infection) following:  A long period of time when you are unable to move or be active. BEFORE THE PROCEDURE   If the spirometer includes an indicator to show your best effort, your nurse or respiratory therapist will set it to a desired goal.  If possible, sit up straight or lean slightly forward. Try not to slouch.  Hold the incentive spirometer in an upright position. INSTRUCTIONS FOR USE   Sit on the edge of your bed if possible, or sit up as far as you can in bed or on a chair.  Hold the incentive spirometer in an upright position.  Breathe out normally.  Place the mouthpiece in your mouth and seal your lips tightly around it.  Breathe in slowly and as deeply as possible, raising the piston or the ball toward the top of the column.  Hold your breath for 3-5 seconds or for as long as possible. Allow the piston or ball to fall to the bottom of the column.  Remove the mouthpiece from your mouth and breathe out normally.  Rest for a few seconds and repeat Steps 1 through 7 at least 10 times every 1-2 hours when you are awake. Take your time and take a few normal breaths between deep breaths.  The spirometer may include an indicator to show your best effort. Use the indicator as a goal to work toward during each repetition.  After each set of 10 deep breaths, practice coughing to be sure your lungs are clear. If you have an incision (the cut made at the time of surgery), support your incision when coughing by placing a pillow or rolled up towels firmly against it. Once you are able to get out of bed, walk around indoors and cough well. You may stop using the incentive spirometer when instructed by your caregiver.  RISKS AND COMPLICATIONS  Take your time so you do not get dizzy or light-headed.  If you are in pain, you may need to take or ask for pain medication before doing incentive spirometry. It is  harder to take a deep breath if you are having pain. AFTER USE  Rest and breathe slowly and easily.  It can be helpful to keep track of a log of your progress. Your caregiver can provide you with a simple table to help with this. If you are using the spirometer at home, follow these instructions: [place] IF:   You are having difficultly using the spirometer.  You have trouble using the spirometer as often as instructed.  Your pain medication is not giving enough relief while using the spirometer.  You develop fever of 100.5 F (38.1 C) or higher. SEEK IMMEDIATE MEDICAL CARE IF:   You cough up bloody sputum that had not been present before.  You develop fever of 102 F (38.9 C) or greater.  You develop worsening pain at or near the incision site. MAKE SURE YOU:   Understand these instructions.  Will watch your condition.  Will get help right away if you are not doing well or get worse. Document Released: 04/16/2007 Document Revised: 02/26/2012 Document Reviewed: 06/17/2007 ExitCare Patient Information 2014 ExitCare, [place].   ________________________________________________________________________  WHAT IS A BLOOD TRANSFUSION? Blood Transfusion Information  A transfusion is the replacement of blood or some of its parts. Blood is made up of multiple cells which provide different functions.  Red blood cells carry oxygen and are used for blood loss replacement.  White blood cells fight against infection.  Platelets control bleeding.  Plasma helps clot blood.  Other blood products are available for specialized needs, such as hemophilia or other clotting disorders. BEFORE THE TRANSFUSION  Who gives blood for transfusions?   Healthy volunteers who are fully evaluated to make sure their blood is safe. This is blood bank blood. Transfusion therapy is the safest it has ever been in the practice of medicine. Before blood is taken from a donor, a complete history  is taken to make sure that person has no history of diseases nor engages in risky social behavior (examples are intravenous drug use or sexual activity with multiple partners). The donor's travel history is screened to minimize risk of transmitting infections, such as malaria. The donated blood is tested for signs of infectious diseases, such as HIV and hepatitis. The blood is then tested to be sure it is compatible with you in order to minimize the chance of a transfusion reaction. If you or a relative donates blood, this is often done in anticipation of surgery and is not appropriate for emergency situations. It takes many days to process the donated blood. RISKS AND COMPLICATIONS Although transfusion therapy is very safe and saves many lives, the main dangers of transfusion include:   Getting an infectious disease.  Developing a transfusion reaction. This is an allergic reaction to something in the blood you were given. Every precaution is taken to prevent this. The decision to have a blood transfusion has been considered carefully by your caregiver before blood is given. Blood is not given unless the benefits outweigh the risks. AFTER THE TRANSFUSION  Right after receiving a blood transfusion, you will usually feel much better and more energetic. This is especially true if your red blood cells have gotten low (anemic). The transfusion raises the level of the red blood cells which carry oxygen, and this usually causes an energy increase.  The nurse administering the transfusion will monitor you carefully for complications. HOME CARE INSTRUCTIONS  No special instructions are needed after a transfusion. You may find your energy is better. Speak with  your caregiver about any limitations on activity for underlying diseases you may have. SEEK MEDICAL CARE IF:   Your condition is not improving after your transfusion.  You develop redness or irritation at the intravenous (IV) site. SEEK IMMEDIATE  MEDICAL CARE IF:  Any of the following symptoms occur over the next 12 hours:  Shaking chills.  You have a temperature by mouth above 102 F (38.9 C), not controlled by medicine.  Chest, back, or muscle pain.  People around you feel you are not acting correctly or are confused.  Shortness of breath or difficulty breathing.  Dizziness and fainting.  You get a rash or develop hives.  You have a decrease in urine output.  Your urine turns a dark color or changes to pink, red, or brown. Any of the following symptoms occur over the next 10 days:  You have a temperature by mouth above 102 F (38.9 C), not controlled by medicine.  Shortness of breath.  Weakness after normal activity.  The white part of the eye turns yellow (jaundice).  You have a decrease in the amount of urine or are urinating less often.  Your urine turns a dark color or changes to pink, red, or brown. Document Released: 12/01/2000 Document Revised: 02/26/2012 Document Reviewed: 07/20/2008 [facility] Patient Information 2014 [place], [place].  _______________________________________________________________________

## 2016-03-24 NOTE — Progress Notes (Signed)
ABNORMAL UA FAXED TO DR.ALUISIO

## 2016-03-28 ENCOUNTER — Ambulatory Visit: Payer: Self-pay | Admitting: Orthopedic Surgery

## 2016-03-28 NOTE — H&P (Signed)
[name] [name] DOB: [birth year] Married / Language: English / Race: White Female Date of Admission:  04/03/2016 CC:  Right Knee Pain History of Present Illness  The patient is a 80 year old female who comes in for a preoperative History and Physical. The patient is scheduled for a right total knee arthroplasty to be performed by Dr. Dione Plover. Aluisio, MD at [facility] on 04/03/2016. [name] [name] comes in today with a chief complaint of right knee pain. She reports she has had increased right knee pain for the last two months. She states most of her pain is along the lateral aspect and in the popliteal space. It is very stiff in the morning when she first gets up. When she starts to walk she feels some relief. No previous injections in the right knee. No previous surgery. No injury and no falls. She denies groin pain. No numbness or tingling in her legs. She has a history of a left total knee replacement almost two years ago. She has done very well with that. She said that this is one of the best things she has ever done. She does walk daily, she walks around the house and in the yard. The right knee is bone on bone in the lateral and patellofemoral compartments with a valgus deformity noted. She is nearly two years out from her left total knee. She has done very well with that. She should continue her active lifestyle and the right knee is holding her back. She has done well with the left knee and now is ready to proceed with the right knee. They have been treated conservatively in the past for the above stated problem and despite conservative measures, they continue to have progressive pain and severe functional limitations and dysfunction. They have failed non-operative management including home exercise, medications, and injections. It is felt that they would benefit from undergoing total joint replacement. Risks and benefits of the procedure have been discussed with the patient and they elect to  proceed with surgery. There are no active contraindications to surgery such as ongoing infection or rapidly progressive neurological disease.   Problem List/Past Medical Status post total left knee replacement [id number])  Primary osteoarthritis of right knee (M17.11)  Anxiety Disorder   High blood pressure  Congestive Heart Failure  Coronary Artery Disease/Heart Disease  Bleeding disorder  Chronic Obstructive Lung Disease  Myocardial infarction  2012 Cerebrovascular Accident  2004 Pacemaker  Gastroesophageal Reflux Disease  Blood Clot  DVTs History of Pulmonary Embolism  Heart murmur  Hypercholesterolemia  Osteoarthritis  Sleep Apnea  no CPAP, just nasal oxygen Cervical Cancer  Allergic Rhinitis  Sarcoidosis  Mild Pulmonary Hypertension  Paroxsymal Atrial Fib  Venous Insufficiency  Renal Insufficiency  History of Peptic Ulcer Disease  Hemorrhoids  Left Bundle Branch Block  Allergies Sulfa Drugs  "dries me out" Imitrex *Migraine Products**   Family History Heart Disease  mother, father and brother Heart disease in female family member before age 49  Heart disease in female family member before age 97  Cancer  sister Depression  mother Diabetes Mellitus  mother  Social History Alcohol use  never consumed alcohol Children  4 Current work status  retired Engineer, agricultural (Currently)  no Drug/Alcohol Rehab (Previously)  no Exercise  Exercises rarely; does other Illicit drug use  no Living situation  live alone Marital status  widowed Number of flights of stairs before winded  2-3 Pain Contract  no Tobacco / smoke exposure  no Tobacco use  never smoker  Medication History  Tylenol Extra Strength (500MG  Tablet, Oral) Active. ZyrTEC Allergy (Oral) Specific strength unknown - Active. Tranxene-T (Oral) Specific strength unknown - Active. Flovent HFA (Inhalation) Specific strength unknown - Active. Lasix (Oral)  Specific strength unknown - Active. DuoNeb (Inhalation) Specific strength unknown - Active. Toprol XL (Oral) Specific strength unknown - Active. Multivitamins (Oral) Specific strength unknown - Active. Protegra (Oral) Specific strength unknown - Active. PriLOSEC (Oral) Specific strength unknown - Active. Systane Ultra (Ophthalmic) Specific strength unknown - Active. Pravachol (Oral) Specific strength unknown - Active. Senokot S (Oral) Specific strength unknown - Active. Silvadene (1% Cream, External) Active.   Past Surgical History  Appendectomy  Date: 1966. Arthroscopy of Knee  left Dilation and Curettage of Uterus  Gallbladder Surgery  Date: 01/1996. laporoscopic Heart Stents  Hemorrhoidectomy  Hysterectomy  partial (non-cancerous), 1970's Spinal Surgery  Lumbar Disc Surgery x2, 1990 Tubal Ligation  IVC Filter Placement  Date: 07/2010. Total Knee Replacement - Left  Date: 04/21/2013. Pacemaker Placement  Date: 07/2008. Right Shoulder Surgery  Date: 01/2009. Decompression Left Ulnar Nerve  Date: 02/2008. Cardiac Cath  x 2, 2004, 2009   Review of Systems General Not Present- Chills, Fatigue, Fever, Memory Loss, Night Sweats, Weight Gain and Weight Loss. Skin Not Present- Eczema, Hives, Itching, Lesions and Rash. HEENT Not Present- Dentures, Double Vision, Headache, Hearing Loss, Tinnitus and Visual Loss. Respiratory Not Present- Allergies, Chronic Cough, Coughing up blood, Shortness of breath at rest and Shortness of breath with exertion. Cardiovascular Not Present- Chest Pain, Difficulty Breathing Lying Down, Murmur, Palpitations, Racing/skipping heartbeats and Swelling. Gastrointestinal Not Present- Abdominal Pain, Bloody Stool, Constipation, Diarrhea, Difficulty Swallowing, Heartburn, Jaundice, Loss of appetitie, Nausea and Vomiting. Female Genitourinary Not Present- Blood in Urine, Discharge, Flank Pain, Incontinence, Painful Urination, Urgency, Urinary  frequency, Urinary Retention, Urinating at Night and Weak urinary stream. Musculoskeletal Not Present- Back Pain, Joint Pain, Joint Swelling, Morning Stiffness, Muscle Pain, Muscle Weakness and Spasms. Neurological Not Present- Blackout spells, Difficulty with balance, Dizziness, Paralysis, Tremor and Weakness. Psychiatric Not Present- Insomnia.  Vitals  Weight: 187 lb Height: 64.5in Weight was reported by patient. Height was reported by patient. Body Surface Area: 1.91 m Body Mass Index: 31.6 kg/m  Pulse: 76 (Regular)  BP: 132/82 (Sitting, Right Arm, Standard  Physical Exam General Mental Status -Alert, cooperative and good historian. General Appearance-pleasant, Not in acute distress. Orientation-Oriented X3. Build & Nutrition-Well nourished and Well developed.  Head and Neck Head-normocephalic, atraumatic . Neck Global Assessment - supple, no bruit auscultated on the right, no bruit auscultated on the left.  Eye Vision-Wears corrective lenses. Pupil - Bilateral-Regular and Round. Motion - Bilateral-EOMI.  ENMT Note: bilateral hearing aids   Chest and Lung Exam Auscultation Breath sounds - clear at anterior chest wall and clear at posterior chest wall. Adventitious sounds - No Adventitious sounds.  Cardiovascular Auscultation Rhythm - Regular rate and rhythm. Heart Sounds - S1 WNL and S2 WNL. Murmurs & Other Heart Sounds - Auscultation of the heart reveals - No Murmurs.  Abdomen Palpation/Percussion Tenderness - Abdomen is non-tender to palpation. Rigidity (guarding) - Abdomen is soft. Auscultation Auscultation of the abdomen reveals - Bowel sounds normal.  Female Genitourinary Note: Not done, not pertinent to present illness   Musculoskeletal Note: On exam, she is alert and oriented, in no apparent distress. Her right knee shows no effusion. Her range of motion of the right knee is about 5 to 125. She is very tender medially. There  is no lateral tenderness or  instability.  RADIOGRAPHS She has got bone-on-bone arthritis, medial and patellofemoral.  Assessment & Plan Primary osteoarthritis of right knee (M17.11)  Note:Surgical Plans: Right Total Knee Replacement  Disposition: Home  PCP: Dr. Marin Olp   Topical TXA - CVA, MI  Anesthesia Issues: None  Signed electronically by Ok Edwards, III PA-C

## 2016-04-03 ENCOUNTER — Encounter (HOSPITAL_COMMUNITY): Payer: Self-pay | Admitting: *Deleted

## 2016-04-03 ENCOUNTER — Inpatient Hospital Stay (HOSPITAL_COMMUNITY)
Admission: RE | Admit: 2016-04-03 | Discharge: 2016-04-05 | DRG: 470 | Disposition: A | Discharge status: Home / Self Care | Payer: Medicare Other | Source: Ambulatory Visit | Attending: Orthopedic Surgery | Admitting: Orthopedic Surgery

## 2016-04-03 ENCOUNTER — Inpatient Hospital Stay (HOSPITAL_COMMUNITY): Payer: Medicare Other | Admitting: Anesthesiology

## 2016-04-03 ENCOUNTER — Encounter (HOSPITAL_COMMUNITY)
Admission: RE | Disposition: A | Discharge status: Home / Self Care | Payer: Self-pay | Source: Ambulatory Visit | Attending: Orthopedic Surgery

## 2016-04-03 DIAGNOSIS — Z818 Family history of other mental and behavioral disorders: Secondary | ICD-10-CM

## 2016-04-03 DIAGNOSIS — J449 Chronic obstructive pulmonary disease, unspecified: Secondary | ICD-10-CM | POA: Diagnosis present

## 2016-04-03 DIAGNOSIS — Z955 Presence of coronary angioplasty implant and graft: Secondary | ICD-10-CM | POA: Diagnosis not present

## 2016-04-03 DIAGNOSIS — Z833 Family history of diabetes mellitus: Secondary | ICD-10-CM

## 2016-04-03 DIAGNOSIS — Z86718 Personal history of other venous thrombosis and embolism: Secondary | ICD-10-CM

## 2016-04-03 DIAGNOSIS — G4733 Obstructive sleep apnea (adult) (pediatric): Secondary | ICD-10-CM | POA: Diagnosis present

## 2016-04-03 DIAGNOSIS — K219 Gastro-esophageal reflux disease without esophagitis: Secondary | ICD-10-CM | POA: Diagnosis present

## 2016-04-03 DIAGNOSIS — I110 Hypertensive heart disease with heart failure: Secondary | ICD-10-CM | POA: Diagnosis present

## 2016-04-03 DIAGNOSIS — Z8673 Personal history of transient ischemic attack (TIA), and cerebral infarction without residual deficits: Secondary | ICD-10-CM | POA: Diagnosis not present

## 2016-04-03 DIAGNOSIS — E7800 Pure hypercholesterolemia, unspecified: Secondary | ICD-10-CM | POA: Diagnosis present

## 2016-04-03 DIAGNOSIS — Z86711 Personal history of pulmonary embolism: Secondary | ICD-10-CM

## 2016-04-03 DIAGNOSIS — Z96652 Presence of left artificial knee joint: Secondary | ICD-10-CM | POA: Diagnosis present

## 2016-04-03 DIAGNOSIS — I252 Old myocardial infarction: Secondary | ICD-10-CM

## 2016-04-03 DIAGNOSIS — Z950 Presence of cardiac pacemaker: Secondary | ICD-10-CM | POA: Diagnosis not present

## 2016-04-03 DIAGNOSIS — Z79899 Other long term (current) drug therapy: Secondary | ICD-10-CM

## 2016-04-03 DIAGNOSIS — Z6832 Body mass index (BMI) 32.0-32.9, adult: Secondary | ICD-10-CM

## 2016-04-03 DIAGNOSIS — F419 Anxiety disorder, unspecified: Secondary | ICD-10-CM | POA: Diagnosis present

## 2016-04-03 DIAGNOSIS — I959 Hypotension, unspecified: Secondary | ICD-10-CM | POA: Diagnosis not present

## 2016-04-03 DIAGNOSIS — Z809 Family history of malignant neoplasm, unspecified: Secondary | ICD-10-CM | POA: Diagnosis not present

## 2016-04-03 DIAGNOSIS — I5042 Chronic combined systolic (congestive) and diastolic (congestive) heart failure: Secondary | ICD-10-CM | POA: Diagnosis present

## 2016-04-03 DIAGNOSIS — Z7901 Long term (current) use of anticoagulants: Secondary | ICD-10-CM

## 2016-04-03 DIAGNOSIS — Z01812 Encounter for preprocedural laboratory examination: Secondary | ICD-10-CM | POA: Diagnosis not present

## 2016-04-03 DIAGNOSIS — Z8249 Family history of ischemic heart disease and other diseases of the circulatory system: Secondary | ICD-10-CM

## 2016-04-03 DIAGNOSIS — I2510 Atherosclerotic heart disease of native coronary artery without angina pectoris: Secondary | ICD-10-CM | POA: Diagnosis present

## 2016-04-03 DIAGNOSIS — M25561 Pain in right knee: Secondary | ICD-10-CM | POA: Diagnosis present

## 2016-04-03 DIAGNOSIS — Z8711 Personal history of peptic ulcer disease: Secondary | ICD-10-CM | POA: Diagnosis not present

## 2016-04-03 DIAGNOSIS — M1710 Unilateral primary osteoarthritis, unspecified knee: Secondary | ICD-10-CM | POA: Diagnosis present

## 2016-04-03 DIAGNOSIS — M179 Osteoarthritis of knee, unspecified: Secondary | ICD-10-CM | POA: Diagnosis present

## 2016-04-03 DIAGNOSIS — M1711 Unilateral primary osteoarthritis, right knee: Principal | ICD-10-CM | POA: Diagnosis present

## 2016-04-03 HISTORY — PX: TOTAL KNEE ARTHROPLASTY: SHX125

## 2016-04-03 LAB — PROTIME-INR
INR: 0.96 (ref 0.00–1.49)
Prothrombin Time: 13 seconds (ref 11.6–15.2)

## 2016-04-03 LAB — TYPE AND SCREEN
ABO/RH(D): A POS
Antibody Screen: NEGATIVE

## 2016-04-03 SURGERY — ARTHROPLASTY, KNEE, TOTAL
Anesthesia: Spinal | Site: Knee | Laterality: Right

## 2016-04-03 MED ORDER — METHOCARBAMOL 1000 MG/10ML IJ SOLN
500.0000 mg | Freq: Four times a day (QID) | INTRAVENOUS | Status: DC | PRN
  Administered 2016-04-03: 500 mg via INTRAVENOUS
  Filled 2016-04-03 (×2): qty 5

## 2016-04-03 MED ORDER — AZELASTINE HCL 0.15 % NA SOLN
2.0000 | Freq: Two times a day (BID) | NASAL | Status: DC
  Administered 2016-04-04 – 2016-04-05 (×2): 2 via NASAL

## 2016-04-03 MED ORDER — CEFAZOLIN SODIUM-DEXTROSE 2-4 GM/100ML-% IV SOLN
2.0000 g | INTRAVENOUS | Status: AC
  Administered 2016-04-03: 2 g via INTRAVENOUS

## 2016-04-03 MED ORDER — RIVAROXABAN 10 MG PO TABS
10.0000 mg | ORAL_TABLET | Freq: Every day | ORAL | Status: DC
  Administered 2016-04-04 – 2016-04-05 (×2): 10 mg via ORAL
  Filled 2016-04-03 (×3): qty 1

## 2016-04-03 MED ORDER — TRANEXAMIC ACID 1000 MG/10ML IV SOLN
2000.0000 mg | Freq: Once | INTRAVENOUS | Status: DC
  Filled 2016-04-03: qty 20

## 2016-04-03 MED ORDER — LACTATED RINGERS IV SOLN
INTRAVENOUS | Status: DC | PRN
  Administered 2016-04-03 (×2): via INTRAVENOUS

## 2016-04-03 MED ORDER — BUDESONIDE 0.25 MG/2ML IN SUSP
0.2500 mg | Freq: Two times a day (BID) | RESPIRATORY_TRACT | Status: DC
  Administered 2016-04-03 – 2016-04-05 (×4): 0.25 mg via RESPIRATORY_TRACT
  Filled 2016-04-03 (×4): qty 2

## 2016-04-03 MED ORDER — BUPIVACAINE HCL (PF) 0.75 % IJ SOLN
INTRAMUSCULAR | Status: DC | PRN
  Administered 2016-04-03: 1.7 mL via INTRATHECAL

## 2016-04-03 MED ORDER — FLUTICASONE PROPIONATE 50 MCG/ACT NA SUSP
2.0000 | Freq: Every morning | NASAL | Status: DC
  Administered 2016-04-05: 2 via NASAL
  Filled 2016-04-03: qty 16

## 2016-04-03 MED ORDER — FENTANYL CITRATE (PF) 100 MCG/2ML IJ SOLN
INTRAMUSCULAR | Status: AC
  Filled 2016-04-03: qty 2

## 2016-04-03 MED ORDER — PRAVASTATIN SODIUM 40 MG PO TABS
40.0000 mg | ORAL_TABLET | Freq: Every day | ORAL | Status: DC
  Administered 2016-04-03 – 2016-04-05 (×3): 40 mg via ORAL
  Filled 2016-04-03 (×3): qty 1

## 2016-04-03 MED ORDER — SODIUM CHLORIDE 0.9 % IJ SOLN
INTRAMUSCULAR | Status: DC | PRN
  Administered 2016-04-03: 30 mL

## 2016-04-03 MED ORDER — ONDANSETRON HCL 4 MG/2ML IJ SOLN
INTRAMUSCULAR | Status: DC | PRN
  Administered 2016-04-03: 4 mg via INTRAVENOUS

## 2016-04-03 MED ORDER — ONDANSETRON HCL 4 MG PO TABS: 4.0000 mg | ORAL_TABLET | Freq: Four times a day (QID) | ORAL | Status: DC | PRN

## 2016-04-03 MED ORDER — POLYETHYLENE GLYCOL 3350 17 G PO PACK: 17.0000 g | PACK | Freq: Every day | ORAL | Status: DC | PRN

## 2016-04-03 MED ORDER — SODIUM CHLORIDE 0.9 % IJ SOLN
INTRAMUSCULAR | Status: AC
  Filled 2016-04-03: qty 10

## 2016-04-03 MED ORDER — MAGIC MOUTHWASH
5.0000 mL | Freq: Four times a day (QID) | ORAL | Status: DC | PRN
  Filled 2016-04-03: qty 5

## 2016-04-03 MED ORDER — TRAMADOL HCL 50 MG PO TABS: 50.0000 mg | ORAL_TABLET | Freq: Four times a day (QID) | ORAL | Status: DC | PRN

## 2016-04-03 MED ORDER — PHENYLEPHRINE HCL 10 MG/ML IJ SOLN
INTRAMUSCULAR | Status: AC
Start: 2016-04-03 — End: 2016-04-03
  Filled 2016-04-03: qty 1

## 2016-04-03 MED ORDER — ACETAMINOPHEN 500 MG PO TABS
1000.0000 mg | ORAL_TABLET | Freq: Four times a day (QID) | ORAL | Status: AC
  Administered 2016-04-03 – 2016-04-04 (×4): 1000 mg via ORAL
  Filled 2016-04-03 (×4): qty 2

## 2016-04-03 MED ORDER — BUPIVACAINE HCL (PF) 0.25 % IJ SOLN
INTRAMUSCULAR | Status: AC
  Filled 2016-04-03: qty 30

## 2016-04-03 MED ORDER — BUPIVACAINE LIPOSOME 1.3 % IJ SUSP
20.0000 mL | Freq: Once | INTRAMUSCULAR | Status: DC
  Filled 2016-04-03: qty 20

## 2016-04-03 MED ORDER — CEFAZOLIN SODIUM-DEXTROSE 2-4 GM/100ML-% IV SOLN
2.0000 g | Freq: Four times a day (QID) | INTRAVENOUS | Status: AC
  Administered 2016-04-03 (×2): 2 g via INTRAVENOUS
  Filled 2016-04-03 (×2): qty 100

## 2016-04-03 MED ORDER — FENTANYL CITRATE (PF) 100 MCG/2ML IJ SOLN
INTRAMUSCULAR | Status: DC | PRN
  Administered 2016-04-03 (×2): 50 ug via INTRAVENOUS

## 2016-04-03 MED ORDER — SODIUM CHLORIDE 0.9 % IJ SOLN
INTRAMUSCULAR | Status: AC
  Filled 2016-04-03: qty 50

## 2016-04-03 MED ORDER — MENTHOL 3 MG MT LOZG: 1.0000 | LOZENGE | OROMUCOSAL | Status: DC | PRN

## 2016-04-03 MED ORDER — METOCLOPRAMIDE HCL 10 MG PO TABS: 5.0000 mg | ORAL_TABLET | Freq: Three times a day (TID) | ORAL | Status: DC | PRN

## 2016-04-03 MED ORDER — PROPOFOL 10 MG/ML IV BOLUS
INTRAVENOUS | Status: AC
  Filled 2016-04-03: qty 60

## 2016-04-03 MED ORDER — MORPHINE SULFATE (PF) 2 MG/ML IV SOLN
1.0000 mg | INTRAVENOUS | Status: DC | PRN
  Administered 2016-04-03: 1 mg via INTRAVENOUS
  Filled 2016-04-03: qty 1

## 2016-04-03 MED ORDER — FUROSEMIDE 40 MG PO TABS
40.0000 mg | ORAL_TABLET | Freq: Every day | ORAL | Status: DC
  Administered 2016-04-04: 40 mg via ORAL
  Filled 2016-04-03 (×2): qty 1

## 2016-04-03 MED ORDER — SODIUM CHLORIDE 0.9 % IV SOLN
INTRAVENOUS | Status: DC
  Administered 2016-04-03: 22:00:00 via INTRAVENOUS

## 2016-04-03 MED ORDER — IPRATROPIUM-ALBUTEROL 0.5-2.5 (3) MG/3ML IN SOLN
3.0000 mL | Freq: Two times a day (BID) | RESPIRATORY_TRACT | Status: DC
  Administered 2016-04-03 – 2016-04-05 (×4): 3 mL via RESPIRATORY_TRACT
  Filled 2016-04-03 (×4): qty 3

## 2016-04-03 MED ORDER — METOPROLOL SUCCINATE ER 25 MG PO TB24
25.0000 mg | ORAL_TABLET | Freq: Every day | ORAL | Status: DC
  Administered 2016-04-04 – 2016-04-05 (×2): 25 mg via ORAL
  Filled 2016-04-03 (×2): qty 1

## 2016-04-03 MED ORDER — BISACODYL 10 MG RE SUPP: 10.0000 mg | Freq: Every day | RECTAL | Status: DC | PRN

## 2016-04-03 MED ORDER — PHENOL 1.4 % MT LIQD
1.0000 | OROMUCOSAL | Status: DC | PRN
  Filled 2016-04-03: qty 177

## 2016-04-03 MED ORDER — CHLORHEXIDINE GLUCONATE 4 % EX LIQD: 60.0000 mL | Freq: Once | CUTANEOUS | Status: DC

## 2016-04-03 MED ORDER — DEXAMETHASONE SODIUM PHOSPHATE 10 MG/ML IJ SOLN
10.0000 mg | Freq: Once | INTRAMUSCULAR | Status: AC
  Administered 2016-04-04: 10 mg via INTRAVENOUS
  Filled 2016-04-03: qty 1

## 2016-04-03 MED ORDER — DIPHENHYDRAMINE HCL 12.5 MG/5ML PO ELIX: 12.5000 mg | ORAL_SOLUTION | ORAL | Status: DC | PRN

## 2016-04-03 MED ORDER — CLORAZEPATE DIPOTASSIUM 7.5 MG PO TABS
7.5000 mg | ORAL_TABLET | Freq: Three times a day (TID) | ORAL | Status: DC
  Administered 2016-04-03 – 2016-04-04 (×4): 7.5 mg via ORAL
  Filled 2016-04-03 (×4): qty 1

## 2016-04-03 MED ORDER — PANTOPRAZOLE SODIUM 40 MG PO TBEC
40.0000 mg | DELAYED_RELEASE_TABLET | Freq: Two times a day (BID) | ORAL | Status: DC
  Administered 2016-04-03 – 2016-04-05 (×4): 40 mg via ORAL
  Filled 2016-04-03 (×5): qty 1

## 2016-04-03 MED ORDER — ONDANSETRON HCL 4 MG/2ML IJ SOLN
INTRAMUSCULAR | Status: AC
  Filled 2016-04-03: qty 2

## 2016-04-03 MED ORDER — DEXAMETHASONE SODIUM PHOSPHATE 10 MG/ML IJ SOLN
10.0000 mg | Freq: Once | INTRAMUSCULAR | Status: AC
  Administered 2016-04-03: 10 mg via INTRAVENOUS

## 2016-04-03 MED ORDER — DEXAMETHASONE SODIUM PHOSPHATE 10 MG/ML IJ SOLN
INTRAMUSCULAR | Status: AC
  Filled 2016-04-03: qty 1

## 2016-04-03 MED ORDER — FENTANYL CITRATE (PF) 100 MCG/2ML IJ SOLN: 25.0000 ug | INTRAMUSCULAR | Status: DC | PRN

## 2016-04-03 MED ORDER — EPHEDRINE SULFATE 50 MG/ML IJ SOLN
INTRAMUSCULAR | Status: AC
  Filled 2016-04-03: qty 1

## 2016-04-03 MED ORDER — FLEET ENEMA 7-19 GM/118ML RE ENEM: 1.0000 | ENEMA | Freq: Once | RECTAL | Status: DC | PRN

## 2016-04-03 MED ORDER — LORATADINE 10 MG PO TABS
10.0000 mg | ORAL_TABLET | Freq: Every day | ORAL | Status: DC
  Administered 2016-04-04 – 2016-04-05 (×2): 10 mg via ORAL
  Filled 2016-04-03 (×2): qty 1

## 2016-04-03 MED ORDER — ACETAMINOPHEN 650 MG RE SUPP: 650.0000 mg | Freq: Four times a day (QID) | RECTAL | Status: DC | PRN

## 2016-04-03 MED ORDER — METOCLOPRAMIDE HCL 5 MG/ML IJ SOLN: 5.0000 mg | Freq: Three times a day (TID) | INTRAMUSCULAR | Status: DC | PRN

## 2016-04-03 MED ORDER — DOCUSATE SODIUM 100 MG PO CAPS
100.0000 mg | ORAL_CAPSULE | Freq: Two times a day (BID) | ORAL | Status: DC
  Administered 2016-04-03 – 2016-04-05 (×4): 100 mg via ORAL

## 2016-04-03 MED ORDER — MIDAZOLAM HCL 2 MG/2ML IJ SOLN
INTRAMUSCULAR | Status: AC
  Filled 2016-04-03: qty 2

## 2016-04-03 MED ORDER — 0.9 % SODIUM CHLORIDE (POUR BTL) OPTIME
TOPICAL | Status: DC | PRN
  Administered 2016-04-03: 1000 mL

## 2016-04-03 MED ORDER — PROPOFOL 500 MG/50ML IV EMUL
INTRAVENOUS | Status: DC | PRN
  Administered 2016-04-03: 125 ug/kg/min via INTRAVENOUS

## 2016-04-03 MED ORDER — MIDAZOLAM HCL 5 MG/5ML IJ SOLN
INTRAMUSCULAR | Status: DC | PRN
  Administered 2016-04-03: 2 mg via INTRAVENOUS

## 2016-04-03 MED ORDER — SODIUM CHLORIDE 0.9 % IV SOLN: INTRAVENOUS | Status: DC

## 2016-04-03 MED ORDER — ONDANSETRON HCL 4 MG/2ML IJ SOLN: 4.0000 mg | Freq: Four times a day (QID) | INTRAMUSCULAR | Status: DC | PRN

## 2016-04-03 MED ORDER — OXYCODONE HCL 5 MG PO TABS
5.0000 mg | ORAL_TABLET | ORAL | Status: DC | PRN
  Administered 2016-04-03 – 2016-04-05 (×10): 10 mg via ORAL
  Filled 2016-04-03: qty 1
  Filled 2016-04-03 (×9): qty 2
  Filled 2016-04-03: qty 1

## 2016-04-03 MED ORDER — CEFAZOLIN SODIUM-DEXTROSE 2-4 GM/100ML-% IV SOLN
INTRAVENOUS | Status: AC
  Filled 2016-04-03: qty 100

## 2016-04-03 MED ORDER — ACETAMINOPHEN 10 MG/ML IV SOLN
INTRAVENOUS | Status: AC
Start: 2016-04-03 — End: 2016-04-03
  Filled 2016-04-03: qty 100

## 2016-04-03 MED ORDER — ACETAMINOPHEN 325 MG PO TABS: 650.0000 mg | ORAL_TABLET | Freq: Four times a day (QID) | ORAL | Status: DC | PRN

## 2016-04-03 MED ORDER — TRANEXAMIC ACID 1000 MG/10ML IV SOLN
2000.0000 mg | INTRAVENOUS | Status: DC | PRN
  Administered 2016-04-03: 2000 mg via TOPICAL

## 2016-04-03 MED ORDER — ACETAMINOPHEN 10 MG/ML IV SOLN
1000.0000 mg | Freq: Once | INTRAVENOUS | Status: AC
  Administered 2016-04-03: 1000 mg via INTRAVENOUS

## 2016-04-03 MED ORDER — SODIUM CHLORIDE 0.9 % IV SOLN
10.0000 mg | INTRAVENOUS | Status: DC | PRN
  Administered 2016-04-03: 50 ug/min via INTRAVENOUS

## 2016-04-03 MED ORDER — BUPIVACAINE LIPOSOME 1.3 % IJ SUSP
INTRAMUSCULAR | Status: DC | PRN
  Administered 2016-04-03: 20 mL

## 2016-04-03 MED ORDER — METHOCARBAMOL 500 MG PO TABS: 500.0000 mg | ORAL_TABLET | Freq: Four times a day (QID) | ORAL | Status: DC | PRN

## 2016-04-03 MED ORDER — STERILE WATER FOR IRRIGATION IR SOLN
Status: DC | PRN
  Administered 2016-04-03: 2000 mL

## 2016-04-03 MED ORDER — BUPIVACAINE HCL 0.25 % IJ SOLN
INTRAMUSCULAR | Status: DC | PRN
  Administered 2016-04-03: 20 mL

## 2016-04-03 MED ORDER — SODIUM CHLORIDE 0.9 % IR SOLN
Status: DC | PRN
  Administered 2016-04-03: 1000 mL

## 2016-04-03 SURGICAL SUPPLY — 50 items
BAG DECANTER FOR FLEXI CONT (MISCELLANEOUS) ×3 IMPLANT
BAG ZIPLOCK 12X15 (MISCELLANEOUS) ×3 IMPLANT
BANDAGE ACE 6X5 VEL STRL LF (GAUZE/BANDAGES/DRESSINGS) ×3 IMPLANT
BLADE HEX COATED 2.75 (ELECTRODE) ×3 IMPLANT
BLADE SAG 18X100X1.27 (BLADE) ×3 IMPLANT
BLADE SAW SGTL 11.0X1.19X90.0M (BLADE) ×3 IMPLANT
BOWL SMART MIX CTS (DISPOSABLE) ×3 IMPLANT
CAP KNEE TOTAL 3 SIGMA ×3 IMPLANT
CEMENT HV SMART SET (Cement) ×6 IMPLANT
CLOSURE WOUND 1/2 X4 (GAUZE/BANDAGES/DRESSINGS) ×2
CLOTH BEACON ORANGE TIMEOUT ST (SAFETY) ×3 IMPLANT
CUFF TOURN SGL QUICK 34 (TOURNIQUET CUFF) ×2
CUFF TRNQT CYL 34X4X40X1 (TOURNIQUET CUFF) ×1 IMPLANT
DECANTER SPIKE VIAL GLASS SM (MISCELLANEOUS) ×3 IMPLANT
DRAPE U-SHAPE 47X51 STRL (DRAPES) ×3 IMPLANT
DRSG ADAPTIC 3X8 NADH LF (GAUZE/BANDAGES/DRESSINGS) ×3 IMPLANT
DRSG PAD ABDOMINAL 8X10 ST (GAUZE/BANDAGES/DRESSINGS) ×3 IMPLANT
DRSG TEGADERM 2-3/8X2-3/4 SM (GAUZE/BANDAGES/DRESSINGS) ×6 IMPLANT
DURAPREP 26ML APPLICATOR (WOUND CARE) ×3 IMPLANT
ELECT REM PT RETURN 9FT ADLT (ELECTROSURGICAL) ×3
ELECTRODE REM PT RTRN 9FT ADLT (ELECTROSURGICAL) ×1 IMPLANT
EVACUATOR 1/8 PVC DRAIN (DRAIN) ×3 IMPLANT
GAUZE SPONGE 4X4 12PLY STRL (GAUZE/BANDAGES/DRESSINGS) ×3 IMPLANT
GLOVE BIO SURGEON STRL SZ8 (GLOVE) ×6 IMPLANT
GLOVE BIOGEL PI IND STRL 7.5 (GLOVE) ×4 IMPLANT
GLOVE BIOGEL PI IND STRL 8 (GLOVE) ×2 IMPLANT
GLOVE BIOGEL PI INDICATOR 7.5 (GLOVE) ×8
GLOVE BIOGEL PI INDICATOR 8 (GLOVE) ×4
GLOVE SURG SS PI 7.0 STRL IVOR (GLOVE) ×3 IMPLANT
GLOVE SURG SS PI 7.5 STRL IVOR (GLOVE) ×3 IMPLANT
GOWN STRL REUS W/TWL LRG LVL3 (GOWN DISPOSABLE) ×6 IMPLANT
GOWN STRL REUS W/TWL XL LVL3 (GOWN DISPOSABLE) ×6 IMPLANT
HANDPIECE INTERPULSE COAX TIP (DISPOSABLE) ×2
IMMOBILIZER KNEE 20 (SOFTGOODS) ×3
IMMOBILIZER KNEE 20 THIGH 36 (SOFTGOODS) ×1 IMPLANT
MANIFOLD NEPTUNE II (INSTRUMENTS) ×3 IMPLANT
PACK TOTAL KNEE CUSTOM (KITS) ×3 IMPLANT
PAD ABD 8X10 STRL (GAUZE/BANDAGES/DRESSINGS) ×3 IMPLANT
PADDING CAST COTTON 6X4 STRL (CAST SUPPLIES) ×6 IMPLANT
POSITIONER SURGICAL ARM (MISCELLANEOUS) ×3 IMPLANT
SET HNDPC FAN SPRY TIP SCT (DISPOSABLE) ×1 IMPLANT
STRIP CLOSURE SKIN 1/2X4 (GAUZE/BANDAGES/DRESSINGS) ×4 IMPLANT
SUT MNCRL AB 4-0 PS2 18 (SUTURE) ×3 IMPLANT
SUT VIC AB 2-0 CT1 27 (SUTURE) ×6
SUT VIC AB 2-0 CT1 TAPERPNT 27 (SUTURE) ×3 IMPLANT
SUT VLOC 180 0 24IN GS25 (SUTURE) ×3 IMPLANT
SYR 50ML LL SCALE MARK (SYRINGE) ×6 IMPLANT
TRAY FOLEY W/METER SILVER 14FR (SET/KITS/TRAYS/PACK) ×3 IMPLANT
WRAP KNEE MAXI GEL POST OP (GAUZE/BANDAGES/DRESSINGS) ×3 IMPLANT
YANKAUER SUCT BULB TIP 10FT TU (MISCELLANEOUS) ×3 IMPLANT

## 2016-04-03 NOTE — Interval H&P Note (Signed)
History and Physical Interval Note:  04/03/2016 9:58 AM  [name] [name]  has presented today for surgery, with the diagnosis of RIGHT KNEE OA  The various methods of treatment have been discussed with the patient and family. After consideration of risks, benefits and other options for treatment, the patient has consented to  Procedure(s): RIGHT TOTAL KNEE ARTHROPLASTY (Right) as a surgical intervention .  The patient's history has been reviewed, patient examined, no change in status, stable for surgery.  I have reviewed the patient's chart and labs.  Questions were answered to the patient's satisfaction.     Gearlean Alf

## 2016-04-03 NOTE — Anesthesia Preprocedure Evaluation (Addendum)
Anesthesia Evaluation  Patient identified by MRN, date of birth, ID band Patient awake    Reviewed: Allergy & Precautions, NPO status , Patient's Chart, lab work & pertinent test results  History of Anesthesia Complications Negative for: history of anesthetic complications  Airway Mallampati: II  TM Distance: <3 FB Neck ROM: Full    Dental   Pulmonary shortness of breath, asthma , sleep apnea ,    breath sounds clear to auscultation       Cardiovascular hypertension, + angina + Past MI and +CHF  + dysrhythmias + pacemaker  Rhythm:Regular Rate:Normal  Off Xarelto since Friday LV EF: 50% - 55%  ------------------------------------------------------------------- Indications: Shortness of breath (R06.02).  ------------------------------------------------------------------- History: PMH: Syncope. Congestive heart failure. Stroke. Risk factors: Pulmonary embolus. Pulmonary hypertension. DVT. Venous insufficiency. OSA. Asthma. PUD. GERD. DJD. Osteoarthritis. Renal insufficiency. UTI. Sarcoidosis. Anxiety. Hypertension. Dyslipidemia.  ------------------------------------------------------------------- Study Conclusions  - Left ventricle: The cavity size was normal. Wall thickness was increased in a pattern of mild LVH. Systolic function was normal. The estimated ejection fraction was in the range of 50% to 55%. Wall motion was normal; there were no regional wall motion abnormalities. - Ventricular septum: Septal motion showed abnormal function and dyssynergy. These changes are consistent with right ventricular pacing. - Pulmonary arteries: Systolic pressure was mildly increased. PA peak pressure: 33 mm Hg (S).  Impressions:  - Technically difficult; normal to mildly reduced LV function (EF 50); trace MR and TR; mildly elevated pulmonary pressure.  ------------------------------------------------------------------- Labs,  prior tests, procedures, and surgery: Transthoracic echocardiography (05/19/2014). EF was 50% and PA pressure was 32 (systolic).  Permanent pacemaker system implantation. Transthoracic echocardiography. M-mode, complete 2D, spectral    Neuro/Psych CVA    GI/Hepatic PUD, GERD  ,  Endo/Other    Renal/GU Renal disease     Musculoskeletal  (+) Arthritis ,   Abdominal   Peds  Hematology   Anesthesia Other Findings   Reproductive/Obstetrics                            Anesthesia Physical Anesthesia Plan  ASA: III  Anesthesia Plan: Spinal   Post-op Pain Management:    Induction: Intravenous  Airway Management Planned: Natural Airway  Additional Equipment:   Intra-op Plan:   Post-operative Plan:   Informed Consent: I have reviewed the patients History and Physical, chart, labs and discussed the procedure including the risks, benefits and alternatives for the proposed anesthesia with the patient or authorized representative who has indicated his/her understanding and acceptance.   Dental advisory given  Plan Discussed with: CRNA and Surgeon  Anesthesia Plan Comments:         Anesthesia Quick Evaluation

## 2016-04-03 NOTE — Transfer of Care (Signed)
Immediate Anesthesia Transfer of Care Note  Patient: [name] [name]  Procedure(s) Performed: Procedure(s): RIGHT TOTAL KNEE ARTHROPLASTY (Right)  Patient Location: PACU  Anesthesia Type:Spinal  Level of Consciousness: awake  Airway & Oxygen Therapy: Patient Spontanous Breathing and Patient connected to face mask oxygen  Post-op Assessment: Report given to RN and Post -op Vital signs reviewed and stable  Post vital signs: Reviewed and stable  Last Vitals:  Filed Vitals:   04/03/16 0730  BP: 153/77  Pulse: 87  Temp: 36.8 C  Resp: 18    Complications: No apparent anesthesia complications

## 2016-04-03 NOTE — H&P (View-Only) (Signed)
[name] [name] DOB: [birth year] Married / Language: English / Race: White Female Date of Admission:  04/03/2016 CC:  Right Knee Pain History of Present Illness  The patient is a 80 year old female who comes in for a preoperative History and Physical. The patient is scheduled for a right total knee arthroplasty to be performed by Dr. Dione [name]. Aluisio, MD at [facility] on 04/03/2016. [name] [name] comes in today with a chief complaint of right knee pain. She reports she has had increased right knee pain for the last two months. She states most of her pain is along the lateral aspect and in the popliteal space. It is very stiff in the morning when she first gets up. When she starts to walk she feels some relief. No previous injections in the right knee. No previous surgery. No injury and no falls. She denies groin pain. No numbness or tingling in her legs. She has a history of a left total knee replacement almost two years ago. She has done very well with that. She said that this is one of the best things she has ever done. She does walk daily, she walks around the house and in the yard. The right knee is bone on bone in the lateral and patellofemoral compartments with a valgus deformity noted. She is nearly two years out from her left total knee. She has done very well with that. She should continue her active lifestyle and the right knee is holding her back. She has done well with the left knee and now is ready to proceed with the right knee. They have been treated conservatively in the past for the above stated problem and despite conservative measures, they continue to have progressive pain and severe functional limitations and dysfunction. They have failed non-operative management including home exercise, medications, and injections. It is felt that they would benefit from undergoing total joint replacement. Risks and benefits of the procedure have been discussed with the patient and they elect to  proceed with surgery. There are no active contraindications to surgery such as ongoing infection or rapidly progressive neurological disease.   Problem List/Past Medical Status post total left knee replacement [id number]  Primary osteoarthritis of right knee (M17.11)  Anxiety Disorder   High blood pressure  Congestive Heart Failure  Coronary Artery Disease/Heart Disease  Bleeding disorder  Chronic Obstructive Lung Disease  Myocardial infarction  2012 Cerebrovascular Accident  2004 Pacemaker  Gastroesophageal Reflux Disease  Blood Clot  DVTs History of Pulmonary Embolism  Heart murmur  Hypercholesterolemia  Osteoarthritis  Sleep Apnea  no CPAP, just nasal oxygen Cervical Cancer  Allergic Rhinitis  Sarcoidosis  Mild Pulmonary Hypertension  Paroxsymal Atrial Fib  Venous Insufficiency  Renal Insufficiency  History of Peptic Ulcer Disease  Hemorrhoids  Left Bundle Branch Block  Allergies Sulfa Drugs  "dries me out" Imitrex *Migraine Products**   Family History Heart Disease  mother, father and brother Heart disease in female family member before age 40  Heart disease in female family member before age 10  Cancer  sister Depression  mother Diabetes Mellitus  mother  Social History Alcohol use  never consumed alcohol Children  4 Current work status  retired Engineer, agricultural (Currently)  no Drug/Alcohol Rehab (Previously)  no Exercise  Exercises rarely; does other Illicit drug use  no Living situation  live alone Marital status  widowed Number of flights of stairs before winded  2-3 Pain Contract  no Tobacco / smoke exposure  no Tobacco use  never smoker  Medication History  Tylenol Extra Strength (500MG  Tablet, Oral) Active. ZyrTEC Allergy (Oral) Specific strength unknown - Active. Tranxene-T (Oral) Specific strength unknown - Active. Flovent HFA (Inhalation) Specific strength unknown - Active. Lasix (Oral)  Specific strength unknown - Active. DuoNeb (Inhalation) Specific strength unknown - Active. Toprol XL (Oral) Specific strength unknown - Active. Multivitamins (Oral) Specific strength unknown - Active. Protegra (Oral) Specific strength unknown - Active. PriLOSEC (Oral) Specific strength unknown - Active. Systane Ultra (Ophthalmic) Specific strength unknown - Active. Pravachol (Oral) Specific strength unknown - Active. Senokot S (Oral) Specific strength unknown - Active. Silvadene (1% Cream, External) Active.   Past Surgical History  Appendectomy  Date: 1966. Arthroscopy of Knee  left Dilation and Curettage of Uterus  Gallbladder Surgery  Date: 01/1996. laporoscopic Heart Stents  Hemorrhoidectomy  Hysterectomy  partial (non-cancerous), 1970's Spinal Surgery  Lumbar Disc Surgery x2, 1990 Tubal Ligation  IVC Filter Placement  Date: 07/2010. Total Knee Replacement - Left  Date: 04/21/2013. Pacemaker Placement  Date: 07/2008. Right Shoulder Surgery  Date: 01/2009. Decompression Left Ulnar Nerve  Date: 02/2008. Cardiac Cath  x 2, 2004, 2009   Review of Systems General Not Present- Chills, Fatigue, Fever, Memory Loss, Night Sweats, Weight Gain and Weight Loss. Skin Not Present- Eczema, Hives, Itching, Lesions and Rash. HEENT Not Present- Dentures, Double Vision, Headache, Hearing Loss, Tinnitus and Visual Loss. Respiratory Not Present- Allergies, Chronic Cough, Coughing up blood, Shortness of breath at rest and Shortness of breath with exertion. Cardiovascular Not Present- Chest Pain, Difficulty Breathing Lying Down, Murmur, Palpitations, Racing/skipping heartbeats and Swelling. Gastrointestinal Not Present- Abdominal Pain, Bloody Stool, Constipation, Diarrhea, Difficulty Swallowing, Heartburn, Jaundice, Loss of appetitie, Nausea and Vomiting. Female Genitourinary Not Present- Blood in Urine, Discharge, Flank Pain, Incontinence, Painful Urination, Urgency, Urinary  frequency, Urinary Retention, Urinating at Night and Weak urinary stream. Musculoskeletal Not Present- Back Pain, Joint Pain, Joint Swelling, Morning Stiffness, Muscle Pain, Muscle Weakness and Spasms. Neurological Not Present- Blackout spells, Difficulty with balance, Dizziness, Paralysis, Tremor and Weakness. Psychiatric Not Present- Insomnia.  Vitals  Weight: 187 lb Height: 64.5in Weight was reported by patient. Height was reported by patient. Body Surface Area: 1.91 m Body Mass Index: 31.6 kg/m  Pulse: 76 (Regular)  BP: 132/82 (Sitting, Right Arm, Standard  Physical Exam General Mental Status -Alert, cooperative and good historian. General Appearance-pleasant, Not in acute distress. Orientation-Oriented X3. Build & Nutrition-Well nourished and Well developed.  Head and Neck Head-normocephalic, atraumatic . Neck Global Assessment - supple, no bruit auscultated on the right, no bruit auscultated on the left.  Eye Vision-Wears corrective lenses. Pupil - Bilateral-Regular and Round. Motion - Bilateral-EOMI.  ENMT Note: bilateral hearing aids   Chest and Lung Exam Auscultation Breath sounds - clear at anterior chest wall and clear at posterior chest wall. Adventitious sounds - No Adventitious sounds.  Cardiovascular Auscultation Rhythm - Regular rate and rhythm. Heart Sounds - S1 WNL and S2 WNL. Murmurs & Other Heart Sounds - Auscultation of the heart reveals - No Murmurs.  Abdomen Palpation/Percussion Tenderness - Abdomen is non-tender to palpation. Rigidity (guarding) - Abdomen is soft. Auscultation Auscultation of the abdomen reveals - Bowel sounds normal.  Female Genitourinary Note: Not done, not pertinent to present illness   Musculoskeletal Note: On exam, she is alert and oriented, in no apparent distress. Her right knee shows no effusion. Her range of motion of the right knee is about 5 to 125. She is very tender medially. There  is no lateral tenderness or  instability.  RADIOGRAPHS She has got bone-on-bone arthritis, medial and patellofemoral.  Assessment & Plan Primary osteoarthritis of right knee (M17.11)  Note:Surgical Plans: Right Total Knee Replacement  Disposition: Home  PCP: Dr. Marin Olp   Topical TXA - CVA, MI  Anesthesia Issues: None  Signed electronically by Ok Edwards, III PA-C

## 2016-04-03 NOTE — Evaluation (Signed)
Physical Therapy Evaluation Patient Details Name: [name] [name] MRN: [id number] DOB: Sep 16, 1936 Today's Date: 04/03/2016   History of Present Illness  r tka  Clinical Impression  The patient  Ambulated 30'. Plans DC home with virtual therpay. Daughter is staying w/ patientt x 2 weeks. Patient will benefit from PT  To address problems listed in the note below.    Follow Up Recommendations  (virtual therapy)    Equipment Recommendations  None recommended by PT    Recommendations for Other Services       Precautions / Restrictions Precautions Precautions: Fall;Knee Required Braces or Orthoses: Knee Immobilizer - Right Knee Immobilizer - Right: Discontinue once straight leg raise with < 10 degree lag      Mobility  Bed Mobility Overal bed mobility: Needs Assistance Bed Mobility: Supine to Sit     Supine to sit: Mod assist     General bed mobility comments: moderate assist , the mattress  edge is higher than  than the middle: therefore, patient had difficulty getting to the edge.  assist with legs  back onto the bed.  Transfers Overall transfer level: Needs assistance Equipment used: Rolling walker (2 wheeled) Transfers: Sit to/from Stand Sit to Stand: Min assist         General transfer comment: multimodal cues for safety and technique , cues for safety and to slow down  Ambulation/Gait Ambulation/Gait assistance: Min assist Ambulation Distance (Feet): 30 Feet Assistive device: Rolling walker (2 wheeled) Gait Pattern/deviations: Step-to pattern;Step-through pattern     General Gait Details: multimodal cues for sequence and position inside of the RW. patient talking nonstop. cues to redirect.  Stairs            Wheelchair Mobility    Modified Rankin (Stroke Patients Only)       Balance                                             Pertinent Vitals/Pain Pain Assessment: 0-10 Pain Score: 2  Pain Location: r KNEE Pain  Descriptors / Indicators: Discomfort Pain Intervention(s): Monitored during session;Premedicated before session;Ice applied    Home Living Family/patient expects to be discharged to:: Private residence Living Arrangements: Children Available Help at Discharge: Family Type of Home: House Home Access: Stairs to enter Entrance Stairs-Rails: Psychiatric nurse of Steps: 1 Home Layout: One level Home Equipment: Cane - single point      Prior Function Level of Independence: Independent with assistive device(s)               Hand Dominance        Extremity/Trunk Assessment   Upper Extremity Assessment: Defer to OT evaluation           Lower Extremity Assessment: RLE deficits/detail RLE Deficits / Details: ABLE  TO RAISE THE LEG    Cervical / Trunk Assessment: Normal  Communication   Communication: No difficulties  Cognition Arousal/Alertness: Awake/alert Behavior During Therapy: Restless;Impulsive Overall Cognitive Status: Within Functional Limits for tasks assessed                      General Comments      Exercises        Assessment/Plan    PT Assessment Patient needs continued PT services  PT Diagnosis Difficulty walking   PT Problem List Decreased strength;Decreased range of motion;Decreased activity tolerance;Decreased mobility;Decreased  knowledge of precautions;Decreased safety awareness;Decreased knowledge of use of DME  PT Treatment Interventions DME instruction;Gait training;Functional mobility training;Therapeutic activities;Therapeutic exercise;Patient/family education   PT Goals (Current goals can be found in the Care Plan section) Acute Rehab PT Goals Patient Stated Goal: to go home and have therapy PT Goal Formulation: With patient/family Time For Goal Achievement: 04/07/16 Potential to Achieve Goals: Good    Frequency 7X/week   Barriers to discharge        Co-evaluation               End of Session  Equipment Utilized During Treatment: Gait belt Activity Tolerance: Patient tolerated treatment well Patient left: in bed;with call bell/phone within reach;with bed alarm set;with family/visitor present Nurse Communication: Mobility status         Time: [id number] PT Time Calculation (min) (ACUTE ONLY): 32 min   Charges:   PT Evaluation $PT Eval Low Complexity: 1 Procedure PT Treatments $Gait Training: 8-22 mins   PT G Codes:        [name] 04/03/2016, 5:44 PM Tresa Endo PT [phone]

## 2016-04-03 NOTE — Anesthesia Procedure Notes (Signed)
Spinal Patient location during procedure: OR Start time: 04/03/2016 10:05 AM End time: 04/03/2016 10:11 AM Staffing Anesthesiologist: Jerrin Recore Performed by: anesthesiologist  Preanesthetic Checklist Completed: patient identified, site marked, surgical consent, pre-op evaluation, timeout performed, IV checked, risks and benefits discussed and monitors and equipment checked Spinal Block Patient position: sitting Prep: Betadine Patient monitoring: heart rate, cardiac monitor, continuous pulse ox and blood pressure Approach: midline Location: L2-3 Injection technique: single-shot Needle Needle type: Quincke  Needle gauge: 25 G Needle length: 9 cm Needle insertion depth: 4 cm Assessment Sensory level: T6 Additional Notes Tolerated well

## 2016-04-03 NOTE — Progress Notes (Signed)
Pt refused CPAP QHS.  Pt states that she doesn't wear it at home and does not wish to wear it here.  RT will continue to monitor as needed.

## 2016-04-03 NOTE — Anesthesia Postprocedure Evaluation (Signed)
Anesthesia Post Note  Patient: [name] [name]  Procedure(s) Performed: Procedure(s) (LRB): RIGHT TOTAL KNEE ARTHROPLASTY (Right)  Patient location during evaluation: PACU Anesthesia Type: Spinal Level of consciousness: oriented and awake and alert Pain management: pain level controlled Vital Signs Assessment: post-procedure vital signs reviewed and stable Respiratory status: spontaneous breathing, respiratory function stable and patient connected to nasal cannula oxygen Cardiovascular status: blood pressure returned to baseline and stable Postop Assessment: no headache, no backache and spinal receding Anesthetic complications: no    Last Vitals:  Filed Vitals:   04/03/16 1234 04/03/16 1328  BP: 146/61 144/58  Pulse: 64 67  Temp: 36.6 C   Resp: 14 15    Last Pain:  Filed Vitals:   04/03/16 1355  PainSc: 8                  Malacki Mcphearson,JAMES TERRILL

## 2016-04-03 NOTE — Op Note (Signed)
Pre-operative diagnosis- Osteoarthritis  Right knee(s)  Post-operative diagnosis- Osteoarthritis Right knee(s)  Procedure-  Right  Total Knee Arthroplasty  Surgeon- Dione Plover. Hasana Alcorta, MD  Assistant- Molli Barrows, PA-C   Anesthesia-  Spinal  EBL-* No blood loss amount entered *   Drains Hemovac  Tourniquet time-31 minutes @ [id number] mm Hg  Complications- None  Condition-PACU - hemodynamically stable.   Brief Clinical Note  [name] [name] is a 80 y.o. year old female with end stage OA of her left knee with progressively worsening pain and dysfunction. She has constant pain, with activity and at rest and significant functional deficits with difficulties even with ADLs. She has had extensive non-op management including analgesics, injections of cortisone and viscosupplements, and home exercise program, but remains in significant pain with significant dysfunction. Radiographs show bone on bone arthritis lateral and patellofemoral. She presents now for left Total Knee Arthroplasty.     Procedure in detail---   The patient is brought into the operating room and positioned supine on the operating table. After successful administration of  Spinal,   a tourniquet is placed high on the  Right thigh(s) and the lower extremity is prepped and draped in the usual sterile fashion. Time out is performed by the operating team and then the  Right lower extremity is wrapped in Esmarch, knee flexed and the tourniquet inflated to 300 mmHg.       A midline incision is made with a ten blade through the subcutaneous tissue to the level of the extensor mechanism. A fresh blade is used to make a medial parapatellar arthrotomy. Soft tissue over the proximal medial tibia is subperiosteally elevated to the joint line with a knife and into the semimembranosus bursa with a Cobb elevator. Soft tissue over the proximal lateral tibia is elevated with attention being paid to avoiding the patellar tendon on the tibial tubercle.  The patella is everted, knee flexed 90 degrees and the ACL and PCL are removed. Findings are bone on bone lateral and patellofemoral.        The drill is used to create a starting hole in the distal femur and the canal is thoroughly irrigated with sterile saline to remove the fatty contents. The 5 degree Right  valgus alignment guide is placed into the femoral canal and the distal femoral cutting block is pinned to remove 10 mm off the distal femur. Resection is made with an oscillating saw.      The tibia is subluxed forward and the menisci are removed. The extramedullary alignment guide is placed referencing proximally at the medial aspect of the tibial tubercle and distally along the second metatarsal axis and tibial crest. The block is pinned to remove 80mm off the more deficient lateral  side. Resection is made with an oscillating saw. Size 3is the most appropriate size for the tibia and the proximal tibia is prepared with the modular drill and keel punch for that size.      The femoral sizing guide is placed and size 3 is most appropriate. Rotation is marked off the epicondylar axis and confirmed by creating a rectangular flexion gap at 90 degrees. The size 3 cutting block is pinned in this rotation and the anterior, posterior and chamfer cuts are made with the oscillating saw. The intercondylar block is then placed and that cut is made.      Trial size 3 tibial component, trial size 3 posterior stabilized femur and a 10  mm posterior stabilized rotating platform insert trial  is placed. Full extension is achieved with excellent varus/valgus and anterior/posterior balance throughout full range of motion. The patella is everted and thickness measured to be 22  mm. Free hand resection is taken to 12 mm, a 35 template is placed, lug holes are drilled, trial patella is placed, and it tracks normally. Osteophytes are removed off the posterior femur with the trial in place. All trials are removed and the cut bone  surfaces prepared with pulsatile lavage. Cement is mixed and once ready for implantation, the size 3 tibial implant, size  3 posterior stabilized femoral component, and the size 35 patella are cemented in place and the patella is held with the clamp. The trial insert is placed and the knee held in full extension. The Exparel (20 ml mixed with 30 ml saline) and .25% Bupivicaine, are injected into the extensor mechanism, posterior capsule, medial and lateral gutters and subcutaneous tissues.  All extruded cement is removed and once the cement is hard the permanent 10 mm posterior stabilized rotating platform insert is placed into the tibial tray.      The wound is copiously irrigated with saline solution and the extensor mechanism closed over a hemovac drain with #1 V-loc suture. The tourniquet is released for a total tourniquet time of 31  minutes. Flexion against gravity is 140 degrees and the patella tracks normally. Subcutaneous tissue is closed with 2.0 vicryl and subcuticular with running 4.0 Monocryl. The incision is cleaned and dried and steri-strips and a bulky sterile dressing are applied. The limb is placed into a knee immobilizer and the patient is awakened and transported to recovery in stable condition.      Please note that a surgical assistant was a medical necessity for this procedure in order to perform it in a safe and expeditious manner. Surgical assistant was necessary to retract the ligaments and vital neurovascular structures to prevent injury to them and also necessary for proper positioning of the limb to allow for anatomic placement of the prosthesis.   Dione Plover Harmonii Karle, MD    04/03/2016, 11:06 AM

## 2016-04-04 LAB — BASIC METABOLIC PANEL
ANION GAP: 9 (ref 5–15)
BUN: 10 mg/dL (ref 6–20)
CALCIUM: 9.2 mg/dL (ref 8.9–10.3)
CHLORIDE: 109 mmol/L (ref 101–111)
CO2: 28 mmol/L (ref 22–32)
Creatinine, Ser: 0.85 mg/dL (ref 0.44–1.00)
GFR calc non Af Amer: >60 mL/min (ref >60)
GLUCOSE: 142 mg/dL — AB (ref 65–99)
Potassium: 4.6 mmol/L (ref 3.5–5.1)
Sodium: 146 mmol/L — ABNORMAL HIGH (ref 135–145)

## 2016-04-04 LAB — CBC
HEMATOCRIT: 32.4 % — AB (ref 36.0–46.0)
HEMOGLOBIN: 10.8 g/dL — AB (ref 12.0–15.0)
MCH: 30.3 pg (ref 26.0–34.0)
MCHC: 33.3 g/dL (ref 30.0–36.0)
MCV: 90.8 fL (ref 78.0–100.0)
Platelets: 135 10*3/uL — ABNORMAL LOW (ref 150–400)
RBC: 3.57 MIL/uL — ABNORMAL LOW (ref 3.87–5.11)
RDW: 13.4 % (ref 11.5–15.5)
WBC: 10.5 10*3/uL (ref 4.0–10.5)

## 2016-04-04 MED ORDER — TRAMADOL HCL 50 MG PO TABS: 50.0000 mg | ORAL_TABLET | Freq: Four times a day (QID) | ORAL | Status: DC | PRN

## 2016-04-04 MED ORDER — METHOCARBAMOL 500 MG PO TABS: 500.0000 mg | ORAL_TABLET | Freq: Four times a day (QID) | ORAL | Status: DC | PRN

## 2016-04-04 MED ORDER — OXYCODONE HCL 5 MG PO TABS: 5.0000 mg | ORAL_TABLET | ORAL | Status: DC | PRN

## 2016-04-04 NOTE — Care Management Note (Signed)
Case Management Note  Patient Details  Name: [name] [name] MRN: [id number] Date of Birth: 1936/03/02  Subjective/Objective:      80 yo admitted with total knee replacement              Action/Plan: From home with daughter  Expected Discharge Date:  04/05/16               Expected Discharge Plan:  Home/Self Care  In-House Referral:     Discharge planning Services  CM Consult  Post Acute Care Choice:    Choice offered to:  Patient  DME Arranged:    DME Agency:     HH Arranged:    [place] Agency:     Status of Service:  In process, will continue to follow  Medicare Important Message Given:    Date Medicare IM Given:    Medicare IM give by:    Date Additional Medicare IM Given:    Additional Medicare Important Message give by:     If discussed at [place] of Stay Meetings, dates discussed:    Additional Comments: This CM met with pt at bedside to discuss dc plan.  Pt states she has been set up for virtual therapy by her MD office.  She states that she has all her equipment at home from having her previous knee done.  No other Cm needs communicated.  Lynnell Catalan, RN 04/04/2016, 2:55 PM [phone]

## 2016-04-04 NOTE — Progress Notes (Signed)
Physical Therapy Treatment Patient Details Name: [name] [name] MRN: [id number] DOB: 25-Dec-1935 Today's Date: 04/04/2016    History of Present Illness r tka    PT Comments    The patient is impulsive, requires cues frequently for safety. Plans Virtual therapy. Daughter not present  Follow Up Recommendations   (virtual)     Equipment Recommendations  None recommended by PT    Recommendations for Other Services       Precautions / Restrictions Precautions Precautions: Fall;Knee Precaution Comments: decreased safety Required Braces or Orthoses: Knee Immobilizer - Right Knee Immobilizer - Right: Discontinue once straight leg raise with < 10 degree lag Restrictions Other Position/Activity Restrictions: WBAT    Mobility  Bed Mobility Overal bed mobility: Needs Assistance Bed Mobility: Supine to Sit     Supine to sit: Min assist     General bed mobility comments: cuues for safety, trying to get up with ice inside KI. multimoal cues to slow down and listen for instructions.  Transfers Overall transfer level: Needs assistance Equipment used: Rolling walker (2 wheeled) Transfers: Sit to/from Stand Sit to Stand: Min assist         General transfer comment: multimodal cues for safety and technique , cues for safety and to slow down  Ambulation/Gait Ambulation/Gait assistance: Min assist Ambulation Distance (Feet): 200 Feet Assistive device: Rolling walker (2 wheeled) Gait Pattern/deviations: Step-to pattern;Decreased stance time - right     General Gait Details: multimodal cues for sequence and position inside of the RW. patient talking nonstop. cues to redirect. cues for turning, gets outside of the RW.   Stairs            Wheelchair Mobility    Modified Rankin (Stroke Patients Only)       Balance                                    Cognition Arousal/Alertness: Awake/alert Behavior During Therapy: Impulsive Overall Cognitive  Status: Impaired/Different from baseline Area of Impairment: Safety/judgement     Memory: Decreased recall of precautions   Safety/Judgement: Decreased awareness of safety          Exercises Total Joint Exercises Ankle Circles/Pumps: AROM;Both;10 reps Quad Sets: AROM;Both;10 reps Short Arc Quad: AROM;Right;10 reps Heel Slides: AROM;Right;10 reps Hip ABduction/ADduction: AROM;Right;10 reps Straight Leg Raises: AROM;Right;10 reps Goniometric ROM: 10-70 R knee    General Comments        Pertinent Vitals/Pain Pain Assessment: 0-10 Pain Score: 2  Pain Location: R knee Pain Descriptors / Indicators: Aching;Sore Pain Intervention(s): Limited activity within patient's tolerance;Monitored during session    [place] expects to be discharged to:: Private residence Living Arrangements: Children Available Help at Discharge: Family;Available 24 hours/day (daughter x 2 weeks, son x 1 week, other son x 1 week) Type of Home: House Home Access: Stairs to enter Entrance Stairs-Rails: Right;Left Home Layout: One level Home Equipment: Cane - single point;Grab bars - tub/shower;Hand held shower head;Adaptive equipment;Shower seat - built in      Prior Function Level of Independence: Independent with assistive device(s)          PT Goals (current goals can now be found in the care plan section) Acute Rehab PT Goals Patient Stated Goal: to go home Progress towards PT goals: Progressing toward goals    Frequency  7X/week    PT Plan Current plan remains appropriate    Co-evaluation  End of Session Equipment Utilized During Treatment: Gait belt Activity Tolerance: Patient tolerated treatment well Patient left: in chair;with call bell/phone within reach;with chair alarm set     Time: [id number] PT Time Calculation (min) (ACUTE ONLY): 43 min  Charges:  $Gait Training: 8-22 mins $Therapeutic Exercise: 8-22 mins $Self Care/Home Management:  8-22                    G Codes:      [name] 04/04/2016, 5:38 PM Tresa Endo PT [phone]

## 2016-04-04 NOTE — Progress Notes (Signed)
Subjective: 1 Day Post-Op Procedure(s) (LRB): RIGHT TOTAL KNEE ARTHROPLASTY (Right) Patient reports pain as mild.  Much less pain than with first surgery Walked yesterday with PT Plan is to go Home after hospital stay.  Objective: Vital signs in last 24 hours: Temp:  [97.4 F (36.3 C)-98.9 F (37.2 C)] 98.2 F (36.8 C) (04/18 0635) Pulse Rate:  [60-87] 73 (04/18 0635) Resp:  [8-18] 16 (04/18 0635) BP: (110-153)/(53-77) 121/53 mmHg (04/18 0635) SpO2:  [93 %-100 %] 96 % (04/18 0635) Weight:  [86.183 kg (190 lb)] 86.183 kg (190 lb) (04/17 1234)  Intake/Output from previous day:  Intake/Output Summary (Last 24 hours) at 04/04/16 0710 Last data filed at 04/04/16 0618  Gross per 24 hour  Intake 3856.25 ml  Output   2560 ml  Net 1296.25 ml    Intake/Output this shift:    Labs:  Recent Labs  04/04/16 0411  HGB 10.8*    Recent Labs  04/04/16 0411  WBC 10.5  RBC 3.57*  HCT 32.4*  PLT 135*    Recent Labs  04/04/16 0411  NA 146*  K 4.6  CL 109  CO2 28  BUN 10  CREATININE 0.85  GLUCOSE 142*  CALCIUM 9.2    Recent Labs  04/03/16 0748  INR 0.96    EXAM General - Patient is Alert, Appropriate and Oriented Extremity - Neurologically intact Neurovascular intact No cellulitis present Compartment soft Dressing - no drainage Motor Function - intact, moving foot and toes well on exam.  Hemovac pulled without difficulty.  Past Medical History  Diagnosis Date  . Allergic rhinitis   . Sarcoidosis ([place])   . Cardiac pacemaker in situ     DOI  08-04-2008  medtronic dual chamber  . Hyperlipidemia   . Shortness of breath     OCCASIONAL  . GERD (gastroesophageal reflux disease)   . History of pulmonary embolus (PE)   . Hypertension   . History of CVA (cerebrovascular accident)     09-15-2005  Left Cerebellar Hematoma with mild stem compression whild on coumadin for DVT  . Venous insufficiency   . History of DVT of lower extremity   . Anxiety   .  Renal insufficiency   . VIN III (vulvar intraepithelial neoplasia III)   . Coronary artery disease CARDIOLOGIST-- DR Cristopher [place]    per cath-- Non-obstructive minimal CAD  . Chronic combined systolic and diastolic CHF, NYHA class 2 ([place])     montior by cardiologist-- dr taylor--  ef 45%  . History of non-ST elevation myocardial infarction (NSTEMI)     2011-- in setting of bilateral PE's and DVT's and CHF  . PAF (paroxysmal atrial fibrillation) ([place])   . Mild pulmonary hypertension ([place])   . History of cervical dysplasia     CIN III  . Thrombophilia ([place])     hemotologist--  dr Marin Olp--  monitors pt anitcoagulation  . Chronic anticoagulation   . History of peptic ulcer disease   . OA (osteoarthritis)   . Moderate intermittent asthma   . LBBB (left bundle branch block)   . Urgency of urination   . Incontinence of urine   . Fluid retention   . Mild obstructive sleep apnea     PT USES Humidied Nasal Mask with O2 at2.5L (study 08-05-2009)    Assessment/Plan: 1 Day Post-Op Procedure(s) (LRB): RIGHT TOTAL KNEE ARTHROPLASTY (Right) Principal Problem:   OA (osteoarthritis) of knee   Advance diet Up with therapy D/C IV fluids Plan for discharge  tomorrow Doing virtual PT. Will not need home health  DVT Prophylaxis - Xarelto Weight-Bearing as tolerated to right leg D/C O2 and Pulse OX and try on Room Air  [name] [name] 04/04/2016, 7:10 AM

## 2016-04-04 NOTE — Progress Notes (Signed)
Pt refused CPAP qhs.  Pt wears nasal cannula at night while sleeping and wishes to do so here as well.  RT will continue to monitor as needed.

## 2016-04-04 NOTE — Progress Notes (Signed)
Physical Therapy Treatment Patient Details Name: [name] [name] MRN: [id number] DOB: 1936/05/11 Today's Date: 04/04/2016    History of Present Illness r tka    PT Comments    Patient is impulsive at times. Plans DC tomorrow.  Follow Up Recommendations   (virtual)     Equipment Recommendations  None recommended by PT    Recommendations for Other Services       Precautions / Restrictions Precautions Precautions: Fall;Knee Precaution Comments: decreased safety Required Braces or Orthoses: Knee Immobilizer - Right Knee Immobilizer - Right: Discontinue once straight leg raise with < 10 degree lag Restrictions Other Position/Activity Restrictions: WBAT    Mobility  Bed Mobility Overal bed mobility: Needs Assistance Bed Mobility: Sit to Supine     Supine to sit: Min assist Sit to supine: Min assist   General bed mobility comments: assist with R leg  Transfers Overall transfer level: Needs assistance Equipment used: Rolling walker (2 wheeled) Transfers: Sit to/from Stand Sit to Stand: Min assist         General transfer comment: multimodal cues for safety and technique , cues for safety and to slow down  Ambulation/Gait Ambulation/Gait assistance: Min assist Ambulation Distance (Feet): 150 Feet Assistive device: Rolling walker (2 wheeled) Gait Pattern/deviations: Step-to pattern;Antalgic     General Gait Details: multimodal cues for sequence and position inside of the RW. patient talking nonstop. cues to redirect. cues for turning, gets outside of the RW.   Stairs            Wheelchair Mobility    Modified Rankin (Stroke Patients Only)       Balance                                    Cognition Arousal/Alertness: Awake/alert Behavior During Therapy: Impulsive Overall Cognitive Status: Impaired/Different from baseline Area of Impairment: Safety/judgement (was trying to get up earlier)     Memory: Decreased recall of  precautions   Safety/Judgement: Decreased awareness of safety          Exercises   General Comments        Pertinent Vitals/Pain Pain Assessment: 0-10 Pain Score: 5  Pain Location: R knee Pain Descriptors / Indicators: Tender;Discomfort;Grimacing;Pressure Pain Intervention(s): Limited activity within patient's tolerance;Monitored during session;Premedicated before session;Repositioned    Home Living Family/patient expects to be discharged to:: Private residence Living Arrangements: Children Available Help at Discharge: Family;Available 24 hours/day (daughter x 2 weeks, son x 1 week, other son x 1 week) Type of Home: House Home Access: Stairs to enter Entrance Stairs-Rails: Right;Left Home Layout: One level Home Equipment: Cane - single point;Grab bars - tub/shower;Hand held shower head;Adaptive equipment;Shower seat - built in      Prior Function Level of Independence: Independent with assistive device(s)          PT Goals (current goals can now be found in the care plan section) Acute Rehab PT Goals Patient Stated Goal: to go home Progress towards PT goals: Progressing toward goals    Frequency  7X/week    PT Plan Current plan remains appropriate    Co-evaluation             End of Session Equipment Utilized During Treatment: Gait belt Activity Tolerance: Patient tolerated treatment well Patient left: in bed;with call bell/phone within reach ([employer] in room, )     Time: [id number] PT Time Calculation (min) (ACUTE ONLY): 20 min  Charges:  $Gait Training: 8-22 mins $Therapeutic Exercise: 8-22 mins $Self Care/Home Management: 8-22                    G Codes:      [name] [name] 04/04/2016, 5:43 PM

## 2016-04-04 NOTE — Discharge Instructions (Signed)
° °Dr. Frank Aluisio °Total Joint Specialist °[facility] °[address] °, [place] [address] °[phone] ° °TOTAL KNEE REPLACEMENT POSTOPERATIVE DIRECTIONS ° °Knee Rehabilitation, Guidelines Following Surgery  °Results after knee surgery are often greatly improved when you follow the exercise, range of motion and muscle strengthening exercises prescribed by your doctor. Safety measures are also important to protect the knee from further injury. Any time any of these exercises cause you to have increased pain or swelling in your knee joint, decrease the amount until you are comfortable again and slowly increase them. If you have problems or questions, call your caregiver or physical therapist for advice.  ° °HOME CARE INSTRUCTIONS  °Remove items at home which could result in a fall. This includes throw rugs or furniture in walking pathways.  °· ICE to the affected knee every three hours for 30 minutes at a time and then as needed for pain and swelling.  Continue to use ice on the knee for pain and swelling from surgery. You may notice swelling that will progress down to the foot and ankle.  This is normal after surgery.  Elevate the leg when you are not up walking on it.   °· Continue to use the breathing machine which will help keep your temperature down.  It is common for your temperature to cycle up and down following surgery, especially at night when you are not up moving around and exerting yourself.  The breathing machine keeps your lungs expanded and your temperature down. °· Do not place pillow under knee, focus on keeping the knee straight while resting ° °DIET °You may resume your previous home diet once your are discharged from the hospital. ° °DRESSING / WOUND CARE / SHOWERING °You may start showering once you are discharged home but do not submerge the incision under water. Just pat the incision dry and apply a dry gauze dressing on daily. °Change the surgical dressing  daily and reapply a dry dressing each time. ° °ACTIVITY °Walk with your walker as instructed. °Use walker as long as suggested by your caregivers. °Avoid periods of inactivity such as sitting longer than an hour when not asleep. This helps prevent blood clots.  °You may resume a sexual relationship in one month or when given the OK by your doctor.  °You may return to work once you are cleared by your doctor.  °Do not drive a car for 6 weeks or until released by you surgeon.  °Do not drive while taking narcotics. ° °WEIGHT BEARING °Weight bearing as tolerated with assist device (walker, cane, etc) as directed, use it as long as suggested by your surgeon or therapist, typically at least 4-6 weeks. ° °POSTOPERATIVE CONSTIPATION PROTOCOL °Constipation - defined medically as fewer than three stools per week and severe constipation as less than one stool per week. ° °One of the most common issues patients have following surgery is constipation.  Even if you have a regular bowel pattern at home, your normal regimen is likely to be disrupted due to multiple reasons following surgery.  Combination of anesthesia, postoperative narcotics, change in appetite and fluid intake all can affect your bowels.  In order to avoid complications following surgery, here are some recommendations in order to help you during your recovery period. ° °Colace (docusate) - Pick up an over-the-counter form of Colace or another stool softener and take twice a day as long as you are requiring postoperative pain medications.  Take with a full glass of water   daily.  If you experience loose stools or diarrhea, hold the colace until you stool forms back up.  If your symptoms do not get better within 1 week or if they get worse, check with your doctor. ° °Dulcolax (bisacodyl) - Pick up over-the-counter and take as directed by the product packaging as needed to assist with the movement of your bowels.  Take with a full glass of water.  Use this product as  needed if not relieved by Colace only.  ° °MiraLax (polyethylene glycol) - Pick up over-the-counter to have on hand.  MiraLax is a solution that will increase the amount of water in your bowels to assist with bowel movements.  Take as directed and can mix with a glass of water, juice, soda, coffee, or tea.  Take if you go more than two days without a movement. °Do not use MiraLax more than once per day. Call your doctor if you are still constipated or irregular after using this medication for 7 days in a row. ° °If you continue to have problems with postoperative constipation, please contact the office for further assistance and recommendations.  If you experience "the worst abdominal pain ever" or develop nausea or vomiting, please contact the office immediatly for further recommendations for treatment. ° °ITCHING ° If you experience itching with your medications, try taking only a single pain pill, or even half a pain pill at a time.  You can also use Benadryl over the counter for itching or also to help with sleep.  ° °TED HOSE STOCKINGS °Wear the elastic stockings on both legs for three weeks following surgery during the day but you may remove then at night for sleeping. ° °MEDICATIONS °See your medication summary on the “After Visit Summary” that the nursing staff will review with you prior to discharge.  You may have some home medications which will be placed on hold until you complete the course of blood thinner medication.  It is important for you to complete the blood thinner medication as prescribed by your surgeon.  Continue your approved medications as instructed at time of discharge. ° °PRECAUTIONS °If you experience chest pain or shortness of breath - call 911 immediately for transfer to the hospital emergency department.  °If you develop a fever greater that 101 F, purulent drainage from wound, increased redness or drainage from wound, foul odor from the wound/dressing, or calf pain - CONTACT YOUR  SURGEON.   °                                                °FOLLOW-UP APPOINTMENTS °Make sure you keep all of your appointments after your operation with your surgeon and caregivers. You should call the office at the above phone number and make an appointment for approximately two weeks after the date of your surgery or on the date instructed by your surgeon outlined in the "After Visit Summary". ° ° °RANGE OF MOTION AND STRENGTHENING EXERCISES  °Rehabilitation of the knee is important following a knee injury or an operation. After just a few days of immobilization, the muscles of the thigh which control the knee become weakened and shrink (atrophy). Knee exercises are designed to build up the tone and strength of the thigh muscles and to improve knee motion. Often times heat used for twenty to thirty minutes before working out will loosen   up your tissues and help with improving the range of motion but do not use heat for the first two weeks following surgery. These exercises can be done on a training (exercise) mat, on the floor, on a table or on a bed. Use what ever works the best and is most comfortable for you Knee exercises include:  °Leg Lifts - While your knee is still immobilized in a splint or cast, you can do straight leg raises. Lift the leg to 60 degrees, hold for 3 sec, and slowly lower the leg. Repeat 10-20 times 2-3 times daily. Perform this exercise against resistance later as your knee gets better.  °Quad and Hamstring Sets - Tighten up the muscle on the front of the thigh (Quad) and hold for 5-10 sec. Repeat this 10-20 times hourly. Hamstring sets are done by pushing the foot backward against an object and holding for 5-10 sec. Repeat as with quad sets.  °· Leg Slides: Lying on your back, slowly slide your foot toward your buttocks, bending your knee up off the floor (only go as far as is comfortable). Then slowly slide your foot back down until your leg is flat on the floor again. °· Angel Wings:  Lying on your back spread your legs to the side as far apart as you can without causing discomfort.  °A rehabilitation program following serious knee injuries can speed recovery and prevent re-injury in the future due to weakened muscles. Contact your doctor or a physical therapist for more information on knee rehabilitation.  ° °IF YOU ARE TRANSFERRED TO A SKILLED REHAB FACILITY °If the patient is transferred to a skilled rehab facility following release from the hospital, a list of the current medications will be sent to the facility for the patient to continue.  When discharged from the skilled rehab facility, please have the facility set up the patient's Home Health Physical Therapy prior to being released. Also, the skilled facility will be responsible for providing the patient with their medications at time of release from the facility to include their pain medication, the muscle relaxants, and their blood thinner medication. If the patient is still at the rehab facility at time of the two week follow up appointment, the skilled rehab facility will also need to assist the patient in arranging follow up appointment in our office and any transportation needs. ° °MAKE SURE YOU:  °Understand these instructions.  °Get help right away if you are not doing well or get worse.  ° ° °Pick up stool softner and laxative for home use following surgery while on pain medications. °Do not submerge incision under water. °Please use good hand washing techniques while changing dressing each day. °May shower starting three days after surgery. °Please use a clean towel to pat the incision dry following showers. °Continue to use ice for pain and swelling after surgery. °Do not use any lotions or creams on the incision until instructed by your surgeon. ° °

## 2016-04-04 NOTE — Progress Notes (Signed)
Occupational Therapy Evaluation Patient Details Name: [name] [name] MRN: [id number] DOB: 05/27/1936 Today's Date: 04/04/2016    History of Present Illness r tka   Clinical Impression   Patient presents to OT with decreased ADL independence and safety due to the deficits listed below. She will benefit from skilled OT to maximize function and to facilitate a safe discharge. OT will follow.     Follow Up Recommendations  No OT follow up;Supervision/Assistance - 24 hour    Equipment Recommendations  3 in 1 bedside comode    Recommendations for Other Services       Precautions / Restrictions Precautions Precautions: Fall;Knee Required Braces or Orthoses: Knee Immobilizer - Right Knee Immobilizer - Right: Discontinue once straight leg raise with < 10 degree lag Restrictions Other Position/Activity Restrictions: WBAT      Mobility Bed Mobility                  Transfers                      Balance                                            ADL Overall ADL's : Needs assistance/impaired Eating/Feeding: Independent;Sitting   Grooming: Set up;Sitting   Upper Body Bathing: Set up;Sitting   Lower Body Bathing: Moderate assistance;Sit to/from stand   Upper Body Dressing : Set up;Sitting   Lower Body Dressing: Moderate assistance;Sit to/from stand                 General ADL Comments: Patient up in recliner; declined OOB due to getting up with nurse tech recently for toileting and bathing tasks. Reviewed LB dressing techniques for home. Patient has reacher and stocking donner from previous surgery. States daughter can assist with LB self-care as well.      Vision     Perception     Praxis      Pertinent Vitals/Pain Pain Assessment: 0-10 Pain Score: 2  Pain Location: R knee Pain Descriptors / Indicators: Aching;Sore Pain Intervention(s): Limited activity within patient's tolerance;Monitored during session      Hand Dominance Right   Extremity/Trunk Assessment Upper Extremity Assessment Upper Extremity Assessment: Overall WFL for tasks assessed   Lower Extremity Assessment Lower Extremity Assessment: Defer to PT evaluation   Cervical / Trunk Assessment Cervical / Trunk Assessment: Normal   Communication Communication Communication: No difficulties   Cognition Arousal/Alertness: Awake/alert Behavior During Therapy: Anxious Overall Cognitive Status: Within Functional Limits for tasks assessed                     General Comments       Exercises       Shoulder Instructions      Home Living Family/patient expects to be discharged to:: Private residence Living Arrangements: Children Available Help at Discharge: Family;Available 24 hours/day (daughter x 2 weeks, son x 1 week, other son x 1 week) Type of Home: House Home Access: Stairs to enter [employer] of Steps: 1 Entrance Stairs-Rails: Right;Left Home Layout: One level     Bathroom Shower/Tub: Occupational psychologist: Handicapped height Bathroom Accessibility: Yes How Accessible: Accessible via walker Home Equipment: Cane - single point;Grab bars - tub/shower;Hand held shower head;Adaptive equipment;Shower seat - built in [employer], building services;Other (Comment) (stocking donner)  Prior Functioning/Environment Level of Independence: Independent with assistive device(s)             OT Diagnosis: Acute pain   OT Problem List: Decreased strength;Decreased range of motion;Impaired balance (sitting and/or standing);Decreased safety awareness;Decreased knowledge of use of DME or AE;Pain   OT Treatment/Interventions: Self-care/ADL training;DME and/or AE instruction;Therapeutic activities;Patient/family education    OT Goals(Current goals can be found in the care plan section) Acute Rehab OT Goals Patient Stated Goal: to go home OT Goal Formulation: With patient Time For  Goal Achievement: 04/18/16 Potential to Achieve Goals: Good ADL Goals Pt Will Perform Lower Body Bathing: with supervision;with adaptive equipment;sit to/from stand Pt Will Perform Lower Body Dressing: with supervision;with adaptive equipment;sit to/from stand Pt Will Transfer to Toilet: with supervision;ambulating Pt Will Perform Toileting - Clothing Manipulation and hygiene: with supervision;sit to/from stand Pt Will Perform Tub/Shower Transfer: Shower transfer;with supervision;ambulating;shower seat;grab bars;rolling walker  OT Frequency: Min 2X/week   Barriers to D/C:            Co-evaluation              End of Session    Activity Tolerance: Patient tolerated treatment well Patient left: in chair;with call bell/phone within reach;with chair alarm set   Time: [id number] OT Time Calculation (min): 13 min Charges:  OT General Charges $OT Visit: 1 Procedure OT Evaluation $OT Eval Low Complexity: 1 Procedure G-Codes:    Amai Cappiello A 05/01/16, 2:14 PM

## 2016-04-05 LAB — BASIC METABOLIC PANEL WITH GFR
Anion gap: 6 (ref 5–15)
BUN: 15 mg/dL (ref 6–20)
CO2: 29 mmol/L (ref 22–32)
Calcium: 8.5 mg/dL — ABNORMAL LOW (ref 8.9–10.3)
Chloride: 106 mmol/L (ref 101–111)
Creatinine, Ser: 0.89 mg/dL (ref 0.44–1.00)
GFR calc Af Amer: >60 mL/min
GFR calc non Af Amer: >60 mL/min
Glucose, Bld: 104 mg/dL — ABNORMAL HIGH (ref 65–99)
Potassium: 3.8 mmol/L (ref 3.5–5.1)
Sodium: 141 mmol/L (ref 135–145)

## 2016-04-05 LAB — CBC
HCT: 32.9 % — ABNORMAL LOW (ref 36.0–46.0)
Hemoglobin: 10.5 g/dL — ABNORMAL LOW (ref 12.0–15.0)
MCH: 30.1 pg (ref 26.0–34.0)
MCHC: 31.9 g/dL (ref 30.0–36.0)
MCV: 94.3 fL (ref 78.0–100.0)
Platelets: 130 K/uL — ABNORMAL LOW (ref 150–400)
RBC: 3.49 MIL/uL — ABNORMAL LOW (ref 3.87–5.11)
RDW: 14 % (ref 11.5–15.5)
WBC: 9.6 K/uL (ref 4.0–10.5)

## 2016-04-05 LAB — CUP PACEART REMOTE DEVICE CHECK
Brady Statistic AS VS Percent: 21 %
Date Time Interrogation Session: 20170321111900
Implantable Lead Location: 753859
Lead Channel Pacing Threshold Amplitude: 1.125 V
Lead Channel Pacing Threshold Pulse Width: 0.4 ms
Lead Channel Sensing Intrinsic Amplitude: 2.8 mV
Lead Channel Sensing Intrinsic Amplitude: 8 mV
Lead Channel Setting Pacing Pulse Width: 0.4 ms
Lead Channel Setting Sensing Sensitivity: 4 mV
MDC IDC LEAD IMPLANT DT: 20090818
MDC IDC LEAD IMPLANT DT: 20090818
MDC IDC LEAD LOCATION: 753860
MDC IDC MSMT BATTERY IMPEDANCE: 3052 Ohm
MDC IDC MSMT BATTERY REMAINING LONGEVITY: 18 mo
MDC IDC MSMT BATTERY VOLTAGE: 2.72 V
MDC IDC MSMT LEADCHNL RA IMPEDANCE VALUE: 386 Ohm
MDC IDC MSMT LEADCHNL RA PACING THRESHOLD AMPLITUDE: 0.625 V
MDC IDC MSMT LEADCHNL RA PACING THRESHOLD PULSEWIDTH: 0.4 ms
MDC IDC MSMT LEADCHNL RV IMPEDANCE VALUE: 524 Ohm
MDC IDC SET LEADCHNL RA PACING AMPLITUDE: 2 V
MDC IDC SET LEADCHNL RV PACING AMPLITUDE: 2.5 V
MDC IDC STAT BRADY AP VP PERCENT: 16 %
MDC IDC STAT BRADY AP VS PERCENT: 0 %
MDC IDC STAT BRADY AS VP PERCENT: 63 %

## 2016-04-05 MED ORDER — SODIUM CHLORIDE 0.9 % IV BOLUS (SEPSIS)
250.0000 mL | Freq: Once | INTRAVENOUS | Status: AC
  Administered 2016-04-05: 250 mL via INTRAVENOUS

## 2016-04-05 NOTE — Progress Notes (Signed)
OT Cancellation Note  Patient Details Name: [name] [name] MRN: [id number] DOB: May 05, 1936   Cancelled Treatment:    Reason Eval/Treat Not Completed: Medical issues which prohibited therapy -- pt with low BP; nurses attempting to replace IV to give pt bolus. Will check back at a later time.  Alexys Gassett A 04/05/2016, 10:09 AM

## 2016-04-05 NOTE — Progress Notes (Signed)
Subjective: 2 Days Post-Op Procedure(s) (LRB): RIGHT TOTAL KNEE ARTHROPLASTY (Right) Patient reports pain as moderate.   Patient seen in rounds with Dr. Wynelle Link. Patient is well, and has had no acute complaints or problems other than discomfort in the right knee. No SOB or chest pain. Voiding well. Positive flatus. Plan is to go Home after hospital stay.  Objective: Vital signs in last 24 hours: Temp:  [98.4 F (36.9 C)-99 F (37.2 C)] 99 F (37.2 C) (04/19 0410) Pulse Rate:  [72-94] 94 (04/19 0410) Resp:  [16-20] 20 (04/19 0410) BP: (103-138)/(48-99) 103/48 mmHg (04/19 0410) SpO2:  [95 %-100 %] 95 % (04/19 0410)  Intake/Output from previous day:  Intake/Output Summary (Last 24 hours) at 04/05/16 0739 Last data filed at 04/05/16 0420  Gross per 24 hour  Intake 688.75 ml  Output   1500 ml  Net -811.25 ml     Labs:  Recent Labs  04/04/16 0411 04/05/16 0408  HGB 10.8* 10.5*    Recent Labs  04/04/16 0411 04/05/16 0408  WBC 10.5 9.6  RBC 3.57* 3.49*  HCT 32.4* 32.9*  PLT 135* 130*    Recent Labs  04/04/16 0411 04/05/16 0408  NA 146* 141  K 4.6 3.8  CL 109 106  CO2 28 29  BUN 10 15  CREATININE 0.85 0.89  GLUCOSE 142* 104*  CALCIUM 9.2 8.5*    Recent Labs  04/03/16 0748  INR 0.96    EXAM General - Patient is Alert and Oriented Extremity - Neurologically intact Intact pulses distally Dorsiflexion/Plantar flexion intact No cellulitis present Compartment soft Dressing/Incision - clean, dry, no drainage Motor Function - intact, moving foot and toes well on exam.   Past Medical History  Diagnosis Date  . Allergic rhinitis   . Sarcoidosis ([place])   . Cardiac pacemaker in situ     DOI  08-04-2008  medtronic dual chamber  . Hyperlipidemia   . Shortness of breath     OCCASIONAL  . GERD (gastroesophageal reflux disease)   . History of pulmonary embolus (PE)   . Hypertension   . History of CVA (cerebrovascular accident)     09-15-2005  Left  Cerebellar Hematoma with mild stem compression whild on coumadin for DVT  . Venous insufficiency   . History of DVT of lower extremity   . Anxiety   . Renal insufficiency   . VIN III (vulvar intraepithelial neoplasia III)   . Coronary artery disease CARDIOLOGIST-- DR Cristopher Peru    per cath-- Non-obstructive minimal CAD  . Chronic combined systolic and diastolic CHF, NYHA class 2 ([place])     montior by cardiologist-- dr taylor--  ef 45%  . History of non-ST elevation myocardial infarction (NSTEMI)     2011-- in setting of bilateral PE's and DVT's and CHF  . PAF (paroxysmal atrial fibrillation) ([place])   . Mild pulmonary hypertension ([place])   . History of cervical dysplasia     CIN III  . Thrombophilia ([place])     hemotologist--  dr Marin Olp--  monitors pt anitcoagulation  . Chronic anticoagulation   . History of peptic ulcer disease   . OA (osteoarthritis)   . Moderate intermittent asthma   . LBBB (left bundle branch block)   . Urgency of urination   . Incontinence of urine   . Fluid retention   . Mild obstructive sleep apnea     PT USES Humidied Nasal Mask with O2 at2.5L (study 08-05-2009)    Assessment/Plan: 2 Days Post-Op Procedure(s) (  LRB): RIGHT TOTAL KNEE ARTHROPLASTY (Right) Principal Problem:   OA (osteoarthritis) of knee  Estimated body mass index is 32.6 kg/(m^2) as calculated from the following:   Height as of this encounter: 5\' 4"  (1.626 m).   Weight as of this encounter: 86.183 kg (190 lb). Advance diet Up with therapy Discharge home with virtual therapy  DVT Prophylaxis - Xarelto Weight-Bearing as tolerated   Doing well but hypotensive this morning. Will give bolus. DC home today.  Ardeen Jourdain, PA-C Orthopaedic Surgery 04/05/2016, 7:39 AM

## 2016-04-05 NOTE — Progress Notes (Signed)
Physical Therapy Treatment Patient Details Name: [name] [name] MRN: [id number] DOB: 01-15-1936 Today's Date: 04/05/2016    History of Present Illness r tka    PT Comments    POD # 2 pm session.  Assisted with amb a greater distance and practiced one step.  Slow shuffled gait with poor forward flex posture with tendency to push walker too far to front.  HIGH FALL RISK.  As long as daughter is able to provide 24/7 care/assist....the patient ready for D/C to home.  Follow Up Recommendations  Other (comment) (Virtual PT)  daughter plans to stay with pt full 2 weeks   Equipment Recommendations  None recommended by PT    Recommendations for Other Services       Precautions / Restrictions Precautions Precautions: Fall;Knee Precaution Comments: instructed on KI use for amb and instructed on proper application Required Braces or Orthoses: Knee Immobilizer - Right Knee Immobilizer - Right: Discontinue once straight leg raise with < 10 degree lag Restrictions Weight Bearing Restrictions: No Other Position/Activity Restrictions: WBAT    Mobility  Bed Mobility               General bed mobility comments: OOB in recliner  Transfers Overall transfer level: Needs assistance   Transfers: Sit to/from Stand           General transfer comment: 25% VC's on proper hand placement esp with stand to sit and 75% VC's to complete turns prior to sit as pt tends to reach far out for recliner.    Ambulation/Gait Ambulation/Gait assistance: Min assist Ambulation Distance (Feet): 35 Feet Assistive device: Rolling walker (2 wheeled) Gait Pattern/deviations: Step-to pattern;Trunk flexed     General Gait Details: 25% Vc's on upright posture.  Slow gait.     Stairs Stairs: Yes Stairs assistance: Min assist Stair Management: No rails;Step to pattern;Forwards;With walker Number of Stairs: 1 General stair comments: increased time and 50% VC's on proper sequencing esp since now it's  her opposite LE TKR  Wheelchair Mobility    Modified Rankin (Stroke Patients Only)       Balance                                    Cognition Arousal/Alertness: Awake/alert Behavior During Therapy: Impulsive Overall Cognitive Status: Impaired/Different from baseline                      Exercises      General Comments        Pertinent Vitals/Pain Pain Assessment: 0-10 Pain Score: 9  Pain Location: R knee Pain Descriptors / Indicators: Grimacing Pain Intervention(s): Monitored during session;Repositioned;Ice applied;Patient requesting pain meds-RN notified    Home Living                      Prior Function            PT Goals (current goals can now be found in the care plan section) Progress towards PT goals: Progressing toward goals    Frequency  7X/week    PT Plan Current plan remains appropriate    Co-evaluation             End of Session Equipment Utilized During Treatment: Gait belt Activity Tolerance: Patient tolerated treatment well Patient left: in chair;with call bell/phone within reach;with family/visitor present     Time: 1515-1540 PT Time Calculation (min) (ACUTE ONLY):  25 min  Charges:  $Gait Training: 8-22 mins $Therapeutic Activity: 8-22 mins                    G Codes:      Rica Koyanagi  PTA [facility]  Acute  Rehab Pager      [phone]

## 2016-04-05 NOTE — Progress Notes (Signed)
Physical Therapy Treatment Patient Details Name: [name] [name] MRN: [id number] DOB: 1936-10-22 Today's Date: 04/05/2016    History of Present Illness r tka    PT Comments    POD # 2 am session withheld till after IV Bolus.  Assisted OOB, orthostaice BP's taken see vitals section in EPIC.  No c/o dizziness.  Assisted to bathroom.  Very slow, shuffled gait with poor forward flex posture.  Extra VC's for safety with turns and proper walker to self distance as pt tends to push too far out front.  HIGH RISK for anterior LOB.  Will need another session before D/C to practice one step into home.   Follow Up Recommendations  Other (comment) (Virtual PT)  Daughter plans to stay with pt for a full 2 weeks   Equipment Recommendations  None recommended by PT  Has everything from prior TKR  Recommendations for Other Services       Precautions / Restrictions Precautions Precautions: Fall;Knee Precaution Comments: instructed on KI use for amb and instructed on proper application Required Braces or Orthoses: Knee Immobilizer - Right Knee Immobilizer - Right: Discontinue once straight leg raise with < 10 degree lag Restrictions Weight Bearing Restrictions: No Other Position/Activity Restrictions: WBAT    Mobility  Bed Mobility               General bed mobility comments: OOB in recliner  Transfers Overall transfer level: Needs assistance   Transfers: Sit to/from Stand           General transfer comment: 25% VC's on proper hand placement esp with stand to sit and 75% VC's to complete turns prior to sit as pt tends to reach far out for recliner.    Ambulation/Gait Ambulation/Gait assistance: Min assist Ambulation Distance (Feet): 12 Feet Assistive device: Rolling walker (2 wheeled) Gait Pattern/deviations: Step-to pattern;Trunk flexed     General Gait Details: 50% VC's to increase upright posture, near 85 degrees flex forward.  Difficulty advancing R LE and unsafe  shuffled steps.  75% VC's for proper walker to self distance as pt tends to push too far to front.     Stairs            Wheelchair Mobility    Modified Rankin (Stroke Patients Only)       Balance                                    Cognition Arousal/Alertness: Awake/alert Behavior During Therapy: Impulsive Overall Cognitive Status: Impaired/Different from baseline                      Exercises      General Comments        Pertinent Vitals/Pain Pain Assessment: 0-10 Pain Score: 4  Pain Location: R knee Pain Descriptors / Indicators: Constant;Sharp;Discomfort;Grimacing Pain Intervention(s): Repositioned;Ice applied;Monitored during session    Home Living                      Prior Function            PT Goals (current goals can now be found in the care plan section) Progress towards PT goals: Progressing toward goals    Frequency  7X/week    PT Plan Current plan remains appropriate    Co-evaluation             End of Session Equipment Utilized  During Treatment: Gait belt Activity Tolerance: Patient tolerated treatment well Patient left: in chair;with call bell/phone within reach;with family/visitor present     Time: 1345-1415 PT Time Calculation (min) (ACUTE ONLY): 30 min  Charges:  $Gait Training: 8-22 mins $Therapeutic Activity: 8-22 mins                    G Codes:      Rica Koyanagi  PTA [facility]  Acute  Rehab Pager      [phone]

## 2016-04-06 NOTE — Discharge Summary (Signed)
Physician Discharge Summary   Patient ID: [name] [name] MRN: [id number] DOB/AGE: [id number] 78 y.o.  Admit date: 04/03/2016 Discharge date: 04/05/2016  Primary Diagnosis: Primary osteoarthritis right knee   Admission Diagnoses:  Past Medical History  Diagnosis Date  . Allergic rhinitis   . Sarcoidosis ([place])   . Cardiac pacemaker in situ     DOI  08-04-2008  medtronic dual chamber  . Hyperlipidemia   . Shortness of breath     OCCASIONAL  . GERD (gastroesophageal reflux disease)   . History of pulmonary embolus (PE)   . Hypertension   . History of CVA (cerebrovascular accident)     09-15-2005  Left Cerebellar Hematoma with mild stem compression whild on coumadin for DVT  . Venous insufficiency   . History of DVT of lower extremity   . Anxiety   . Renal insufficiency   . VIN III (vulvar intraepithelial neoplasia III)   . Coronary artery disease CARDIOLOGIST-- DR Cristopher Peru    per cath-- Non-obstructive minimal CAD  . Chronic combined systolic and diastolic CHF, NYHA class 2 ([place])     montior by cardiologist-- dr taylor--  ef 45%  . History of non-ST elevation myocardial infarction (NSTEMI)     2011-- in setting of bilateral PE's and DVT's and CHF  . PAF (paroxysmal atrial fibrillation) ([place])   . Mild pulmonary hypertension ([place])   . History of cervical dysplasia     CIN III  . Thrombophilia ([place])     hemotologist--  dr Marin Olp--  monitors pt anitcoagulation  . Chronic anticoagulation   . History of peptic ulcer disease   . OA (osteoarthritis)   . Moderate intermittent asthma   . LBBB (left bundle branch block)   . Urgency of urination   . Incontinence of urine   . Fluid retention   . Mild obstructive sleep apnea     PT USES Humidied Nasal Mask with O2 at2.5L (study 08-05-2009)   Discharge Diagnoses:   Principal Problem:   OA (osteoarthritis) of knee  Estimated body mass index is 32.6 kg/(m^2) as calculated from the following:   Height as of this encounter:  _0  (1.626 m).   Weight as of this encounter: 86.183 kg (190 lb).  Procedure:  Procedure(s) (LRB): RIGHT TOTAL KNEE ARTHROPLASTY (Right)   Consults: None  HPI: The patient is a 80 year old female who comes in for a preoperative History and Physical. The patient is scheduled for a right total knee arthroplasty to be performed by Dr. Dione Plover. Aluisio, MD at [facility] on 04/03/2016. [name] [name] comes in today with a chief complaint of right knee pain. She reports she has had increased right knee pain for the last two months. She states most of her pain is along the lateral aspect and in the popliteal space. It is very stiff in the morning when she first gets up. When she starts to walk she feels some relief. No previous injections in the right knee. No previous surgery. No injury and no falls. She denies groin pain. No numbness or tingling in her legs. She has a history of a left total knee replacement almost two years ago. She has done very well with that. She said that this is one of the best things she has ever done. She does walk daily, she walks around the house and in the yard. The right knee is bone on bone in the lateral and patellofemoral compartments with a valgus deformity noted. She is nearly  two years out from her left total knee. She has done very well with that. She should continue her active lifestyle and the right knee is holding her back. She has done well with the left knee and now is ready to proceed with the right knee.They have been treated conservatively in the past for the above stated problem and despite conservative measures, they continue to have progressive pain and severe functional limitations and dysfunction. They have failed non-operative management including home exercise, medications, and injections. It is felt that they would benefit from undergoing total joint replacement. Risks and benefits of the procedure have been discussed with the patient and they elect  to proceed with surgery. There are no active contraindications to surgery such as ongoing infection or rapidly progressive neurological disease.  Laboratory Data: Admission on 04/03/2016, Discharged on 04/05/2016  Component Date Value Ref Range Status  . Prothrombin Time 04/03/2016 13.0  11.6 - 15.2 seconds Final  . INR 04/03/2016 0.96  0.00 - 1.49 Final  . WBC 04/04/2016 10.5  4.0 - 10.5 K/uL Final  . RBC 04/04/2016 3.57* 3.87 - 5.11 MIL/uL Final  . Hemoglobin 04/04/2016 10.8* 12.0 - 15.0 g/dL Final  . HCT 04/04/2016 32.4* 36.0 - 46.0 % Final  . MCV 04/04/2016 90.8  78.0 - 100.0 fL Final  . MCH 04/04/2016 30.3  26.0 - 34.0 pg Final  . MCHC 04/04/2016 33.3  30.0 - 36.0 g/dL Final  . RDW 04/04/2016 13.4  11.5 - 15.5 % Final  . Platelets 04/04/2016 135* 150 - 400 K/uL Final  . Sodium 04/04/2016 146* 135 - 145 mmol/L Final  . Potassium 04/04/2016 4.6  3.5 - 5.1 mmol/L Final  . Chloride 04/04/2016 109  101 - 111 mmol/L Final  . CO2 04/04/2016 28  22 - 32 mmol/L Final  . Glucose, Bld 04/04/2016 142* 65 - 99 mg/dL Final  . BUN 04/04/2016 10  6 - 20 mg/dL Final  . Creatinine, Ser 04/04/2016 0.85  0.44 - 1.00 mg/dL Final  . Calcium 04/04/2016 9.2  8.9 - 10.3 mg/dL Final  . GFR calc non Af Amer 04/04/2016 >60  >60 mL/min Final  . GFR calc Af Amer 04/04/2016 >60  >60 mL/min Final   Comment: (NOTE) The eGFR has been calculated using the CKD EPI equation. This calculation has not been validated in all clinical situations. eGFR's persistently <60 mL/min signify possible Chronic Kidney Disease.   . Anion gap 04/04/2016 9  5 - 15 Final  . WBC 04/05/2016 9.6  4.0 - 10.5 K/uL Final  . RBC 04/05/2016 3.49* 3.87 - 5.11 MIL/uL Final  . Hemoglobin 04/05/2016 10.5* 12.0 - 15.0 g/dL Final  . HCT 04/05/2016 32.9* 36.0 - 46.0 % Final  . MCV 04/05/2016 94.3  78.0 - 100.0 fL Final  . MCH 04/05/2016 30.1  26.0 - 34.0 pg Final  . MCHC 04/05/2016 31.9  30.0 - 36.0 g/dL Final  . RDW 04/05/2016 14.0  11.5 -  15.5 % Final  . Platelets 04/05/2016 130* 150 - 400 K/uL Final  . Sodium 04/05/2016 141  135 - 145 mmol/L Final  . Potassium 04/05/2016 3.8  3.5 - 5.1 mmol/L Final   Comment: RESULT REPEATED AND VERIFIED DELTA CHECK NOTED   . Chloride 04/05/2016 106  101 - 111 mmol/L Final  . CO2 04/05/2016 29  22 - 32 mmol/L Final  . Glucose, Bld 04/05/2016 104* 65 - 99 mg/dL Final  . BUN 04/05/2016 15  6 - 20 mg/dL Final  .  Creatinine, Ser 04/05/2016 0.89  0.44 - 1.00 mg/dL Final  . Calcium 04/05/2016 8.5* 8.9 - 10.3 mg/dL Final  . GFR calc non Af Amer 04/05/2016 >60  >60 mL/min Final  . GFR calc Af Amer 04/05/2016 >60  >60 mL/min Final   Comment: (NOTE) The eGFR has been calculated using the CKD EPI equation. This calculation has not been validated in all clinical situations. eGFR's persistently <60 mL/min signify possible Chronic Kidney Disease.   Georgiann Hahn gap 04/05/2016 6  5 - 15 Final  Hospital Outpatient Visit on 03/24/2016  Component Date Value Ref Range Status  . aPTT 03/24/2016 41* 24 - 37 seconds Final   Comment:        IF BASELINE aPTT IS ELEVATED, SUGGEST PATIENT RISK ASSESSMENT BE USED TO DETERMINE APPROPRIATE ANTICOAGULANT THERAPY.   . WBC 03/24/2016 6.7  4.0 - 10.5 K/uL Final  . RBC 03/24/2016 4.19  3.87 - 5.11 MIL/uL Final  . Hemoglobin 03/24/2016 12.6  12.0 - 15.0 g/dL Final  . HCT 03/24/2016 39.1  36.0 - 46.0 % Final  . MCV 03/24/2016 93.3  78.0 - 100.0 fL Final  . MCH 03/24/2016 30.1  26.0 - 34.0 pg Final  . MCHC 03/24/2016 32.2  30.0 - 36.0 g/dL Final  . RDW 03/24/2016 13.9  11.5 - 15.5 % Final  . Platelets 03/24/2016 186  150 - 400 K/uL Final  . Sodium 03/24/2016 144  135 - 145 mmol/L Final  . Potassium 03/24/2016 4.3  3.5 - 5.1 mmol/L Final  . Chloride 03/24/2016 106  101 - 111 mmol/L Final  . CO2 03/24/2016 29  22 - 32 mmol/L Final  . Glucose, Bld 03/24/2016 88  65 - 99 mg/dL Final  . BUN 03/24/2016 19  6 - 20 mg/dL Final  . Creatinine, Ser 03/24/2016 0.98  0.44 -  1.00 mg/dL Final  . Calcium 03/24/2016 9.4  8.9 - 10.3 mg/dL Final  . Total Protein 03/24/2016 7.9  6.5 - 8.1 g/dL Final  . Albumin 03/24/2016 4.4  3.5 - 5.0 g/dL Final  . AST 03/24/2016 29  15 - 41 U/L Final  . ALT 03/24/2016 15  14 - 54 U/L Final  . Alkaline Phosphatase 03/24/2016 52  38 - 126 U/L Final  . Total Bilirubin 03/24/2016 0.8  0.3 - 1.2 mg/dL Final  . GFR calc non Af Amer 03/24/2016 54* >60 mL/min Final  . GFR calc Af Amer 03/24/2016 >60  >60 mL/min Final   Comment: (NOTE) The eGFR has been calculated using the CKD EPI equation. This calculation has not been validated in all clinical situations. eGFR's persistently <60 mL/min signify possible Chronic Kidney Disease.   . Anion gap 03/24/2016 9  5 - 15 Final  . Prothrombin Time 03/24/2016 23.8* 11.6 - 15.2 seconds Final  . INR 03/24/2016 2.24* 0.00 - 1.49 Final  . ABO/RH(D) 03/24/2016 A POS   Final  . Antibody Screen 03/24/2016 NEG   Final  . Sample Expiration 03/24/2016 04/06/2016   Final  . Extend sample reason 03/24/2016 NO TRANSFUSIONS OR PREGNANCY IN THE PAST 3 MONTHS   Final  . Color, Urine 03/24/2016 YELLOW  YELLOW Final  . APPearance 03/24/2016 CLEAR  CLEAR Final  . Specific Gravity, Urine 03/24/2016 1.010  1.005 - 1.030 Final  . pH 03/24/2016 6.0  5.0 - 8.0 Final  . Glucose, UA 03/24/2016 NEGATIVE  NEGATIVE mg/dL Final  . Hgb urine dipstick 03/24/2016 NEGATIVE  NEGATIVE Final  . Bilirubin Urine 03/24/2016 NEGATIVE  NEGATIVE  Final  . Ketones, ur 03/24/2016 NEGATIVE  NEGATIVE mg/dL Final  . Protein, ur 03/24/2016 NEGATIVE  NEGATIVE mg/dL Final  . Nitrite 03/24/2016 POSITIVE* NEGATIVE Final  . Leukocytes, UA 03/24/2016 NEGATIVE  NEGATIVE Final  . MRSA, PCR 03/24/2016 NEGATIVE  NEGATIVE Final  . Staphylococcus aureus 03/24/2016 NEGATIVE  NEGATIVE Final   Comment:        The Xpert SA Assay (FDA approved for NASAL specimens in patients over 28 years of age), is one component of a comprehensive  surveillance program.  Test performance has been validated by [facility] for patients greater than or equal to 1 year old. It is not intended to diagnose infection nor to guide or monitor treatment.   . Squamous Epithelial / LPF 03/24/2016 0-5* NONE SEEN Final  . WBC, UA 03/24/2016 0-5  0 - 5 WBC/hpf Final  . RBC / HPF 03/24/2016 0-5  0 - 5 RBC/hpf Final  . Bacteria, UA 03/24/2016 MANY* NONE SEEN Final  Clinical Support on 03/07/2016  Component Date Value Ref Range Status  . Date Time Interrogation Session 03/07/2016 [id number]   Final  . Pulse Generator Manufacturer 03/07/2016 MERM   Final  . Pulse Gen Model 03/07/2016 [id number] Sherril Croon   Final  . Pulse Gen Serial Number 03/07/2016 [id number]   Final  . Implantable Pulse Generator Type 03/07/2016 Implantable Pulse Generator   Final  . Implantable Pulse Generator Implan* 03/07/2016 [id number]   Final  . Implantable Lead Manufacturer 03/07/2016 MERM   Final  . Implantable Lead Model 03/07/2016 5076 CapSureFix Novus   Final  . Implantable Lead Serial Number 03/07/2016 [id number]   Final  . Implantable Lead Implant Date 03/07/2016 [id number]   Final  . Implantable Lead Location 03/07/2016 [id number]   Final  . Implantable Lead Manufacturer 03/07/2016 MERM   Final  . Implantable Lead Model 03/07/2016 5076 CapSureFix Novus   Final  . Implantable Lead Serial Number 03/07/2016 [id number]   Final  . Implantable Lead Implant Date 03/07/2016 [id number]   Final  . Implantable Lead Location 03/07/2016 [id number]   Final  . Lead Channel Setting Sensing Sensi* 03/07/2016 4.00   Final  . Lead Channel Setting Pacing Amplit* 03/07/2016 2.000   Final  . Lead Channel Setting Pacing Pulse * 03/07/2016 0.40   Final  . Lead Channel Setting Pacing Amplit* 03/07/2016 2.500   Final  . Lead Channel Impedance Value 03/07/2016 386   Final  . Lead Channel Sensing Intrinsic Amp* 03/07/2016 2.8   Final  . Lead Channel Pacing Threshold Ampl* 03/07/2016 0.625    Final  . Lead Channel Pacing Threshold Puls* 03/07/2016 0.4   Final  . Lead Channel Impedance Value 03/07/2016 524   Final  . Lead Channel Sensing Intrinsic Amp* 03/07/2016 8   Final  . Lead Channel Pacing Threshold Ampl* 03/07/2016 1.125   Final  . Lead Channel Pacing Threshold Puls* 03/07/2016 0.4   Final  . Battery Status 03/07/2016 Unknown   Final  . Battery Remaining Longevity 03/07/2016 18   Final  . Battery Voltage 03/07/2016 2.72   Final  . Battery Impedance 03/07/2016 3052   Final  . Brady Statistic AP VP Percent 03/07/2016 16   Final  . Loletha Grayer Statistic AS VP Percent 03/07/2016 63   Final  . Loletha Grayer Statistic AP VS Percent 03/07/2016 0   Final  . Loletha Grayer Statistic AS VS Percent 03/07/2016 21   Final  . Eval Rhythm 03/07/2016 ApVp   Final     X-Rays:No results found.  EKG: Orders placed or performed in visit on 03/24/16  . EKG 12-Lead     Hospital Course: JODIANN OGNIBENE is a 80 y.o. who was admitted to [facility]. They were brought to the operating room on 04/03/2016 and underwent Procedure(s): RIGHT TOTAL KNEE ARTHROPLASTY.  Patient tolerated the procedure well and was later transferred to the recovery room and then to the orthopaedic floor for postoperative care.  They were given PO and IV analgesics for pain control following their surgery.  They were given 24 hours of postoperative antibiotics of  Anti-infectives    Start     Dose/Rate Route Frequency Ordered Stop   04/03/16 1600  ceFAZolin (ANCEF) IVPB 2g/100 mL premix     2 g 200 mL/hr over 30 Minutes Intravenous Every 6 hours 04/03/16 1244 04/03/16 2239   04/03/16 0731  ceFAZolin (ANCEF) IVPB 2g/100 mL premix     2 g 200 mL/hr over 30 Minutes Intravenous On call to O.R. 04/03/16 7026 04/03/16 1010     and started on DVT prophylaxis in the form of Xarelto.   PT and OT were ordered for total joint protocol.  Discharge planning consulted to help with postop disposition and equipment needs.  Patient had a fair  night on the evening of surgery.  They started to get up OOB with therapy on day one. Hemovac drain was pulled without difficulty.  Continued to work with therapy into day two.  Dressing was changed on day two and the incision was clean and dry.  Incision was healing well.  Patient was seen in rounds and was ready to go home once she responded to fluid supplementation and her blood pressure improved.    Diet: Cardiac diet Activity:WBAT Follow-up:in 2 weeks Disposition - Home Discharged Condition: stable   Discharge Instructions    Call MD / Call 911    Complete by:  As directed   If you experience chest pain or shortness of breath, CALL 911 and be transported to the hospital emergency room.  If you develope a fever above 101 F, pus (white drainage) or increased drainage or redness at the wound, or calf pain, call your surgeon's office.     Constipation Prevention    Complete by:  As directed   Drink plenty of fluids.  Prune juice may be helpful.  You may use a stool softener, such as Colace (over the counter) 100 mg twice a day.  Use MiraLax (over the counter) for constipation as needed.     Diet - low sodium heart healthy    Complete by:  As directed      Discharge instructions    Complete by:  As directed   Dr. Gaynelle Arabian Total Joint Specialist [facility] [address], [place],  [address] [phone]  TOTAL KNEE REPLACEMENT POSTOPERATIVE DIRECTIONS  Knee Rehabilitation, Guidelines Following Surgery  Results after knee surgery are often greatly improved when you follow the exercise, range of motion and muscle strengthening exercises prescribed by your doctor. Safety measures are also important to protect the knee from further injury. Any time any of these exercises cause you to have increased pain or swelling in your knee joint, decrease the amount until you are comfortable again and slowly increase them. If you have problems or questions, call your  caregiver or physical therapist for advice.   HOME CARE INSTRUCTIONS  Remove items at home which could result in a fall. This includes throw rugs or furniture in walking pathways.  ICE to the affected knee every three hours for 30 minutes at a time and then as needed for pain and swelling.  Continue to use ice on the knee for pain and swelling from surgery. You may notice swelling that will progress down to the foot and ankle.  This is normal after surgery.  Elevate the leg when you are not up walking on it.   Continue to use the breathing machine which will help keep your temperature down.  It is common for your temperature to cycle up and down following surgery, especially at night when you are not up moving around and exerting yourself.  The breathing machine keeps your lungs expanded and your temperature down. Do not place pillow under knee, focus on keeping the knee straight while resting  DIET You may resume your previous home diet once your are discharged from the hospital.  DRESSING / WOUND CARE / SHOWERING You may start showering once you are discharged home but do not submerge the incision under water. Just pat the incision dry and apply a dry gauze dressing on daily. Change the surgical dressing daily and reapply a dry dressing each time.  ACTIVITY Walk with your walker as instructed. Use walker as long as suggested by your caregivers. Avoid periods of inactivity such as sitting longer than an hour when not asleep. This helps prevent blood clots.  You may resume a sexual relationship in one month or when given the OK by your doctor.  You may return to work once you are cleared by your doctor.  Do not drive a car for 6 weeks or until released by you surgeon.  Do not drive while taking narcotics.  WEIGHT BEARING Weight bearing as tolerated with assist device (walker, cane, etc) as directed, use it as long as suggested by your surgeon or therapist, typically at least 4-6  weeks.  POSTOPERATIVE CONSTIPATION PROTOCOL Constipation - defined medically as fewer than three stools per week and severe constipation as less than one stool per week.  One of the most common issues patients have following surgery is constipation.  Even if you have a regular bowel pattern at home, your normal regimen is likely to be disrupted due to multiple reasons following surgery.  Combination of anesthesia, postoperative narcotics, change in appetite and fluid intake all can affect your bowels.  In order to avoid complications following surgery, here are some recommendations in order to help you during your recovery period.  Colace (docusate) - Pick up an over-the-counter form of Colace or another stool softener and take twice a day as long as you are requiring postoperative pain medications.  Take with a full glass of water daily.  If you experience loose stools or diarrhea, hold the colace until you stool forms back up.  If your symptoms do not get better within 1 week or if they get worse, check with your doctor.  Dulcolax (bisacodyl) - Pick up over-the-counter and take as directed by the product packaging as needed to assist with the movement of your bowels.  Take with a full glass of water.  Use this product as needed if not relieved by Colace only.   MiraLax (polyethylene glycol) - Pick up over-the-counter to have on hand.  MiraLax is a solution that will increase the amount of water in your bowels to assist with bowel movements.  Take as directed and can mix with a glass of water, juice, soda, coffee, or tea.  Take if you go more than two  days without a movement. Do not use MiraLax more than once per day. Call your doctor if you are still constipated or irregular after using this medication for 7 days in a row.  If you continue to have problems with postoperative constipation, please contact the office for further assistance and recommendations.  If you experience "the worst abdominal  pain ever" or develop nausea or vomiting, please contact the office immediatly for further recommendations for treatment.  ITCHING  If you experience itching with your medications, try taking only a single pain pill, or even half a pain pill at a time.  You can also use Benadryl over the counter for itching or also to help with sleep.   TED HOSE STOCKINGS Wear the elastic stockings on both legs for three weeks following surgery during the day but you may remove then at night for sleeping.  MEDICATIONS See your medication summary on the "After Visit Summary" that the nursing staff will review with you prior to discharge.  You may have some home medications which will be placed on hold until you complete the course of blood thinner medication.  It is important for you to complete the blood thinner medication as prescribed by your surgeon.  Continue your approved medications as instructed at time of discharge.  PRECAUTIONS If you experience chest pain or shortness of breath - call 911 immediately for transfer to the hospital emergency department.  If you develop a fever greater that 101 F, purulent drainage from wound, increased redness or drainage from wound, foul odor from the wound/dressing, or calf pain - CONTACT YOUR SURGEON.                                                   FOLLOW-UP APPOINTMENTS Make sure you keep all of your appointments after your operation with your surgeon and caregivers. You should call the office at the above phone number and make an appointment for approximately two weeks after the date of your surgery or on the date instructed by your surgeon outlined in the "After Visit Summary".   RANGE OF MOTION AND STRENGTHENING EXERCISES  Rehabilitation of the knee is important following a knee injury or an operation. After just a few days of immobilization, the muscles of the thigh which control the knee become weakened and shrink (atrophy). Knee exercises are designed to build  up the tone and strength of the thigh muscles and to improve knee motion. Often times heat used for twenty to thirty minutes before working out will loosen up your tissues and help with improving the range of motion but do not use heat for the first two weeks following surgery. These exercises can be done on a training (exercise) mat, on the floor, on a table or on a bed. Use what ever works the best and is most comfortable for you Knee exercises include:  Leg Lifts - While your knee is still immobilized in a splint or cast, you can do straight leg raises. Lift the leg to 60 degrees, hold for 3 sec, and slowly lower the leg. Repeat 10-20 times 2-3 times daily. Perform this exercise against resistance later as your knee gets better.  Quad and Hamstring Sets - Tighten up the muscle on the front of the thigh (Quad) and hold for 5-10 sec. Repeat this 10-20 times hourly. Hamstring sets are  done by pushing the foot backward against an object and holding for 5-10 sec. Repeat as with quad sets.  Leg Slides: Lying on your back, slowly slide your foot toward your buttocks, bending your knee up off the floor (only go as far as is comfortable). Then slowly slide your foot back down until your leg is flat on the floor again. Angel Wings: Lying on your back spread your legs to the side as far apart as you can without causing discomfort.  A rehabilitation program following serious knee injuries can speed recovery and prevent re-injury in the future due to weakened muscles. Contact your doctor or a physical therapist for more information on knee rehabilitation.   IF YOU ARE TRANSFERRED TO A SKILLED REHAB FACILITY If the patient is transferred to a skilled rehab facility following release from the hospital, a list of the current medications will be sent to the facility for the patient to continue.  When discharged from the skilled rehab facility, please have the facility set up the patient's [place]  prior to being released. Also, the skilled facility will be responsible for providing the patient with their medications at time of release from the facility to include their pain medication, the muscle relaxants, and their blood thinner medication. If the patient is still at the rehab facility at time of the two week follow up appointment, the skilled rehab facility will also need to assist the patient in arranging follow up appointment in our office and any transportation needs.  MAKE SURE YOU:  Understand these instructions.  Get help right away if you are not doing well or get worse.    Pick up stool softner and laxative for home use following surgery while on pain medications. Do not submerge incision under water. Please use good hand washing techniques while changing dressing each day. May shower starting three days after surgery. Please use a clean towel to pat the incision dry following showers. Continue to use ice for pain and swelling after surgery. Do not use any lotions or creams on the incision until instructed by your surgeon.     Increase activity slowly as tolerated    Complete by:  As directed             Medication List    STOP taking these medications        acetaminophen 325 MG tablet  Commonly known as:  TYLENOL     benzonatate 100 MG capsule  Commonly known as:  TESSALON     ranitidine 300 MG tablet  Commonly known as:  ZANTAC     rifaximin 550 MG Tabs tablet  Commonly known as:  XIFAXAN      TAKE these medications        Azelastine HCl 0.15 % Soln  Place 2 sprays into the nose 2 (two) times daily.     cetirizine 10 MG tablet  Commonly known as:  ZYRTEC  Take 10 mg by mouth every morning.     cholecalciferol 1000 units tablet  Commonly known as:  VITAMIN D  Take 1,000 Units by mouth every morning.     clorazepate 7.5 MG tablet  Commonly known as:  TRANXENE  Take 1 tablet (7.5 mg total) by mouth 3 (three) times daily.     fluticasone 44  MCG/ACT inhaler  Commonly known as:  FLOVENT HFA  Inhale 2 puffs into the lungs 2 (two) times daily.     fluticasone 50 MCG/ACT nasal spray  Commonly known as:  FLONASE  1-2 puffs each nostril once daily     folic acid 329 MCG tablet  Commonly known as:  FOLVITE  Take 400 mcg by mouth every morning.     furosemide 40 MG tablet  Commonly known as:  LASIX  Take 1 tablet by mouth  daily     ipratropium-albuterol 0.5-2.5 (3) MG/3ML Soln  Commonly known as:  DUONEB  Take 3 mLs by nebulization 2 (two) times daily.     magic mouthwash Soln  Swish and swallow 5 ml 4 times daily if needed     methocarbamol 500 MG tablet  Commonly known as:  ROBAXIN  Take 1 tablet (500 mg total) by mouth every 6 (six) hours as needed for muscle spasms.     metoprolol succinate 25 MG 24 hr tablet  Commonly known as:  TOPROL-XL  Take 1 tablet by mouth  daily before breakfast     multivitamin tablet  Take 1 tablet by mouth every morning.     oxyCODONE 5 MG immediate release tablet  Commonly known as:  Oxy IR/ROXICODONE  Take 1-2 tablets (5-10 mg total) by mouth every 3 (three) hours as needed for breakthrough pain.     pantoprazole 40 MG tablet  Commonly known as:  PROTONIX  Take 1 tablet (40 mg total) by mouth 2 (two) times daily.     pravastatin 40 MG tablet  Commonly known as:  PRAVACHOL  Take 1 tablet by mouth at  bedtime     PROTEGRA PO  Take 1 tablet by mouth every morning.     sennosides-docusate sodium 8.6-50 MG tablet  Commonly known as:  SENOKOT-S  Take 2 tablets by mouth at bedtime.     SYSTANE 0.4-0.3 % Soln  Generic drug:  Polyethyl Glycol-Propyl Glycol  Place 2 drops into both eyes 4 (four) times daily.     traMADol 50 MG tablet  Commonly known as:  ULTRAM  Take 1-2 tablets (50-100 mg total) by mouth every 6 (six) hours as needed for moderate pain.     XARELTO 20 MG Tabs tablet  Generic drug:  rivaroxaban  Take 1 tablet by mouth  daily           Follow-up  Information    Follow up with Gearlean Alf, MD. Schedule an appointment as soon as possible for a visit on 04/18/2016.   Specialty:  Orthopedic Surgery   Why:  Call [phone] tomorrow to make the appointment   Contact information:   [address] [place] [address] [phone]       Signed: Ardeen Jourdain, PA-C Orthopaedic Surgery 04/06/2016, 1:56 PM

## 2016-04-07 ENCOUNTER — Encounter: Payer: Self-pay | Admitting: Cardiology

## 2016-04-07 ENCOUNTER — Telehealth: Payer: Self-pay | Admitting: *Deleted

## 2016-04-07 NOTE — Telephone Encounter (Signed)
Pt was on TCM list admitted for Osteoarthritis (R) knee had (R) knee replacement, and d/c 04/06/16. Will be f/u w/Dr. Wynelle Link in 2 weks...[name] [name]

## 2016-04-09 ENCOUNTER — Encounter (HOSPITAL_BASED_OUTPATIENT_CLINIC_OR_DEPARTMENT_OTHER): Payer: Self-pay | Admitting: Radiology

## 2016-04-09 ENCOUNTER — Emergency Department (HOSPITAL_BASED_OUTPATIENT_CLINIC_OR_DEPARTMENT_OTHER): Payer: Medicare Other

## 2016-04-09 ENCOUNTER — Emergency Department (HOSPITAL_BASED_OUTPATIENT_CLINIC_OR_DEPARTMENT_OTHER)
Admission: EM | Admit: 2016-04-09 | Discharge: 2016-04-09 | Disposition: A | Discharge status: Home / Self Care | Payer: Medicare Other | Attending: Emergency Medicine | Admitting: Emergency Medicine

## 2016-04-09 DIAGNOSIS — I10 Essential (primary) hypertension: Secondary | ICD-10-CM | POA: Diagnosis not present

## 2016-04-09 DIAGNOSIS — I252 Old myocardial infarction: Secondary | ICD-10-CM | POA: Insufficient documentation

## 2016-04-09 DIAGNOSIS — R0602 Shortness of breath: Secondary | ICD-10-CM | POA: Insufficient documentation

## 2016-04-09 DIAGNOSIS — Z9889 Other specified postprocedural states: Secondary | ICD-10-CM | POA: Diagnosis not present

## 2016-04-09 DIAGNOSIS — Z86711 Personal history of pulmonary embolism: Secondary | ICD-10-CM | POA: Diagnosis not present

## 2016-04-09 DIAGNOSIS — K219 Gastro-esophageal reflux disease without esophagitis: Secondary | ICD-10-CM | POA: Insufficient documentation

## 2016-04-09 DIAGNOSIS — Z79899 Other long term (current) drug therapy: Secondary | ICD-10-CM | POA: Diagnosis not present

## 2016-04-09 DIAGNOSIS — Z86718 Personal history of other venous thrombosis and embolism: Secondary | ICD-10-CM | POA: Diagnosis not present

## 2016-04-09 DIAGNOSIS — M1990 Unspecified osteoarthritis, unspecified site: Secondary | ICD-10-CM | POA: Insufficient documentation

## 2016-04-09 DIAGNOSIS — Z8669 Personal history of other diseases of the nervous system and sense organs: Secondary | ICD-10-CM | POA: Insufficient documentation

## 2016-04-09 DIAGNOSIS — J45901 Unspecified asthma with (acute) exacerbation: Secondary | ICD-10-CM | POA: Diagnosis not present

## 2016-04-09 DIAGNOSIS — Z87448 Personal history of other diseases of urinary system: Secondary | ICD-10-CM | POA: Insufficient documentation

## 2016-04-09 DIAGNOSIS — I2510 Atherosclerotic heart disease of native coronary artery without angina pectoris: Secondary | ICD-10-CM | POA: Diagnosis not present

## 2016-04-09 DIAGNOSIS — Z8673 Personal history of transient ischemic attack (TIA), and cerebral infarction without residual deficits: Secondary | ICD-10-CM | POA: Diagnosis not present

## 2016-04-09 DIAGNOSIS — I5042 Chronic combined systolic (congestive) and diastolic (congestive) heart failure: Secondary | ICD-10-CM | POA: Insufficient documentation

## 2016-04-09 DIAGNOSIS — Z862 Personal history of diseases of the blood and blood-forming organs and certain disorders involving the immune mechanism: Secondary | ICD-10-CM | POA: Diagnosis not present

## 2016-04-09 DIAGNOSIS — F419 Anxiety disorder, unspecified: Secondary | ICD-10-CM | POA: Diagnosis not present

## 2016-04-09 DIAGNOSIS — R5082 Postprocedural fever: Secondary | ICD-10-CM | POA: Diagnosis not present

## 2016-04-09 DIAGNOSIS — Z8711 Personal history of peptic ulcer disease: Secondary | ICD-10-CM | POA: Insufficient documentation

## 2016-04-09 DIAGNOSIS — Z7901 Long term (current) use of anticoagulants: Secondary | ICD-10-CM | POA: Insufficient documentation

## 2016-04-09 DIAGNOSIS — R059 Cough, unspecified: Secondary | ICD-10-CM

## 2016-04-09 DIAGNOSIS — R600 Localized edema: Secondary | ICD-10-CM | POA: Diagnosis not present

## 2016-04-09 DIAGNOSIS — E785 Hyperlipidemia, unspecified: Secondary | ICD-10-CM | POA: Diagnosis not present

## 2016-04-09 DIAGNOSIS — R05 Cough: Secondary | ICD-10-CM | POA: Insufficient documentation

## 2016-04-09 LAB — CBC WITH DIFFERENTIAL/PLATELET
BASOS ABS: 0 10*3/uL (ref 0.0–0.1)
Basophils Relative: 0 %
Eosinophils Absolute: 0.2 10*3/uL (ref 0.0–0.7)
Eosinophils Relative: 2 %
HEMATOCRIT: 33.1 % — AB (ref 36.0–46.0)
Hemoglobin: 10.7 g/dL — ABNORMAL LOW (ref 12.0–15.0)
LYMPHS ABS: 0.8 10*3/uL (ref 0.7–4.0)
LYMPHS PCT: 9 %
MCH: 30.4 pg (ref 26.0–34.0)
MCHC: 32.3 g/dL (ref 30.0–36.0)
MCV: 94 fL (ref 78.0–100.0)
Monocytes Absolute: 1 10*3/uL (ref 0.1–1.0)
Monocytes Relative: 12 %
NEUTROS ABS: 6.3 10*3/uL (ref 1.7–7.7)
Neutrophils Relative %: 77 %
Platelets: 209 10*3/uL (ref 150–400)
RBC: 3.52 MIL/uL — AB (ref 3.87–5.11)
RDW: 13.4 % (ref 11.5–15.5)
WBC: 8.3 10*3/uL (ref 4.0–10.5)

## 2016-04-09 LAB — I-STAT CG4 LACTIC ACID, ED: Lactic Acid, Venous: 1.5 mmol/L (ref 0.5–2.0)

## 2016-04-09 LAB — URINALYSIS, ROUTINE W REFLEX MICROSCOPIC
Bilirubin Urine: NEGATIVE
Glucose, UA: NEGATIVE mg/dL
Hgb urine dipstick: NEGATIVE
KETONES UR: NEGATIVE mg/dL
Nitrite: POSITIVE — AB
PH: 6.5 (ref 5.0–8.0)
Protein, ur: NEGATIVE mg/dL
SPECIFIC GRAVITY, URINE: 1.009 (ref 1.005–1.030)

## 2016-04-09 LAB — COMPREHENSIVE METABOLIC PANEL
ALBUMIN: 3.3 g/dL — AB (ref 3.5–5.0)
ALT: 28 U/L (ref 14–54)
ANION GAP: 13 (ref 5–15)
AST: 46 U/L — AB (ref 15–41)
Alkaline Phosphatase: 63 U/L (ref 38–126)
BILIRUBIN TOTAL: 2 mg/dL — AB (ref 0.3–1.2)
BUN: 14 mg/dL (ref 6–20)
CHLORIDE: 96 mmol/L — AB (ref 101–111)
CO2: 27 mmol/L (ref 22–32)
Calcium: 8.2 mg/dL — ABNORMAL LOW (ref 8.9–10.3)
Creatinine, Ser: 0.87 mg/dL (ref 0.44–1.00)
GFR calc Af Amer: >60 mL/min (ref >60)
GLUCOSE: 110 mg/dL — AB (ref 65–99)
POTASSIUM: 3.1 mmol/L — AB (ref 3.5–5.1)
Sodium: 136 mmol/L (ref 135–145)
TOTAL PROTEIN: 7 g/dL (ref 6.5–8.1)

## 2016-04-09 LAB — URINE MICROSCOPIC-ADD ON

## 2016-04-09 MED ORDER — IOPAMIDOL (ISOVUE-370) INJECTION 76%
100.0000 mL | Freq: Once | INTRAVENOUS | Status: AC | PRN
  Administered 2016-04-09: 100 mL via INTRAVENOUS

## 2016-04-09 NOTE — ED Notes (Signed)
MD at bedside--updated on pt's O2 requirements (pt as low as 86% on RA). Pt has COPD and reports oxygen use at night only. Pt currently on 1.5L [place] (sats low-mid 90s).

## 2016-04-09 NOTE — ED Provider Notes (Signed)
CSN: [id number]     Arrival date & time 04/09/16  1218 History   First MD Initiated Contact with Patient 04/09/16 1240     Chief Complaint  Patient presents with  . Fever  . Cough     Patient is a 80 y.o. female presenting with fever and cough. The history is provided by the patient.  Fever Associated symptoms: cough   Associated symptoms: no chest pain and no sore throat   Cough Associated symptoms: fever and shortness of breath   Associated symptoms: no chest pain and no sore throat   Patient resents with cough and fever. She had recent right knee replacement and states she has done worsens it happened. She is now coughing with minimal sputum production. Has had fevers at home. No dysuria. She has a history of DVT and pulmonary embolism and is on Xarelto. She was on Xarelto prior to the surgery but had 2 days off of it for the surgery. She is on oxygen for a mild hypoxia here. She has oxygen for nights at home but is not normally needed during the day. Sats of 86%. Her right knee is clean and is reportedly doing well.  Past Medical History  Diagnosis Date  . Allergic rhinitis   . Sarcoidosis ([place])   . Cardiac pacemaker in situ     DOI  08-04-2008  medtronic dual chamber  . Hyperlipidemia   . Shortness of breath     OCCASIONAL  . GERD (gastroesophageal reflux disease)   . History of pulmonary embolus (PE)   . Hypertension   . History of CVA (cerebrovascular accident)     09-15-2005  Left Cerebellar Hematoma with mild stem compression whild on coumadin for DVT  . Venous insufficiency   . History of DVT of lower extremity   . Anxiety   . Renal insufficiency   . VIN III (vulvar intraepithelial neoplasia III)   . Coronary artery disease CARDIOLOGIST-- DR Cristopher Peru    per cath-- Non-obstructive minimal CAD  . Chronic combined systolic and diastolic CHF, NYHA class 2 ([place])     [place] by cardiologist-- dr taylor--  ef 45%  . History of non-ST elevation myocardial infarction  (NSTEMI)     2011-- in setting of bilateral PE's and DVT's and CHF  . PAF (paroxysmal atrial fibrillation) ([place])   . Mild pulmonary hypertension ([place])   . History of cervical dysplasia     CIN III  . Thrombophilia ([place])     hemotologist--  dr Marin Olp--  monitors pt anitcoagulation  . Chronic anticoagulation   . History of peptic ulcer disease   . OA (osteoarthritis)   . Moderate intermittent asthma   . LBBB (left bundle branch block)   . Urgency of urination   . Incontinence of urine   . Fluid retention   . Mild obstructive sleep apnea     PT USES Humidied Nasal Mask with O2 at2.5L (study 08-05-2009)   Past Surgical History  Procedure Laterality Date  . Lumbar disc surgery  x2   1990  . Vulvar lesion removal  07/16/2012    Procedure: VULVAR LESION;  Surgeon: Imagene Gurney A. Alycia Rossetti, MD;  Location: [facility];  Service: Gynecology;  Laterality: Right;  Wide Local Excision of the Vulvar  . Total knee arthroplasty Left 04/21/2013    Procedure: LEFT TOTAL KNEE ARTHROPLASTY;  Surgeon: Gearlean Alf, MD;  Location: [facility];  Service: Orthopedics;  Laterality: Left;  . Cardiac pacemaker placement  08-04-2008  dr  taylor gregg    for CHB--  MEDTRONIC DUAL CHAMBER  . Cholecystectomy  01/1996  . Excision left supraclavicular lymph node   04-22-2007    granulomatous inflammation, extensive  . Insertion of vena cava filter  08/ 2011  . Shoulder arthroscopy with rotator cuff repair and subacromial decompression Right 02-08-2009  . Deccompression ulnar nerve at cubital tunnel and resection muscle Left 03-17-2008  . Transthoracic echocardiogram  last one 05-19-2014    mild LVH/  ef 45-50%/  septal-lateral dyssynchroncy consistant with LBBB/  diffuse hypokinesis/  grade I diastolic dysfunction/  trivial MR and TR/  mild LAE  . Cardiac catheterization  08-26-2003   dr Lorenz Coaster. non-obstructive CAD & LVD with ef 46%, global HK, 2+MR, PA press 50/24 w/ wedge 20  . Cardiac catheterization  08-03-2008   dr  Angelena Form    Non-obstructive CAD/  30-40% pLAD,  30% ostial CFX/  symptomatic bradycardia/  normal renal arteries  . Abdominal hysterectomy  1970's  . Appendectomy  1966  . Vulvectomy N/A 02/19/2015    Procedure: SIMPLE PARTIAL VULVECTOMY;  Surgeon: [name], MD;  Location: [facility];  Service: Gynecology;  Laterality: N/A;  . Cataracts removed    . Knee arthroscopy      LEFT  . Tubal ligation    . Total knee arthroplasty Right 04/03/2016    Procedure: RIGHT TOTAL KNEE ARTHROPLASTY;  Surgeon: Gaynelle Arabian, MD;  Location: [facility];  Service: Orthopedics;  Laterality: Right;   Family History  Problem Relation Age of Onset  . Heart attack Father   . Breast cancer Sister     mets  . Heart attack Brother   . Kidney failure Sister   . Colon cancer Neg Hx   . Esophageal cancer Neg Hx   . Pancreatic cancer Neg Hx   . Liver disease Neg Hx    Social History  Substance Use Topics  . Smoking status: Never Smoker   . Smokeless tobacco: Never Used     Comment: NEVER USED TOBACCO  . Alcohol Use: No   OB History    No data available     Review of Systems  Constitutional: Positive for fever, appetite change and fatigue.  HENT: Negative for sore throat.   Respiratory: Positive for cough and shortness of breath.   Cardiovascular: Negative for chest pain.  Gastrointestinal: Negative for abdominal pain.  Genitourinary: Negative for flank pain.  Musculoskeletal: Negative for back pain.  Skin: Positive for wound. Negative for color change.  Neurological: Negative for weakness.      Allergies  Imitrex and Sulfa antibiotics  Home Medications   Prior to Admission medications   Medication Sig Start Date End Date Taking? Authorizing Provider  Azelastine HCl 0.15 % SOLN Place 2 sprays into the nose 2 (two) times daily.     Historical Provider, MD  cetirizine (ZYRTEC) 10 MG tablet Take 10 mg by mouth every morning.     Historical Provider, MD  cholecalciferol (VITAMIN D)  1000 UNITS tablet Take 1,000 Units by mouth every morning.     Historical Provider, MD  clorazepate (TRANXENE) 7.5 MG tablet Take 1 tablet (7.5 mg total) by mouth 3 (three) times daily. 02/18/16   Gold[place] Circle, FNP  fluticasone (FLONASE) 50 MCG/ACT nasal spray 1-2 puffs each nostril once daily Patient taking differently: Place 2 sprays into both nostrils every morning. 1-2 puffs each nostril once daily 11/16/14   Deneise Lever, MD  fluticasone (FLOVENT  HFA) 44 MCG/ACT inhaler Inhale 2 puffs into the lungs 2 (two) times daily.    Historical Provider, MD  folic acid (FOLVITE) A999333 MCG tablet Take 400 mcg by mouth every morning.    Historical Provider, MD  furosemide (LASIX) 40 MG tablet Take 1 tablet by mouth  daily 02/18/16   Biagio Borg, MD  ipratropium-albuterol (DUONEB) 0.5-2.5 (3) MG/3ML SOLN Take 3 mLs by nebulization 2 (two) times daily. 05/19/13   Noralee Space, MD  magic mouthwash SOLN Swish and swallow 5 ml 4 times daily if needed Patient taking differently: Take 5 mLs by mouth 4 (four) times daily as needed for mouth pain (sore throat).  12/09/15   Deneise Lever, MD  methocarbamol (ROBAXIN) 500 MG tablet Take 1 tablet (500 mg total) by mouth every 6 (six) hours as needed for muscle spasms. 04/04/16   Amber Cecilio Asper, PA-C  metoprolol succinate (TOPROL-XL) 25 MG 24 hr tablet Take 1 tablet by mouth  daily before breakfast 02/18/16   Biagio Borg, MD  Multiple Vitamin (MULTIVITAMIN) tablet Take 1 tablet by mouth every morning.     Historical Provider, MD  Multiple Vitamins-Minerals (PROTEGRA PO) Take 1 tablet by mouth every morning.    Historical Provider, MD  oxyCODONE (OXY IR/ROXICODONE) 5 MG immediate release tablet Take 1-2 tablets (5-10 mg total) by mouth every 3 (three) hours as needed for breakthrough pain. 04/04/16   Amber Cecilio Asper, PA-C  pantoprazole (PROTONIX) 40 MG tablet Take 1 tablet (40 mg total) by mouth 2 (two) times daily. 09/08/15   Biagio Borg, MD  Polyethyl Glycol-Propyl  Glycol (SYSTANE) 0.4-0.3 % SOLN Place 2 drops into both eyes 4 (four) times daily.     Historical Provider, MD  pravastatin (PRAVACHOL) 40 MG tablet Take 1 tablet by mouth at  bedtime 02/18/16   Biagio Borg, MD  sennosides-docusate sodium (SENOKOT-S) 8.6-50 MG tablet Take 2 tablets by mouth at bedtime.     Historical Provider, MD  traMADol (ULTRAM) 50 MG tablet Take 1-2 tablets (50-100 mg total) by mouth every 6 (six) hours as needed for moderate pain. 04/04/16   Amber Constable, PA-C  XARELTO 20 MG TABS tablet Take 1 tablet by mouth  daily Patient taking differently: Take 1 tablet by mouth  every evening at 5 pm. 10/08/15   Volanda Napoleon, MD   BP 121/93 mmHg  Pulse 97  Temp(Src) 100.5 F (38.1 C) (Rectal)  Resp 18  Ht 5' 4.5" (1.638 m)  Wt 190 lb (86.183 kg)  BMI 32.12 kg/m2  SpO2 99% Physical Exam  Constitutional: She appears well-developed.  HENT:  Head: Atraumatic.  Neck: Neck supple.  Pulmonary/Chest: Effort normal.  Slightly harsh breath sounds without frank wheezes.  Abdominal: Soft.  Musculoskeletal:  Mild edema to right lower leg. Dressing of wound is clean dry and intact.  Neurological: She is alert.  Skin: Skin is warm.    ED Course  Procedures (including critical care time) Labs Review Labs Reviewed  COMPREHENSIVE METABOLIC PANEL - Abnormal; Notable for the following:    Potassium 3.1 (*)    Chloride 96 (*)    Glucose, Bld 110 (*)    Calcium 8.2 (*)    Albumin 3.3 (*)    AST 46 (*)    Total Bilirubin 2.0 (*)    All other components within normal limits  CBC WITH DIFFERENTIAL/PLATELET - Abnormal; Notable for the following:    RBC 3.52 (*)    Hemoglobin 10.7 (*)  HCT 33.1 (*)    All other components within normal limits  URINALYSIS, ROUTINE W REFLEX MICROSCOPIC (NOT AT [facility]) - Abnormal; Notable for the following:    APPearance CLOUDY (*)    Nitrite POSITIVE (*)    Leukocytes, UA SMALL (*)    All other components within normal limits  URINE  MICROSCOPIC-ADD ON - Abnormal; Notable for the following:    Squamous Epithelial / LPF 0-5 (*)    Bacteria, UA MANY (*)    All other components within normal limits  URINE CULTURE  CULTURE, BLOOD (ROUTINE X 2)  CULTURE, BLOOD (ROUTINE X 2)  I-STAT CG4 LACTIC ACID, ED  I-STAT CG4 LACTIC ACID, ED    Imaging Review Dg Chest 2 View  04/09/2016  CLINICAL DATA:  80 y/o female with dry cough and fever for 3 days. Recent RIGHT knee replacement this previous Monday. Hx MI, COPD, CHF, asthma, bronchitis, HTN - on meds, non-smoker, sarcoidosis, SOB, pacemaker 2009, CAD, PAF EXAM: CHEST  2 VIEW COMPARISON:  12/13/2015 FINDINGS: Left-sided transvenous pacemaker leads to the right atrium and right ventricle. The heart is normal in size. There are no focal consolidations or pleural effusions. Mild bibasilar atelectasis or scarring appears stable over prior studies. No pulmonary edema. IMPRESSION: 1.  No evidence for acute cardiopulmonary abnormality. 2. Stable, chronic changes at the right lung base. Electronically Signed   By: Nolon Nations M.D.   On: 04/09/2016 13:48   Ct Angio Chest Pe W/cm &/or Wo Cm  04/09/2016  CLINICAL DATA:  Recent knee replacement.  Fever several days. EXAM: CT ANGIOGRAPHY CHEST WITH CONTRAST TECHNIQUE: Multidetector CT imaging of the chest was performed using the standard protocol during bolus administration of intravenous contrast. Multiplanar CT image reconstructions and MIPs were obtained to evaluate the vascular anatomy. CONTRAST:  100 mL Isovue COMPARISON:  None. FINDINGS: Mediastinum/Nodes: Within the RIGHT lower lobe pulmonary artery there are linear web-like filling defects (image [id number] series 5; coronal image 74 series 7 and sagittal image 66, series 8). This pattern of web-like filling defects is suggestive of synechiae of remote pulmonary embolism rather than acute pulmonary embolus. No additional filling defects within the pulmonary arteries to localize pulmonary  embolism. No acute findings aorta great vessels. No axillary supraclavicular adenopathy. No mediastinal hilar adenopathy. Calcified mediastinal lymph nodes are present. Lungs/Pleura: There is atelectasis in the RIGHT lower lobe. No pulmonary infarction identified. No pulmonary edema or pneumothorax. Upper abdomen: IVC filter in infrarenal location. Postcholecystectomy. Venous collaterals in the anterior abdominal wall. Musculoskeletal: No aggressive osseous lesion. Review of the MIP images confirms the above findings. IMPRESSION: 1. Web-like synechiae within the proximal RIGHT lower lobe pulmonary artery is most consistent with remodeling of a remote pulmonary embolism rather than acute pulmonary embolism. No tubular occlusive filling defects to suggest acute pulmonary embolism. 2. Mild atelectasis the RIGHT lung base. 3. IVC filter in infrarenal location. 4. Venous collaterals in the anterior abdominal wall and chest wall is suggestive of chronic IVC occlusion. Electronically Signed   By: Suzy Bouchard M.D.   On: 04/09/2016 15:17   I have personally reviewed and evaluated these images and lab results as part of my medical decision-making.   EKG Interpretation   Date/Time:  Sunday April 09 2016 12:42:36 EDT Ventricular Rate:  100 PR Interval:  165 QRS Duration: 167 QT Interval:  392 QTC Calculation: 506 R Axis:   -55 Text Interpretation:  Sinus tachycardia Multiple premature complexes, vent  & supraven Left bundle branch block Confirmed by  Kharter Brew  MD, Ovid Curd  [phone]) on 04/09/2016 12:51:52 PM Also confirmed by Alvino Chapel  MD, Braylan Faul  [phone]), editor [place], [place], [place] [phone])  on 04/09/2016 12:55:44 PM      MDM   Final diagnoses:  Post-procedural fever  Cough      Patient with fever postoperatively. Wound is well-appearing. History of PE and is on chronic anticoagulation. Urine does not show infection. Negative CTA of the chest for infection. Lab work overall reassuring. Patient has  some mild hypoxia but appears to be close to her baseline. She has been coughing this could be URI. Blood culture will be sent. Lactic acid is normal. Patient is eager to go home and I think she is safe to go there. Will discharge.    Davonna Belling, MD 04/09/16 [phone]

## 2016-04-09 NOTE — ED Notes (Signed)
Patient c/o cough/fever for the past few days & chest pain from coughing, R knee surgery on the 17th. Per family she has been have a fever at times. She has not had a bowel movement since the surgery.

## 2016-04-09 NOTE — Discharge Instructions (Signed)
Fever, Adult A fever is an increase in the body's temperature. It is usually defined as a temperature of 100F (38C) or higher. Brief mild or moderate fevers generally have no long-term effects, and they often do not require treatment. Moderate or high fevers may make you feel uncomfortable and can sometimes be a sign of a serious illness or disease. The sweating that may occur with repeated or prolonged fever may also cause dehydration. Fever is confirmed by taking a temperature with a thermometer. A measured temperature can vary with:  Age.  Time of day.  Location of the thermometer:  Mouth (oral).  Rectum (rectal).  Ear (tympanic).  Underarm (axillary).  Forehead (temporal). HOME CARE INSTRUCTIONS Pay attention to any changes in your symptoms. Take these actions to help with your condition:  Take over-the counter and prescription medicines only as told by your health care provider. Follow the dosing instructions carefully.  If you were prescribed an antibiotic medicine, take it as told by your health care provider. Do not stop taking the antibiotic even if you start to feel better.  Rest as needed.  Drink enough fluid to keep your urine clear or pale yellow. This helps to prevent dehydration.  Sponge yourself or bathe with room-temperature water to help reduce your body temperature as needed. Do not use ice water.  Do not overbundle yourself in blankets or heavy clothes. SEEK MEDICAL CARE IF:  You vomit.  You cannot eat or drink without vomiting.  You have diarrhea.  You have pain when you urinate.  Your symptoms do not improve with treatment.  You develop new symptoms.  You develop excessive weakness. SEEK IMMEDIATE MEDICAL CARE IF:  You have shortness of breath or have trouble breathing.  You are dizzy or you faint.  You are disoriented or confused.  You develop signs of dehydration, such as a dry mouth, decreased urination, or paleness.  You develop  severe pain in your abdomen.  You have persistent vomiting or diarrhea.  You develop a skin rash.  Your symptoms suddenly get worse.   This information is not intended to replace advice given to you by your health care provider. Make sure you discuss any questions you have with your health care provider.   Document Released: 05/30/2001 Document Revised: 08/25/2015 Document Reviewed: 01/28/2015 Elsevier Interactive Patient Education 2016 [employer]  Cough, Adult Coughing is a reflex that clears your throat and your airways. Coughing helps to heal and protect your lungs. It is normal to cough occasionally, but a cough that happens with other symptoms or lasts a long time may be a sign of a condition that needs treatment. A cough may last only 2-3 weeks (acute), or it may last longer than 8 weeks (chronic). CAUSES Coughing is commonly caused by:  Breathing in substances that irritate your lungs.  A viral or bacterial respiratory infection.  Allergies.  Asthma.  Postnasal drip.  Smoking.  Acid backing up from the stomach into the esophagus (gastroesophageal reflux).  Certain medicines.  Chronic lung problems, including COPD (or rarely, lung cancer).  Other medical conditions such as heart failure. HOME CARE INSTRUCTIONS  Pay attention to any changes in your symptoms. Take these actions to help with your discomfort:  Take medicines only as told by your health care provider.  If you were prescribed an antibiotic medicine, take it as told by your health care provider. Do not stop taking the antibiotic even if you start to feel better.  Talk with your health care  provider before you take a cough suppressant medicine.  Drink enough fluid to keep your urine clear or pale yellow.  If the air is dry, use a cold steam vaporizer or humidifier in your bedroom or your home to help loosen secretions.  Avoid anything that causes you to cough at work or at home.  If your cough is  worse at night, try sleeping in a semi-upright position.  Avoid cigarette smoke. If you smoke, quit smoking. If you need help quitting, ask your health care provider.  Avoid caffeine.  Avoid alcohol.  Rest as needed. SEEK MEDICAL CARE IF:   You have new symptoms.  You cough up pus.  Your cough does not get better after 2-3 weeks, or your cough gets worse.  You cannot control your cough with suppressant medicines and you are losing sleep.  You develop pain that is getting worse or pain that is not controlled with pain medicines.  You have a fever.  You have unexplained weight loss.  You have night sweats. SEEK IMMEDIATE MEDICAL CARE IF:  You cough up blood.  You have difficulty breathing.  Your heartbeat is very fast.   This information is not intended to replace advice given to you by your health care provider. Make sure you discuss any questions you have with your health care provider.   Document Released: 06/02/2011 Document Revised: 08/25/2015 Document Reviewed: 02/10/2015 Elsevier Interactive Patient Education [employer].

## 2016-04-11 ENCOUNTER — Ambulatory Visit (INDEPENDENT_AMBULATORY_CARE_PROVIDER_SITE_OTHER): Payer: Medicare Other | Admitting: Internal Medicine

## 2016-04-11 ENCOUNTER — Encounter: Payer: Self-pay | Admitting: Internal Medicine

## 2016-04-11 ENCOUNTER — Other Ambulatory Visit (INDEPENDENT_AMBULATORY_CARE_PROVIDER_SITE_OTHER): Payer: Medicare Other

## 2016-04-11 VITALS — BP 136/78 | HR 89 | Temp 98.2°F | Resp 20 | Wt 189.0 lb

## 2016-04-11 DIAGNOSIS — R05 Cough: Secondary | ICD-10-CM | POA: Diagnosis not present

## 2016-04-11 DIAGNOSIS — Z0001 Encounter for general adult medical examination with abnormal findings: Secondary | ICD-10-CM

## 2016-04-11 DIAGNOSIS — R059 Cough, unspecified: Secondary | ICD-10-CM

## 2016-04-11 DIAGNOSIS — R6889 Other general symptoms and signs: Secondary | ICD-10-CM | POA: Diagnosis not present

## 2016-04-11 DIAGNOSIS — N3000 Acute cystitis without hematuria: Secondary | ICD-10-CM | POA: Diagnosis not present

## 2016-04-11 DIAGNOSIS — J9611 Chronic respiratory failure with hypoxia: Secondary | ICD-10-CM | POA: Diagnosis not present

## 2016-04-11 DIAGNOSIS — J9610 Chronic respiratory failure, unspecified whether with hypoxia or hypercapnia: Secondary | ICD-10-CM | POA: Insufficient documentation

## 2016-04-11 DIAGNOSIS — E876 Hypokalemia: Secondary | ICD-10-CM

## 2016-04-11 DIAGNOSIS — N390 Urinary tract infection, site not specified: Secondary | ICD-10-CM | POA: Insufficient documentation

## 2016-04-11 LAB — LIPID PANEL
Cholesterol: 119 mg/dL (ref 0–200)
HDL: 32.8 mg/dL — ABNORMAL LOW (ref >39.00)
LDL Cholesterol: 66 mg/dL (ref 0–99)
NONHDL: 86.54
Total CHOL/HDL Ratio: 4
Triglycerides: 104 mg/dL (ref 0.0–149.0)
VLDL: 20.8 mg/dL (ref 0.0–40.0)

## 2016-04-11 LAB — BASIC METABOLIC PANEL
BUN: 10 mg/dL (ref 6–23)
CO2: 37 mEq/L — ABNORMAL HIGH (ref 19–32)
Calcium: 8.9 mg/dL (ref 8.4–10.5)
Chloride: 97 mEq/L (ref 96–112)
Creatinine, Ser: 0.88 mg/dL (ref 0.40–1.20)
GFR: 66.15 mL/min (ref >60.00)
GLUCOSE: 108 mg/dL — AB (ref 70–99)
Potassium: 3.7 mEq/L (ref 3.5–5.1)
Sodium: 139 mEq/L (ref 135–145)

## 2016-04-11 LAB — URINE CULTURE

## 2016-04-11 MED ORDER — HYDROCODONE-HOMATROPINE 5-1.5 MG/5ML PO SYRP: 5.0000 mL | ORAL_SOLUTION | Freq: Four times a day (QID) | ORAL | Status: DC | PRN

## 2016-04-11 MED ORDER — PREDNISONE 10 MG PO TABS: ORAL_TABLET | ORAL | Status: DC

## 2016-04-11 MED ORDER — POTASSIUM CHLORIDE ER 10 MEQ PO TBCR: 10.0000 meq | EXTENDED_RELEASE_TABLET | Freq: Every day | ORAL | Status: DC

## 2016-04-11 NOTE — Patient Instructions (Signed)
Please take all new medication as prescribed  - the [facility] ([facility])  Please take all new medication as prescribed - [facility]  Please take all new medication as prescribed  - the cough medicine  (done in hardcopy)  [facility] by this evening when the "ID and sensitivites" of the urine culture is known  Please continue all other medications as before, and refills have been done if requested.  Please have the pharmacy call with any other refills you may need.  Please continue your efforts at being more active, low cholesterol diet, and weight control.  You are otherwise up to date with prevention measures today.  Please keep your appointments with your specialists as you may have planned  Please go to the LAB in the Basement (turn left off the elevator) for the tests to be done in 2 weeks  /You will be contacted by phone if any changes need to be made immediately.  Otherwise, you will receive a letter about your results with an explanation, but please check with MyChart first.  Please remember to sign up for MyChart if you have not done so, as this will be important to you in the future with finding out test results, communicating by private email, and scheduling acute appointments online when needed.  Please return in 6 months, or sooner if needed

## 2016-04-11 NOTE — Progress Notes (Signed)
Pre visit review using our clinic review tool, if applicable. No additional management support is needed unless otherwise documented below in the visit note. 

## 2016-04-11 NOTE — Progress Notes (Signed)
Subjective:    Patient ID: [name] [name], female    DOB: Sep 27, 1936, 80 y.o.   MRN: [id number]  HPI  Here for wellness and f/u;  Overall doing ok;  Pt denies Chest pain, worsening SOB, DOE, wheezing, orthopnea, PND, worsening LE edema, palpitations, dizziness or syncope.  Pt denies neurological change such as new headache, facial or extremity weakness.  Pt denies polydipsia, polyuria, or low sugar symptoms. Pt states overall good compliance with treatment and medications, good tolerability, and has been trying to follow appropriate diet.  Pt denies worsening depressive symptoms, suicidal ideation or panic. No fever, night sweats, wt loss, loss of appetite, or other constitutional symptoms.  Pt states good ability with ADL's, has high fall risk at the moment after right knee TKA at [place], getting PT started soon at home, home safety reviewed and adequate, no other significant changes in hearing or vision, and only occasionally active with exercise prior to [facility] as she was limited due to knee pain and risk of fall.  Seen in ER apr 23 with cough  - Has persistent nonprod cough x 3-4 wks, just not going away, + hx of sarcoid., asthma, prednisone has helped in the past.  CTA neg for acute PE, PNA or CHF.  UA neg, but urine cx noted abnormalf > 100l E coli, wit ID and sens not yet returned. Had low K 3.1 on chronic lasix, likely needs daily supplement.  Due for chol check as well. Past Medical History  Diagnosis Date  . Allergic rhinitis   . Sarcoidosis ([place])   . Cardiac pacemaker in situ     DOI  08-04-2008  medtronic dual chamber  . Hyperlipidemia   . Shortness of breath     OCCASIONAL  . GERD (gastroesophageal reflux disease)   . History of pulmonary embolus (PE)   . Hypertension   . History of CVA (cerebrovascular accident)     09-15-2005  Left Cerebellar Hematoma with mild stem compression whild on coumadin for DVT  . Venous insufficiency   . History of DVT of lower extremity   .  Anxiety   . Renal insufficiency   . VIN III (vulvar intraepithelial neoplasia III)   . Coronary artery disease CARDIOLOGIST-- DR Cristopher Peru    per cath-- Non-obstructive minimal CAD  . Chronic combined systolic and diastolic CHF, NYHA class 2 ([place])     montior by cardiologist-- dr taylor--  ef 45%  . History of non-ST elevation myocardial infarction (NSTEMI)     2011-- in setting of bilateral PE's and DVT's and CHF  . PAF (paroxysmal atrial fibrillation) ([place])   . Mild pulmonary hypertension ([place])   . History of cervical dysplasia     CIN III  . Thrombophilia ([place])     hemotologist--  dr Marin Olp--  monitors pt anitcoagulation  . Chronic anticoagulation   . History of peptic ulcer disease   . OA (osteoarthritis)   . Moderate intermittent asthma   . LBBB (left bundle branch block)   . Urgency of urination   . Incontinence of urine   . Fluid retention   . Mild obstructive sleep apnea     PT USES Humidied Nasal Mask with O2 at2.5L (study 08-05-2009)   Past Surgical History  Procedure Laterality Date  . Lumbar disc surgery  x2   1990  . Vulvar lesion removal  07/16/2012    Procedure: VULVAR LESION;  Surgeon: Imagene Gurney A. Alycia Rossetti, MD;  Location: [facility];  Service: Gynecology;  Laterality: Right;  Wide Local Excision of the Vulvar  . Total knee arthroplasty Left 04/21/2013    Procedure: LEFT TOTAL KNEE ARTHROPLASTY;  Surgeon: Gearlean Alf, MD;  Location: [facility];  Service: Orthopedics;  Laterality: Left;  . Cardiac pacemaker placement  08-04-2008  dr taylor gregg    for CHB--  MEDTRONIC DUAL CHAMBER  . Cholecystectomy  01/1996  . Excision left supraclavicular lymph node   04-22-2007    granulomatous inflammation, extensive  . Insertion of vena cava filter  08/ 2011  . Shoulder arthroscopy with rotator cuff repair and subacromial decompression Right 02-08-2009  . Deccompression ulnar nerve at cubital tunnel and resection muscle Left 03-17-2008  . Transthoracic echocardiogram  last one  05-19-2014    mild LVH/  ef 45-50%/  septal-lateral dyssynchroncy consistant with LBBB/  diffuse hypokinesis/  grade I diastolic dysfunction/  trivial MR and TR/  mild LAE  . Cardiac catheterization  08-26-2003   dr Lorenz Coaster. non-obstructive CAD & LVD with ef 46%, global HK, 2+MR, PA press 50/24 w/ wedge 20  . Cardiac catheterization  08-03-2008   dr Angelena Form    Non-obstructive CAD/  30-40% pLAD,  30% ostial CFX/  symptomatic bradycardia/  normal renal arteries  . Abdominal hysterectomy  1970's  . Appendectomy  1966  . Vulvectomy N/A 02/19/2015    Procedure: SIMPLE PARTIAL VULVECTOMY;  Surgeon: [name], MD;  Location[facility];  Service: Gynecology;  Laterality: N/A;  . Cataracts removed    . Knee arthroscopy      LEFT  . Tubal ligation    . Total knee arthroplasty Right 04/03/2016    Procedure: RIGHT TOTAL KNEE ARTHROPLASTY;  Surgeon: Gaynelle Arabian, MD;  Location: [facility];  Service: Orthopedics;  Laterality: Right;    reports that she has never smoked. She has never used smokeless tobacco. She reports that she does not drink alcohol or use illicit drugs. family history includes Breast cancer in her sister; Heart attack in her brother and father; Kidney failure in her sister. There is no history of Colon cancer, Esophageal cancer, Pancreatic cancer, or Liver disease. Allergies  Allergen Reactions  . Imitrex [Sumatriptan] Other (See Comments)    Reaction unknown  . Sulfa Antibiotics Nausea Only    Doesn't like to drink lots of water.   Current Outpatient Prescriptions on File Prior to Visit  Medication Sig Dispense Refill  . Azelastine HCl 0.15 % SOLN Place 2 sprays into the nose 2 (two) times daily.     . cetirizine (ZYRTEC) 10 MG tablet Take 10 mg by mouth every morning.     . cholecalciferol (VITAMIN D) 1000 UNITS tablet Take 1,000 Units by mouth every morning.     . clorazepate (TRANXENE) 7.5 MG tablet Take 1 tablet (7.5 mg total) by mouth 3 (three) times  daily. 90 tablet 0  . fluticasone (FLONASE) 50 MCG/ACT nasal spray 1-2 puffs each nostril once daily (Patient taking differently: Place 2 sprays into both nostrils every morning. 1-2 puffs each nostril once daily) 48 g 3  . fluticasone (FLOVENT HFA) 44 MCG/ACT inhaler Inhale 2 puffs into the lungs 2 (two) times daily.    . folic acid (FOLVITE) A999333 MCG tablet Take 400 mcg by mouth every morning.    . furosemide (LASIX) 40 MG tablet Take 1 tablet by mouth  daily 90 tablet 0  . ipratropium-albuterol (DUONEB) 0.5-2.5 (3) MG/3ML SOLN Take 3 mLs by nebulization 2 (two) times daily. 180 mL  5  . magic mouthwash SOLN Swish and swallow 5 ml 4 times daily if needed (Patient taking differently: Take 5 mLs by mouth 4 (four) times daily as needed for mouth pain (sore throat). ) 200 mL 2  . methocarbamol (ROBAXIN) 500 MG tablet Take 1 tablet (500 mg total) by mouth every 6 (six) hours as needed for muscle spasms. 40 tablet 1  . metoprolol succinate (TOPROL-XL) 25 MG 24 hr tablet Take 1 tablet by mouth  daily before breakfast 90 tablet 0  . Multiple Vitamin (MULTIVITAMIN) tablet Take 1 tablet by mouth every morning.     . Multiple Vitamins-Minerals (PROTEGRA PO) Take 1 tablet by mouth every morning.    [name] Kitchen oxyCODONE (OXY IR/ROXICODONE) 5 MG immediate release tablet Take 1-2 tablets (5-10 mg total) by mouth every 3 (three) hours as needed for breakthrough pain. 80 tablet 0  . pantoprazole (PROTONIX) 40 MG tablet Take 1 tablet (40 mg total) by mouth 2 (two) times daily. 180 tablet 3  . Polyethyl Glycol-Propyl Glycol (SYSTANE) 0.4-0.3 % SOLN Place 2 drops into both eyes 4 (four) times daily.     . pravastatin (PRAVACHOL) 40 MG tablet Take 1 tablet by mouth at  bedtime 90 tablet 0  . sennosides-docusate sodium (SENOKOT-S) 8.6-50 MG tablet Take 2 tablets by mouth at bedtime.     . traMADol (ULTRAM) 50 MG tablet Take 1-2 tablets (50-100 mg total) by mouth every 6 (six) hours as needed for moderate pain. 60 tablet 0  .  XARELTO 20 MG TABS tablet Take 1 tablet by mouth  daily (Patient taking differently: Take 1 tablet by mouth  every evening at 5 pm.) 90 tablet 3   No current facility-administered medications on file prior to visit.   Review of Systems Constitutional: Negative for increased diaphoresis, or other activity, appetite or siginficant weight change other than noted HENT: Negative for worsening hearing loss, ear pain, facial swelling, mouth sores and neck stiffness.   Eyes: Negative for other worsening pain, redness or visual disturbance.  Respiratory: Negative for choking or stridor Cardiovascular: Negative for other chest pain and palpitations.  Gastrointestinal: Negative for worsening diarrhea, blood in stool, or abdominal distention Genitourinary: Negative for hematuria, flank pain or change in urine volume.  Musculoskeletal: Negative for myalgias or other joint complaints.  Skin: Negative for other color change and wound or drainage.  Neurological: Negative for syncope and numbness. other than noted Hematological: Negative for adenopathy. or other swelling Psychiatric/Behavioral: Negative for hallucinations, SI, self-injury, decreased concentration or other worsening agitation.      Objective:   Physical Exam BP 136/78 mmHg  Pulse 89  Temp(Src) 98.2 F (36.8 C) (Oral)  Resp 20  Wt 189 lb (85.73 kg)  SpO2 96% VS noted, fatigued but alert Constitutional: Pt is oriented to person, place, and time. Appears well-developed and well-nourished, in no significant distress Head: Normocephalic and atraumatic  Eyes: Conjunctivae and EOM are normal. Pupils are equal, round, and reactive to light Right Ear: External ear normal.  Left Ear: External ear normal Nose: Nose normal.  Mouth/Throat: Oropharynx is clear and moist  Neck: Normal range of motion. Neck supple. No JVD present. No tracheal deviation present or significant neck LA or mass Cardiovascular: Normal rate, regular rhythm, normal  heart sounds and intact distal pulses.   Pulmonary/Chest: Effort normal and breath sounds decreased without rales or wheezing  Abdominal: Soft. Bowel sounds are normal. NT. No HSM  Musculoskeletal: Normal range of motion. Exhibits no edema Lymphadenopathy: Has no  cervical adenopathy.  Neurological: Pt is alert and oriented to person, place, and time. Pt has normal reflexes. No cranial nerve deficit. Motor grossly intact Skin: Skin is warm and dry. No rash noted or new ulcers, right knee incision intact Psychiatric:  Has normal mood and affect. Behavior is normal.     2d ago    Specimen Description URINE, CLEAN CATCH   Special Requests NONE   Culture >=100,000 COLONIES/mL ESCHERICHIA COLI (A)   Report Status PENDING   Resulting Agency [facility]      Specimen Collected: 04/09/16 1:45 PM Last Resulted: 04/10/16 2:37 PM        Blood cx's negative    Assessment & Plan:

## 2016-04-12 ENCOUNTER — Telehealth: Payer: Self-pay | Admitting: *Deleted

## 2016-04-12 NOTE — ED Notes (Signed)
Post ED Visit - Positive Culture Follow-up  Culture report reviewed by antimicrobial stewardship pharmacist:  []  Elenor Quinones, Pharm.D. []  Heide Guile, Pharm.D., BCPS []  Parks Neptune, Pharm.D. []  Alycia Rossetti, Pharm.D., BCPS []  [place], Pharm.D., BCPS, AAHIVP []  Legrand Como, Pharm.D., BCPS, AAHIVP []  Milus Glazier, Pharm.D. []  Stephens November, Pharm.D.  Positive urine culture Asymptomatic for UTI, MD suspected URI.  No further patient follow-up is required at this time per [facility].[name] Kitchen  Harlon Flor [facility] 04/12/2016, 11:02 AM

## 2016-04-12 NOTE — Progress Notes (Signed)
ED Antimicrobial Stewardship Positive Culture Follow Up   [name] [name] is an 80 y.o. female who presented to [facility] on 04/09/2016 with a chief complaint of  Chief Complaint  Patient presents with  . Fever  . Cough    Recent Results (from the past 720 hour(s))  Surgical pcr screen     Status: None   Collection Time: 03/24/16 11:40 AM  Result Value Ref Range Status   MRSA, PCR NEGATIVE NEGATIVE Final   Staphylococcus aureus NEGATIVE NEGATIVE Final    Comment:        The Xpert SA Assay (FDA approved for NASAL specimens in patients over 85 years of age), is one component of a comprehensive surveillance program.  Test performance has been validated by [facility] for patients greater than or equal to 51 year old. It is not intended to diagnose infection nor to guide or monitor treatment.   Culture, blood (routine x 2)     Status: None (Preliminary result)   Collection Time: 04/09/16  1:05 PM  Result Value Ref Range Status   Specimen Description BLOOD LEFT ANTECUBITAL  Final   Special Requests BOTTLES DRAWN AEROBIC AND ANAEROBIC 5CC EACH  Final   Culture   Final    NO GROWTH 2 DAYS Performed at [facility]    Report Status PENDING  Incomplete  Urine culture     Status: Abnormal   Collection Time: 04/09/16  1:45 PM  Result Value Ref Range Status   Specimen Description URINE, CLEAN CATCH  Final   Special Requests NONE  Final   Culture >=100,000 COLONIES/mL ESCHERICHIA COLI (A)  Final   Report Status 04/11/2016 FINAL  Final   Organism ID, Bacteria ESCHERICHIA COLI (A)  Final      Susceptibility   Escherichia coli - MIC*    AMPICILLIN >=32 RESISTANT Resistant     CEFAZOLIN 32 INTERMEDIATE Intermediate     CEFTRIAXONE <=1 SENSITIVE Sensitive     CIPROFLOXACIN >=4 RESISTANT Resistant     GENTAMICIN <=1 SENSITIVE Sensitive     IMIPENEM <=0.25 SENSITIVE Sensitive     NITROFURANTOIN 128 RESISTANT Resistant     TRIMETH/SULFA <=20 SENSITIVE Sensitive    AMPICILLIN/SULBACTAM >=32 RESISTANT Resistant     PIP/TAZO >=128 RESISTANT Resistant     * >=100,000 COLONIES/mL ESCHERICHIA COLI  Culture, blood (routine x 2)     Status: None (Preliminary result)   Collection Time: 04/09/16  3:45 PM  Result Value Ref Range Status   Specimen Description BLOOD RIGHT HAND  Final   Special Requests BOTTLES DRAWN AEROBIC AND ANAEROBIC 5CC EACH  Final   Culture   Final    NO GROWTH 2 DAYS Performed at [facility]    Report Status PENDING  Incomplete   This patient came in with cough and fever, with no complaints of dysuria. Suspected URI. No treatment for asymptomatic bacteriuria indicated  ED Provider: Bernerd Limbo PA-C   Liliane Shi 04/12/2016, 8:29 AM PharmD Candidate

## 2016-04-13 ENCOUNTER — Other Ambulatory Visit: Payer: Self-pay | Admitting: Internal Medicine

## 2016-04-13 ENCOUNTER — Encounter: Payer: Self-pay | Admitting: Cardiology

## 2016-04-14 ENCOUNTER — Telehealth: Payer: Self-pay | Admitting: Internal Medicine

## 2016-04-14 LAB — CULTURE, BLOOD (ROUTINE X 2)
CULTURE: NO GROWTH
Culture: NO GROWTH

## 2016-04-14 NOTE — Assessment & Plan Note (Addendum)
?   URI vs lower resp tx, hx of sarcoid, for trial empiric predpac asd,  to f/u any worsening symptoms or concerns  In addition to the time spent performing CPE, I spent an additional 40 minutes face to face,in which greater than 50% of this time was spent in counseling and coordination of care for patient's acute illness as documented.

## 2016-04-14 NOTE — Telephone Encounter (Signed)
States patient seen Dr. Jenny Reichmann on Tuesday and [facility] [address]  States pharmacy does not have script.  Please call patient back in regards at [phone].

## 2016-04-14 NOTE — Assessment & Plan Note (Signed)
stable overall by history and exam, recent data reviewed with pt, and pt to continue medical treatment as before,  to f/u any worsening symptoms or concerns SpO2 Readings from Last 3 Encounters:  04/11/16 96%  04/09/16 93%  04/05/16 86%

## 2016-04-14 NOTE — Assessment & Plan Note (Signed)

## 2016-04-14 NOTE — Assessment & Plan Note (Signed)
Asympt, urine cx noted, will need to wait for sens's and may not require tx,  to f/u any worsening symptoms or concerns

## 2016-04-14 NOTE — Assessment & Plan Note (Signed)
Mild low, possibly releated to ongoing diuretic use, for K supplement with diuretic, f/u lab in 2 wks

## 2016-04-17 MED ORDER — CEPHALEXIN 500 MG PO CAPS: 500.0000 mg | ORAL_CAPSULE | Freq: Four times a day (QID) | ORAL | Status: DC

## 2016-04-17 NOTE — Telephone Encounter (Signed)
I saw a note per the ED that tx was not felt required.  The urine cx does however consistent of a resistent type of e coli bacteria, but may be treatable with keflex.    This will be sent to pharmacy

## 2016-04-17 NOTE — Telephone Encounter (Signed)
Please advise, thanks.

## 2016-04-18 MED ORDER — HYDROCODONE-HOMATROPINE 5-1.5 MG/5ML PO SYRP: 5.0000 mL | ORAL_SOLUTION | Freq: Four times a day (QID) | ORAL | Status: DC | PRN

## 2016-04-18 NOTE — Telephone Encounter (Signed)
Done hardcopy to Corinne  

## 2016-04-18 NOTE — Telephone Encounter (Signed)
Patient aware of Dr. Jenny Reichmann note, patient is also requesting we send her a refill of hydrocodone syrup, please advise

## 2016-04-19 ENCOUNTER — Ambulatory Visit: Payer: Medicare Other | Admitting: Internal Medicine

## 2016-04-19 NOTE — Telephone Encounter (Signed)
Medication sent to pharmacy  

## 2016-04-24 ENCOUNTER — Encounter: Payer: Self-pay | Admitting: Family

## 2016-04-24 ENCOUNTER — Ambulatory Visit (INDEPENDENT_AMBULATORY_CARE_PROVIDER_SITE_OTHER): Payer: Medicare Other | Admitting: Family

## 2016-04-24 VITALS — BP 138/80 | HR 98 | Temp 98.2°F | Ht 64.5 in | Wt 191.8 lb

## 2016-04-24 DIAGNOSIS — R059 Cough, unspecified: Secondary | ICD-10-CM

## 2016-04-24 DIAGNOSIS — R05 Cough: Secondary | ICD-10-CM

## 2016-04-24 MED ORDER — BENZONATATE 100 MG PO CAPS: 100.0000 mg | ORAL_CAPSULE | Freq: Three times a day (TID) | ORAL | Status: DC | PRN

## 2016-04-24 NOTE — Progress Notes (Signed)
Subjective:    Patient ID: [name] [name], female    DOB: 04-03-1936, 80 y.o.   MRN: [id number]   [name] [name] is a 80 y.o. female who presents today for an acute visit.    HPI Comments: Patient here for evaluation of nonproductive cough 3 weeks. Worse at night. Taking Hycodan cough syrup ( on second bottle) with mild relief. She is also on Keflex or UTI.   Recent knee replacement surgery 4/17. On tramadol for knee pain.   She has extensive history of  bronchitis, chronic respiratory failure.H/o seasonal allergies, GERD.  Per chart review, CT Angio chest shows right lower lobe removed pulmonary embolism. Patient on Xarelto. Patient follows allergist and cardiologist.   Past Medical History  Diagnosis Date  . Allergic rhinitis   . Sarcoidosis ([place])   . Cardiac pacemaker in situ     DOI  08-04-2008  medtronic dual chamber  . Hyperlipidemia   . Shortness of breath     OCCASIONAL  . GERD (gastroesophageal reflux disease)   . History of pulmonary embolus (PE)   . Hypertension   . History of CVA (cerebrovascular accident)     09-15-2005  Left Cerebellar Hematoma with mild stem compression whild on coumadin for DVT  . Venous insufficiency   . History of DVT of lower extremity   . Anxiety   . Renal insufficiency   . VIN III (vulvar intraepithelial neoplasia III)   . Coronary artery disease CARDIOLOGIST-- DR Cristopher Peru    per cath-- Non-obstructive minimal CAD  . Chronic combined systolic and diastolic CHF, NYHA class 2 ([place])     montior by cardiologist-- dr taylor--  ef 45%  . History of non-ST elevation myocardial infarction (NSTEMI)     2011-- in setting of bilateral PE's and DVT's and CHF  . PAF (paroxysmal atrial fibrillation) ([place])   . Mild pulmonary hypertension ([place])   . History of cervical dysplasia     CIN III  . Thrombophilia ([place])     hemotologist--  dr Marin Olp--  monitors pt anitcoagulation  . Chronic anticoagulation   . History of peptic ulcer disease    . OA (osteoarthritis)   . Moderate intermittent asthma   . LBBB (left bundle branch block)   . Urgency of urination   . Incontinence of urine   . Fluid retention   . Mild obstructive sleep apnea     PT USES Humidied Nasal Mask with O2 at2.5L (study 08-05-2009)   Allergies: Imitrex and Sulfa antibiotics Current Outpatient Prescriptions on File Prior to Visit  Medication Sig Dispense Refill  . Azelastine HCl 0.15 % SOLN Use 1 to 2 sprays in each  nostril once daily 90 mL 0  . cephALEXin (KEFLEX) 500 MG capsule Take 1 capsule (500 mg total) by mouth 4 (four) times daily. 40 capsule 0  . cetirizine (ZYRTEC) 10 MG tablet Take 10 mg by mouth every morning.     . cholecalciferol (VITAMIN D) 1000 UNITS tablet Take 1,000 Units by mouth every morning.     . clorazepate (TRANXENE) 7.5 MG tablet Take 1 tablet (7.5 mg total) by mouth 3 (three) times daily. 90 tablet 0  . fluticasone (FLONASE) 50 MCG/ACT nasal spray 1-2 puffs each nostril once daily (Patient taking differently: Place 2 sprays into both nostrils every morning. 1-2 puffs each nostril once daily) 48 g 3  . fluticasone (FLOVENT HFA) 44 MCG/ACT inhaler Inhale 2 puffs into the lungs 2 (two) times daily.    [name] Kitchen  folic acid (FOLVITE) A999333 MCG tablet Take 400 mcg by mouth every morning.    . furosemide (LASIX) 40 MG tablet Take 1 tablet by mouth  daily 90 tablet 0  . HYDROcodone-homatropine (HYCODAN) 5-1.5 MG/5ML syrup Take 5 mLs by mouth every 6 (six) hours as needed for cough. 180 mL 0  . ipratropium-albuterol (DUONEB) 0.5-2.5 (3) MG/3ML SOLN Take 3 mLs by nebulization 2 (two) times daily. 180 mL 5  . magic mouthwash SOLN Swish and swallow 5 ml 4 times daily if needed (Patient taking differently: Take 5 mLs by mouth 4 (four) times daily as needed for mouth pain (sore throat). ) 200 mL 2  . methocarbamol (ROBAXIN) 500 MG tablet Take 1 tablet (500 mg total) by mouth every 6 (six) hours as needed for muscle spasms. 40 tablet 1  . metoprolol succinate  (TOPROL-XL) 25 MG 24 hr tablet Take 1 tablet by mouth  daily before breakfast 90 tablet 0  . Multiple Vitamin (MULTIVITAMIN) tablet Take 1 tablet by mouth every morning.     . Multiple Vitamins-Minerals (PROTEGRA PO) Take 1 tablet by mouth every morning.    . pantoprazole (PROTONIX) 40 MG tablet Take 1 tablet (40 mg total) by mouth 2 (two) times daily. 180 tablet 3  . Polyethyl Glycol-Propyl Glycol (SYSTANE) 0.4-0.3 % SOLN Place 2 drops into both eyes 4 (four) times daily.     . potassium chloride (K-DUR) 10 MEQ tablet Take 1 tablet (10 mEq total) by mouth daily. 90 tablet 3  . pravastatin (PRAVACHOL) 40 MG tablet Take 1 tablet by mouth at  bedtime 90 tablet 0  . sennosides-docusate sodium (SENOKOT-S) 8.6-50 MG tablet Take 2 tablets by mouth at bedtime.     . traMADol (ULTRAM) 50 MG tablet Take 1-2 tablets (50-100 mg total) by mouth every 6 (six) hours as needed for moderate pain. 60 tablet 0  . XARELTO 20 MG TABS tablet Take 1 tablet by mouth  daily (Patient taking differently: Take 1 tablet by mouth  every evening at 5 pm.) 90 tablet 3   No current facility-administered medications on file prior to visit.    Social History  Substance Use Topics  . Smoking status: Never Smoker   . Smokeless tobacco: Never Used     Comment: NEVER USED TOBACCO  . Alcohol Use: No    Review of Systems  Constitutional: Negative for fever and chills.  HENT: Positive for congestion, postnasal drip and sore throat.   Respiratory: Positive for cough and wheezing ('once in while'). Negative for shortness of breath.   Cardiovascular: Negative for chest pain, palpitations and leg swelling.  Gastrointestinal: Negative for nausea and vomiting.      Objective:    BP 138/80 mmHg  Pulse 98  Temp(Src) 98.2 F (36.8 C) (Oral)  Ht 5' 4.5" (1.638 m)  Wt 191 lb 12 oz (86.977 kg)  BMI 32.42 kg/m2  SpO2 93%   Physical Exam  Constitutional: She appears well-developed and well-nourished.  HENT:  Head:  Normocephalic and atraumatic.  Right Ear: Hearing, tympanic membrane, external ear and ear canal normal. No drainage, swelling or tenderness. No foreign bodies. Tympanic membrane is not erythematous and not bulging. No middle ear effusion. No decreased hearing is noted.  Left Ear: Hearing, tympanic membrane, external ear and ear canal normal. No drainage, swelling or tenderness. No foreign bodies. Tympanic membrane is not erythematous and not bulging.  No middle ear effusion. No decreased hearing is noted.  Nose: Rhinorrhea present. Right sinus exhibits no  maxillary sinus tenderness and no frontal sinus tenderness. Left sinus exhibits no maxillary sinus tenderness and no frontal sinus tenderness.  Mouth/Throat: Uvula is midline and mucous membranes are normal. Posterior oropharyngeal erythema present. No oropharyngeal exudate, posterior oropharyngeal edema or tonsillar abscesses.  Eyes: Conjunctivae are normal.  Cardiovascular: Regular rhythm, normal heart sounds and normal pulses.   Pulmonary/Chest: Effort normal and breath sounds normal. She has no wheezes. She has no rhonchi. She has no rales.  Lymphadenopathy:       Head (right side): No submental, no submandibular, no tonsillar, no preauricular, no posterior auricular and no occipital adenopathy present.       Head (left side): No submental, no submandibular, no tonsillar, no preauricular, no posterior auricular and no occipital adenopathy present.    She has no cervical adenopathy.  Neurological: She is alert.  Skin: Skin is warm and dry.  Psychiatric: She has a normal mood and affect. Her speech is normal and behavior is normal. Thought content normal.  Vitals reviewed.      Assessment & Plan:   1. Cough Working diagnosis of cough is nonspecific at this time. Afebrile. No acute respiratory distress. SaO2 is 93% ( last in office visit 95%) so she is close to her baseline. I suspect the etiology is multifactorial including chronic  bronchitis, viral, allergies, and GERD. I discussed all of this with patient and family member in the room at length. No signs or symptoms of bacterial etiology at this time. Patient and I agreed on conservative treatment at this time with Tessalon Perles and Mucinex. I strongly advised her to stop taking the Hycodan cough syrup while she is on tramadol and patient understood my concern.  If patient does not improve on conservative therapy, we discussed a prednisone taper and possibly imaging as next step.   - benzonatate (TESSALON PERLES) 100 MG capsule; Take 1 capsule (100 mg total) by mouth 3 (three) times daily as needed for cough.  Dispense: 30 capsule; Refill: 1   I have discontinued Ms. Bonnell's oxyCODONE and predniSONE. I am also having her maintain her sennosides-docusate sodium, Polyethyl Glycol-Propyl Glycol, cetirizine, ipratropium-albuterol, cholecalciferol, multivitamin, fluticasone, folic acid, fluticasone, pantoprazole, XARELTO, magic mouthwash, furosemide, metoprolol succinate, pravastatin, clorazepate, Multiple Vitamins-Minerals (PROTEGRA PO), traMADol, methocarbamol, potassium chloride, Azelastine HCl, cephALEXin, HYDROcodone-homatropine, and potassium chloride.   Meds ordered this encounter  Medications  . potassium chloride (K-DUR,KLOR-CON) 10 MEQ tablet    Sig:      Start medications as prescribed and explained to patient on After Visit Summary ( AVS). Risks, benefits, and alternatives of the medications and treatment plan prescribed today were discussed, and patient expressed understanding.   Education regarding symptom management and diagnosis given to patient.   Follow-up:Plan follow-up as discussed or as needed if any worsening symptoms or change in condition. No Follow-up on file.   Continue to follow with Cathlean Cower, MD for routine health maintenance.   [name] [name] and I agreed with plan.   Mable Paris, FNP  Total of 25 minutes spent with patient,  greater than 50% of which was spent in discussion of multiple causes for cough.

## 2016-04-24 NOTE — Patient Instructions (Addendum)
Stop hycodan cough syrup while on tramadol.   Mucinex - PLAIN.   Increase intake of clear fluids. Congestion is best treated by hydration, when mucus is wetter, it is thinner, less sticky, and easier to expel from the body, either through coughing up drainage, or by blowing your nose.   Get plenty of rest.   Use saline nasal drops and blow your nose frequently. Run a humidifier at night and elevate the head of the bed. Vicks Vapor rub will help with congestion and cough. Steam showers and sinus massage for congestion.   Use Acetaminophen or Ibuprofen as needed for fever or pain. Avoid second hand smoke. Even the smallest exposure will worsen symptoms.   Over the counter medications you can try include Delsym for cough, a decongestant for congestion, and Mucinex or Robitussin as an expectorant. Be sure to just get the plain Mucinex or Robitussin that just has one medication (Guaifenesen). We don't recommend the combination products. Note, be sure to drink two glasses of water with each dose of Mucinex as the medication will not work well without adequate hydration.   You can also try a teaspoon of honey to see if this will help reduce cough. Throat lozenges can sometimes be beneficial as well.    This illness will typically last 7 - 10 days.   Please follow up with our clinic if you develop a fever greater than 101 F, symptoms worsen, or do not resolve in the next week.

## 2016-04-24 NOTE — Progress Notes (Signed)
Pre visit review using our clinic review tool, if applicable. No additional management support is needed unless otherwise documented below in the visit note. 

## 2016-04-27 ENCOUNTER — Encounter: Payer: Self-pay | Admitting: Cardiology

## 2016-05-03 ENCOUNTER — Ambulatory Visit: Payer: Medicare Other | Admitting: Internal Medicine

## 2016-05-09 ENCOUNTER — Ambulatory Visit (INDEPENDENT_AMBULATORY_CARE_PROVIDER_SITE_OTHER): Payer: Medicare Other | Admitting: Internal Medicine

## 2016-05-09 ENCOUNTER — Encounter: Payer: Self-pay | Admitting: Internal Medicine

## 2016-05-09 VITALS — BP 140/78 | HR 93 | Temp 98.7°F | Resp 20 | Wt 186.0 lb

## 2016-05-09 DIAGNOSIS — I10 Essential (primary) hypertension: Secondary | ICD-10-CM | POA: Diagnosis not present

## 2016-05-09 DIAGNOSIS — I5042 Chronic combined systolic (congestive) and diastolic (congestive) heart failure: Secondary | ICD-10-CM

## 2016-05-09 DIAGNOSIS — M7989 Other specified soft tissue disorders: Secondary | ICD-10-CM | POA: Diagnosis not present

## 2016-05-09 DIAGNOSIS — R197 Diarrhea, unspecified: Secondary | ICD-10-CM

## 2016-05-09 NOTE — Patient Instructions (Addendum)
.  Please continue all other medications as before, and refills have been done if requested.  Please have the pharmacy call with any other refills you may need.  Please continue your efforts at being more active, low cholesterol diet, and weight control  Please keep your appointments with your specialists as you may have planned - Dr Wynelle Link later today  You will be contacted regarding the referral for: Right leg venous doppler to check for blood clots asap (to see [facility] now)  We can hold on lab work today

## 2016-05-09 NOTE — Progress Notes (Signed)
Subjective:    Patient ID: [name] [name], female    DOB: 1936-10-02, 80 y.o.   MRN: [id number]  HPI  Here to f/u with moderate to severe  right leg swelling and calf pain  Just back from [place] for grandson college graduation, back in town on Mya 17, overall some improved.  S/p right knee TKA on Apr 17.   Still doing right leg excercises, doing at home, does not like to go to PT.  Has appt later today with Dr Mare Ferrari.  Pt denies chest pain, increased sob or doe, wheezing, orthopnea, PND,  palpitations, dizziness or syncope.   Wt Readings from Last 3 Encounters:  05/09/16 186 lb (84.369 kg)  04/24/16 191 lb 12 oz (86.977 kg)  04/11/16 189 lb (85.73 kg)   Pt denies fever, wt loss, night sweats, loss of appetite, or other constitutional symptoms but does have night sweats for the past several months for unclear reasons. Did have some diarrhea for a few days with recent tx cephalexin for UTI now resolved but severe enough she does not want to take this again if possible.  Denies worsening reflux, abd pain, dysphagia, n/v, bowel change or blood.  Denies urinary symptoms such as dysuria, frequency, urgency, flank pain, hematuria or n/v, fever, chills. .[id number] Current Outpatient Prescriptions on File Prior to Visit  Medication Sig Dispense Refill  . Azelastine HCl 0.15 % SOLN Use 1 to 2 sprays in each  nostril once daily 90 mL 0  . cetirizine (ZYRTEC) 10 MG tablet Take 10 mg by mouth every morning.     . cholecalciferol (VITAMIN D) 1000 UNITS tablet Take 1,000 Units by mouth every morning.     . fluticasone (FLONASE) 50 MCG/ACT nasal spray 1-2 puffs each nostril once daily (Patient taking differently: Place 2 sprays into both nostrils every morning. 1-2 puffs each nostril once daily) 48 g 3  . fluticasone (FLOVENT HFA) 44 MCG/ACT inhaler Inhale 2 puffs into the lungs 2 (two) times daily.    . folic acid (FOLVITE) A999333 MCG tablet Take 400 mcg by mouth every morning.    . furosemide  (LASIX) 40 MG tablet Take 1 tablet by mouth  daily 90 tablet 0  . ipratropium-albuterol (DUONEB) 0.5-2.5 (3) MG/3ML SOLN Take 3 mLs by nebulization 2 (two) times daily. 180 mL 5  . magic mouthwash SOLN Swish and swallow 5 ml 4 times daily if needed (Patient taking differently: Take 5 mLs by mouth 4 (four) times daily as needed for mouth pain (sore throat). ) 200 mL 2  . methocarbamol (ROBAXIN) 500 MG tablet Take 1 tablet (500 mg total) by mouth every 6 (six) hours as needed for muscle spasms. 40 tablet 1  . metoprolol succinate (TOPROL-XL) 25 MG 24 hr tablet Take 1 tablet by mouth  daily before breakfast 90 tablet 0  . Multiple Vitamin (MULTIVITAMIN) tablet Take 1 tablet by mouth every morning.     . Multiple Vitamins-Minerals (PROTEGRA PO) Take 1 tablet by mouth every morning.    . pantoprazole (PROTONIX) 40 MG tablet Take 1 tablet (40 mg total) by mouth 2 (two) times daily. 180 tablet 3  . Polyethyl Glycol-Propyl Glycol (SYSTANE) 0.4-0.3 % SOLN Place 2 drops into both eyes 4 (four) times daily.     . potassium chloride (K-DUR) 10 MEQ tablet Take 1 tablet (10 mEq total) by mouth daily. 90 tablet 3  . potassium chloride (K-DUR,KLOR-CON) 10 MEQ tablet     . pravastatin (PRAVACHOL) 40  MG tablet Take 1 tablet by mouth at  bedtime 90 tablet 0  . sennosides-docusate sodium (SENOKOT-S) 8.6-50 MG tablet Take 2 tablets by mouth at bedtime.     . traMADol (ULTRAM) 50 MG tablet Take 1-2 tablets (50-100 mg total) by mouth every 6 (six) hours as needed for moderate pain. 60 tablet 0  . XARELTO 20 MG TABS tablet Take 1 tablet by mouth  daily (Patient taking differently: Take 1 tablet by mouth  every evening at 5 pm.) 90 tablet 3  . clorazepate (TRANXENE) 7.5 MG tablet Take 1 tablet (7.5 mg total) by mouth 3 (three) times daily. 90 tablet 0   No current facility-administered medications on file prior to visit.   Review of Systems  Constitutional: Negative for unusual diaphoresis or night sweats HENT: Negative  for ear swelling or discharge Eyes: Negative for worsening visual haziness  Respiratory: Negative for choking and stridor.   Gastrointestinal: Negative for distension or worsening eructation Genitourinary: Negative for retention or change in urine volume.  Musculoskeletal: Negative for other MSK pain or swelling Skin: Negative for color change and worsening wound Neurological: Negative for tremors and numbness other than noted  Psychiatric/Behavioral: Negative for decreased concentration or agitation other than above       Objective:   Physical Exam Wearing croc on right foot due to swelling, black shoe on left,  Walking with walker for now BP 140/78 mmHg  Pulse 93  Temp(Src) 98.7 F (37.1 C) (Oral)  Resp 20  Wt 186 lb (84.369 kg)  SpO2 91% VS noted,  Constitutional: Pt appears in no apparent distress HENT: Head: NCAT.  Right Ear: External ear normal.  Left Ear: External ear normal.  Eyes: . Pupils are equal, round, and reactive to light. Conjunctivae and EOM are normal Neck: Normal range of motion. Neck supple.  Cardiovascular: Normal rate and regular rhythm.   Pulmonary/Chest: Effort normal and breath sounds without rales or wheezing.  Abd:  Soft, NT, ND, + BS Neurological: Pt is alert. Not confused , motor grossly intact Skin: Skin is warm. No rash, 2-3+ RLE edema to knee, also LLE edema trace to 1+ to knees Psychiatric: Pt behavior is normal. No agitation.     Assessment & Plan:

## 2016-05-09 NOTE — Progress Notes (Signed)
Pre visit review using our clinic review tool, if applicable. No additional management support is needed unless otherwise documented below in the visit note. 

## 2016-05-10 ENCOUNTER — Ambulatory Visit (HOSPITAL_COMMUNITY)
Admission: RE | Admit: 2016-05-10 | Discharge: 2016-05-10 | Disposition: A | Discharge status: Home / Self Care | Payer: Medicare Other | Source: Ambulatory Visit | Attending: Internal Medicine | Admitting: Internal Medicine

## 2016-05-10 ENCOUNTER — Telehealth: Payer: Self-pay | Admitting: *Deleted

## 2016-05-10 DIAGNOSIS — M7989 Other specified soft tissue disorders: Secondary | ICD-10-CM | POA: Diagnosis not present

## 2016-05-10 DIAGNOSIS — K219 Gastro-esophageal reflux disease without esophagitis: Secondary | ICD-10-CM | POA: Insufficient documentation

## 2016-05-10 DIAGNOSIS — G4733 Obstructive sleep apnea (adult) (pediatric): Secondary | ICD-10-CM | POA: Diagnosis not present

## 2016-05-10 DIAGNOSIS — I5042 Chronic combined systolic (congestive) and diastolic (congestive) heart failure: Secondary | ICD-10-CM | POA: Diagnosis not present

## 2016-05-10 DIAGNOSIS — E785 Hyperlipidemia, unspecified: Secondary | ICD-10-CM | POA: Insufficient documentation

## 2016-05-10 DIAGNOSIS — R197 Diarrhea, unspecified: Secondary | ICD-10-CM | POA: Insufficient documentation

## 2016-05-10 DIAGNOSIS — I110 Hypertensive heart disease with heart failure: Secondary | ICD-10-CM | POA: Diagnosis not present

## 2016-05-10 MED ORDER — CLORAZEPATE DIPOTASSIUM 7.5 MG PO TABS: 7.5000 mg | ORAL_TABLET | Freq: Three times a day (TID) | ORAL | Status: DC

## 2016-05-10 NOTE — Telephone Encounter (Signed)
Done hardcopy to Corinne  

## 2016-05-10 NOTE — Telephone Encounter (Signed)
Faxed script back to [facility]...[name] [name]

## 2016-05-10 NOTE — Assessment & Plan Note (Signed)
Most c/w antibx assoc diarrhea, exam benign, symptoms improved,  to f/u any worsening symptoms or concerns

## 2016-05-10 NOTE — Telephone Encounter (Signed)
Receive fax request for Clorazepate 7.5. Pls advise on refill...[name] [name]

## 2016-05-10 NOTE — Assessment & Plan Note (Signed)
Possible minor to mild volume increase but will need to r/o DVT before change other meds for now

## 2016-05-10 NOTE — Assessment & Plan Note (Addendum)
New onset with calf tenderness, could be related to recent right knee surgury just weeks ago as well as some swelling attributable to diast CHF? But cannot r/o DVT as well, for stat RLE venous doppler (but pt refuses now as she has appt with ortho after this), will get asap  Note:  Total time for pt hx, exam, review of record with pt in the room, determination of diagnoses and plan for further eval and tx is > 40 min, with over 50% spent in coordination and counseling of patient

## 2016-05-10 NOTE — Assessment & Plan Note (Signed)
stable overall by history and exam, recent data reviewed with pt, and pt to continue medical treatment as before,  to f/u any worsening symptoms or concerns BP Readings from Last 3 Encounters:  05/09/16 140/78  04/24/16 138/80  04/11/16 136/78

## 2016-05-12 ENCOUNTER — Telehealth: Payer: Self-pay

## 2016-05-12 NOTE — Telephone Encounter (Signed)
Too soon, since last rx already done may 24

## 2016-05-12 NOTE — Telephone Encounter (Signed)
[facility] sent refill request for pt for clorazepate. Please advise

## 2016-06-05 ENCOUNTER — Encounter: Payer: Self-pay | Admitting: Gynecologic Oncology

## 2016-06-05 ENCOUNTER — Ambulatory Visit: Payer: Medicare Other | Attending: Gynecologic Oncology | Admitting: Gynecologic Oncology

## 2016-06-05 VITALS — BP 155/72 | HR 105 | Temp 98.9°F | Resp 18 | Wt 183.0 lb

## 2016-06-05 DIAGNOSIS — Z7901 Long term (current) use of anticoagulants: Secondary | ICD-10-CM | POA: Diagnosis not present

## 2016-06-05 DIAGNOSIS — Z800 Family history of malignant neoplasm of digestive organs: Secondary | ICD-10-CM | POA: Insufficient documentation

## 2016-06-05 DIAGNOSIS — D6859 Other primary thrombophilia: Secondary | ICD-10-CM | POA: Insufficient documentation

## 2016-06-05 DIAGNOSIS — Z90710 Acquired absence of both cervix and uterus: Secondary | ICD-10-CM | POA: Diagnosis not present

## 2016-06-05 DIAGNOSIS — I447 Left bundle-branch block, unspecified: Secondary | ICD-10-CM | POA: Diagnosis not present

## 2016-06-05 DIAGNOSIS — Z950 Presence of cardiac pacemaker: Secondary | ICD-10-CM | POA: Insufficient documentation

## 2016-06-05 DIAGNOSIS — Z86008 Personal history of in-situ neoplasm of other site: Secondary | ICD-10-CM | POA: Diagnosis not present

## 2016-06-05 DIAGNOSIS — Z9049 Acquired absence of other specified parts of digestive tract: Secondary | ICD-10-CM | POA: Diagnosis not present

## 2016-06-05 DIAGNOSIS — Z888 Allergy status to other drugs, medicaments and biological substances status: Secondary | ICD-10-CM | POA: Insufficient documentation

## 2016-06-05 DIAGNOSIS — E785 Hyperlipidemia, unspecified: Secondary | ICD-10-CM | POA: Diagnosis not present

## 2016-06-05 DIAGNOSIS — I2510 Atherosclerotic heart disease of native coronary artery without angina pectoris: Secondary | ICD-10-CM | POA: Insufficient documentation

## 2016-06-05 DIAGNOSIS — D071 Carcinoma in situ of vulva: Secondary | ICD-10-CM | POA: Diagnosis present

## 2016-06-05 DIAGNOSIS — Z9889 Other specified postprocedural states: Secondary | ICD-10-CM | POA: Diagnosis not present

## 2016-06-05 DIAGNOSIS — I5042 Chronic combined systolic (congestive) and diastolic (congestive) heart failure: Secondary | ICD-10-CM | POA: Insufficient documentation

## 2016-06-05 DIAGNOSIS — Z79899 Other long term (current) drug therapy: Secondary | ICD-10-CM | POA: Insufficient documentation

## 2016-06-05 DIAGNOSIS — G4733 Obstructive sleep apnea (adult) (pediatric): Secondary | ICD-10-CM | POA: Insufficient documentation

## 2016-06-05 DIAGNOSIS — Z882 Allergy status to sulfonamides status: Secondary | ICD-10-CM | POA: Diagnosis not present

## 2016-06-05 DIAGNOSIS — Z803 Family history of malignant neoplasm of breast: Secondary | ICD-10-CM | POA: Diagnosis not present

## 2016-06-05 DIAGNOSIS — I110 Hypertensive heart disease with heart failure: Secondary | ICD-10-CM | POA: Diagnosis not present

## 2016-06-05 DIAGNOSIS — N289 Disorder of kidney and ureter, unspecified: Secondary | ICD-10-CM | POA: Diagnosis not present

## 2016-06-05 DIAGNOSIS — Z881 Allergy status to other antibiotic agents status: Secondary | ICD-10-CM | POA: Insufficient documentation

## 2016-06-05 DIAGNOSIS — K219 Gastro-esophageal reflux disease without esophagitis: Secondary | ICD-10-CM | POA: Insufficient documentation

## 2016-06-05 DIAGNOSIS — Z96651 Presence of right artificial knee joint: Secondary | ICD-10-CM | POA: Diagnosis not present

## 2016-06-05 NOTE — Patient Instructions (Addendum)
Follow up with Dr Benjie Karvonen in December 2017 and plan to follow up with Dr [name] next June 2018 . Please call with any changes , questions or concerns.  Thank you !

## 2016-06-05 NOTE — Progress Notes (Signed)
POSTOP FOLLOWUP VISIT  HPI:  [name] [name] is a 80 y.o. year old initially seen in consultation on 02/10/15, referred by Dr Benjie Karvonen, for [facility].  She then underwent a partial simple posterior vulvectomy and distal vaginectomy on 0000000 without complications.  Her postoperative course was uncomplicated.  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  She is seen today for an ongoing surveillance.  She is feeling overall well. She denies any vulvar pruritis or irritation. She denies noting any new vulvar lesions. She has no discharge or bleeding. She recently underwent a right knee replacement and is doing well from that standpoint.  Current Outpatient Prescriptions on File Prior to Visit  Medication Sig Dispense Refill  . Azelastine HCl 0.15 % SOLN Use 1 to 2 sprays in each  nostril once daily 90 mL 0  . cetirizine (ZYRTEC) 10 MG tablet Take 10 mg by mouth every morning.     . cholecalciferol (VITAMIN D) 1000 UNITS tablet Take 1,000 Units by mouth every morning.     . clorazepate (TRANXENE) 7.5 MG tablet Take 1 tablet (7.5 mg total) by mouth 3 (three) times daily. 90 tablet 5  . fluticasone (FLONASE) 50 MCG/ACT nasal spray 1-2 puffs each nostril once daily (Patient taking differently: Place 2 sprays into both nostrils every morning. 1-2 puffs each nostril once daily) 48 g 3  . fluticasone (FLOVENT HFA) 44 MCG/ACT inhaler Inhale 2 puffs into the lungs 2 (two) times daily.    . folic acid (FOLVITE) A999333 MCG tablet Take 400 mcg by mouth every morning.    . furosemide (LASIX) 40 MG tablet Take 1 tablet by mouth  daily 90 tablet 0  . ipratropium-albuterol (DUONEB) 0.5-2.5 (3) MG/3ML SOLN Take 3 mLs by nebulization 2 (two) times daily. 180 mL 5  . magic mouthwash SOLN Swish and swallow 5 ml 4 times daily if needed (Patient taking differently: Take 5 mLs by mouth 4 (four) times daily as needed for mouth pain (sore throat). ) 200 mL 2  . methocarbamol (ROBAXIN) 500 MG tablet  Take 1 tablet (500 mg total) by mouth every 6 (six) hours as needed for muscle spasms. 40 tablet 1  . metoprolol succinate (TOPROL-XL) 25 MG 24 hr tablet Take 1 tablet by mouth  daily before breakfast 90 tablet 0  . Multiple Vitamin (MULTIVITAMIN) tablet Take 1 tablet by mouth every morning.     . Multiple Vitamins-Minerals (PROTEGRA PO) Take 1 tablet by mouth every morning.    . pantoprazole (PROTONIX) 40 MG tablet Take 1 tablet (40 mg total) by mouth 2 (two) times daily. 180 tablet 3  . Polyethyl Glycol-Propyl Glycol (SYSTANE) 0.4-0.3 % SOLN Place 2 drops into both eyes 4 (four) times daily.     . potassium chloride (K-DUR) 10 MEQ tablet Take 1 tablet (10 mEq total) by mouth daily. 90 tablet 3  . potassium chloride (K-DUR,KLOR-CON) 10 MEQ tablet     . pravastatin (PRAVACHOL) 40 MG tablet Take 1 tablet by mouth at  bedtime 90 tablet 0  . sennosides-docusate sodium (SENOKOT-S) 8.6-50 MG tablet Take 2 tablets by mouth at bedtime.     . traMADol (ULTRAM) 50 MG tablet Take 1-2 tablets (50-100 mg total) by mouth every 6 (six) hours as needed for moderate pain. 60 tablet 0  . XARELTO 20 MG TABS tablet Take 1 tablet by mouth  daily (Patient taking differently: Take 1 tablet by mouth  every evening at 5 pm.) 90 tablet 3  No current facility-administered medications on file prior to visit.   Allergies  Allergen Reactions  . Cephalexin Other (See Comments)    diarrhea  . Imitrex [Sumatriptan] Other (See Comments)    Reaction unknown  . Sulfa Antibiotics Nausea Only    Doesn't like to drink lots of water.   Past Medical History  Diagnosis Date  . Allergic rhinitis   . Sarcoidosis ([place])   . Cardiac pacemaker in situ     DOI  08-04-2008  medtronic dual chamber  . Hyperlipidemia   . Shortness of breath     OCCASIONAL  . GERD (gastroesophageal reflux disease)   . History of pulmonary embolus (PE)   . Hypertension   . History of CVA (cerebrovascular accident)     09-15-2005  Left Cerebellar  Hematoma with mild stem compression whild on coumadin for DVT  . Venous insufficiency   . History of DVT of lower extremity   . Anxiety   . Renal insufficiency   . VIN III (vulvar intraepithelial neoplasia III)   . Coronary artery disease CARDIOLOGIST-- DR Cristopher Peru    per cath-- Non-obstructive minimal CAD  . Chronic combined systolic and diastolic CHF, NYHA class 2 ([place])     montior by cardiologist-- dr taylor--  ef 45%  . History of non-ST elevation myocardial infarction (NSTEMI)     2011-- in setting of bilateral PE's and DVT's and CHF  . PAF (paroxysmal atrial fibrillation) ([place])   . Mild pulmonary hypertension ([place])   . History of cervical dysplasia     CIN III  . Thrombophilia ([place])     hemotologist--  dr Marin Olp--  monitors pt anitcoagulation  . Chronic anticoagulation   . History of peptic ulcer disease   . OA (osteoarthritis)   . Moderate intermittent asthma   . LBBB (left bundle branch block)   . Urgency of urination   . Incontinence of urine   . Fluid retention   . Mild obstructive sleep apnea     PT USES Humidied Nasal Mask with O2 at2.5L (study 08-05-2009)   Past Surgical History  Procedure Laterality Date  . Lumbar disc surgery  x2   1990  . Vulvar lesion removal  07/16/2012    Procedure: VULVAR LESION;  Surgeon: Imagene Gurney A. Alycia Rossetti, MD;  Location: [facility];  Service: Gynecology;  Laterality: Right;  Wide Local Excision of the Vulvar  . Total knee arthroplasty Left 04/21/2013    Procedure: LEFT TOTAL KNEE ARTHROPLASTY;  Surgeon: Gearlean Alf, MD;  Location: [facility];  Service: Orthopedics;  Laterality: Left;  . Cardiac pacemaker placement  08-04-2008  dr taylor gregg    for CHB--  MEDTRONIC DUAL CHAMBER  . Cholecystectomy  01/1996  . Excision left supraclavicular lymph node   04-22-2007    granulomatous inflammation, extensive  . Insertion of vena cava filter  08/ 2011  . Shoulder arthroscopy with rotator cuff repair and subacromial decompression Right 02-08-2009   . Deccompression ulnar nerve at cubital tunnel and resection muscle Left 03-17-2008  . Transthoracic echocardiogram  last one 05-19-2014    mild LVH/  ef 45-50%/  septal-lateral dyssynchroncy consistant with LBBB/  diffuse hypokinesis/  grade I diastolic dysfunction/  trivial MR and TR/  mild LAE  . Cardiac catheterization  08-26-2003   dr Lorenz Coaster. non-obstructive CAD & LVD with ef 46%, global HK, 2+MR, PA press 50/24 w/ wedge 20  . Cardiac catheterization  08-03-2008   dr Angelena Form  Non-obstructive CAD/  30-40% pLAD,  30% ostial CFX/  symptomatic bradycardia/  normal renal arteries  . Abdominal hysterectomy  1970's  . Appendectomy  1966  . Vulvectomy N/A 02/19/2015    Procedure: SIMPLE PARTIAL VULVECTOMY;  Surgeon: [name], MD;  Location[facility];  Service: Gynecology;  Laterality: N/A;  . Cataracts removed    . Knee arthroscopy      LEFT  . Tubal ligation    . Total knee arthroplasty Right 04/03/2016    Procedure: RIGHT TOTAL KNEE ARTHROPLASTY;  Surgeon: Gaynelle Arabian, MD;  Location: [facility];  Service: Orthopedics;  Laterality: Right;   Family History  Problem Relation Age of Onset  . Heart attack Father   . Breast cancer Sister     mets  . Heart attack Brother   . Kidney failure Sister   . Colon cancer Neg Hx   . Esophageal cancer Neg Hx   . Pancreatic cancer Neg Hx   . Liver disease Neg Hx    Social History   Social History  . Marital Status: Widowed    Spouse Name: N/A  . Number of Children: 4  . Years of Education: N/A   Occupational History  . retired    Social History Main Topics  . Smoking status: Never Smoker   . Smokeless tobacco: Never Used     Comment: NEVER USED TOBACCO  . Alcohol Use: No  . Drug Use: No  . Sexual Activity: Not on file   Other Topics Concern  . Not on file   Social History Narrative     Review of systems: Constitutional:  She has no weight gain or weight loss. She has no fever or chills. Eyes: No  blurred vision Ears, Nose, Mouth, Throat: No dizziness, headaches or changes in hearing. No mouth sores. Cardiovascular: No chest pain, palpitations or edema. Respiratory:  No shortness of breath, wheezing or cough Gastrointestinal: She has normal bowel movements without diarrhea or constipation. She denies any nausea or vomiting. She denies blood in her stool or heart burn. Genitourinary:  She denies pelvic pain, pelvic pressure or changes in her urinary function. She has no hematuria, dysuria, or incontinence. She has no irregular vaginal bleeding or vaginal discharge Musculoskeletal: Denies muscle weakness or joint pains.  Skin:  She has no skin changes, rashes or itching Neurological:  Denies dizziness or headaches. No neuropathy, no numbness or tingling. Psychiatric:  She denies depression or anxiety. Hematologic/Lymphatic:   No easy bruising or bleeding   Physical Exam: Blood pressure 155/72, pulse 105, temperature 98.9 F (37.2 C), temperature source Oral, resp. rate 18, weight 183 lb (83.008 kg), SpO2 97 %. General: Well dressed, well nourished in no apparent distress.   HEENT:  Wearing protective glasses Skin:  No lesions or rashes. Breasts: deferred. Lungs: deferred Cardiovascular: deferred Abdomen:  deferred  Genitourinary: vulvo-vaginal incision healed well, intact. No drainage or suppuration. No infection. With application of 3% acetic acid there is no evidence of dysplasia.  Extremities: No cyanosis, clubbing or edema.  No calf tenderness or erythema. No palpable cords. Psychiatric: Mood and affect are appropriate. Neurological: Awake, alert and oriented x 3. Sensation is intact, no neuropathy.  Musculoskeletal: No pain, normal strength and range of motion.  Assessment:    80 y.o. year old with VIN III.   S/p distal posterior vaginectomy and partial simple posterior vulvectomy on 02/19/15. The vaginal margin was focally microscopically positive. Free of disease on today's  exam.  Plan: 1)  Continue 6 monthly surveillance exams with Dr Benjie Karvonen and in 12 months with myself. 2) Treatment counseling - discussed the high risk for recurrence with VIN III particularly with a positive margin. Recommend surveillance checks with acetic acid application to vulva every 6 months. Recommend patient self inspection for new lesions with mirror every month. She was given the opportunity to ask questions, which were answered to her satisfaction, and she is agreement with the above mentioned plan of care.  Donaciano Eva, MD

## 2016-06-06 ENCOUNTER — Telehealth: Payer: Self-pay | Admitting: Cardiology

## 2016-06-06 ENCOUNTER — Ambulatory Visit (INDEPENDENT_AMBULATORY_CARE_PROVIDER_SITE_OTHER): Payer: Medicare Other | Admitting: *Deleted

## 2016-06-06 DIAGNOSIS — Z950 Presence of cardiac pacemaker: Secondary | ICD-10-CM

## 2016-06-06 DIAGNOSIS — I442 Atrioventricular block, complete: Secondary | ICD-10-CM | POA: Diagnosis not present

## 2016-06-06 NOTE — Telephone Encounter (Signed)
Spoke with pt and reminded pt of remote transmission that is due today. Pt verbalized understanding.   

## 2016-06-07 LAB — CUP PACEART REMOTE DEVICE CHECK
Battery Impedance: 3212 Ohm
Battery Remaining Longevity: 17 mo
Battery Voltage: 2.73 V
Brady Statistic AP VS Percent: 0 %
Brady Statistic AS VP Percent: 59 %
Date Time Interrogation Session: 20170620191038
Implantable Lead Implant Date: 20090818
Implantable Lead Model: 5076
Lead Channel Impedance Value: 397 Ohm
Lead Channel Impedance Value: 538 Ohm
Lead Channel Pacing Threshold Pulse Width: 0.4 ms
Lead Channel Sensing Intrinsic Amplitude: 2.8 mV
Lead Channel Sensing Intrinsic Amplitude: 8 mV
Lead Channel Setting Pacing Amplitude: 2.5 V
MDC IDC LEAD IMPLANT DT: 20090818
MDC IDC LEAD LOCATION: 753859
MDC IDC LEAD LOCATION: 753860
MDC IDC MSMT LEADCHNL RA PACING THRESHOLD AMPLITUDE: 0.5 V
MDC IDC MSMT LEADCHNL RV PACING THRESHOLD AMPLITUDE: 0.875 V
MDC IDC MSMT LEADCHNL RV PACING THRESHOLD PULSEWIDTH: 0.4 ms
MDC IDC SET LEADCHNL RA PACING AMPLITUDE: 2 V
MDC IDC SET LEADCHNL RV PACING PULSEWIDTH: 0.4 ms
MDC IDC SET LEADCHNL RV SENSING SENSITIVITY: 4 mV
MDC IDC STAT BRADY AP VP PERCENT: 13 %
MDC IDC STAT BRADY AS VS PERCENT: 28 %

## 2016-06-07 NOTE — Progress Notes (Signed)
Remote pacemaker transmission.   

## 2016-06-08 ENCOUNTER — Encounter: Payer: Self-pay | Admitting: Internal Medicine

## 2016-06-08 ENCOUNTER — Ambulatory Visit (INDEPENDENT_AMBULATORY_CARE_PROVIDER_SITE_OTHER): Payer: Medicare Other | Admitting: Internal Medicine

## 2016-06-08 VITALS — BP 124/76 | HR 94 | Ht 64.0 in | Wt 182.0 lb

## 2016-06-08 DIAGNOSIS — I2782 Chronic pulmonary embolism: Secondary | ICD-10-CM

## 2016-06-08 DIAGNOSIS — I749 Embolism and thrombosis of unspecified artery: Secondary | ICD-10-CM | POA: Diagnosis not present

## 2016-06-08 DIAGNOSIS — I272 Other secondary pulmonary hypertension: Secondary | ICD-10-CM

## 2016-06-08 NOTE — Patient Instructions (Signed)
Order- referral to Dr Aundra Dubin cardiology- question significant pulmonary chronic thromboembolism

## 2016-06-08 NOTE — Progress Notes (Signed)
06/09/14- 21 yof never smoker referred courtesy of Dr Jenny Reichmann for allergy evaluation.  Hx OSA- Dr Gwenette Greet. Dr Lenna Gilford has seen for pulmonary- hx sarcoid, DVT/PE, CAD/ Pacemaker, asthmatic bronchitis, allergic rhinitis, cancer cervix Patient complains of sinus congestion and morning rhinorrhea x30 years or more. Triggers include spring and fall pollens, weather changes. Blames drainage for hoarseness and sneezing. Wears oxygen 2.5 L at night with humidifier on her oxygen. Skin testing 10 years ago with allergy shots reported helpful x2 years. Now in the past 3-5 years she has had more problems with nasal pressure, congestion, sneezing and drainage. Chest tightness and dyspnea managed with nebulizer twice daily, Flovent 44. She says she has not been told she has "asthma". We briefly reviewed her complicated medical history. No ENT surgery. Family history-allergies, asthma, heart disease, clotting disorders. She lives alone, retired from office work. Does not recognize significant exposures in her home.  07/29/14- 78 yoF never smoker followed for allergic rhinitis, hx sarcoid, DVT/PE, CAD/ Pacemaker, Hx OSA- Dr Gwenette Greet.  hx sarcoid, DVT/PE, CAD/ Pacemaker, Wears oxygen 2.5 L at night with humidifier on her oxygen/ nasal mask Weather change blamed for hacking cough. Nasal O2  2.5/ L/ [employer]/ sleep-at night dries nose, made worse by Dymista. Liked how Dymista stopped post nasal drip and reduced cough. Magic mouthwash helps throat irritation and thus helps cough.  Allergy profile 06/09/14- Total IgE 3.4- negative for specific or total IgE elevations against common environmental allergens.  09/30/14-77 yoF never smoker followed for allergic rhinitis, hx sarcoid, DVT/PE, CAD/ Pacemaker, Hx OSA- Dr Gwenette Greet.  hx sarcoid, DVT/PE, CAD/ Pacemaker, Wears oxygen 2.5 L at night with humidifier on her oxygen/ nasal mask FOLLOW FOR: allergies, dry cough, SOB, Back pain today  11/16/14- 77 yoF never smoker followed for  allergic rhinitis, hx sarcoid, DVT/PE, CAD/ Pacemaker,Hx OSA- Dr Gwenette Greet. Wears oxygen 2.5 L/ [employer] at night with humidifier on her oxygen/ nasal mask ACUTE VISIT: cough, chest tightness in left side of chest(almost like a pulled muscle), SOB and wheezing. Denies any fevers. Has been around son that is sick as well. Caught cold from son. Sore L parasternal area, radiating through to scapula. Tender to touch. Not at pacemaker site.No fever or green.  03/31/15- 78 yoF never smoker followed for allergic rhinitis, hx sarcoid, DVT/PE, CAD/ Pacemaker,Hx OSA- Dr Gwenette Greet.   oxygen 2.5 L/ [employer] at night FOLLOWS FOR: Itchy, watery eyes, cough and congestion at this time. Dr Katy Fitch Opth treating recurrent styes. Blames pollen rhinitis for sneezing treated with Flonase and azelastine nasal sprays. Continues home oxygen 2.5 L for sleep  12/09/2015-80 year old female never smoker followed for allergic rhinitis, history sarcoid, DVT/PE, CAD/pacemaker, history OSA O2 2.5 L/[employer] for sleep FOLLOWS FOR:  Still using 2.5 liters O2 at night; seems to be working fine, sleeps well. Pt states that she has been treated with 2 rounds  of Prednisone for recent allergy/asthma flare- completed last Prednisone in October 2016. CXR 10/05/2015 IMPRESSION: No active cardiopulmonary disease. Electronically Signed  By: Lahoma Crocker M.D.   06/08/2016-80 year old female never smoker followed for allergic rhinitis, sarcoid, DVT/PE/ CTE/ IVC filter, complicated by CAD/pacemaker, history OSA O2 2.5 L/[employer] for sleep FOLLOWS FOR: DME [employer] for O2 QHS-wears nightly. Pt states she gives out quickly after exertion. ECHO 09/10/15- PAs 33 mmHG She tolerated right TKR 9 weeks ago without significant respiratory problems but with postoperative fever she had a CT chest which we reviewed. It suggested possibility of chronic thromboembolism syndrome which we discussed for possible relationship to her  exertional dyspnea. An ultrasound of  her legs on 05/10/2016, after the surgery, reported negative for DVT. She describes having to lie down to rest, to pace herself, 2 or 3 times during the day so she can get through her housework. No cough or wheeze. Remains on Xarelto without bleeding. Office spirometry 2015-within normal CT chest 04/09/2016 IMPRESSION: 1. Web-like synechiae within the proximal RIGHT lower lobe pulmonary artery is most consistent with remodeling of a remote pulmonary embolism rather than acute pulmonary embolism. No tubular occlusive filling defects to suggest acute pulmonary embolism. 2. Mild atelectasis the RIGHT lung base. 3. IVC filter in infrarenal location. 4. Venous collaterals in the anterior abdominal wall and chest wall is suggestive of chronic IVC occlusion. Electronically Signed  By: Suzy Bouchard M.D.  On: 04/09/2016 15:17  ROS-see HPI Constitutional:   No-   weight loss, night sweats, fevers, chills, fatigue, lassitude. HEENT:   No-  headaches, difficulty swallowing, tooth/dental problems, sore throat,      + sneezing, itching, ear ache, +nasal congestion, +post nasal drip,  CV:  +chest pain, No-orthopnea, PND, swelling in lower extremities, anasarca,                                                    dizziness, palpitations Resp: + shortness of breath with exertion or at rest.              No-   productive cough,  + non-productive cough,  No- coughing up of blood.              No-   change in color of mucus.  + wheezing.   Skin: No-   rash or lesions. GI:  No-   heartburn, indigestion, abdominal pain, nausea, vomiting,  GU:  MS:  No-   joint pain or swelling.  . Neuro-     nothing unusual Psych:  No- change in mood or affect. No depression or anxiety.  No memory loss.  OBJ- Physical Exam General- Alert, Oriented, Affect-appropriate, Distress- none acute Skin- rash-none, lesions- none, excoriation- none Lymphadenopathy- none Head- atraumatic            Eyes- Gross vision  intact, PERRLA, conjunctivae and secretions clear,             Ears- Hearing, canals-normal            Nose- Clear, +Septal dev/ Narrow on the left, no-mucus, polyps, erosion, perforation             Throat- Mallampati II , mucosa clear , drainage- none, tonsils- atrophic Neck- flexible , trachea midline, no stridor , thyroid nl, carotid no bruit Chest - symmetrical excursion , unlabored           Heart/CV- RRR , no murmur , no gallop  , no rub, nl s1 s2                           - JVD- none , edema- none, stasis changes- none, varices- none           Lung- clear to P&A, wheeze- none, cough-+mild , dullness-none, rub- none           Chest wall- +L pacer,  Abd-  Br/ Gen/ Rectal- Not done, not indicated Extrem- cyanosis- none, clubbing, none, atrophy- none,  strength- nl, + support stockings Neuro- grossly intact to observation

## 2016-06-09 ENCOUNTER — Encounter: Payer: Self-pay | Admitting: Cardiology

## 2016-06-09 NOTE — Assessment & Plan Note (Signed)
She feels activity is significantly limited by exertional dyspnea with normal office spirometry and echocardiogram showing PAs 33. CT chest 04/09/2016 shows evidence of residual from previous pulmonary embolism as well as venous collaterals consistent with chronic IVC occlusion. Respecting her age, I'm not sure she would be a candidate for intervention but we discussed cardiology referral with this in mind.

## 2016-06-09 NOTE — Assessment & Plan Note (Signed)
Probably at least a component of chronic thromboembolism. She does not have COPD. Plan-continue oxygen for sleep at 2 L

## 2016-06-18 ENCOUNTER — Other Ambulatory Visit: Payer: Self-pay | Admitting: Internal Medicine

## 2016-06-18 ENCOUNTER — Other Ambulatory Visit: Payer: Self-pay | Admitting: Hematology & Oncology

## 2016-06-23 ENCOUNTER — Encounter: Payer: Self-pay | Admitting: Cardiology

## 2016-07-17 ENCOUNTER — Encounter (HOSPITAL_COMMUNITY): Payer: Self-pay

## 2016-07-17 ENCOUNTER — Ambulatory Visit (HOSPITAL_COMMUNITY)
Admission: RE | Admit: 2016-07-17 | Discharge: 2016-07-17 | Disposition: A | Discharge status: Home / Self Care | Payer: Medicare Other | Source: Ambulatory Visit | Attending: Cardiology | Admitting: Cardiology

## 2016-07-17 ENCOUNTER — Other Ambulatory Visit (HOSPITAL_COMMUNITY): Payer: Self-pay

## 2016-07-17 VITALS — BP 138/84 | HR 81 | Wt 184.5 lb

## 2016-07-17 DIAGNOSIS — Z8249 Family history of ischemic heart disease and other diseases of the circulatory system: Secondary | ICD-10-CM | POA: Diagnosis not present

## 2016-07-17 DIAGNOSIS — I5042 Chronic combined systolic (congestive) and diastolic (congestive) heart failure: Secondary | ICD-10-CM

## 2016-07-17 DIAGNOSIS — Z8541 Personal history of malignant neoplasm of cervix uteri: Secondary | ICD-10-CM | POA: Diagnosis not present

## 2016-07-17 DIAGNOSIS — Z841 Family history of disorders of kidney and ureter: Secondary | ICD-10-CM | POA: Insufficient documentation

## 2016-07-17 DIAGNOSIS — G4733 Obstructive sleep apnea (adult) (pediatric): Secondary | ICD-10-CM | POA: Diagnosis not present

## 2016-07-17 DIAGNOSIS — I2510 Atherosclerotic heart disease of native coronary artery without angina pectoris: Secondary | ICD-10-CM | POA: Insufficient documentation

## 2016-07-17 DIAGNOSIS — I110 Hypertensive heart disease with heart failure: Secondary | ICD-10-CM | POA: Diagnosis not present

## 2016-07-17 DIAGNOSIS — Z7901 Long term (current) use of anticoagulants: Secondary | ICD-10-CM | POA: Insufficient documentation

## 2016-07-17 DIAGNOSIS — I480 Paroxysmal atrial fibrillation: Secondary | ICD-10-CM | POA: Insufficient documentation

## 2016-07-17 DIAGNOSIS — Z79899 Other long term (current) drug therapy: Secondary | ICD-10-CM | POA: Insufficient documentation

## 2016-07-17 DIAGNOSIS — I272 Other secondary pulmonary hypertension: Secondary | ICD-10-CM

## 2016-07-17 DIAGNOSIS — D869 Sarcoidosis, unspecified: Secondary | ICD-10-CM | POA: Diagnosis not present

## 2016-07-17 DIAGNOSIS — Z86711 Personal history of pulmonary embolism: Secondary | ICD-10-CM | POA: Diagnosis not present

## 2016-07-17 DIAGNOSIS — Z950 Presence of cardiac pacemaker: Secondary | ICD-10-CM | POA: Diagnosis not present

## 2016-07-17 DIAGNOSIS — I442 Atrioventricular block, complete: Secondary | ICD-10-CM | POA: Insufficient documentation

## 2016-07-17 DIAGNOSIS — I5032 Chronic diastolic (congestive) heart failure: Secondary | ICD-10-CM | POA: Insufficient documentation

## 2016-07-17 DIAGNOSIS — E785 Hyperlipidemia, unspecified: Secondary | ICD-10-CM | POA: Diagnosis not present

## 2016-07-17 DIAGNOSIS — Z8673 Personal history of transient ischemic attack (TIA), and cerebral infarction without residual deficits: Secondary | ICD-10-CM | POA: Insufficient documentation

## 2016-07-17 DIAGNOSIS — R0600 Dyspnea, unspecified: Secondary | ICD-10-CM

## 2016-07-17 DIAGNOSIS — Z86718 Personal history of other venous thrombosis and embolism: Secondary | ICD-10-CM | POA: Insufficient documentation

## 2016-07-17 LAB — COMPREHENSIVE METABOLIC PANEL
ALBUMIN: 4.1 g/dL (ref 3.5–5.0)
ALK PHOS: 57 U/L (ref 38–126)
ALT: 14 U/L (ref 14–54)
ANION GAP: 7 (ref 5–15)
AST: 26 U/L (ref 15–41)
BUN: 14 mg/dL (ref 6–20)
CO2: 26 mmol/L (ref 22–32)
Calcium: 9.3 mg/dL (ref 8.9–10.3)
Chloride: 105 mmol/L (ref 101–111)
Creatinine, Ser: 0.9 mg/dL (ref 0.44–1.00)
GFR calc Af Amer: >60 mL/min (ref >60)
GFR calc non Af Amer: >60 mL/min (ref >60)
GLUCOSE: 112 mg/dL — AB (ref 65–99)
POTASSIUM: 3.7 mmol/L (ref 3.5–5.1)
SODIUM: 138 mmol/L (ref 135–145)
Total Bilirubin: 0.8 mg/dL (ref 0.3–1.2)
Total Protein: 8 g/dL (ref 6.5–8.1)

## 2016-07-17 LAB — CBC
HEMATOCRIT: 40.5 % (ref 36.0–46.0)
HEMOGLOBIN: 12.4 g/dL (ref 12.0–15.0)
MCH: 27.8 pg (ref 26.0–34.0)
MCHC: 30.6 g/dL (ref 30.0–36.0)
MCV: 90.8 fL (ref 78.0–100.0)
Platelets: 185 10*3/uL (ref 150–400)
RBC: 4.46 MIL/uL (ref 3.87–5.11)
RDW: 14.9 % (ref 11.5–15.5)
WBC: 6.3 10*3/uL (ref 4.0–10.5)

## 2016-07-17 LAB — PROTIME-INR
INR: 1.43
PROTHROMBIN TIME: 17.6 s — AB (ref 11.4–15.2)

## 2016-07-17 LAB — BRAIN NATRIURETIC PEPTIDE: B NATRIURETIC PEPTIDE 5: 100.3 pg/mL — AB (ref 0.0–100.0)

## 2016-07-17 MED ORDER — FUROSEMIDE 40 MG PO TABS: ORAL_TABLET | ORAL | 3 refills | Status: DC

## 2016-07-17 MED ORDER — POTASSIUM CHLORIDE ER 10 MEQ PO TBCR: 20.0000 meq | EXTENDED_RELEASE_TABLET | Freq: Every day | ORAL | 3 refills | Status: DC

## 2016-07-17 NOTE — Progress Notes (Signed)
Patient ID: [name] [name], female   DOB: 06/28/1936, 80 y.o.   MRN: [id number] PCP: Dr. Jenny Reichmann Pulmonology: Dr. Annamaria Boots EP: Dr. Lovena Le HF cardiology: Dr. Aundra Dubin  80 yo with h/o DVT/PE in 2011, inactive sarcoidosis, LBBB, and paroxysmal atrial fibrillation and atrial tachycardia presents for evaluation of possible chronic thromboembolic pulmonary hypertension.  She had an IVC filter placed back in 4/17 when she had her PE.  Her last echo in 9/16 showed normal PA pressure and EF 50-55%.  However, she had a CT chest in 4/17 that was concerning for the sequelae of chronic thromboemboli.    She lives alone.  She had recent right TKR.  She gets short of breath with exercises for her knee.  She is short of breath after walking about 1/2 block.  She uses a cane for balance.  No dyspnea walking around her house.  No chest pain.  She has mild orthopnea, raises head of bed about 6 inches.  Rare palpitations => occasional atrial tachycardia runs on last device interrogation (short).  No lightheadedness/syncope.  Ankles have been swelling.   ECG (4/17): sinus tachy 100 bpm, LBBB, QRS 167 msec  Labs (4/17): LDL 66, K 3.7, creatinine 0.88  PMH: 1. Right TKR 4/17.  2. DVT/PE in 2011: Had IVC filter placed.  - CT chest (4/17): Possible chronic PE => web-like synechiae within right lower lobe pulmonary artery.  - Echo (9/16): EF 50-55%, mild LVH, PA systolic pressure 33 mmHg.  3. Sarcoidosis: Not active.  4. Cervical cancer 5. Allergic rhinitis 6. Complete heart block: Intermittent. Has Medtronic PPM.  7. LBBB 8. OSA 9. HTN 10. Hyperlipidemia 11. Vulvar neoplasm s/p surgery.  12. CVA 9/16 13. Atrial fibrillation: Paroxysmal. 14. CAD: LHC 9/04 with mild nonobstructive disease.  15. Atrial tachycardia: Paroxysmal.   SH: Retired, lives alone.  Nonsmoker.  No ETOH.   Family History  Problem Relation Age of Onset  . Heart attack Father   . Breast cancer Sister     mets  . Heart attack Brother   .  Kidney failure Sister   . Colon cancer Neg Hx   . Esophageal cancer Neg Hx   . Pancreatic cancer Neg Hx   . Liver disease Neg Hx    ROS: All systems reviewed and negative except as per HPI.   Current Outpatient Prescriptions  Medication Sig Dispense Refill  . Azelastine HCl 0.15 % SOLN Use 1 to 2 sprays in each  nostril once daily 90 mL 0  . cetirizine (ZYRTEC) 10 MG tablet Take 10 mg by mouth every morning.     . cholecalciferol (VITAMIN D) 1000 UNITS tablet Take 1,000 Units by mouth every morning.     . clorazepate (TRANXENE) 7.5 MG tablet Take 1 tablet (7.5 mg total) by mouth 3 (three) times daily. 90 tablet 5  . fluticasone (FLONASE) 50 MCG/ACT nasal spray 1-2 puffs each nostril once daily 48 g 3  . fluticasone (FLOVENT HFA) 44 MCG/ACT inhaler Inhale 2 puffs into the lungs 2 (two) times daily.    . folic acid (FOLVITE) A999333 MCG tablet Take 400 mcg by mouth every morning.    . furosemide (LASIX) 40 MG tablet Take 40 mg (2 tabs) in am and 20 mg (1 tab) in pm 90 tablet 3  . ipratropium-albuterol (DUONEB) 0.5-2.5 (3) MG/3ML SOLN Take 3 mLs by nebulization 2 (two) times daily. 180 mL 5  . magic mouthwash SOLN Swish and swallow 5 ml 4 times daily if needed 200  mL 2  . metoprolol succinate (TOPROL-XL) 25 MG 24 hr tablet Take 1 tablet by mouth  daily before breakfast 90 tablet 3  . Multiple Vitamin (MULTIVITAMIN) tablet Take 1 tablet by mouth every morning.     . Multiple Vitamins-Minerals (PROTEGRA PO) Take 1 tablet by mouth every morning.    . pantoprazole (PROTONIX) 40 MG tablet Take 1 tablet by mouth  twice a day 180 tablet 3  . Polyethyl Glycol-Propyl Glycol (SYSTANE) 0.4-0.3 % SOLN Place 2 drops into both eyes 4 (four) times daily.     . potassium chloride (K-DUR) 10 MEQ tablet Take 2 tablets (20 mEq total) by mouth daily. 90 tablet 3  . pravastatin (PRAVACHOL) 40 MG tablet Take 1 tablet by mouth at  bedtime 90 tablet 3  . sennosides-docusate sodium (SENOKOT-S) 8.6-50 MG tablet Take 2  tablets by mouth at bedtime.     Alveda Reasons 20 MG TABS tablet Take 1 tablet by mouth  daily 90 tablet 3  . acetaminophen (TYLENOL) 500 MG tablet Take 500 mg by mouth every 6 (six) hours as needed for mild pain.    . methocarbamol (ROBAXIN) 500 MG tablet Take 1 tablet (500 mg total) by mouth every 6 (six) hours as needed for muscle spasms. (Patient not taking: Reported on 07/17/2016) 40 tablet 1  . traMADol (ULTRAM) 50 MG tablet Take 1-2 tablets (50-100 mg total) by mouth every 6 (six) hours as needed for moderate pain. (Patient not taking: Reported on 07/17/2016) 60 tablet 0   No current facility-administered medications for this encounter.    BP 138/84   Pulse 81   Wt 184 lb 8 oz (83.7 kg)   SpO2 99%   BMI 31.67 kg/m  General: NAD Neck: No JVD, no thyromegaly or thyroid nodule.  Lungs: Clear to auscultation bilaterally with normal respiratory effort. CV: Nondisplaced PMI.  Heart regular S1/S2, paradoxically split S2, no S3/S4, no murmur.  1+ edema 3/4 to knees bilaterally, bilateral ankle varicosities.  No carotid bruit.  Normal pedal pulses.  Abdomen: Soft, nontender, no hepatosplenomegaly, no distention.  Skin: Intact without lesions or rashes.  Neurologic: Alert and oriented x 3.  Psych: Normal affect. Extremities: No clubbing or cyanosis.  HEENT: Normal.   Assessment/Plan: 1. ?Chronic thromboembolic pulmonary hypertension: Patient has NYHA class III exertional dyspnea.  CT chest in 4/17 was concerning for chronic thromboembolic disease.  Last echo was about a year ago and did not show significant PA pressure elevation.   - She needs a repeat echo to assess RV.  - I think she should have RHC for gold standard assessment for pulmonary hypertension.  We discussed this today and I went over the risks/benefits.  She agrees to have the procedure done.  If she has significant pulmonary arterial hypertension from chronic thromboemboli, she may benefit from riociguat.  Could think about  definitive surgery in that case but might be too high risk for her.  2. Chronic diastolic CHF: Suspect there is a component of RV failure.  Probably mild volume overload.  NYHA class III.  - Increase Lasix to 40 qam/20 qpm.  Increase KCl to 20 daily.  BMET/BNP today and repeat in 2 wks.  - Wear compression stockings during the day.  3. Atrial fibrillation: Paroxysmal.  NSR today.  Hold Xarelto the day before and day of RHC.  4. OSA: CPAP.  5. H/o PE/DVT: Has IVC filter, on Xarelto.  6. Sarcoidosis: Seems to be quiescent.  7. Complete heart block: Intermittent.  Has Medtronic PPM.   Loralie Champagne 07/18/2016

## 2016-07-17 NOTE — Patient Instructions (Signed)
INCREASE Lasix to 40 mg (2 tabs) in am and 20 mg (1 tab) in pm.  INCREASE Potassium to 20 meq (1 tabs) once daily.  Routine lab work today. Will notify you of abnormal results, otherwise no news is good news!  You have been scheduled for a heart catheterization. See last attached sheet for additional instructions.  Follow up with Dr. Aundra Dubin in 3 weeks.  Do the following things EVERYDAY: 1) Weigh yourself in the morning before breakfast. Write it down and keep it in a log. 2) Take your medicines as prescribed 3) Eat low salt foods-Limit salt (sodium) to 2000 mg per day.  4) Stay as active as you can everyday 5) Limit all fluids for the day to less than 2 liters

## 2016-07-21 ENCOUNTER — Ambulatory Visit (HOSPITAL_COMMUNITY)
Admission: RE | Admit: 2016-07-21 | Discharge: 2016-07-21 | Disposition: A | Discharge status: Home / Self Care | Payer: Medicare Other | Source: Ambulatory Visit | Attending: Cardiology | Admitting: Cardiology

## 2016-07-21 ENCOUNTER — Encounter (HOSPITAL_COMMUNITY)
Admission: RE | Disposition: A | Discharge status: Home / Self Care | Payer: Self-pay | Source: Ambulatory Visit | Attending: Cardiology

## 2016-07-21 ENCOUNTER — Encounter (HOSPITAL_COMMUNITY): Payer: Self-pay | Admitting: *Deleted

## 2016-07-21 DIAGNOSIS — Z8541 Personal history of malignant neoplasm of cervix uteri: Secondary | ICD-10-CM | POA: Insufficient documentation

## 2016-07-21 DIAGNOSIS — Z8249 Family history of ischemic heart disease and other diseases of the circulatory system: Secondary | ICD-10-CM | POA: Diagnosis not present

## 2016-07-21 DIAGNOSIS — E785 Hyperlipidemia, unspecified: Secondary | ICD-10-CM | POA: Insufficient documentation

## 2016-07-21 DIAGNOSIS — R0600 Dyspnea, unspecified: Secondary | ICD-10-CM

## 2016-07-21 DIAGNOSIS — I110 Hypertensive heart disease with heart failure: Secondary | ICD-10-CM | POA: Insufficient documentation

## 2016-07-21 DIAGNOSIS — I480 Paroxysmal atrial fibrillation: Secondary | ICD-10-CM | POA: Diagnosis not present

## 2016-07-21 DIAGNOSIS — Z86718 Personal history of other venous thrombosis and embolism: Secondary | ICD-10-CM | POA: Insufficient documentation

## 2016-07-21 DIAGNOSIS — Z7901 Long term (current) use of anticoagulants: Secondary | ICD-10-CM | POA: Diagnosis not present

## 2016-07-21 DIAGNOSIS — Z86711 Personal history of pulmonary embolism: Secondary | ICD-10-CM | POA: Insufficient documentation

## 2016-07-21 DIAGNOSIS — I5032 Chronic diastolic (congestive) heart failure: Secondary | ICD-10-CM | POA: Diagnosis not present

## 2016-07-21 DIAGNOSIS — I2510 Atherosclerotic heart disease of native coronary artery without angina pectoris: Secondary | ICD-10-CM | POA: Insufficient documentation

## 2016-07-21 DIAGNOSIS — Z8673 Personal history of transient ischemic attack (TIA), and cerebral infarction without residual deficits: Secondary | ICD-10-CM | POA: Insufficient documentation

## 2016-07-21 DIAGNOSIS — D869 Sarcoidosis, unspecified: Secondary | ICD-10-CM | POA: Insufficient documentation

## 2016-07-21 DIAGNOSIS — Z803 Family history of malignant neoplasm of breast: Secondary | ICD-10-CM | POA: Insufficient documentation

## 2016-07-21 DIAGNOSIS — R0609 Other forms of dyspnea: Secondary | ICD-10-CM | POA: Diagnosis not present

## 2016-07-21 DIAGNOSIS — J309 Allergic rhinitis, unspecified: Secondary | ICD-10-CM | POA: Insufficient documentation

## 2016-07-21 DIAGNOSIS — G4733 Obstructive sleep apnea (adult) (pediatric): Secondary | ICD-10-CM | POA: Insufficient documentation

## 2016-07-21 DIAGNOSIS — I447 Left bundle-branch block, unspecified: Secondary | ICD-10-CM | POA: Insufficient documentation

## 2016-07-21 DIAGNOSIS — I442 Atrioventricular block, complete: Secondary | ICD-10-CM | POA: Diagnosis not present

## 2016-07-21 DIAGNOSIS — I272 Other secondary pulmonary hypertension: Secondary | ICD-10-CM | POA: Diagnosis not present

## 2016-07-21 HISTORY — PX: CARDIAC CATHETERIZATION: SHX172

## 2016-07-21 LAB — POCT I-STAT 3, VENOUS BLOOD GAS (G3P V)
ACID-BASE EXCESS: 2 mmol/L (ref 0.0–2.0)
ACID-BASE EXCESS: 3 mmol/L — AB (ref 0.0–2.0)
Bicarbonate: 26.6 mEq/L — ABNORMAL HIGH (ref 20.0–24.0)
Bicarbonate: 28.3 mEq/L — ABNORMAL HIGH (ref 20.0–24.0)
O2 SAT: 66 %
O2 Saturation: 67 %
PH VEN: 7.419 — AB (ref 7.250–7.300)
PH VEN: 7.422 — AB (ref 7.250–7.300)
TCO2: 28 mmol/L (ref 0–100)
TCO2: 30 mmol/L (ref 0–100)
pCO2, Ven: 41 mmHg — ABNORMAL LOW (ref 45.0–50.0)
pCO2, Ven: 43.4 mmHg — ABNORMAL LOW (ref 45.0–50.0)
pO2, Ven: 34 mmHg (ref 31.0–45.0)
pO2, Ven: 34 mmHg (ref 31.0–45.0)

## 2016-07-21 LAB — BASIC METABOLIC PANEL
Anion gap: 8 (ref 5–15)
BUN: 10 mg/dL (ref 6–20)
CO2: 28 mmol/L (ref 22–32)
CREATININE: 0.89 mg/dL (ref 0.44–1.00)
Calcium: 9.3 mg/dL (ref 8.9–10.3)
Chloride: 104 mmol/L (ref 101–111)
GFR calc Af Amer: >60 mL/min (ref >60)
Glucose, Bld: 99 mg/dL (ref 65–99)
Potassium: 3.6 mmol/L (ref 3.5–5.1)
SODIUM: 140 mmol/L (ref 135–145)

## 2016-07-21 SURGERY — RIGHT HEART CATH
Anesthesia: LOCAL

## 2016-07-21 MED ORDER — ACETAMINOPHEN 325 MG PO TABS: 650.0000 mg | ORAL_TABLET | ORAL | Status: DC | PRN

## 2016-07-21 MED ORDER — SODIUM CHLORIDE 0.9 % IV SOLN: 250.0000 mL | INTRAVENOUS | Status: DC | PRN

## 2016-07-21 MED ORDER — HEPARIN (PORCINE) IN NACL 2-0.9 UNIT/ML-% IJ SOLN
INTRAMUSCULAR | Status: AC
  Filled 2016-07-21: qty 1000

## 2016-07-21 MED ORDER — FENTANYL CITRATE (PF) 100 MCG/2ML IJ SOLN
INTRAMUSCULAR | Status: AC
  Filled 2016-07-21: qty 2

## 2016-07-21 MED ORDER — SODIUM CHLORIDE 0.9% FLUSH: 3.0000 mL | Freq: Two times a day (BID) | INTRAVENOUS | Status: DC

## 2016-07-21 MED ORDER — ONDANSETRON HCL 4 MG/2ML IJ SOLN: 4.0000 mg | Freq: Four times a day (QID) | INTRAMUSCULAR | Status: DC | PRN

## 2016-07-21 MED ORDER — FENTANYL CITRATE (PF) 100 MCG/2ML IJ SOLN
INTRAMUSCULAR | Status: DC | PRN
  Administered 2016-07-21: 12.5 ug via INTRAVENOUS

## 2016-07-21 MED ORDER — SODIUM CHLORIDE 0.9% FLUSH: 3.0000 mL | INTRAVENOUS | Status: DC | PRN

## 2016-07-21 MED ORDER — MIDAZOLAM HCL 2 MG/2ML IJ SOLN
INTRAMUSCULAR | Status: AC
  Filled 2016-07-21: qty 2

## 2016-07-21 MED ORDER — LIDOCAINE HCL (PF) 1 % IJ SOLN
INTRAMUSCULAR | Status: DC | PRN
  Administered 2016-07-21: 10:00:00

## 2016-07-21 MED ORDER — SODIUM CHLORIDE 0.9 % IV SOLN
INTRAVENOUS | Status: DC | PRN
  Administered 2016-07-21: 10 mL/h via INTRAVENOUS

## 2016-07-21 MED ORDER — LIDOCAINE HCL (PF) 1 % IJ SOLN
INTRAMUSCULAR | Status: AC
  Filled 2016-07-21: qty 30

## 2016-07-21 MED ORDER — SODIUM CHLORIDE 0.9 % IV SOLN: INTRAVENOUS | Status: DC

## 2016-07-21 MED ORDER — MIDAZOLAM HCL 2 MG/2ML IJ SOLN
INTRAMUSCULAR | Status: DC | PRN
  Administered 2016-07-21: 0.5 mg via INTRAVENOUS

## 2016-07-21 SURGICAL SUPPLY — 9 items
CATH BALLN WEDGE 5F 110CM (CATHETERS) ×2 IMPLANT
PACK CARDIAC CATHETERIZATION (CUSTOM PROCEDURE TRAY) ×2 IMPLANT
PROTECTION STATION PRESSURIZED (MISCELLANEOUS) ×2
SHEATH FAST CATH BRACH 5F 5CM (SHEATH) ×2 IMPLANT
STATION PROTECTION PRESSURIZED (MISCELLANEOUS) ×1 IMPLANT
TRANSDUCER W/STOPCOCK (MISCELLANEOUS) ×2 IMPLANT
TUBING ART PRESS 72  MALE/FEM (TUBING) ×1
TUBING ART PRESS 72 MALE/FEM (TUBING) ×1 IMPLANT
TUBING CIL FLEX 10 FLL-RA (TUBING) ×2 IMPLANT

## 2016-07-21 NOTE — Interval H&P Note (Signed)
History and Physical Interval Note:  07/21/2016 9:33 AM  [name] [name]  has presented today for surgery, with the diagnosis of Heart Failure  The various methods of treatment have been discussed with the patient and family. After consideration of risks, benefits and other options for treatment, the patient has consented to  Procedure(s): Right Heart Cath (N/A) as a surgical intervention .  The patient's history has been reviewed, patient examined, no change in status, stable for surgery.  I have reviewed the patient's chart and labs.  Questions were answered to the patient's satisfaction.     Ashland Osmer [employer]

## 2016-07-21 NOTE — H&P (View-Only) (Signed)
Patient ID: [name] [name], female   DOB: [birth year], 80 y.o.   MRN: [id number] PCP: Dr. Jenny Reichmann Pulmonology: Dr. Annamaria Boots EP: Dr. Lovena Le HF cardiology: Dr. Aundra Dubin  80 yo with h/o DVT/PE in 2011, inactive sarcoidosis, LBBB, and paroxysmal atrial fibrillation and atrial tachycardia presents for evaluation of possible chronic thromboembolic pulmonary hypertension.  She had an IVC filter placed back in 4/17 when she had her PE.  Her last echo in 9/16 showed normal PA pressure and EF 50-55%.  However, she had a CT chest in 4/17 that was concerning for the sequelae of chronic thromboemboli.    She lives alone.  She had recent right TKR.  She gets short of breath with exercises for her knee.  She is short of breath after walking about 1/2 block.  She uses a cane for balance.  No dyspnea walking around her house.  No chest pain.  She has mild orthopnea, raises head of bed about 6 inches.  Rare palpitations => occasional atrial tachycardia runs on last device interrogation (short).  No lightheadedness/syncope.  Ankles have been swelling.   ECG (4/17): sinus tachy 100 bpm, LBBB, QRS 167 msec  Labs (4/17): LDL 66, K 3.7, creatinine 0.88  PMH: 1. Right TKR 4/17.  2. DVT/PE in 2011: Had IVC filter placed.  - CT chest (4/17): Possible chronic PE => web-like synechiae within right lower lobe pulmonary artery.  - Echo (9/16): EF 50-55%, mild LVH, PA systolic pressure 33 mmHg.  3. Sarcoidosis: Not active.  4. Cervical cancer 5. Allergic rhinitis 6. Complete heart block: Intermittent. Has Medtronic PPM.  7. LBBB 8. OSA 9. HTN 10. Hyperlipidemia 11. Vulvar neoplasm s/p surgery.  12. CVA 9/16 13. Atrial fibrillation: Paroxysmal. 14. CAD: LHC 9/04 with mild nonobstructive disease.  15. Atrial tachycardia: Paroxysmal.   SH: Retired, lives alone.  Nonsmoker.  No ETOH.   Family History  Problem Relation Age of Onset  . Heart attack Father   . Breast cancer Sister     mets  . Heart attack Brother   .  Kidney failure Sister   . Colon cancer Neg Hx   . Esophageal cancer Neg Hx   . Pancreatic cancer Neg Hx   . Liver disease Neg Hx    ROS: All systems reviewed and negative except as per HPI.   Current Outpatient Prescriptions  Medication Sig Dispense Refill  . Azelastine HCl 0.15 % SOLN Use 1 to 2 sprays in each  nostril once daily 90 mL 0  . cetirizine (ZYRTEC) 10 MG tablet Take 10 mg by mouth every morning.     . cholecalciferol (VITAMIN D) 1000 UNITS tablet Take 1,000 Units by mouth every morning.     . clorazepate (TRANXENE) 7.5 MG tablet Take 1 tablet (7.5 mg total) by mouth 3 (three) times daily. 90 tablet 5  . fluticasone (FLONASE) 50 MCG/ACT nasal spray 1-2 puffs each nostril once daily 48 g 3  . fluticasone (FLOVENT HFA) 44 MCG/ACT inhaler Inhale 2 puffs into the lungs 2 (two) times daily.    . folic acid (FOLVITE) A999333 MCG tablet Take 400 mcg by mouth every morning.    . furosemide (LASIX) 40 MG tablet Take 40 mg (2 tabs) in am and 20 mg (1 tab) in pm 90 tablet 3  . ipratropium-albuterol (DUONEB) 0.5-2.5 (3) MG/3ML SOLN Take 3 mLs by nebulization 2 (two) times daily. 180 mL 5  . magic mouthwash SOLN Swish and swallow 5 ml 4 times daily if needed 200  mL 2  . metoprolol succinate (TOPROL-XL) 25 MG 24 hr tablet Take 1 tablet by mouth  daily before breakfast 90 tablet 3  . Multiple Vitamin (MULTIVITAMIN) tablet Take 1 tablet by mouth every morning.     . Multiple Vitamins-Minerals (PROTEGRA PO) Take 1 tablet by mouth every morning.    . pantoprazole (PROTONIX) 40 MG tablet Take 1 tablet by mouth  twice a day 180 tablet 3  . Polyethyl Glycol-Propyl Glycol (SYSTANE) 0.4-0.3 % SOLN Place 2 drops into both eyes 4 (four) times daily.     . potassium chloride (K-DUR) 10 MEQ tablet Take 2 tablets (20 mEq total) by mouth daily. 90 tablet 3  . pravastatin (PRAVACHOL) 40 MG tablet Take 1 tablet by mouth at  bedtime 90 tablet 3  . sennosides-docusate sodium (SENOKOT-S) 8.6-50 MG tablet Take 2  tablets by mouth at bedtime.     Alveda Reasons 20 MG TABS tablet Take 1 tablet by mouth  daily 90 tablet 3  . acetaminophen (TYLENOL) 500 MG tablet Take 500 mg by mouth every 6 (six) hours as needed for mild pain.    . methocarbamol (ROBAXIN) 500 MG tablet Take 1 tablet (500 mg total) by mouth every 6 (six) hours as needed for muscle spasms. (Patient not taking: Reported on 07/17/2016) 40 tablet 1  . traMADol (ULTRAM) 50 MG tablet Take 1-2 tablets (50-100 mg total) by mouth every 6 (six) hours as needed for moderate pain. (Patient not taking: Reported on 07/17/2016) 60 tablet 0   No current facility-administered medications for this encounter.    BP 138/84   Pulse 81   Wt 184 lb 8 oz (83.7 kg)   SpO2 99%   BMI 31.67 kg/m  General: NAD Neck: No JVD, no thyromegaly or thyroid nodule.  Lungs: Clear to auscultation bilaterally with normal respiratory effort. CV: Nondisplaced PMI.  Heart regular S1/S2, paradoxically split S2, no S3/S4, no murmur.  1+ edema 3/4 to knees bilaterally, bilateral ankle varicosities.  No carotid bruit.  Normal pedal pulses.  Abdomen: Soft, nontender, no hepatosplenomegaly, no distention.  Skin: Intact without lesions or rashes.  Neurologic: Alert and oriented x 3.  Psych: Normal affect. Extremities: No clubbing or cyanosis.  HEENT: Normal.   Assessment/Plan: 1. ?Chronic thromboembolic pulmonary hypertension: Patient has NYHA class III exertional dyspnea.  CT chest in 4/17 was concerning for chronic thromboembolic disease.  Last echo was about a year ago and did not show significant PA pressure elevation.   - She needs a repeat echo to assess RV.  - I think she should have RHC for gold standard assessment for pulmonary hypertension.  We discussed this today and I went over the risks/benefits.  She agrees to have the procedure done.  If she has significant pulmonary arterial hypertension from chronic thromboemboli, she may benefit from riociguat.  Could think about  definitive surgery in that case but might be too high risk for her.  2. Chronic diastolic CHF: Suspect there is a component of RV failure.  Probably mild volume overload.  NYHA class III.  - Increase Lasix to 40 qam/20 qpm.  Increase KCl to 20 daily.  BMET/BNP today and repeat in 2 wks.  - Wear compression stockings during the day.  3. Atrial fibrillation: Paroxysmal.  NSR today.  Hold Xarelto the day before and day of RHC.  4. OSA: CPAP.  5. H/o PE/DVT: Has IVC filter, on Xarelto.  6. Sarcoidosis: Seems to be quiescent.  7. Complete heart block: Intermittent.  Has Medtronic PPM.   Loralie Champagne 07/18/2016

## 2016-07-21 NOTE — Discharge Instructions (Signed)
Angiogram, Care After °Refer to this sheet in the next few weeks. These instructions provide you with information about caring for yourself after your procedure. Your health care provider may also give you more specific instructions. Your treatment has been planned according to current medical practices, but problems sometimes occur. Call your health care provider if you have any problems or questions after your procedure. °WHAT TO EXPECT AFTER THE PROCEDURE °After your procedure, it is typical to have the following: °· Bruising at the catheter insertion site that usually fades within 1-2 weeks. °· Blood collecting in the tissue (hematoma) that may be painful to the touch. It should usually decrease in size and tenderness within 1-2 weeks. °HOME CARE INSTRUCTIONS °· Take medicines only as directed by your health care provider. °· You may shower 24-48 hours after the procedure or as directed by your health care provider. Remove the bandage (dressing) and gently wash the site with plain soap and water. Pat the area dry with a clean towel. Do not rub the site, because this may cause bleeding. °· Do not take baths, swim, or use a hot tub until your health care provider approves. °· Check your insertion site every day for redness, swelling, or drainage. °· Do not apply powder or lotion to the site. °· Do not lift over 10 lb (4.5 kg) for 5 days after your procedure or as directed by your health care provider. °· Ask your health care provider when it is okay to: °¨ Return to work or school. °¨ Resume usual physical activities or sports. °¨ Resume sexual activity. °· Do not drive home if you are discharged the same day as the procedure. Have someone else drive you. °· You may drive 24 hours after the procedure unless otherwise instructed by your health care provider. °· Do not operate machinery or power tools for 24 hours after the procedure or as directed by your health care provider. °· If your procedure was done as an  outpatient procedure, which means that you went home the same day as your procedure, a responsible adult should be with you for the first 24 hours after you arrive home. °· Keep all follow-up visits as directed by your health care provider. This is important. °SEEK MEDICAL CARE IF: °· You have a fever. °· You have chills. °· You have increased bleeding from the catheter insertion site. Hold pressure on the site. °SEEK IMMEDIATE MEDICAL CARE IF: °· You have unusual pain at the catheter insertion site. °· You have redness, warmth, or swelling at the catheter insertion site. °· You have drainage (other than a small amount of blood on the dressing) from the catheter insertion site. °· The catheter insertion site is bleeding, and the bleeding does not stop after 30 minutes of holding steady pressure on the site. °· The area near or just beyond the catheter insertion site becomes pale, cool, tingly, or numb. °  °This information is not intended to replace advice given to you by your health care provider. Make sure you discuss any questions you have with your health care provider. °  °Document Released: 06/22/2005 Document Revised: 12/25/2014 Document Reviewed: 05/07/2013 °Elsevier Interactive Patient Education ©2016 [employer] ° °

## 2016-07-24 MED FILL — Heparin Sodium (Porcine) 2 Unit/ML in Sodium Chloride 0.9%: INTRAMUSCULAR | Qty: 500 | Status: AC

## 2016-08-08 ENCOUNTER — Other Ambulatory Visit (HOSPITAL_COMMUNITY): Payer: Medicare Other

## 2016-08-08 ENCOUNTER — Ambulatory Visit (HOSPITAL_COMMUNITY)
Admission: RE | Admit: 2016-08-08 | Discharge: 2016-08-08 | Disposition: A | Discharge status: Home / Self Care | Payer: Medicare Other | Source: Ambulatory Visit | Attending: Cardiology | Admitting: Cardiology

## 2016-08-08 DIAGNOSIS — G4733 Obstructive sleep apnea (adult) (pediatric): Secondary | ICD-10-CM | POA: Diagnosis not present

## 2016-08-08 DIAGNOSIS — J45909 Unspecified asthma, uncomplicated: Secondary | ICD-10-CM | POA: Insufficient documentation

## 2016-08-08 DIAGNOSIS — E785 Hyperlipidemia, unspecified: Secondary | ICD-10-CM | POA: Diagnosis not present

## 2016-08-08 DIAGNOSIS — I509 Heart failure, unspecified: Secondary | ICD-10-CM | POA: Diagnosis present

## 2016-08-08 DIAGNOSIS — I110 Hypertensive heart disease with heart failure: Secondary | ICD-10-CM | POA: Insufficient documentation

## 2016-08-08 DIAGNOSIS — I358 Other nonrheumatic aortic valve disorders: Secondary | ICD-10-CM | POA: Diagnosis not present

## 2016-08-08 DIAGNOSIS — I5042 Chronic combined systolic (congestive) and diastolic (congestive) heart failure: Secondary | ICD-10-CM | POA: Insufficient documentation

## 2016-08-08 DIAGNOSIS — K219 Gastro-esophageal reflux disease without esophagitis: Secondary | ICD-10-CM | POA: Diagnosis not present

## 2016-08-08 NOTE — Progress Notes (Signed)
*  PRELIMINARY RESULTS* Echocardiogram 2D Echocardiogram has been performed.  [name] [name] 08/08/2016, 11:35 AM

## 2016-08-14 ENCOUNTER — Ambulatory Visit (HOSPITAL_COMMUNITY)
Admission: RE | Admit: 2016-08-14 | Discharge: 2016-08-14 | Disposition: A | Discharge status: Home / Self Care | Payer: Medicare Other | Source: Ambulatory Visit | Attending: Cardiology | Admitting: Cardiology

## 2016-08-14 VITALS — BP 150/80 | HR 106 | Wt 182.0 lb

## 2016-08-14 DIAGNOSIS — Z79899 Other long term (current) drug therapy: Secondary | ICD-10-CM | POA: Insufficient documentation

## 2016-08-14 DIAGNOSIS — I5042 Chronic combined systolic (congestive) and diastolic (congestive) heart failure: Secondary | ICD-10-CM | POA: Diagnosis present

## 2016-08-14 DIAGNOSIS — Z8249 Family history of ischemic heart disease and other diseases of the circulatory system: Secondary | ICD-10-CM | POA: Diagnosis not present

## 2016-08-14 DIAGNOSIS — I5032 Chronic diastolic (congestive) heart failure: Secondary | ICD-10-CM | POA: Insufficient documentation

## 2016-08-14 DIAGNOSIS — E785 Hyperlipidemia, unspecified: Secondary | ICD-10-CM | POA: Insufficient documentation

## 2016-08-14 DIAGNOSIS — Z96651 Presence of right artificial knee joint: Secondary | ICD-10-CM | POA: Insufficient documentation

## 2016-08-14 DIAGNOSIS — I442 Atrioventricular block, complete: Secondary | ICD-10-CM | POA: Insufficient documentation

## 2016-08-14 DIAGNOSIS — Z86711 Personal history of pulmonary embolism: Secondary | ICD-10-CM | POA: Diagnosis not present

## 2016-08-14 DIAGNOSIS — Z841 Family history of disorders of kidney and ureter: Secondary | ICD-10-CM | POA: Diagnosis not present

## 2016-08-14 DIAGNOSIS — D869 Sarcoidosis, unspecified: Secondary | ICD-10-CM | POA: Diagnosis not present

## 2016-08-14 DIAGNOSIS — I480 Paroxysmal atrial fibrillation: Secondary | ICD-10-CM | POA: Diagnosis not present

## 2016-08-14 DIAGNOSIS — I272 Other secondary pulmonary hypertension: Secondary | ICD-10-CM

## 2016-08-14 DIAGNOSIS — Z950 Presence of cardiac pacemaker: Secondary | ICD-10-CM | POA: Insufficient documentation

## 2016-08-14 DIAGNOSIS — I110 Hypertensive heart disease with heart failure: Secondary | ICD-10-CM | POA: Diagnosis not present

## 2016-08-14 DIAGNOSIS — R0609 Other forms of dyspnea: Secondary | ICD-10-CM | POA: Diagnosis not present

## 2016-08-14 DIAGNOSIS — Z7901 Long term (current) use of anticoagulants: Secondary | ICD-10-CM | POA: Diagnosis not present

## 2016-08-14 DIAGNOSIS — Z86718 Personal history of other venous thrombosis and embolism: Secondary | ICD-10-CM | POA: Diagnosis not present

## 2016-08-14 DIAGNOSIS — Z8673 Personal history of transient ischemic attack (TIA), and cerebral infarction without residual deficits: Secondary | ICD-10-CM | POA: Diagnosis not present

## 2016-08-14 DIAGNOSIS — G4733 Obstructive sleep apnea (adult) (pediatric): Secondary | ICD-10-CM | POA: Diagnosis not present

## 2016-08-14 LAB — BASIC METABOLIC PANEL
ANION GAP: 7 (ref 5–15)
BUN: 15 mg/dL (ref 6–20)
CHLORIDE: 103 mmol/L (ref 101–111)
CO2: 29 mmol/L (ref 22–32)
Calcium: 9.3 mg/dL (ref 8.9–10.3)
Creatinine, Ser: 1.12 mg/dL — ABNORMAL HIGH (ref 0.44–1.00)
GFR, EST AFRICAN AMERICAN: 53 mL/min — AB (ref >60)
GFR, EST NON AFRICAN AMERICAN: 46 mL/min — AB (ref >60)
Glucose, Bld: 111 mg/dL — ABNORMAL HIGH (ref 65–99)
POTASSIUM: 4 mmol/L (ref 3.5–5.1)
SODIUM: 139 mmol/L (ref 135–145)

## 2016-08-14 LAB — BRAIN NATRIURETIC PEPTIDE: B NATRIURETIC PEPTIDE 5: 100.6 pg/mL — AB (ref 0.0–100.0)

## 2016-08-14 MED ORDER — LISINOPRIL 5 MG PO TABS: 5.0000 mg | ORAL_TABLET | Freq: Every day | ORAL | 3 refills | Status: DC

## 2016-08-14 NOTE — Patient Instructions (Signed)
Start Lisinopril 5 mg daily  Labs today  Labs in 2 weeks  We will contact you in 4 months to schedule your next appointment.

## 2016-08-15 NOTE — Progress Notes (Signed)
Patient ID: [name] [name], female   DOB: [birth year], 80 y.o.   MRN: [id number] PCP: Dr. Jenny Reichmann Pulmonology: Dr. Annamaria Boots EP: Dr. Lovena Le HF cardiology: Dr. Aundra Dubin  80 yo with h/o DVT/PE in 2011, inactive sarcoidosis, LBBB, and paroxysmal atrial fibrillation and atrial tachycardia presents for evaluation of possible chronic thromboembolic pulmonary hypertension.  She had an IVC filter placed back in 4/17 when she had her PE.  Her last echo in 8/17 showed EF 45-50%, RV normal size and systolic function.  However, she had a CT chest in 4/17 that was concerning for the sequelae of chronic thromboemboli.  RHC was done in 8/17 with mild pulmonary hypertension.  PVR was not significantly elevated at 2.5 WU.   She lives alone.  She had recent right TKR.  She is short of breath after walking about 1/2 block.  She uses a cane for balance.  No dyspnea walking around her house.  No chest pain.  She has mild orthopnea, raises head of bed about 6 inches.  Rare palpitations => occasional atrial tachycardia runs on last device interrogation (short).  No lightheadedness/syncope. Weight is down 2 lbs.  She will be moving to independent living soon.    Labs (4/17): LDL 66, K 3.7, creatinine 0.88 Labs (8/17): K 3.6, creatinine 0.89  PMH: 1. Right TKR 4/17.  2. DVT/PE in 2011: Had IVC filter placed.  - CT chest (4/17): Possible chronic PE => web-like synechiae within right lower lobe pulmonary artery.  - Echo (9/16): EF 50-55%, mild LVH, PA systolic pressure 33 mmHg.  - Echo (8/17): EF 45-50%, normal RV size and systolic function, PASP 32.  - RHC (8/17): mean RA 3, PA 38/13 mean 25, mean PCWP 10, CI 3.15, PVR 2.5 WU.  3. Sarcoidosis: Not active.  4. Cervical cancer 5. Allergic rhinitis 6. Complete heart block: Intermittent. Has Medtronic PPM.  7. LBBB 8. OSA 9. HTN 10. Hyperlipidemia 11. Vulvar neoplasm s/p surgery.  12. CVA 9/16 13. Atrial fibrillation: Paroxysmal. 14. CAD: LHC 9/04 with mild nonobstructive  disease.  15. Atrial tachycardia: Paroxysmal.   SH: Retired, lives alone.  Nonsmoker.  No ETOH.   Family History  Problem Relation Age of Onset  . Heart attack Father   . Breast cancer Sister     mets  . Heart attack Brother   . Kidney failure Sister   . Colon cancer Neg Hx   . Esophageal cancer Neg Hx   . Pancreatic cancer Neg Hx   . Liver disease Neg Hx    ROS: All systems reviewed and negative except as per HPI.   Current Outpatient Prescriptions  Medication Sig Dispense Refill  . acetaminophen (TYLENOL) 500 MG tablet Take 500 mg by mouth every 6 (six) hours as needed for mild pain.    . Azelastine HCl 0.15 % SOLN Use 1 to 2 sprays in each  nostril once daily 90 mL 0  . cetirizine (ZYRTEC) 10 MG tablet Take 10 mg by mouth every morning.     . cholecalciferol (VITAMIN D) 1000 UNITS tablet Take 1,000 Units by mouth every morning.     . clorazepate (TRANXENE) 7.5 MG tablet Take 1 tablet (7.5 mg total) by mouth 3 (three) times daily. 90 tablet 5  . fluticasone (FLONASE) 50 MCG/ACT nasal spray 1-2 puffs each nostril once daily 48 g 3  . fluticasone (FLOVENT HFA) 44 MCG/ACT inhaler Inhale 2 puffs into the lungs 2 (two) times daily.    . folic acid (FOLVITE) A999333 MCG  tablet Take 400 mcg by mouth every morning.    . furosemide (LASIX) 40 MG tablet Take 40 mg (2 tabs) in am and 20 mg (1 tab) in pm 90 tablet 3  . ipratropium-albuterol (DUONEB) 0.5-2.5 (3) MG/3ML SOLN Take 3 mLs by nebulization 2 (two) times daily. 180 mL 5  . magic mouthwash SOLN Swish and swallow 5 ml 4 times daily if needed 200 mL 2  . methocarbamol (ROBAXIN) 500 MG tablet Take 1 tablet (500 mg total) by mouth every 6 (six) hours as needed for muscle spasms. 40 tablet 1  . metoprolol succinate (TOPROL-XL) 25 MG 24 hr tablet Take 1 tablet by mouth  daily before breakfast 90 tablet 3  . Multiple Vitamin (MULTIVITAMIN) tablet Take 1 tablet by mouth every morning.     . Multiple Vitamins-Minerals (PROTEGRA PO) Take 1 tablet  by mouth every morning.    . pantoprazole (PROTONIX) 40 MG tablet Take 1 tablet by mouth  twice a day 180 tablet 3  . Polyethyl Glycol-Propyl Glycol (SYSTANE) 0.4-0.3 % SOLN Place 2 drops into both eyes 4 (four) times daily.     . potassium chloride (K-DUR) 10 MEQ tablet Take 2 tablets (20 mEq total) by mouth daily. 90 tablet 3  . pravastatin (PRAVACHOL) 40 MG tablet Take 1 tablet by mouth at  bedtime 90 tablet 3  . sennosides-docusate sodium (SENOKOT-S) 8.6-50 MG tablet Take 2 tablets by mouth at bedtime.     . traMADol (ULTRAM) 50 MG tablet Take 1-2 tablets (50-100 mg total) by mouth every 6 (six) hours as needed for moderate pain. 60 tablet 0  . XARELTO 20 MG TABS tablet Take 1 tablet by mouth  daily 90 tablet 3  . lisinopril (PRINIVIL,ZESTRIL) 5 MG tablet Take 1 tablet (5 mg total) by mouth daily. 30 tablet 3   No current facility-administered medications for this encounter.    BP (!) 150/80 (BP Location: Left Arm, Patient Position: Sitting, Cuff Size: Normal)   Pulse (!) 106   Wt 182 lb (82.6 kg)   SpO2 95%   BMI 31.24 kg/m  General: NAD Neck: No JVD, no thyromegaly or thyroid nodule.  Lungs: Clear to auscultation bilaterally with normal respiratory effort. CV: Nondisplaced PMI.  Heart regular S1/S2, paradoxically split S2, no S3/S4, no murmur.  No edema, bilateral ankle varicosities.  No carotid bruit.  Normal pedal pulses.  Abdomen: Soft, nontender, no hepatosplenomegaly, no distention.  Skin: Intact without lesions or rashes.  Neurologic: Alert and oriented x 3.  Psych: Normal affect. Extremities: No clubbing or cyanosis.  HEENT: Normal.   Assessment/Plan: 1. ?Chronic thromboembolic pulmonary hypertension: Patient has NYHA class III exertional dyspnea.  CT chest in 4/17 was concerning for chronic thromboembolic disease.  Echo in 8/17 showed normal RV systolic function.  [place] in 8/17 showed only mild pulmonary hypertension and PVR 2.5 WU, no indication for pulmonary vasodilator  treatment. 2. Chronic primarily diastolic CHF: Echo with EF 45-50% in 8/17, normal RV.  NYHA class II-III symtoms, volume status looks better on increased Lasix and weight is down.  - Continue current Lasix, check BMET/BNP today.  - Wear compression stockings during the day.  - Continue Toprol XL.  Given mildly decreased EF and elevated BP, add lisinopril 5 mg daily with BMET in 2 wks.  3. Atrial fibrillation: Paroxysmal.  NSR today.  Continue Xarelto.  4. OSA: CPAP.  5. H/o PE/DVT: Has IVC filter, on Xarelto.  6. Sarcoidosis: Seems to be quiescent.  7. Complete heart  block: Intermittent.  Has Medtronic PPM.   Followup in 4 months.   Loralie Champagne 08/15/2016

## 2016-08-25 ENCOUNTER — Encounter: Payer: Self-pay | Admitting: Internal Medicine

## 2016-08-28 ENCOUNTER — Ambulatory Visit (HOSPITAL_COMMUNITY)
Admission: RE | Admit: 2016-08-28 | Discharge: 2016-08-28 | Disposition: A | Discharge status: Home / Self Care | Payer: Medicare Other | Source: Ambulatory Visit | Attending: Cardiology | Admitting: Cardiology

## 2016-08-28 DIAGNOSIS — I5042 Chronic combined systolic (congestive) and diastolic (congestive) heart failure: Secondary | ICD-10-CM | POA: Diagnosis present

## 2016-08-28 DIAGNOSIS — I5023 Acute on chronic systolic (congestive) heart failure: Secondary | ICD-10-CM

## 2016-08-28 LAB — BASIC METABOLIC PANEL
Anion gap: 10 (ref 5–15)
BUN: 9 mg/dL (ref 6–20)
CALCIUM: 9.7 mg/dL (ref 8.9–10.3)
CO2: 27 mmol/L (ref 22–32)
CREATININE: 1.14 mg/dL — AB (ref 0.44–1.00)
Chloride: 104 mmol/L (ref 101–111)
GFR calc Af Amer: 52 mL/min — ABNORMAL LOW (ref >60)
GFR, EST NON AFRICAN AMERICAN: 45 mL/min — AB (ref >60)
Glucose, Bld: 103 mg/dL — ABNORMAL HIGH (ref 65–99)
Potassium: 4.5 mmol/L (ref 3.5–5.1)
SODIUM: 141 mmol/L (ref 135–145)

## 2016-09-06 ENCOUNTER — Encounter: Payer: Self-pay | Admitting: Internal Medicine

## 2016-09-12 ENCOUNTER — Telehealth (HOSPITAL_COMMUNITY): Payer: Self-pay | Admitting: *Deleted

## 2016-09-12 MED ORDER — FUROSEMIDE 20 MG PO TABS: 20.0000 mg | ORAL_TABLET | Freq: Every evening | ORAL | 3 refills | Status: DC

## 2016-09-12 MED ORDER — FUROSEMIDE 40 MG PO TABS: 40.0000 mg | ORAL_TABLET | ORAL | 3 refills | Status: DC

## 2016-09-12 NOTE — Telephone Encounter (Signed)
Pt called to ask about her lasix prescription, she states she is currently taking 40 mg in AM and 20 in PM and has a 40 mg tab which she has been cutting in half for the PM dose and she is requesting to have a 20 mg tab so she does not have to cut them.  Rx for 20 mg tabs sent in

## 2016-09-15 ENCOUNTER — Other Ambulatory Visit: Payer: Self-pay | Admitting: Internal Medicine

## 2016-09-15 ENCOUNTER — Telehealth: Payer: Self-pay

## 2016-09-15 MED ORDER — FLUTICASONE PROPIONATE HFA 44 MCG/ACT IN AERO: 2.0000 | INHALATION_SPRAY | Freq: Two times a day (BID) | RESPIRATORY_TRACT | 5 refills | Status: DC

## 2016-09-15 NOTE — Telephone Encounter (Signed)
Medication refill sent to pharmacy  

## 2016-09-20 ENCOUNTER — Ambulatory Visit (INDEPENDENT_AMBULATORY_CARE_PROVIDER_SITE_OTHER): Payer: Medicare Other | Admitting: Internal Medicine

## 2016-09-20 ENCOUNTER — Encounter: Payer: Self-pay | Admitting: Internal Medicine

## 2016-09-20 ENCOUNTER — Other Ambulatory Visit (HOSPITAL_COMMUNITY): Payer: Self-pay | Admitting: *Deleted

## 2016-09-20 VITALS — BP 138/82 | HR 88 | Ht 64.5 in | Wt 191.0 lb

## 2016-09-20 DIAGNOSIS — Z950 Presence of cardiac pacemaker: Secondary | ICD-10-CM

## 2016-09-20 DIAGNOSIS — I442 Atrioventricular block, complete: Secondary | ICD-10-CM | POA: Diagnosis not present

## 2016-09-20 LAB — CUP PACEART INCLINIC DEVICE CHECK
Battery Impedance: 3281 Ohm
Brady Statistic AP VP Percent: 10 %
Brady Statistic AS VP Percent: 62 %
Brady Statistic AS VS Percent: 28 %
Implantable Lead Implant Date: 20090818
Implantable Lead Location: 753859
Implantable Lead Model: 5076
Lead Channel Impedance Value: 543 Ohm
Lead Channel Pacing Threshold Amplitude: 0.5 V
Lead Channel Pacing Threshold Pulse Width: 0.4 ms
Lead Channel Sensing Intrinsic Amplitude: 11.2 mV
Lead Channel Sensing Intrinsic Amplitude: 2.8 mV
MDC IDC LEAD IMPLANT DT: 20090818
MDC IDC LEAD LOCATION: 753860
MDC IDC MSMT BATTERY REMAINING LONGEVITY: 17 mo
MDC IDC MSMT BATTERY VOLTAGE: 2.72 V
MDC IDC MSMT LEADCHNL RA IMPEDANCE VALUE: 402 Ohm
MDC IDC MSMT LEADCHNL RV PACING THRESHOLD AMPLITUDE: 1 V
MDC IDC MSMT LEADCHNL RV PACING THRESHOLD PULSEWIDTH: 0.4 ms
MDC IDC SESS DTM: 20171004171731
MDC IDC SET LEADCHNL RA PACING AMPLITUDE: 2 V
MDC IDC SET LEADCHNL RV PACING AMPLITUDE: 2.5 V
MDC IDC SET LEADCHNL RV PACING PULSEWIDTH: 0.4 ms
MDC IDC SET LEADCHNL RV SENSING SENSITIVITY: 4 mV
MDC IDC STAT BRADY AP VS PERCENT: 0 %

## 2016-09-20 NOTE — Progress Notes (Signed)
HPI [name] returns today for followup. She is a very pleasant 80 year old woman with a history of intermittent complete heart block, status post permanent pacemaker insertion, hypertension, dyslipidemia, and stroke. She has developed chronic atrial fibrillation. The patient has done well in the interim, and is tolerating her anticoagulation very nicely. Previously, she took Lovenox injections, but is now on Xarelto. She denies chest pain or syncope. She does have generalized fatigue and c/o sob. She can walk about 200 feet or less, and has to stop for rest.  Allergies  Allergen Reactions  . Cephalexin Other (See Comments)    diarrhea  . Imitrex [Sumatriptan] Other (See Comments)    Reaction unknown  . Sulfa Antibiotics Nausea Only    Doesn't like to drink lots of water.     Current Outpatient Prescriptions  Medication Sig Dispense Refill  . acetaminophen (TYLENOL) 500 MG tablet Take 500 mg by mouth every 6 (six) hours as needed for mild pain.    . Azelastine HCl 0.15 % SOLN USE 1 TO 2 SPRAYS IN EACH  NOSTRIL DAILY 30 mL 5  . cetirizine (ZYRTEC) 10 MG tablet Take 10 mg by mouth every morning.     . cholecalciferol (VITAMIN D) 1000 UNITS tablet Take 1,000 Units by mouth every morning.     . clorazepate (TRANXENE) 7.5 MG tablet Take 1 tablet (7.5 mg total) by mouth 3 (three) times daily. 90 tablet 5  . fluticasone (FLONASE) 50 MCG/ACT nasal spray 1-2 puffs each nostril once daily 48 g 3  . fluticasone (FLOVENT HFA) 44 MCG/ACT inhaler Inhale 2 puffs into the lungs 2 (two) times daily. 1 Inhaler 5  . folic acid (FOLVITE) A999333 MCG tablet Take 400 mcg by mouth every morning.    . furosemide (LASIX) 20 MG tablet Take 1 tablet (20 mg total) by mouth every evening. 90 tablet 3  . furosemide (LASIX) 40 MG tablet Take 1 tablet (40 mg total) by mouth every morning. 90 tablet 3  . ipratropium-albuterol (DUONEB) 0.5-2.5 (3) MG/3ML SOLN Take 3 mLs by nebulization 2 (two) times daily. 180 mL 5  .  lisinopril (PRINIVIL,ZESTRIL) 5 MG tablet Take 1 tablet (5 mg total) by mouth daily. 30 tablet 3  . magic mouthwash SOLN Swish and swallow 5 ml 4 times daily if needed 200 mL 2  . methocarbamol (ROBAXIN) 500 MG tablet Take 1 tablet (500 mg total) by mouth every 6 (six) hours as needed for muscle spasms. 40 tablet 1  . metoprolol succinate (TOPROL-XL) 25 MG 24 hr tablet Take 1 tablet by mouth  daily before breakfast 90 tablet 3  . Multiple Vitamin (MULTIVITAMIN) tablet Take 1 tablet by mouth every morning.     . Multiple Vitamins-Minerals (PROTEGRA PO) Take 1 tablet by mouth every morning.    . pantoprazole (PROTONIX) 40 MG tablet Take 1 tablet by mouth  twice a day 180 tablet 3  . Polyethyl Glycol-Propyl Glycol (SYSTANE) 0.4-0.3 % SOLN Place 2 drops into both eyes 4 (four) times daily.     . potassium chloride (K-DUR) 10 MEQ tablet Take 2 tablets (20 mEq total) by mouth daily. 90 tablet 3  . pravastatin (PRAVACHOL) 40 MG tablet Take 1 tablet by mouth at  bedtime 90 tablet 3  . sennosides-docusate sodium (SENOKOT-S) 8.6-50 MG tablet Take 2 tablets by mouth at bedtime.     Alveda Reasons 20 MG TABS tablet Take 1 tablet by mouth  daily 90 tablet 3   No current facility-administered medications for this  visit.      Past Medical History:  Diagnosis Date  . Allergic rhinitis   . Anxiety   . Cardiac pacemaker in situ    DOI  08-04-2008  medtronic dual chamber  . Chronic anticoagulation   . Chronic combined systolic and diastolic CHF, NYHA class 2 ([place])    [place] by cardiologist-- dr Latavius Capizzi--  ef 45%  . Coronary artery disease CARDIOLOGIST-- DR Cristopher Peru   per cath-- Non-obstructive minimal CAD  . Fluid retention   . GERD (gastroesophageal reflux disease)   . History of cervical dysplasia    CIN III  . History of CVA (cerebrovascular accident)    09-15-2005  Left Cerebellar Hematoma with mild stem compression whild on coumadin for DVT  . History of DVT of lower extremity   . History of  non-ST elevation myocardial infarction (NSTEMI)    2011-- in setting of bilateral PE's and DVT's and CHF  . History of peptic ulcer disease   . History of pulmonary embolus (PE)   . Hyperlipidemia   . Hypertension   . Incontinence of urine   . LBBB (left bundle branch block)   . Mild obstructive sleep apnea    PT USES Humidied Nasal Mask with O2 at2.5L (study 08-05-2009)  . Mild pulmonary hypertension   . Moderate intermittent asthma   . OA (osteoarthritis)   . PAF (paroxysmal atrial fibrillation) ([place])   . Renal insufficiency   . Sarcoidosis ([place])   . Shortness of breath    OCCASIONAL  . Thrombophilia ([place])    hemotologist--  dr Marin Olp--  monitors pt anitcoagulation  . Urgency of urination   . Venous insufficiency   . VIN III (vulvar intraepithelial neoplasia III)     ROS:   All systems reviewed and negative except as noted in the HPI.   Past Surgical History:  Procedure Laterality Date  . ABDOMINAL HYSTERECTOMY  1970's  . APPENDECTOMY  1966  . CARDIAC CATHETERIZATION  08-26-2003   dr Lorenz Coaster. non-obstructive CAD & LVD with ef 46%, global HK, 2+MR, PA press 50/24 w/ wedge 20  . CARDIAC CATHETERIZATION  08-03-2008   dr Angelena Form   Non-obstructive CAD/  30-40% pLAD,  30% ostial CFX/  symptomatic bradycardia/  normal renal arteries  . CARDIAC CATHETERIZATION N/A 07/21/2016   Procedure: Right Heart Cath;  Surgeon: Larey Dresser, MD;  Location: [facility];  Service: Cardiovascular;  Laterality: N/A;  . CARDIAC PACEMAKER PLACEMENT  08-04-2008  dr Lovena Le Spiros [place]   for CHB--  MEDTRONIC DUAL CHAMBER  . CATARACTS REMOVED    . CHOLECYSTECTOMY  01/1996  . DECCOMPRESSION ULNAR NERVE AT CUBITAL TUNNEL AND RESECTION MUSCLE Left 03-17-2008  . EXCISION LEFT SUPRACLAVICULAR LYMPH NODE   04-22-2007   granulomatous inflammation, extensive  . INSERTION OF VENA CAVA FILTER  08/ 2011  . KNEE ARTHROSCOPY     LEFT  . LUMBAR [place] SURGERY  x2   1990  . SHOULDER ARTHROSCOPY WITH  ROTATOR CUFF REPAIR AND SUBACROMIAL DECOMPRESSION Right 02-08-2009  . TOTAL KNEE ARTHROPLASTY Left 04/21/2013   Procedure: LEFT TOTAL KNEE ARTHROPLASTY;  Surgeon: Gearlean Alf, MD;  Location: [facility];  Service: Orthopedics;  Laterality: Left;  . TOTAL KNEE ARTHROPLASTY Right 04/03/2016   Procedure: RIGHT TOTAL KNEE ARTHROPLASTY;  Surgeon: Gaynelle Arabian, MD;  Location: [facility];  Service: Orthopedics;  Laterality: Right;  . TRANSTHORACIC ECHOCARDIOGRAM  last one 05-19-2014   mild LVH/  ef 45-50%/  septal-lateral dyssynchroncy consistant with  LBBB/  diffuse hypokinesis/  grade I diastolic dysfunction/  trivial MR and TR/  mild LAE  . TUBAL LIGATION    . VULVAR LESION REMOVAL  07/16/2012   Procedure: VULVAR LESION;  Surgeon: Imagene Gurney A. Alycia Rossetti, MD;  Location: [facility];  Service: Gynecology;  Laterality: Right;  Wide Local Excision of the Vulvar  . VULVECTOMY N/A 02/19/2015   Procedure: SIMPLE PARTIAL VULVECTOMY;  Surgeon: [name], MD;  [facility];  Service: Gynecology;  Laterality: N/A;     Family History  Problem Relation Age of Onset  . Heart attack Father   . Breast cancer Sister     mets  . Heart attack Brother   . Kidney failure Sister   . Colon cancer Neg Hx   . Esophageal cancer Neg Hx   . Pancreatic cancer Neg Hx   . Liver disease Neg Hx      Social History   Social History  . Marital status: Widowed    Spouse name: N/A  . Number of children: 4  . Years of education: N/A   Occupational History  . retired    Social History Main Topics  . Smoking status: Never Smoker  . Smokeless tobacco: Never Used     Comment: NEVER USED TOBACCO  . Alcohol use No  . Drug use: No  . Sexual activity: Not on file   Other Topics Concern  . Not on file   Social History Narrative  . No narrative on file     BP 138/82   Pulse 88   Ht 5' 4.5" (1.638 m)   Wt 191 lb (86.6 kg)   BMI 32.28 kg/m   Physical Exam:  stable appearing 80 year old woman,NAD HEENT:  Unremarkable Neck:  7 cm JVD, no thyromegally Back:  No CVA tenderness Lungs:  Clear with no wheezes, rales, or rhonchi. HEART:  IRegular rate rhythm, no murmurs, no rubs, no clicks Abd:  soft, positive bowel sounds, no organomegally, no rebound, no guarding Ext:  2 plus pulses, no edema, no cyanosis, no clubbing Skin:  No rashes no nodules Neuro:  CN II through XII intact, motor grossly intact  DEVICE  Normal device function.  See PaceArt for details.   Assess/Plan: 1. Complete heart block - she is doing well status post pacemaker insertion. She is pacing approximately 75% of the time in the ventricle. 2. Pacemaker - her Medtronic dual-chamber pacemaker is working normally. She is approximately 18 months of battery longevity. 3. Atrial fibrillation - her ventricular rate is well controlled. No change in medical therapy. She is tolerating her anticoagulation very nicely. 4. Chronic diastolic heart failure - her symptoms are class IIb. We discussed the importance of regular activity and a low-sodium diet. We also discussed additional diuretic therapy, for now I've recommended that she continue as she is with the option to uptitrate her Lasix in the future if needed for volume overload or shortness of breath.

## 2016-09-20 NOTE — Patient Instructions (Signed)
Medication Instructions:  Your physician recommends that you continue on your current medications as directed. Please refer to the Current Medication list given to you today.   Labwork: None Ordered   Testing/Procedures: None Ordered   Follow-Up: Your physician wants you to follow-up in: 1 year with Dr. Lovena Le. You will receive a reminder letter in the mail two months in advance. If you don't receive a letter, please call our office to schedule the follow-up appointment.  Remote monitoring is used to monitor your Pacemaker from home. This monitoring reduces the number of office visits required to check your device to one time per year. It allows [place] to keep an eye on the functioning of your device to ensure it is working properly. You are scheduled for a device check from home on 11/20/16. You may send your transmission at any time that day. If you have a wireless device, the transmission will be sent automatically. After your physician reviews your transmission, you will receive a postcard with your next transmission date.    Any Other Special Instructions Will Be Listed Below (If Applicable).     If you need a refill on your cardiac medications before your next appointment, please call your pharmacy.

## 2016-09-26 ENCOUNTER — Other Ambulatory Visit: Payer: Self-pay | Admitting: *Deleted

## 2016-09-26 NOTE — Telephone Encounter (Signed)
Rec'd fax for refill on pt furosemide. Per chart pt cardiologist already filled on 09/12/16 Dr. Aundra Dubin. Faxing back denied...Johny Chess

## 2016-09-28 ENCOUNTER — Telehealth: Payer: Self-pay | Admitting: *Deleted

## 2016-09-28 NOTE — Telephone Encounter (Signed)
That does not sound right  - is it a shortage?  Is it just [facility] - can she go to a local pharmacy?

## 2016-09-28 NOTE — Telephone Encounter (Signed)
Left msg on triage stating she received a letter from [facility] stating the Furosemide 40 mg is being D/C of market needing alternative sent to them. MD is out of office pls advise...Johny Chess

## 2016-09-29 NOTE — Telephone Encounter (Signed)
Pt return call back she wasn't sure what [facility] was saying. She states they just said Dr. Jenny Reichmann denied her medication, and she was assuming it was the Furosemide because she is out of her 40 mg. Inform will contact [facility] to see what exactly going on. Called [facility] spoke w/rep they was wanting to know if pt suppose to be taking both strength 20 ng & 40 mg. Inform them yes she is suppose to take 40 mg in the morning, and then 20 mg tab in the evening which was ordered by her cardiologist. Rep states will updated her records and ship out the 40 mg today. Notified pt with info...lmb

## 2016-09-29 NOTE — Telephone Encounter (Signed)
Called pt no answer LMOM RTC.../lmb 

## 2016-10-05 ENCOUNTER — Other Ambulatory Visit (HOSPITAL_COMMUNITY): Payer: Self-pay | Admitting: *Deleted

## 2016-10-05 MED ORDER — LISINOPRIL 5 MG PO TABS: 5.0000 mg | ORAL_TABLET | Freq: Every day | ORAL | 3 refills | Status: DC

## 2016-10-09 ENCOUNTER — Telehealth: Payer: Self-pay | Admitting: Internal Medicine

## 2016-10-09 NOTE — Telephone Encounter (Signed)
Yes- she can use her nebulizer machine 3 or even 4 times daily, if needed

## 2016-10-09 NOTE — Telephone Encounter (Signed)
Called and spoke with pt and she stated that she was sick over the weekend and was seen at Urgent care.  They started her on abx and she increased her neb tx to TID.  She wanted to ask CY if she could stay on this dose since this seemed to have helped her.  CY please advise. Thanks  Last ov--06/08/2016 Next ov--12/21/2016  Allergies  Allergen Reactions  . Cephalexin Other (See Comments)    diarrhea  . Imitrex [Sumatriptan] Other (See Comments)    Reaction unknown  . Sulfa Antibiotics Nausea Only    Doesn't like to drink lots of water.

## 2016-10-09 NOTE — Telephone Encounter (Signed)
Called and spoke with pt and she is aware of CY recs.  Nothing further is needed.  

## 2016-10-10 ENCOUNTER — Telehealth: Payer: Self-pay | Admitting: *Deleted

## 2016-10-10 MED ORDER — CLORAZEPATE DIPOTASSIUM 7.5 MG PO TABS: 7.5000 mg | ORAL_TABLET | Freq: Three times a day (TID) | ORAL | 5 refills | Status: AC

## 2016-10-10 NOTE — Telephone Encounter (Signed)
Rec'd fax pt requesting refill on her Clorazepate...Johny Chess

## 2016-10-10 NOTE — Telephone Encounter (Signed)
Done hardcopy to Corinne  

## 2016-10-11 NOTE — Telephone Encounter (Signed)
faxed

## 2016-11-08 ENCOUNTER — Ambulatory Visit: Payer: Medicare Other | Admitting: Internal Medicine

## 2016-11-18 ENCOUNTER — Other Ambulatory Visit: Payer: Self-pay | Admitting: Cardiology

## 2016-11-20 ENCOUNTER — Ambulatory Visit (INDEPENDENT_AMBULATORY_CARE_PROVIDER_SITE_OTHER): Payer: Medicare Other | Admitting: *Deleted

## 2016-11-20 DIAGNOSIS — Z950 Presence of cardiac pacemaker: Secondary | ICD-10-CM

## 2016-11-20 NOTE — Progress Notes (Signed)
Remote pacemaker transmission.   

## 2016-11-24 LAB — CUP PACEART REMOTE DEVICE CHECK
Battery Remaining Longevity: 16 mo
Battery Voltage: 2.71 V
Brady Statistic AS VP Percent: 3 %
Implantable Lead Implant Date: 20090818
Implantable Lead Location: 753859
Implantable Lead Model: 5076
Implantable Pulse Generator Implant Date: 20090818
Lead Channel Pacing Threshold Amplitude: 0.5 V
Lead Channel Pacing Threshold Pulse Width: 0.4 ms
Lead Channel Setting Pacing Amplitude: 2 V
Lead Channel Setting Pacing Pulse Width: 0.4 ms
MDC IDC LEAD IMPLANT DT: 20090818
MDC IDC LEAD LOCATION: 753860
MDC IDC MSMT BATTERY IMPEDANCE: 3617 Ohm
MDC IDC MSMT LEADCHNL RA IMPEDANCE VALUE: 384 Ohm
MDC IDC MSMT LEADCHNL RA PACING THRESHOLD PULSEWIDTH: 0.4 ms
MDC IDC MSMT LEADCHNL RV IMPEDANCE VALUE: 478 Ohm
MDC IDC MSMT LEADCHNL RV PACING THRESHOLD AMPLITUDE: 1.125 V
MDC IDC SESS DTM: 20171204122717
MDC IDC SET LEADCHNL RV PACING AMPLITUDE: 2.5 V
MDC IDC SET LEADCHNL RV SENSING SENSITIVITY: 4 mV
MDC IDC STAT BRADY AP VP PERCENT: 4 %
MDC IDC STAT BRADY AP VS PERCENT: 1 %
MDC IDC STAT BRADY AS VS PERCENT: 93 %

## 2016-11-29 ENCOUNTER — Encounter: Payer: Self-pay | Admitting: Cardiology

## 2016-12-13 ENCOUNTER — Encounter: Payer: Self-pay | Admitting: Cardiology

## 2016-12-21 ENCOUNTER — Encounter: Payer: Medicare Other | Admitting: *Deleted

## 2016-12-21 ENCOUNTER — Ambulatory Visit: Payer: Medicare Other | Admitting: Internal Medicine

## 2016-12-21 ENCOUNTER — Telehealth: Payer: Self-pay | Admitting: Cardiology

## 2016-12-21 NOTE — Telephone Encounter (Signed)
LMOVM reminding pt to send remote transmission.   

## 2016-12-22 ENCOUNTER — Encounter: Payer: Self-pay | Admitting: Cardiology

## 2017-01-06 ENCOUNTER — Telehealth: Payer: Self-pay | Admitting: Physician Assistant

## 2017-01-06 NOTE — Telephone Encounter (Signed)
Paged by answering service. Pt has gained 5 lb in 3 days. Pt is dyspneic with PT today. Denies shortness of breath otherwise normal routine. Sleep chronically on elevated bed. Her BP and pulse is normal. She does not want to takes any extra lasix. Currently taking 40mg  in Am and 20mg  at lunch time. She has cut back on salt. Denies PND, LE edema, chest pain, palpitation or syncope.   She will give [place] call on Monday if weight continues to go up or develops worsening shortness of breath.

## 2017-01-25 ENCOUNTER — Ambulatory Visit (INDEPENDENT_AMBULATORY_CARE_PROVIDER_SITE_OTHER): Payer: Medicare Other | Admitting: *Deleted

## 2017-01-25 ENCOUNTER — Telehealth: Payer: Self-pay | Admitting: Cardiology

## 2017-01-25 DIAGNOSIS — Z950 Presence of cardiac pacemaker: Secondary | ICD-10-CM

## 2017-01-25 NOTE — Telephone Encounter (Signed)
LMOVM reminding pt to send remote transmission.   

## 2017-01-26 ENCOUNTER — Encounter: Payer: Self-pay | Admitting: Cardiology

## 2017-01-26 LAB — CUP PACEART REMOTE DEVICE CHECK
Brady Statistic AP VS Percent: 1 %
Brady Statistic AS VP Percent: 3 %
Brady Statistic AS VS Percent: 92 %
Date Time Interrogation Session: 20180209100747
Implantable Lead Implant Date: 20090818
Implantable Lead Location: 753859
Implantable Lead Model: 5076
Lead Channel Pacing Threshold Amplitude: 0.5 V
Lead Channel Pacing Threshold Amplitude: 1.25 V
Lead Channel Pacing Threshold Pulse Width: 0.4 ms
Lead Channel Pacing Threshold Pulse Width: 0.4 ms
MDC IDC LEAD IMPLANT DT: 20090818
MDC IDC LEAD LOCATION: 753860
MDC IDC MSMT BATTERY IMPEDANCE: 3653 Ohm
MDC IDC MSMT BATTERY REMAINING LONGEVITY: 16 mo
MDC IDC MSMT BATTERY VOLTAGE: 2.71 V
MDC IDC MSMT LEADCHNL RA IMPEDANCE VALUE: 394 Ohm
MDC IDC MSMT LEADCHNL RV IMPEDANCE VALUE: 492 Ohm
MDC IDC PG IMPLANT DT: 20090818
MDC IDC SET LEADCHNL RA PACING AMPLITUDE: 2 V
MDC IDC SET LEADCHNL RV PACING AMPLITUDE: 2.5 V
MDC IDC SET LEADCHNL RV PACING PULSEWIDTH: 0.4 ms
MDC IDC SET LEADCHNL RV SENSING SENSITIVITY: 2.8 mV
MDC IDC STAT BRADY AP VP PERCENT: 4 %

## 2017-01-26 NOTE — Progress Notes (Signed)
Remote pacemaker transmission.   

## 2017-02-09 ENCOUNTER — Encounter: Payer: Self-pay | Admitting: Cardiology

## 2017-02-19 ENCOUNTER — Other Ambulatory Visit: Payer: Self-pay | Admitting: Family Medicine

## 2017-02-19 ENCOUNTER — Ambulatory Visit
Admission: RE | Admit: 2017-02-19 | Discharge: 2017-02-19 | Disposition: A | Discharge status: Home / Self Care | Payer: Medicare Other | Source: Ambulatory Visit | Attending: Family Medicine | Admitting: Family Medicine

## 2017-02-19 DIAGNOSIS — R059 Cough, unspecified: Secondary | ICD-10-CM

## 2017-02-19 DIAGNOSIS — R05 Cough: Secondary | ICD-10-CM

## 2017-02-26 ENCOUNTER — Ambulatory Visit (INDEPENDENT_AMBULATORY_CARE_PROVIDER_SITE_OTHER): Payer: Medicare Other | Admitting: *Deleted

## 2017-02-26 DIAGNOSIS — Z950 Presence of cardiac pacemaker: Secondary | ICD-10-CM

## 2017-02-27 NOTE — Progress Notes (Signed)
Remote pacemaker transmission.   

## 2017-02-28 ENCOUNTER — Encounter: Payer: Self-pay | Admitting: Cardiology

## 2017-02-28 LAB — CUP PACEART REMOTE DEVICE CHECK
Battery Impedance: 3677 Ohm
Brady Statistic AP VP Percent: 4 %
Brady Statistic AP VS Percent: 1 %
Brady Statistic AS VS Percent: 92 %
Date Time Interrogation Session: 20180312090726
Implantable Lead Implant Date: 20090818
Implantable Lead Location: 753859
Implantable Lead Model: 5076
Implantable Pulse Generator Implant Date: 20090818
Lead Channel Impedance Value: 490 Ohm
Lead Channel Pacing Threshold Pulse Width: 0.4 ms
Lead Channel Sensing Intrinsic Amplitude: 2.8 mV
Lead Channel Sensing Intrinsic Amplitude: 5.6 mV
Lead Channel Setting Pacing Amplitude: 2 V
Lead Channel Setting Pacing Pulse Width: 0.4 ms
Lead Channel Setting Sensing Sensitivity: 4 mV
MDC IDC LEAD IMPLANT DT: 20090818
MDC IDC LEAD LOCATION: 753860
MDC IDC MSMT BATTERY REMAINING LONGEVITY: 16 mo
MDC IDC MSMT BATTERY VOLTAGE: 2.71 V
MDC IDC MSMT LEADCHNL RA IMPEDANCE VALUE: 387 Ohm
MDC IDC MSMT LEADCHNL RA PACING THRESHOLD AMPLITUDE: 0.5 V
MDC IDC MSMT LEADCHNL RA PACING THRESHOLD PULSEWIDTH: 0.4 ms
MDC IDC MSMT LEADCHNL RV PACING THRESHOLD AMPLITUDE: 0.875 V
MDC IDC SET LEADCHNL RV PACING AMPLITUDE: 2.5 V
MDC IDC STAT BRADY AS VP PERCENT: 2 %

## 2017-03-15 ENCOUNTER — Encounter: Payer: Self-pay | Admitting: Cardiology

## 2017-03-30 ENCOUNTER — Ambulatory Visit (INDEPENDENT_AMBULATORY_CARE_PROVIDER_SITE_OTHER): Payer: Medicare Other | Admitting: *Deleted

## 2017-03-30 DIAGNOSIS — I442 Atrioventricular block, complete: Secondary | ICD-10-CM

## 2017-03-30 LAB — CUP PACEART REMOTE DEVICE CHECK
Battery Impedance: 3953 Ohm
Battery Remaining Longevity: 14 mo
Battery Voltage: 2.71 V
Brady Statistic AP VP Percent: 4 %
Brady Statistic AS VP Percent: 2 %
Date Time Interrogation Session: 20180413104519
Implantable Lead Implant Date: 20090818
Implantable Lead Location: 753860
Implantable Lead Model: 5076
Implantable Pulse Generator Implant Date: 20090818
Lead Channel Impedance Value: 395 Ohm
Lead Channel Pacing Threshold Amplitude: 0.5 V
Lead Channel Pacing Threshold Pulse Width: 0.4 ms
Lead Channel Sensing Intrinsic Amplitude: 2.8 mV
Lead Channel Setting Pacing Amplitude: 2.5 V
MDC IDC LEAD IMPLANT DT: 20090818
MDC IDC LEAD LOCATION: 753859
MDC IDC MSMT LEADCHNL RV IMPEDANCE VALUE: 534 Ohm
MDC IDC MSMT LEADCHNL RV PACING THRESHOLD AMPLITUDE: 1 V
MDC IDC MSMT LEADCHNL RV PACING THRESHOLD PULSEWIDTH: 0.4 ms
MDC IDC MSMT LEADCHNL RV SENSING INTR AMPL: 5.6 mV
MDC IDC SET LEADCHNL RA PACING AMPLITUDE: 2 V
MDC IDC SET LEADCHNL RV PACING PULSEWIDTH: 0.4 ms
MDC IDC SET LEADCHNL RV SENSING SENSITIVITY: 4 mV
MDC IDC STAT BRADY AP VS PERCENT: 1 %
MDC IDC STAT BRADY AS VS PERCENT: 93 %

## 2017-03-30 NOTE — Progress Notes (Signed)
Remote pacemaker transmission.   

## 2017-04-04 ENCOUNTER — Encounter: Payer: Self-pay | Admitting: Cardiology

## 2017-04-18 ENCOUNTER — Encounter: Payer: Self-pay | Admitting: Cardiology

## 2017-04-30 ENCOUNTER — Ambulatory Visit (INDEPENDENT_AMBULATORY_CARE_PROVIDER_SITE_OTHER): Payer: Self-pay | Admitting: *Deleted

## 2017-04-30 DIAGNOSIS — Z950 Presence of cardiac pacemaker: Secondary | ICD-10-CM

## 2017-04-30 NOTE — Progress Notes (Signed)
Remote pacemaker transmission.   

## 2017-05-01 ENCOUNTER — Encounter: Payer: Self-pay | Admitting: Cardiology

## 2017-05-02 LAB — CUP PACEART REMOTE DEVICE CHECK
Battery Voltage: 2.71 V
Brady Statistic AP VS Percent: 1 %
Brady Statistic AS VP Percent: 2 %
Brady Statistic AS VS Percent: 93 %
Date Time Interrogation Session: 20180514103002
Implantable Lead Implant Date: 20090818
Implantable Lead Location: 753859
Lead Channel Pacing Threshold Amplitude: 1.25 V
Lead Channel Pacing Threshold Pulse Width: 0.4 ms
Lead Channel Pacing Threshold Pulse Width: 0.4 ms
Lead Channel Setting Pacing Pulse Width: 0.4 ms
MDC IDC LEAD IMPLANT DT: 20090818
MDC IDC LEAD LOCATION: 753860
MDC IDC MSMT BATTERY IMPEDANCE: 3959 Ohm
MDC IDC MSMT BATTERY REMAINING LONGEVITY: 14 mo
MDC IDC MSMT LEADCHNL RA IMPEDANCE VALUE: 389 Ohm
MDC IDC MSMT LEADCHNL RA PACING THRESHOLD AMPLITUDE: 0.625 V
MDC IDC MSMT LEADCHNL RV IMPEDANCE VALUE: 546 Ohm
MDC IDC PG IMPLANT DT: 20090818
MDC IDC SET LEADCHNL RA PACING AMPLITUDE: 2 V
MDC IDC SET LEADCHNL RV PACING AMPLITUDE: 2.5 V
MDC IDC SET LEADCHNL RV SENSING SENSITIVITY: 2.8 mV
MDC IDC STAT BRADY AP VP PERCENT: 4 %

## 2017-05-15 ENCOUNTER — Encounter: Payer: Self-pay | Admitting: Cardiology

## 2017-05-31 ENCOUNTER — Ambulatory Visit (INDEPENDENT_AMBULATORY_CARE_PROVIDER_SITE_OTHER): Payer: Medicare Other | Admitting: *Deleted

## 2017-05-31 DIAGNOSIS — I442 Atrioventricular block, complete: Secondary | ICD-10-CM

## 2017-05-31 LAB — CUP PACEART REMOTE DEVICE CHECK
Battery Remaining Longevity: 13 mo
Battery Voltage: 2.71 V
Brady Statistic AS VP Percent: 2 %
Date Time Interrogation Session: 20180614122809
Implantable Lead Implant Date: 20090818
Implantable Lead Location: 753859
Implantable Pulse Generator Implant Date: 20090818
Lead Channel Pacing Threshold Amplitude: 0.5 V
Lead Channel Pacing Threshold Amplitude: 1.125 V
Lead Channel Pacing Threshold Pulse Width: 0.4 ms
Lead Channel Setting Pacing Amplitude: 2 V
Lead Channel Setting Pacing Pulse Width: 0.4 ms
Lead Channel Setting Sensing Sensitivity: 2.8 mV
MDC IDC LEAD IMPLANT DT: 20090818
MDC IDC LEAD LOCATION: 753860
MDC IDC MSMT BATTERY IMPEDANCE: 3996 Ohm
MDC IDC MSMT LEADCHNL RA IMPEDANCE VALUE: 391 Ohm
MDC IDC MSMT LEADCHNL RA PACING THRESHOLD PULSEWIDTH: 0.4 ms
MDC IDC MSMT LEADCHNL RV IMPEDANCE VALUE: 510 Ohm
MDC IDC SET LEADCHNL RV PACING AMPLITUDE: 2.5 V
MDC IDC STAT BRADY AP VP PERCENT: 4 %
MDC IDC STAT BRADY AP VS PERCENT: 1 %
MDC IDC STAT BRADY AS VS PERCENT: 93 %

## 2017-05-31 NOTE — Progress Notes (Signed)
Remote pacemaker transmission.   

## 2017-06-01 ENCOUNTER — Encounter: Payer: Self-pay | Admitting: Cardiology

## 2017-06-08 ENCOUNTER — Other Ambulatory Visit: Payer: Self-pay | Admitting: Hematology & Oncology

## 2017-06-15 ENCOUNTER — Encounter: Payer: Self-pay | Admitting: Cardiology

## 2017-06-23 ENCOUNTER — Other Ambulatory Visit: Payer: Self-pay | Admitting: Internal Medicine

## 2017-07-02 ENCOUNTER — Ambulatory Visit (INDEPENDENT_AMBULATORY_CARE_PROVIDER_SITE_OTHER): Payer: Medicare Other | Admitting: *Deleted

## 2017-07-02 DIAGNOSIS — I442 Atrioventricular block, complete: Secondary | ICD-10-CM | POA: Diagnosis not present

## 2017-07-02 NOTE — Progress Notes (Signed)
Remote pacemaker transmission.   

## 2017-07-03 LAB — CUP PACEART REMOTE DEVICE CHECK
Battery Remaining Longevity: 13 mo
Brady Statistic AP VP Percent: 4 %
Brady Statistic AP VS Percent: 1 %
Brady Statistic AS VP Percent: 2 %
Brady Statistic AS VS Percent: 92 %
Implantable Lead Implant Date: 20090818
Implantable Lead Implant Date: 20090818
Implantable Lead Location: 753859
Implantable Lead Model: 5076
Implantable Pulse Generator Implant Date: 20090818
Lead Channel Impedance Value: 395 Ohm
Lead Channel Impedance Value: 498 Ohm
Lead Channel Pacing Threshold Amplitude: 0.5 V
Lead Channel Pacing Threshold Amplitude: 1.125 V
Lead Channel Pacing Threshold Pulse Width: 0.4 ms
Lead Channel Pacing Threshold Pulse Width: 0.4 ms
Lead Channel Setting Pacing Amplitude: 2 V
MDC IDC LEAD LOCATION: 753860
MDC IDC MSMT BATTERY IMPEDANCE: 4069 Ohm
MDC IDC MSMT BATTERY VOLTAGE: 2.71 V
MDC IDC SESS DTM: 20180716093435
MDC IDC SET LEADCHNL RV PACING AMPLITUDE: 2.5 V
MDC IDC SET LEADCHNL RV PACING PULSEWIDTH: 0.4 ms
MDC IDC SET LEADCHNL RV SENSING SENSITIVITY: 2.8 mV

## 2017-07-04 ENCOUNTER — Encounter: Payer: Self-pay | Admitting: Cardiology

## 2017-07-14 ENCOUNTER — Other Ambulatory Visit: Payer: Self-pay | Admitting: Internal Medicine

## 2017-07-18 ENCOUNTER — Encounter: Payer: Self-pay | Admitting: Cardiology

## 2017-08-02 ENCOUNTER — Ambulatory Visit (INDEPENDENT_AMBULATORY_CARE_PROVIDER_SITE_OTHER): Payer: Self-pay | Admitting: *Deleted

## 2017-08-02 DIAGNOSIS — I442 Atrioventricular block, complete: Secondary | ICD-10-CM

## 2017-08-02 DIAGNOSIS — Z950 Presence of cardiac pacemaker: Secondary | ICD-10-CM

## 2017-08-03 NOTE — Progress Notes (Signed)
Remote pacemaker check. 

## 2017-08-09 LAB — CUP PACEART REMOTE DEVICE CHECK
Battery Impedance: 4157 Ohm
Battery Voltage: 2.7 V
Date Time Interrogation Session: 20180816101820
Implantable Lead Implant Date: 20090818
Implantable Lead Location: 753860
Implantable Lead Model: 5076
Implantable Pulse Generator Implant Date: 20090818
Lead Channel Pacing Threshold Amplitude: 0.5 V
Lead Channel Setting Pacing Amplitude: 2 V
Lead Channel Setting Pacing Amplitude: 2.5 V
Lead Channel Setting Pacing Pulse Width: 0.4 ms
Lead Channel Setting Sensing Sensitivity: 2.8 mV
MDC IDC LEAD IMPLANT DT: 20090818
MDC IDC LEAD LOCATION: 753859
MDC IDC MSMT BATTERY REMAINING LONGEVITY: 12 mo
MDC IDC MSMT LEADCHNL RA IMPEDANCE VALUE: 387 Ohm
MDC IDC MSMT LEADCHNL RA PACING THRESHOLD PULSEWIDTH: 0.4 ms
MDC IDC MSMT LEADCHNL RV IMPEDANCE VALUE: 526 Ohm
MDC IDC MSMT LEADCHNL RV PACING THRESHOLD AMPLITUDE: 1 V
MDC IDC MSMT LEADCHNL RV PACING THRESHOLD PULSEWIDTH: 0.4 ms
MDC IDC STAT BRADY AP VP PERCENT: 5 %
MDC IDC STAT BRADY AP VS PERCENT: 1 %
MDC IDC STAT BRADY AS VP PERCENT: 2 %
MDC IDC STAT BRADY AS VS PERCENT: 92 %

## 2017-08-17 ENCOUNTER — Encounter: Payer: Self-pay | Admitting: Cardiology

## 2017-08-31 ENCOUNTER — Encounter: Payer: Self-pay | Admitting: Cardiology

## 2017-09-04 ENCOUNTER — Ambulatory Visit (INDEPENDENT_AMBULATORY_CARE_PROVIDER_SITE_OTHER): Payer: Self-pay | Admitting: *Deleted

## 2017-09-04 ENCOUNTER — Telehealth: Payer: Self-pay | Admitting: Cardiology

## 2017-09-04 DIAGNOSIS — I442 Atrioventricular block, complete: Secondary | ICD-10-CM

## 2017-09-04 NOTE — Telephone Encounter (Signed)
Spoke with pt and reminded pt of remote transmission that is due today. Pt verbalized understanding.   

## 2017-09-05 LAB — CUP PACEART REMOTE DEVICE CHECK
Battery Voltage: 2.7 V
Brady Statistic AP VS Percent: 1 %
Brady Statistic AS VP Percent: 2 %
Date Time Interrogation Session: 20180918175512
Implantable Lead Implant Date: 20090818
Implantable Lead Location: 753859
Implantable Lead Model: 5076
Implantable Lead Model: 5076
Lead Channel Pacing Threshold Amplitude: 0.5 V
Lead Channel Pacing Threshold Amplitude: 1.125 V
Lead Channel Pacing Threshold Pulse Width: 0.4 ms
Lead Channel Pacing Threshold Pulse Width: 0.4 ms
Lead Channel Setting Pacing Amplitude: 2 V
Lead Channel Setting Pacing Pulse Width: 0.4 ms
MDC IDC LEAD IMPLANT DT: 20090818
MDC IDC LEAD LOCATION: 753860
MDC IDC MSMT BATTERY IMPEDANCE: 4157 Ohm
MDC IDC MSMT BATTERY REMAINING LONGEVITY: 12 mo
MDC IDC MSMT LEADCHNL RA IMPEDANCE VALUE: 412 Ohm
MDC IDC MSMT LEADCHNL RV IMPEDANCE VALUE: 486 Ohm
MDC IDC PG IMPLANT DT: 20090818
MDC IDC SET LEADCHNL RV PACING AMPLITUDE: 2.5 V
MDC IDC SET LEADCHNL RV SENSING SENSITIVITY: 2.8 mV
MDC IDC STAT BRADY AP VP PERCENT: 5 %
MDC IDC STAT BRADY AS VS PERCENT: 92 %

## 2017-09-05 NOTE — Progress Notes (Signed)
Remote pacemaker transmission.   

## 2017-09-06 ENCOUNTER — Encounter: Payer: Self-pay | Admitting: Cardiology

## 2017-10-10 ENCOUNTER — Telehealth: Payer: Self-pay

## 2017-10-10 ENCOUNTER — Ambulatory Visit (INDEPENDENT_AMBULATORY_CARE_PROVIDER_SITE_OTHER): Payer: Medicare Other | Admitting: Internal Medicine

## 2017-10-10 ENCOUNTER — Encounter: Payer: Self-pay | Admitting: Internal Medicine

## 2017-10-10 VITALS — BP 142/78 | HR 96 | Ht 62.0 in | Wt 206.0 lb

## 2017-10-10 DIAGNOSIS — Z950 Presence of cardiac pacemaker: Secondary | ICD-10-CM

## 2017-10-10 DIAGNOSIS — I442 Atrioventricular block, complete: Secondary | ICD-10-CM | POA: Diagnosis not present

## 2017-10-10 NOTE — Patient Instructions (Signed)
Medication Instructions:  Your physician recommends that you continue on your current medications as directed. Please refer to the Current Medication list given to you today.  Labwork: None ordered.  Testing/Procedures: None ordered.  Follow-Up: Your physician wants you to follow-up in: one year with Dr. Lovena Le.   You will receive a reminder letter in the mail two months in advance. If you don't receive a letter, please call our office to schedule the follow-up appointment.  You will have a battery check on your device 11/05/2017.  Any Other Special Instructions Will Be Listed Below (If Applicable).     If you need a refill on your cardiac medications before your next appointment, please call your pharmacy.

## 2017-10-10 NOTE — Progress Notes (Signed)
HPI [name] [name] returns today for ongoing evaluation and management of complete heart block, status post pacemaker insertion, hypertension, dyslipidemia, and remote stroke. She has persistent atrial fibrillation. She has tolerated oral anticoagulant therapy very nicely. In the interim, she has developed a cough on an ACE inhibitor and switched to losartan. She is still over a year from ERI. Allergies  Allergen Reactions  . Cephalexin Other (See Comments)    diarrhea  . Imitrex [Sumatriptan] Other (See Comments)    Reaction unknown  . Sulfa Antibiotics Nausea Only    Doesn't like to drink lots of water.     Current Outpatient Prescriptions  Medication Sig Dispense Refill  . acetaminophen (TYLENOL) 500 MG tablet Take 500 mg by mouth every 6 (six) hours as needed for mild pain.    . Azelastine HCl 0.15 % SOLN USE 1 TO 2 SPRAYS IN EACH  NOSTRIL DAILY 30 mL 5  . cetirizine (ZYRTEC) 10 MG tablet Take 10 mg by mouth every morning.     . clorazepate (TRANXENE) 7.5 MG tablet Take 1 tablet (7.5 mg total) by mouth 3 (three) times daily. 90 tablet 5  . fluticasone (FLOVENT HFA) 44 MCG/ACT inhaler Inhale 2 puffs into the lungs 2 (two) times daily. 1 Inhaler 5  . folic acid (FOLVITE) 213 MCG tablet Take 400 mcg by mouth every morning.    . furosemide (LASIX) 20 MG tablet Take 1 tablet (20 mg total) by mouth every evening. 90 tablet 3  . furosemide (LASIX) 40 MG tablet Take 1 tablet (40 mg total) by mouth every morning. 90 tablet 3  . ipratropium-albuterol (DUONEB) 0.5-2.5 (3) MG/3ML SOLN Take 3 mLs by nebulization 2 (two) times daily. 180 mL 5  . lisinopril (PRINIVIL,ZESTRIL) 5 MG tablet Take 1 tablet (5 mg total) by mouth daily. 90 tablet 3  . magic mouthwash SOLN Swish and swallow 5 ml 4 times daily if needed 200 mL 2  . methocarbamol (ROBAXIN) 500 MG tablet Take 1 tablet (500 mg total) by mouth every 6 (six) hours as needed for muscle spasms. 40 tablet 1  . metoprolol succinate (TOPROL-XL)  25 MG 24 hr tablet Take 1 tablet by mouth  daily before breakfast 90 tablet 3  . Multiple Vitamin (MULTIVITAMIN) tablet Take 1 tablet by mouth every morning.     . Multiple Vitamins-Minerals (PROTEGRA PO) Take 1 tablet by mouth every morning.    . pantoprazole (PROTONIX) 40 MG tablet Take 1 tablet by mouth  twice a day 180 tablet 3  . Polyethyl Glycol-Propyl Glycol (SYSTANE) 0.4-0.3 % SOLN Place 2 drops into both eyes 4 (four) times daily.     . potassium chloride (K-DUR) 10 MEQ tablet Take 2 tablets (20 mEq total) by mouth daily. 90 tablet 3  . potassium chloride (K-DUR,KLOR-CON) 10 MEQ tablet TAKE 2 TABLETS BY MOUTH  DAILY 180 tablet 3  . pravastatin (PRAVACHOL) 40 MG tablet Take 1 tablet by mouth at  bedtime 90 tablet 3  . sennosides-docusate sodium (SENOKOT-S) 8.6-50 MG tablet Take 2 tablets by mouth at bedtime.     Alveda Reasons 20 MG TABS tablet TAKE 1 TABLET BY MOUTH  DAILY 90 tablet 6   No current facility-administered medications for this visit.      Past Medical History:  Diagnosis Date  . Allergic rhinitis   . Anxiety   . Cardiac pacemaker in situ    DOI  08-04-2008  medtronic dual chamber  . Chronic anticoagulation   .  Chronic combined systolic and diastolic CHF, NYHA class 2 ([place])    montior by cardiologist-- dr Calinda Stockinger--  ef 45%  . Coronary artery disease CARDIOLOGIST-- DR Cristopher Peru   per cath-- Non-obstructive minimal CAD  . Fluid retention   . GERD (gastroesophageal reflux disease)   . History of cervical dysplasia    CIN III  . History of CVA (cerebrovascular accident)    09-15-2005  Left Cerebellar Hematoma with mild stem compression whild on coumadin for DVT  . History of DVT of lower extremity   . History of non-ST elevation myocardial infarction (NSTEMI)    2011-- in setting of bilateral PE's and DVT's and CHF  . History of peptic ulcer disease   . History of pulmonary embolus (PE)   . Hyperlipidemia   . Hypertension   . Incontinence of urine   . LBBB (left  bundle branch block)   . Mild obstructive sleep apnea    PT USES Humidied Nasal Mask with O2 at2.5L (study 08-05-2009)  . Mild pulmonary hypertension   . Moderate intermittent asthma   . OA (osteoarthritis)   . PAF (paroxysmal atrial fibrillation) ([place])   . Renal insufficiency   . Sarcoidosis ([place])   . Shortness of breath    OCCASIONAL  . Thrombophilia ([place])    hemotologist--  dr Marin Olp--  monitors pt anitcoagulation  . Urgency of urination   . Venous insufficiency   . VIN III (vulvar intraepithelial neoplasia III)     ROS:   All systems reviewed and negative except as noted in the HPI.   Past Surgical History:  Procedure Laterality Date  . ABDOMINAL HYSTERECTOMY  1970's  . APPENDECTOMY  1966  . CARDIAC CATHETERIZATION  08-26-2003   dr Lorenz Coaster. non-obstructive CAD & LVD with ef 46%, global HK, 2+MR, PA press 50/24 w/ wedge 20  . CARDIAC CATHETERIZATION  08-03-2008   dr Angelena Form   Non-obstructive CAD/  30-40% pLAD,  30% ostial CFX/  symptomatic bradycardia/  normal renal arteries  . CARDIAC CATHETERIZATION N/A 07/21/2016   Procedure: Right Heart Cath;  Surgeon: Larey Dresser, MD;  Location: [facility];  Service: Cardiovascular;  Laterality: N/A;  . CARDIAC PACEMAKER PLACEMENT  08-04-2008  dr Lovena Le Renardo Cheatum   for CHB--  MEDTRONIC DUAL CHAMBER  . CATARACTS REMOVED    . CHOLECYSTECTOMY  01/1996  . DECCOMPRESSION ULNAR NERVE AT CUBITAL TUNNEL AND RESECTION MUSCLE Left 03-17-2008  . EXCISION LEFT SUPRACLAVICULAR LYMPH NODE   04-22-2007   granulomatous inflammation, extensive  . INSERTION OF VENA CAVA FILTER  08/ 2011  . KNEE ARTHROSCOPY     LEFT  . LUMBAR [place] SURGERY  x2   1990  . SHOULDER ARTHROSCOPY WITH ROTATOR CUFF REPAIR AND SUBACROMIAL DECOMPRESSION Right 02-08-2009  . TOTAL KNEE ARTHROPLASTY Left 04/21/2013   Procedure: LEFT TOTAL KNEE ARTHROPLASTY;  Surgeon: Gearlean Alf, MD;  Location: [facility];  Service: Orthopedics;  Laterality: Left;  . TOTAL KNEE  ARTHROPLASTY Right 04/03/2016   Procedure: RIGHT TOTAL KNEE ARTHROPLASTY;  Surgeon: Gaynelle Arabian, MD;  Location: [facility];  Service: Orthopedics;  Laterality: Right;  . TRANSTHORACIC ECHOCARDIOGRAM  last one 05-19-2014   mild LVH/  ef 45-50%/  septal-lateral dyssynchroncy consistant with LBBB/  diffuse hypokinesis/  grade I diastolic dysfunction/  trivial MR and TR/  mild LAE  . TUBAL LIGATION    . VULVAR LESION REMOVAL  07/16/2012   Procedure: VULVAR LESION;  Surgeon: Imagene Gurney A. Alycia Rossetti, MD;  Location: [facility];  Service: Gynecology;  Laterality: Right;  Wide Local Excision of the Vulvar  . VULVECTOMY N/A 02/19/2015   Procedure: SIMPLE PARTIAL VULVECTOMY;  Surgeon: [name], MD;  [facility];  Service: Gynecology;  Laterality: N/A;     Family History  Problem Relation Age of Onset  . Heart attack Father   . Breast cancer Sister        mets  . Heart attack Brother   . Kidney failure Sister   . Colon cancer Neg Hx   . Esophageal cancer Neg Hx   . Pancreatic cancer Neg Hx   . Liver disease Neg Hx      Social History   Social History  . Marital status: Widowed    Spouse name: N/A  . Number of children: 4  . Years of education: N/A   Occupational History  . retired    Social History Main Topics  . Smoking status: Never Smoker  . Smokeless tobacco: Never Used     Comment: NEVER USED TOBACCO  . Alcohol use No  . Drug use: No  . Sexual activity: Not on file   Other Topics Concern  . Not on file   Social History Narrative  . No narrative on file     Pulse 96   Ht 5\' 2"  (1.575 m)   Wt 206 lb (93.4 kg)   BMI 37.68 kg/m   Physical Exam:  Well appearing NAD HEENT: Unremarkable Neck:  No JVD, no thyromegally Lymphatics:  No adenopathy Back:  No CVA tenderness Lungs:  Clear HEART:  Regular rate rhythm, no murmurs, no rubs, no clicks, split S2 and a soft S4.  Abd:  soft, positive bowel sounds, no organomegally, no rebound, no guarding Ext:  2  plus pulses, no edema, no cyanosis, no clubbing Skin:  No rashes no nodules Neuro:  CN II through XII intact, motor grossly intact  EKG - NSR with LBBB  DEVICE  Normal device function.  See PaceArt for details.   Assess/Plan: 1. Chronic systolic heart failure - her symptoms are class 2. She will undergo biV PPM upgrade in a year when she reaches ERI, sooner if her heart failure worsens. 2.PAF - she is in rhythm today. She will continue her [place].  3. PPM - her medtronic device is appraching ERI.  4. CAD - She denies anginal symptoms. No change in her meds.  Mikle Bosworth.D.

## 2017-10-10 NOTE — Telephone Encounter (Signed)
error 

## 2017-10-23 LAB — CUP PACEART INCLINIC DEVICE CHECK
Battery Impedance: 4082 Ohm
Battery Remaining Longevity: 13 mo
Battery Voltage: 2.7 V
Date Time Interrogation Session: 20181024174731
Implantable Lead Implant Date: 20090818
Implantable Lead Implant Date: 20090818
Implantable Lead Location: 753859
Implantable Lead Model: 5076
Lead Channel Impedance Value: 394 Ohm
Lead Channel Impedance Value: 503 Ohm
Lead Channel Pacing Threshold Amplitude: 0.625 V
Lead Channel Pacing Threshold Amplitude: 1 V
Lead Channel Pacing Threshold Pulse Width: 0.4 ms
Lead Channel Pacing Threshold Pulse Width: 0.4 ms
Lead Channel Setting Pacing Amplitude: 2 V
Lead Channel Setting Sensing Sensitivity: 4 mV
MDC IDC LEAD LOCATION: 753860
MDC IDC MSMT LEADCHNL RA PACING THRESHOLD AMPLITUDE: 0.5 V
MDC IDC MSMT LEADCHNL RA SENSING INTR AMPL: 2 mV
MDC IDC MSMT LEADCHNL RV PACING THRESHOLD AMPLITUDE: 1 V
MDC IDC MSMT LEADCHNL RV PACING THRESHOLD PULSEWIDTH: 0.4 ms
MDC IDC MSMT LEADCHNL RV PACING THRESHOLD PULSEWIDTH: 0.4 ms
MDC IDC MSMT LEADCHNL RV SENSING INTR AMPL: 8 mV
MDC IDC PG IMPLANT DT: 20090818
MDC IDC SET LEADCHNL RV PACING AMPLITUDE: 2.5 V
MDC IDC SET LEADCHNL RV PACING PULSEWIDTH: 0.4 ms
MDC IDC STAT BRADY AP VP PERCENT: 5 %
MDC IDC STAT BRADY AP VS PERCENT: 1 %
MDC IDC STAT BRADY AS VP PERCENT: 2 %
MDC IDC STAT BRADY AS VS PERCENT: 92 %

## 2017-11-05 ENCOUNTER — Ambulatory Visit (INDEPENDENT_AMBULATORY_CARE_PROVIDER_SITE_OTHER): Payer: Self-pay | Admitting: *Deleted

## 2017-11-05 DIAGNOSIS — I442 Atrioventricular block, complete: Secondary | ICD-10-CM

## 2017-11-05 NOTE — Progress Notes (Signed)
Remote pacemaker transmission.   

## 2017-11-06 LAB — CUP PACEART REMOTE DEVICE CHECK
Brady Statistic AP VP Percent: 7 %
Brady Statistic AS VS Percent: 90 %
Date Time Interrogation Session: 20181119120008
Implantable Lead Implant Date: 20090818
Implantable Lead Location: 753859
Lead Channel Impedance Value: 365 Ohm
Lead Channel Impedance Value: 504 Ohm
Lead Channel Pacing Threshold Amplitude: 0.875 V
Lead Channel Setting Sensing Sensitivity: 2.8 mV
MDC IDC LEAD IMPLANT DT: 20090818
MDC IDC LEAD LOCATION: 753860
MDC IDC MSMT BATTERY IMPEDANCE: 4320 Ohm
MDC IDC MSMT BATTERY REMAINING LONGEVITY: 11 mo
MDC IDC MSMT BATTERY VOLTAGE: 2.69 V
MDC IDC MSMT LEADCHNL RA PACING THRESHOLD AMPLITUDE: 0.625 V
MDC IDC MSMT LEADCHNL RA PACING THRESHOLD PULSEWIDTH: 0.4 ms
MDC IDC MSMT LEADCHNL RV PACING THRESHOLD PULSEWIDTH: 0.4 ms
MDC IDC PG IMPLANT DT: 20090818
MDC IDC SET LEADCHNL RA PACING AMPLITUDE: 2 V
MDC IDC SET LEADCHNL RV PACING AMPLITUDE: 2.5 V
MDC IDC SET LEADCHNL RV PACING PULSEWIDTH: 0.4 ms
MDC IDC STAT BRADY AP VS PERCENT: 2 %
MDC IDC STAT BRADY AS VP PERCENT: 2 %

## 2017-11-15 ENCOUNTER — Encounter: Payer: Self-pay | Admitting: Cardiology

## 2017-12-06 ENCOUNTER — Ambulatory Visit (INDEPENDENT_AMBULATORY_CARE_PROVIDER_SITE_OTHER): Payer: Self-pay | Admitting: *Deleted

## 2017-12-06 DIAGNOSIS — I442 Atrioventricular block, complete: Secondary | ICD-10-CM

## 2017-12-06 NOTE — Progress Notes (Signed)
Remote pacemaker transmission.   

## 2017-12-07 ENCOUNTER — Encounter: Payer: Self-pay | Admitting: Cardiology

## 2017-12-10 ENCOUNTER — Other Ambulatory Visit: Payer: Self-pay | Admitting: Cardiology

## 2017-12-13 ENCOUNTER — Other Ambulatory Visit: Payer: Self-pay | Admitting: Internal Medicine

## 2017-12-13 MED ORDER — POTASSIUM CHLORIDE CRYS ER 10 MEQ PO TBCR: 20.0000 meq | EXTENDED_RELEASE_TABLET | Freq: Every day | ORAL | 2 refills | Status: DC

## 2017-12-19 LAB — CUP PACEART REMOTE DEVICE CHECK
Battery Impedance: 4494 Ohm
Brady Statistic AP VP Percent: 8 %
Brady Statistic AS VP Percent: 2 %
Brady Statistic AS VS Percent: 88 %
Implantable Lead Implant Date: 20090818
Implantable Lead Location: 753860
Implantable Lead Model: 5076
Implantable Lead Model: 5076
Lead Channel Impedance Value: 393 Ohm
Lead Channel Impedance Value: 480 Ohm
Lead Channel Pacing Threshold Amplitude: 0.5 V
Lead Channel Pacing Threshold Amplitude: 1 V
Lead Channel Pacing Threshold Pulse Width: 0.4 ms
Lead Channel Setting Pacing Amplitude: 2.5 V
MDC IDC LEAD IMPLANT DT: 20090818
MDC IDC LEAD LOCATION: 753859
MDC IDC MSMT BATTERY REMAINING LONGEVITY: 10 mo
MDC IDC MSMT BATTERY VOLTAGE: 2.68 V
MDC IDC MSMT LEADCHNL RV PACING THRESHOLD PULSEWIDTH: 0.4 ms
MDC IDC PG IMPLANT DT: 20090818
MDC IDC SESS DTM: 20181220123718
MDC IDC SET LEADCHNL RA PACING AMPLITUDE: 2 V
MDC IDC SET LEADCHNL RV PACING PULSEWIDTH: 0.4 ms
MDC IDC SET LEADCHNL RV SENSING SENSITIVITY: 2.8 mV
MDC IDC STAT BRADY AP VS PERCENT: 2 %

## 2018-01-07 ENCOUNTER — Ambulatory Visit (INDEPENDENT_AMBULATORY_CARE_PROVIDER_SITE_OTHER): Payer: Medicare Other | Admitting: *Deleted

## 2018-01-07 DIAGNOSIS — I442 Atrioventricular block, complete: Secondary | ICD-10-CM

## 2018-01-07 NOTE — Progress Notes (Signed)
Remote pacemaker transmission.   

## 2018-01-08 ENCOUNTER — Encounter: Payer: Self-pay | Admitting: Cardiology

## 2018-01-08 LAB — CUP PACEART REMOTE DEVICE CHECK
Battery Impedance: 4649 Ohm
Battery Remaining Longevity: 9 mo
Brady Statistic AP VP Percent: 9 %
Brady Statistic AS VP Percent: 2 %
Implantable Lead Implant Date: 20090818
Implantable Lead Location: 753860
Implantable Lead Model: 5076
Implantable Pulse Generator Implant Date: 20090818
Lead Channel Impedance Value: 411 Ohm
Lead Channel Pacing Threshold Amplitude: 1.125 V
Lead Channel Setting Pacing Amplitude: 2 V
Lead Channel Setting Pacing Amplitude: 2.5 V
Lead Channel Setting Sensing Sensitivity: 2.8 mV
MDC IDC LEAD IMPLANT DT: 20090818
MDC IDC LEAD LOCATION: 753859
MDC IDC MSMT BATTERY VOLTAGE: 2.7 V
MDC IDC MSMT LEADCHNL RA PACING THRESHOLD AMPLITUDE: 0.5 V
MDC IDC MSMT LEADCHNL RA PACING THRESHOLD PULSEWIDTH: 0.4 ms
MDC IDC MSMT LEADCHNL RV IMPEDANCE VALUE: 496 Ohm
MDC IDC MSMT LEADCHNL RV PACING THRESHOLD PULSEWIDTH: 0.4 ms
MDC IDC SESS DTM: 20190121123502
MDC IDC SET LEADCHNL RV PACING PULSEWIDTH: 0.4 ms
MDC IDC STAT BRADY AP VS PERCENT: 2 %
MDC IDC STAT BRADY AS VS PERCENT: 88 %

## 2018-01-15 ENCOUNTER — Other Ambulatory Visit: Payer: Self-pay | Admitting: Ophthalmology

## 2018-02-07 ENCOUNTER — Encounter: Payer: Self-pay | Admitting: Cardiology

## 2018-02-07 ENCOUNTER — Telehealth: Payer: Self-pay | Admitting: Cardiology

## 2018-02-07 ENCOUNTER — Other Ambulatory Visit: Payer: Self-pay | Admitting: Family Medicine

## 2018-02-07 ENCOUNTER — Ambulatory Visit (INDEPENDENT_AMBULATORY_CARE_PROVIDER_SITE_OTHER): Payer: Self-pay | Admitting: *Deleted

## 2018-02-07 DIAGNOSIS — M5416 Radiculopathy, lumbar region: Secondary | ICD-10-CM

## 2018-02-07 DIAGNOSIS — I442 Atrioventricular block, complete: Secondary | ICD-10-CM

## 2018-02-07 NOTE — Progress Notes (Signed)
Remote pacemaker transmission.   

## 2018-02-07 NOTE — Telephone Encounter (Signed)
Spoke with pt and reminded pt of remote transmission that is due today. Pt verbalized understanding.   

## 2018-02-11 ENCOUNTER — Telehealth: Payer: Self-pay | Admitting: Internal Medicine

## 2018-02-11 LAB — CUP PACEART REMOTE DEVICE CHECK
Battery Voltage: 2.69 V
Brady Statistic AP VP Percent: 9 %
Brady Statistic AP VS Percent: 2 %
Brady Statistic AS VS Percent: 88 %
Date Time Interrogation Session: 20190221164310
Implantable Lead Implant Date: 20090818
Implantable Lead Location: 753859
Lead Channel Impedance Value: 419 Ohm
Lead Channel Pacing Threshold Amplitude: 1 V
Lead Channel Pacing Threshold Pulse Width: 0.4 ms
Lead Channel Pacing Threshold Pulse Width: 0.4 ms
Lead Channel Setting Pacing Pulse Width: 0.4 ms
MDC IDC LEAD IMPLANT DT: 20090818
MDC IDC LEAD LOCATION: 753860
MDC IDC MSMT BATTERY IMPEDANCE: 4579 Ohm
MDC IDC MSMT BATTERY REMAINING LONGEVITY: 10 mo
MDC IDC MSMT LEADCHNL RA PACING THRESHOLD AMPLITUDE: 0.5 V
MDC IDC MSMT LEADCHNL RV IMPEDANCE VALUE: 515 Ohm
MDC IDC PG IMPLANT DT: 20090818
MDC IDC SET LEADCHNL RA PACING AMPLITUDE: 2 V
MDC IDC SET LEADCHNL RV PACING AMPLITUDE: 2.5 V
MDC IDC SET LEADCHNL RV SENSING SENSITIVITY: 2.8 mV
MDC IDC STAT BRADY AS VP PERCENT: 1 %

## 2018-02-11 NOTE — Telephone Encounter (Signed)
Spoke with Morey Hummingbird in radiology who confirmed that she was expecting this information.

## 2018-02-11 NOTE — Telephone Encounter (Signed)
°  1. Has your device fired? no  2. Is you device beeping? no  3. Are you experiencing draining or swelling at device site?no  4. Are you calling to see if we received your device transmission? no  5. Have you passed out? No  Patient needs to know make and model information for her pacemaker    Please route to [place]

## 2018-02-11 NOTE — Telephone Encounter (Signed)
Spoke with patient who was requesting that her device information be faxed to radiology for MRI pre-approval. Fax number [phone] phone number for radiology [phone].

## 2018-03-11 ENCOUNTER — Ambulatory Visit (INDEPENDENT_AMBULATORY_CARE_PROVIDER_SITE_OTHER): Payer: Self-pay | Admitting: *Deleted

## 2018-03-11 DIAGNOSIS — I442 Atrioventricular block, complete: Secondary | ICD-10-CM

## 2018-03-11 NOTE — Progress Notes (Signed)
Remote pacemaker transmission.   

## 2018-03-12 ENCOUNTER — Encounter: Payer: Self-pay | Admitting: Cardiology

## 2018-03-12 LAB — CUP PACEART REMOTE DEVICE CHECK
Battery Voltage: 2.69 V
Brady Statistic AP VS Percent: 2 %
Brady Statistic AS VP Percent: 1 %
Brady Statistic AS VS Percent: 86 %
Date Time Interrogation Session: 20190325105315
Implantable Lead Location: 753859
Implantable Lead Location: 753860
Lead Channel Impedance Value: 460 Ohm
Lead Channel Pacing Threshold Amplitude: 0.5 V
Lead Channel Pacing Threshold Pulse Width: 0.4 ms
Lead Channel Pacing Threshold Pulse Width: 0.4 ms
Lead Channel Setting Pacing Pulse Width: 0.4 ms
Lead Channel Setting Sensing Sensitivity: 2 mV
MDC IDC LEAD IMPLANT DT: 20090818
MDC IDC LEAD IMPLANT DT: 20090818
MDC IDC MSMT BATTERY IMPEDANCE: 4830 Ohm
MDC IDC MSMT BATTERY REMAINING LONGEVITY: 7 mo
MDC IDC MSMT LEADCHNL RA IMPEDANCE VALUE: 362 Ohm
MDC IDC MSMT LEADCHNL RV PACING THRESHOLD AMPLITUDE: 1.125 V
MDC IDC PG IMPLANT DT: 20090818
MDC IDC SET LEADCHNL RA PACING AMPLITUDE: 2 V
MDC IDC SET LEADCHNL RV PACING AMPLITUDE: 2.5 V
MDC IDC STAT BRADY AP VP PERCENT: 11 %

## 2018-03-15 ENCOUNTER — Other Ambulatory Visit: Payer: Self-pay | Admitting: Family Medicine

## 2018-03-15 DIAGNOSIS — M5416 Radiculopathy, lumbar region: Secondary | ICD-10-CM

## 2018-04-11 ENCOUNTER — Ambulatory Visit (INDEPENDENT_AMBULATORY_CARE_PROVIDER_SITE_OTHER): Payer: Medicare Other | Admitting: *Deleted

## 2018-04-11 DIAGNOSIS — I442 Atrioventricular block, complete: Secondary | ICD-10-CM

## 2018-04-12 ENCOUNTER — Encounter: Payer: Self-pay | Admitting: Cardiology

## 2018-04-12 NOTE — Progress Notes (Signed)
Remote pacemaker transmission.   

## 2018-05-03 LAB — CUP PACEART REMOTE DEVICE CHECK
Battery Impedance: 5214 Ohm
Battery Voltage: 2.68 V
Brady Statistic AP VS Percent: 2 %
Brady Statistic AS VP Percent: 1 %
Date Time Interrogation Session: 20190425113806
Implantable Lead Location: 753859
Implantable Lead Location: 753860
Implantable Lead Model: 5076
Implantable Pulse Generator Implant Date: 20090818
Lead Channel Pacing Threshold Amplitude: 0.5 V
Lead Channel Pacing Threshold Amplitude: 1 V
Lead Channel Pacing Threshold Pulse Width: 0.4 ms
Lead Channel Pacing Threshold Pulse Width: 0.4 ms
Lead Channel Setting Pacing Amplitude: 2.5 V
Lead Channel Setting Pacing Pulse Width: 0.4 ms
MDC IDC LEAD IMPLANT DT: 20090818
MDC IDC LEAD IMPLANT DT: 20090818
MDC IDC MSMT BATTERY REMAINING LONGEVITY: 5 mo
MDC IDC MSMT LEADCHNL RA IMPEDANCE VALUE: 402 Ohm
MDC IDC MSMT LEADCHNL RV IMPEDANCE VALUE: 490 Ohm
MDC IDC SET LEADCHNL RA PACING AMPLITUDE: 2 V
MDC IDC SET LEADCHNL RV SENSING SENSITIVITY: 2.8 mV
MDC IDC STAT BRADY AP VP PERCENT: 11 %
MDC IDC STAT BRADY AS VS PERCENT: 86 %

## 2018-05-14 ENCOUNTER — Ambulatory Visit (INDEPENDENT_AMBULATORY_CARE_PROVIDER_SITE_OTHER): Payer: Self-pay | Admitting: *Deleted

## 2018-05-14 DIAGNOSIS — Z950 Presence of cardiac pacemaker: Secondary | ICD-10-CM

## 2018-05-14 NOTE — Progress Notes (Signed)
Remote pacemaker transmission.   

## 2018-05-16 LAB — CUP PACEART REMOTE DEVICE CHECK
Battery Remaining Longevity: 5 mo
Battery Voltage: 2.67 V
Brady Statistic AP VP Percent: 11 %
Brady Statistic AP VS Percent: 2 %
Brady Statistic AS VP Percent: 1 %
Implantable Lead Implant Date: 20090818
Implantable Lead Model: 5076
Implantable Lead Model: 5076
Lead Channel Impedance Value: 379 Ohm
Lead Channel Pacing Threshold Amplitude: 0.5 V
Lead Channel Pacing Threshold Pulse Width: 0.4 ms
Lead Channel Setting Pacing Amplitude: 2 V
Lead Channel Setting Pacing Pulse Width: 0.4 ms
MDC IDC LEAD IMPLANT DT: 20090818
MDC IDC LEAD LOCATION: 753859
MDC IDC LEAD LOCATION: 753860
MDC IDC MSMT BATTERY IMPEDANCE: 5264 Ohm
MDC IDC MSMT LEADCHNL RV IMPEDANCE VALUE: 488 Ohm
MDC IDC MSMT LEADCHNL RV PACING THRESHOLD AMPLITUDE: 1.125 V
MDC IDC MSMT LEADCHNL RV PACING THRESHOLD PULSEWIDTH: 0.4 ms
MDC IDC PG IMPLANT DT: 20090818
MDC IDC SESS DTM: 20190528101437
MDC IDC SET LEADCHNL RV PACING AMPLITUDE: 2.5 V
MDC IDC SET LEADCHNL RV SENSING SENSITIVITY: 2.8 mV
MDC IDC STAT BRADY AS VS PERCENT: 86 %

## 2018-05-17 ENCOUNTER — Encounter: Payer: Self-pay | Admitting: Cardiology

## 2018-06-03 ENCOUNTER — Encounter: Payer: Self-pay | Admitting: Neurology

## 2018-06-14 ENCOUNTER — Telehealth: Payer: Self-pay | Admitting: Internal Medicine

## 2018-06-14 ENCOUNTER — Ambulatory Visit (INDEPENDENT_AMBULATORY_CARE_PROVIDER_SITE_OTHER): Payer: Medicare Other | Admitting: *Deleted

## 2018-06-14 DIAGNOSIS — I5042 Chronic combined systolic (congestive) and diastolic (congestive) heart failure: Secondary | ICD-10-CM

## 2018-06-14 DIAGNOSIS — I442 Atrioventricular block, complete: Secondary | ICD-10-CM | POA: Diagnosis not present

## 2018-06-14 LAB — CUP PACEART REMOTE DEVICE CHECK
Battery Impedance: 5281 Ohm
Battery Voltage: 2.68 V
Brady Statistic AP VS Percent: 2 %
Date Time Interrogation Session: 20190628105212
Implantable Lead Implant Date: 20090818
Implantable Lead Location: 753860
Implantable Lead Model: 5076
Implantable Lead Model: 5076
Implantable Pulse Generator Implant Date: 20090818
Lead Channel Pacing Threshold Pulse Width: 0.4 ms
Lead Channel Setting Pacing Amplitude: 2.5 V
Lead Channel Setting Pacing Pulse Width: 0.4 ms
MDC IDC LEAD IMPLANT DT: 20090818
MDC IDC LEAD LOCATION: 753859
MDC IDC MSMT BATTERY REMAINING LONGEVITY: 4 mo
MDC IDC MSMT LEADCHNL RA IMPEDANCE VALUE: 372 Ohm
MDC IDC MSMT LEADCHNL RA PACING THRESHOLD AMPLITUDE: 0.5 V
MDC IDC MSMT LEADCHNL RV IMPEDANCE VALUE: 467 Ohm
MDC IDC MSMT LEADCHNL RV PACING THRESHOLD AMPLITUDE: 1 V
MDC IDC MSMT LEADCHNL RV PACING THRESHOLD PULSEWIDTH: 0.4 ms
MDC IDC SET LEADCHNL RA PACING AMPLITUDE: 2 V
MDC IDC SET LEADCHNL RV SENSING SENSITIVITY: 2.8 mV
MDC IDC STAT BRADY AP VP PERCENT: 11 %
MDC IDC STAT BRADY AS VP PERCENT: 1 %
MDC IDC STAT BRADY AS VS PERCENT: 86 %

## 2018-06-14 NOTE — Progress Notes (Signed)
Remote pacemaker transmission.   

## 2018-06-14 NOTE — Telephone Encounter (Signed)
New Message    Pt c/o swelling: STAT is pt has developed SOB within 24 hours  1) How much weight have you gained and in what time span? 5.4lbs in 7 days   If swelling, where is the swelling located? No swelling  2) Are you currently taking a fluid pill? yes  3) Are you currently SOB? no  4) Do you have a log of your daily weights (if so, list)? 06/20 211.8, 6/28 217.2  5) Have you gained 3 pounds in a day or 5 pounds in a week?  6) Have you traveled recently? No     Magda Paganini with [facility] is calling on behalf of patient. Patient has a none productive cough with shortness of breath

## 2018-06-14 NOTE — Telephone Encounter (Signed)
Returned call to Pt.  Advised per Dr.Taylor to increase her lasix to 40 mg twice a day. Advised Pt that Dr. Lovena Le would like Pt to have a BMP after 2 weeks.  Pt will ask [facility] on Monday if they are able to draw labs.  If not she will call back.

## 2018-06-18 ENCOUNTER — Other Ambulatory Visit: Payer: Self-pay | Admitting: Hematology & Oncology

## 2018-06-26 ENCOUNTER — Telehealth: Payer: Self-pay | Admitting: Internal Medicine

## 2018-06-26 NOTE — Telephone Encounter (Signed)
Left CVM for Pt per request.  Advised Pt it was just an error that Dr. Caryl Comes was listed as her recall doctor.  Advised Pt we know she needs to see Dr. Lovena Le. Advised Pt a message had been sent to scheduling to set up her yearly appt with Dr. Lovena Le.  Asked Pt to call office if she needs her BMET drawn here vs. At her place of residence.

## 2018-06-26 NOTE — Telephone Encounter (Signed)
New Message:     Pt said she received her Recall Letter saying she needs an appointment with Dr Caryl Comes.She did not understand why it said Dr Caryl Comes, she have never seen him before. Her question is does she need this appointment or should she see Dr Lovena Le? If she is not there, please leave her message.

## 2018-06-28 NOTE — Telephone Encounter (Signed)
Labs scheduled  

## 2018-06-28 NOTE — Telephone Encounter (Signed)
Pt indicated to scheduler that she would need blood work at office.  Called placed to Pt.  Went to VM.  Advised this nurse would attempt to call her again on Monday.

## 2018-07-02 ENCOUNTER — Other Ambulatory Visit: Payer: Medicare Other

## 2018-07-02 DIAGNOSIS — I5042 Chronic combined systolic (congestive) and diastolic (congestive) heart failure: Secondary | ICD-10-CM

## 2018-07-02 LAB — BASIC METABOLIC PANEL
BUN/Creatinine Ratio: 10 — ABNORMAL LOW (ref 12–28)
BUN: 12 mg/dL (ref 8–27)
CALCIUM: 9.6 mg/dL (ref 8.7–10.3)
CHLORIDE: 100 mmol/L (ref 96–106)
CO2: 25 mmol/L (ref 20–29)
Creatinine, Ser: 1.17 mg/dL — ABNORMAL HIGH (ref 0.57–1.00)
GFR calc non Af Amer: 44 mL/min/{1.73_m2} — ABNORMAL LOW (ref >59)
GFR, EST AFRICAN AMERICAN: 51 mL/min/{1.73_m2} — AB (ref >59)
Glucose: 104 mg/dL — ABNORMAL HIGH (ref 65–99)
POTASSIUM: 4.1 mmol/L (ref 3.5–5.2)
Sodium: 141 mmol/L (ref 134–144)

## 2018-07-08 ENCOUNTER — Other Ambulatory Visit: Payer: Self-pay | Admitting: Internal Medicine

## 2018-07-10 ENCOUNTER — Telehealth: Payer: Self-pay | Admitting: Internal Medicine

## 2018-07-10 NOTE — Telephone Encounter (Signed)
Spoke with patient about her SOB. It started 3 days ago and has not worsened. Patient was not currently short of breath, but said this morning she was doing a chair exercises and felt SOB. The patient went to relax and this relieves the SOB symptoms. The patient denies any swelling. Her other symptom was a generalized bloating feeling in her stomach. Her weight today was 217 lbs and she listed off other weights for the week. She gained 1 lb in a day and her weight fluctuates regularly. She also said that she has had back pain for the past 3 days as well. She confirmed that she is taking her lasix and said the dose had been recently increased to 40 mg. Discussed with the patient if her symptoms worsen to call back.

## 2018-07-10 NOTE — Telephone Encounter (Signed)
New message   [employer] calling to report sob   Pt c/o Shortness Of Breath: STAT if SOB developed within the last 24 hours or pt is noticeably SOB on the phone  1. Are you currently SOB (can you hear that pt is SOB on the phone)? No, but does have with activity/walking   2. How long have you been experiencing SOB? Increased than normal, has had sob for 3 days  3. Are you SOB when sitting or when up moving around? When moving around  4. Are you currently experiencing any other symptoms? No   Over 1 lb weight gain in 1 day, weight fluctuates.  No swelling.     Please do not call the patient until after 10:30 am.

## 2018-07-11 ENCOUNTER — Telehealth: Payer: Self-pay | Admitting: Internal Medicine

## 2018-07-11 NOTE — Telephone Encounter (Signed)
noted 

## 2018-07-11 NOTE — Telephone Encounter (Signed)
Patient called back. Patient complained of SOB for 1 month. Patient stated it gets worse with activity. Patient reported that she is taking Lasix 40 mg BID. According to patient's medication list, patient is suppose to take Lasix 40 mg in the am and 20 mg in the pm. Patient stated when she had her physical therapy this morning that her BP was 145/98 and HR was 94. Patient reports taking her losartan and metoprolol. Patient also complaining of being weak and tired. Patient has called several times this month to the office. Found patient an appointment with Dr. Caryl Comes tomorrow, who is DOD tomorrow. Patient verbalize understanding and will come in to get evaluated.

## 2018-07-11 NOTE — Telephone Encounter (Signed)
New message  Pt c/o Shortness Of Breath: STAT if SOB developed within the last 24 hours or pt is noticeably SOB on the phone  1. Are you currently SOB (can you hear that pt is SOB on the phone)? Magda Paganini from [employer] is calling stating SOB for pt  2. How long have you been experiencing SOB? 5 days  3. Are you SOB when sitting or when up moving around? Moving around  4. Are you currently experiencing any other symptoms? Tired

## 2018-07-11 NOTE — Telephone Encounter (Signed)
Left message for patient to call back  

## 2018-07-12 ENCOUNTER — Ambulatory Visit (INDEPENDENT_AMBULATORY_CARE_PROVIDER_SITE_OTHER)
Admission: RE | Admit: 2018-07-12 | Discharge: 2018-07-12 | Disposition: A | Discharge status: Home / Self Care | Payer: Medicare Other | Source: Ambulatory Visit | Attending: Internal Medicine | Admitting: Internal Medicine

## 2018-07-12 ENCOUNTER — Ambulatory Visit (INDEPENDENT_AMBULATORY_CARE_PROVIDER_SITE_OTHER): Payer: Medicare Other | Admitting: Internal Medicine

## 2018-07-12 ENCOUNTER — Other Ambulatory Visit: Payer: Medicare Other | Admitting: *Deleted

## 2018-07-12 ENCOUNTER — Encounter: Payer: Self-pay | Admitting: Internal Medicine

## 2018-07-12 ENCOUNTER — Ambulatory Visit
Admission: RE | Admit: 2018-07-12 | Discharge: 2018-07-12 | Disposition: A | Discharge status: Home / Self Care | Payer: Medicare Other | Source: Ambulatory Visit | Attending: Internal Medicine | Admitting: Internal Medicine

## 2018-07-12 VITALS — BP 136/88 | HR 91 | Ht 62.0 in | Wt 215.2 lb

## 2018-07-12 DIAGNOSIS — R0602 Shortness of breath: Secondary | ICD-10-CM

## 2018-07-12 DIAGNOSIS — R296 Repeated falls: Secondary | ICD-10-CM

## 2018-07-12 DIAGNOSIS — I442 Atrioventricular block, complete: Secondary | ICD-10-CM | POA: Diagnosis not present

## 2018-07-12 LAB — CUP PACEART INCLINIC DEVICE CHECK
Battery Impedance: 5364 Ohm
Battery Remaining Longevity: 5 mo
Battery Voltage: 2.66 V
Brady Statistic AP VP Percent: 11 %
Brady Statistic AP VS Percent: 2 %
Brady Statistic AS VP Percent: 1 %
Brady Statistic AS VS Percent: 86 %
Date Time Interrogation Session: 20190726114512
Implantable Lead Implant Date: 20090818
Implantable Lead Implant Date: 20090818
Implantable Lead Location: 753859
Implantable Lead Location: 753860
Implantable Lead Model: 5076
Implantable Lead Model: 5076
Implantable Pulse Generator Implant Date: 20090818
Lead Channel Impedance Value: 388 Ohm
Lead Channel Impedance Value: 496 Ohm
Lead Channel Pacing Threshold Amplitude: 0.5 V
Lead Channel Pacing Threshold Amplitude: 1.125 V
Lead Channel Pacing Threshold Pulse Width: 0.4 ms
Lead Channel Pacing Threshold Pulse Width: 0.4 ms
Lead Channel Setting Pacing Amplitude: 2 V
Lead Channel Setting Pacing Amplitude: 2.5 V
Lead Channel Setting Pacing Pulse Width: 0.4 ms
Lead Channel Setting Sensing Sensitivity: 2.8 mV

## 2018-07-12 LAB — BASIC METABOLIC PANEL
BUN/Creatinine Ratio: 18 (ref 12–28)
BUN: 19 mg/dL (ref 8–27)
CO2: 25 mmol/L (ref 20–29)
Calcium: 9.3 mg/dL (ref 8.7–10.3)
Chloride: 102 mmol/L (ref 96–106)
Creatinine, Ser: 1.04 mg/dL — ABNORMAL HIGH (ref 0.57–1.00)
GFR calc Af Amer: 59 mL/min/{1.73_m2} — ABNORMAL LOW (ref >59)
GFR calc non Af Amer: 51 mL/min/{1.73_m2} — ABNORMAL LOW (ref >59)
Glucose: 86 mg/dL (ref 65–99)
Potassium: 4.2 mmol/L (ref 3.5–5.2)
Sodium: 142 mmol/L (ref 134–144)

## 2018-07-12 NOTE — Progress Notes (Signed)
Patient Care Team: Maurice Small, MD as PCP - General (Family Medicine)   HPI  [name] [name] is a 82 y.o. female seen as an add-on because of progressive shortness of breath.  She took a recent trip and since her return is had worsening shortness of breath.  Her furosemide was increased last week with a improved urine response but shortness of breath is continued to worsen.  She denies peripheral edema but has had some abdominal distention.  She has a history of atrial fibrillation and prior stroke and is on oral anticoagulants.  She also has had a series of falls.  They have come without warning.  1 of these she has fallen and hit her head.  She ended up with a knot.  She has chronic headaches.   She has a history of complete heart block with previously implanted Medtronic pacemaker.    DATE TEST EF   8/17 Echo   45-50 %   8/17  RHC   Normal         Date Cr  K Hgb  7/19 1.17 4.5          O she is a patient related She has 2 complaints this morning.  Records and Results Reviewed   Past Medical History:  Diagnosis Date  . Allergic rhinitis   . Anxiety   . Cardiac pacemaker in situ    DOI  08-04-2008  medtronic dual chamber  . Chronic anticoagulation   . Chronic combined systolic and diastolic CHF, NYHA class 2 ([place])    montior by cardiologist-- dr taylor--  ef 45%  . Coronary artery disease CARDIOLOGIST-- DR Cristopher Peru   per cath-- Non-obstructive minimal CAD  . Fluid retention   . GERD (gastroesophageal reflux disease)   . History of cervical dysplasia    CIN III  . History of CVA (cerebrovascular accident)    09-15-2005  Left Cerebellar Hematoma with mild stem compression whild on coumadin for DVT  . History of DVT of lower extremity   . History of non-ST elevation myocardial infarction (NSTEMI)    2011-- in setting of bilateral PE's and DVT's and CHF  . History of peptic ulcer disease   . History of pulmonary embolus (PE)   . Hyperlipidemia   .  Hypertension   . Incontinence of urine   . LBBB (left bundle branch block)   . Mild obstructive sleep apnea    PT USES Humidied Nasal Mask with O2 at2.5L (study 08-05-2009)  . Mild pulmonary hypertension ([place])   . Moderate intermittent asthma   . OA (osteoarthritis)   . PAF (paroxysmal atrial fibrillation) ([place])   . Renal insufficiency   . Sarcoidosis   . Shortness of breath    OCCASIONAL  . Thrombophilia ([place])    hemotologist--  dr Marin Olp--  monitors pt anitcoagulation  . Urgency of urination   . Venous insufficiency   . VIN III (vulvar intraepithelial neoplasia III)     Past Surgical History:  Procedure Laterality Date  . ABDOMINAL HYSTERECTOMY  1970's  . APPENDECTOMY  1966  . CARDIAC CATHETERIZATION  08-26-2003   dr Lorenz Coaster. non-obstructive CAD & LVD with ef 46%, global HK, 2+MR, PA press 50/24 w/ wedge 20  . CARDIAC CATHETERIZATION  08-03-2008   dr Angelena Form   Non-obstructive CAD/  30-40% pLAD,  30% ostial CFX/  symptomatic bradycardia/  normal renal arteries  . CARDIAC CATHETERIZATION N/A 07/21/2016   Procedure: Right Heart  Cath;  Surgeon: Larey Dresser, MD;  Location: [facility];  Service: Cardiovascular;  Laterality: N/A;  . CARDIAC PACEMAKER PLACEMENT  08-04-2008  dr Lovena Le gregg   for CHB--  MEDTRONIC DUAL CHAMBER  . CATARACTS REMOVED    . CHOLECYSTECTOMY  01/1996  . DECCOMPRESSION ULNAR NERVE AT CUBITAL TUNNEL AND RESECTION MUSCLE Left 03-17-2008  . EXCISION LEFT SUPRACLAVICULAR LYMPH NODE   04-22-2007   granulomatous inflammation, extensive  . INSERTION OF VENA CAVA FILTER  08/ 2011  . KNEE ARTHROSCOPY     LEFT  . LUMBAR [place] SURGERY  x2   1990  . SHOULDER ARTHROSCOPY WITH ROTATOR CUFF REPAIR AND SUBACROMIAL DECOMPRESSION Right 02-08-2009  . TOTAL KNEE ARTHROPLASTY Left 04/21/2013   Procedure: LEFT TOTAL KNEE ARTHROPLASTY;  Surgeon: Gearlean Alf, MD;  [facility];  Service: Orthopedics;  Laterality: Left;  . TOTAL KNEE ARTHROPLASTY Right  04/03/2016   Procedure: RIGHT TOTAL KNEE ARTHROPLASTY;  Surgeon: Gaynelle Arabian, MD;  [facility];  Service: Orthopedics;  Laterality: Right;  . TRANSTHORACIC ECHOCARDIOGRAM  last one 05-19-2014   mild LVH/  ef 45-50%/  septal-lateral dyssynchroncy consistant with LBBB/  diffuse hypokinesis/  grade I diastolic dysfunction/  trivial MR and TR/  mild LAE  . TUBAL LIGATION    . VULVAR LESION REMOVAL  07/16/2012   Procedure: VULVAR LESION;  Surgeon: Imagene Gurney A. Alycia Rossetti, MD;  [facility];  Service: Gynecology;  Laterality: Right;  Wide Local Excision of the Vulvar  . VULVECTOMY N/A 02/19/2015   Procedure: SIMPLE PARTIAL VULVECTOMY;  Surgeon: [name], MD;  Location[facility];  Service: Gynecology;  Laterality: N/A;    Current Meds  Medication Sig  . acetaminophen (TYLENOL) 500 MG tablet Take 500 mg by mouth every 6 (six) hours as needed for mild pain.  . Azelastine HCl 0.15 % SOLN USE 1 TO 2 SPRAYS IN EACH  NOSTRIL DAILY  . budesonide-formoterol (SYMBICORT) 160-4.5 MCG/ACT inhaler Inhale 2 puffs into the lungs 2 (two) times daily.  . calcium-vitamin D (OSCAL WITH D) 500-200 MG-UNIT tablet Take 1 tablet by mouth daily.  . cetirizine (ZYRTEC) 10 MG tablet Take 10 mg by mouth every morning.   . clorazepate (TRANXENE) 7.5 MG tablet Take 1 tablet (7.5 mg total) by mouth 3 (three) times daily.  . fluticasone (FLOVENT HFA) 44 MCG/ACT inhaler Inhale 2 puffs into the lungs 2 (two) times daily.  . furosemide (LASIX) 40 MG tablet Take 80 mg by mouth 2 (two) times daily.  [name] Kitchen ipratropium-albuterol (DUONEB) 0.5-2.5 (3) MG/3ML SOLN Take 3 mLs by nebulization 2 (two) times daily.  [name] Kitchen losartan (COZAAR) 25 MG tablet Take 25 mg by mouth daily.   . magic mouthwash SOLN Swish and swallow 5 ml 4 times daily if needed  . metoprolol succinate (TOPROL-XL) 25 MG 24 hr tablet Take 1 tablet by mouth  daily before breakfast  . OVER THE COUNTER MEDICATION Take 1 capsule by mouth daily.  . pantoprazole  (PROTONIX) 40 MG tablet Take 1 tablet by mouth  twice a day  . Polyethyl Glycol-Propyl Glycol (SYSTANE) 0.4-0.3 % SOLN Place 2 drops into both eyes 4 (four) times daily.   . potassium chloride (K-DUR,KLOR-CON) 10 MEQ tablet Take 2 tablets (20 mEq total) by mouth daily. Please keep upcoming appt in October for future refills. Thank you  . pravastatin (PRAVACHOL) 20 MG tablet Take 20 mg by mouth daily.  . sennosides-docusate sodium (SENOKOT-S) 8.6-50 MG tablet Take 2 tablets by mouth  at bedtime.   Alveda Reasons 20 MG TABS tablet TAKE 1 TABLET BY MOUTH  DAILY    Allergies  Allergen Reactions  . Cephalexin Other (See Comments)    diarrhea  . Imitrex [Sumatriptan] Other (See Comments)    Reaction unknown  . Sulfa Antibiotics Nausea Only    Doesn't like to drink lots of water.      Review of Systems negative except from HPI and PMH  Physical Exam BP 136/88   Pulse 91   Ht 5\' 2"  (1.575 m)   Wt 215 lb 3.2 oz (97.6 kg)   SpO2 93%   BMI 39.36 kg/m  Well developed and well nourished in no acute distress HENT normal E scleral and icterus clear Neck Supple JVP 7-8 cm; carotids brisk and full Clear to ausculation Regular rate and rhythm, no murmurs gallops or rub Soft with active bowel sounds No clubbing cyanosis Trace Edema Alert and oriented, grossly normal motor and sensory function Skin Warm and Dry  Sinus at 91 Intervals 18/15/40 QRS is negative lead I.  Negative and aVL.  This is distinct from 10/18 suspect limb lead   Assessment and  Plan  Shortness of breath with mild volume overload worsening despite diuretics  Recent car trip  Falls with head trauma  Pacemaker-Medtronic approaching ERI    The patient's pacemaker function is normal.  She is mildly volume overloaded.  It is most likely that this is the cause of her dyspnea although the fact that she is not improved despite increasing diuretics which have been associated with good diuresis is concerning.  It raises the  possibility of other issues including pulmonary embolism which should be at low risk given her anticoagulation.  We will check a d-dimer.  We will also check a chest x-ray.  Is been 2 years that she had an echo we will check an echocardiogram.  We will measure BNP  We will increase her Lasix from 40 twice daily--80 twice daily x3 days  Given her fall with her anticoagulation" not on her head "we will undertake a CT scan to make sure there is no intracranial bleeding   More than 50% of 45 min was spent in counseling related to the above        Current medicines are reviewed at length with the patient today .  The patient does not  have concerns regarding medicines.

## 2018-07-12 NOTE — Patient Instructions (Addendum)
Medication Instructions:   Your physician has recommended you make the following change in your medication:   1. Increase your lasix to 80mg , 2 tablets, two times per day for 3 days  Labwork: You will have labs drawn today: BNP, BMP, D Dimer and CBC   Testing/Procedures:  CT-scan of the brain chest X-ray echocardiogram  Non-Cardiac CT scanning, (CAT scanning), is a noninvasive, special x-ray that produces cross-sectional images of the body using x-rays and a computer. CT scans help physicians diagnose and treat medical conditions. For some CT exams, a contrast material is used to enhance visibility in the area of the body being studied. CT scans provide greater clarity and reveal more details than regular x-ray exams.   A chest x-ray takes a picture of the organs and structures inside the chest, including the heart, lungs, and blood vessels. This test can show several things, including, whether the heart is enlarges; whether fluid is building up in the lungs; and whether pacemaker / defibrillator leads are still in place.  Your physician has requested that you have an echocardiogram. Echocardiography is a painless test that uses sound waves to create images of your heart. It provides your doctor with information about the size and shape of your heart and how well your heart's chambers and valves are working. This procedure takes approximately one hour. There are no restrictions for this procedure.   Follow-Up: Your physician wants you to follow-up with Dr Lovena Le based off your test results.   Remote monitoring is used to monitor your Pacemaker from home. This monitoring reduces the number of office visits required to check your device to one time per year. It allows [place] to keep an eye on the functioning of your device to ensure it is working properly. You are scheduled for a device check from home on 08/12/2018. You may send your transmission at any time that day. If you have a wireless  device, the transmission will be sent automatically. After your physician reviews your transmission, you will receive a postcard with your next transmission date.    Any Other Special Instructions Will Be Listed Below (If Applicable).     If you need a refill on your cardiac medications before your next appointment, please call your pharmacy.

## 2018-07-13 LAB — D-DIMER, QUANTITATIVE: D-DIMER: 0.53 mg/L FEU — ABNORMAL HIGH (ref 0.00–0.49)

## 2018-07-15 ENCOUNTER — Telehealth: Payer: Self-pay | Admitting: Internal Medicine

## 2018-07-15 NOTE — Telephone Encounter (Signed)
Patient returning call from Nurse from Friday

## 2018-07-16 ENCOUNTER — Other Ambulatory Visit: Payer: Self-pay

## 2018-07-16 ENCOUNTER — Telehealth: Payer: Self-pay

## 2018-07-16 ENCOUNTER — Ambulatory Visit (HOSPITAL_COMMUNITY): Payer: Medicare Other | Attending: Cardiology

## 2018-07-16 DIAGNOSIS — I442 Atrioventricular block, complete: Secondary | ICD-10-CM | POA: Insufficient documentation

## 2018-07-16 DIAGNOSIS — R071 Chest pain on breathing: Secondary | ICD-10-CM

## 2018-07-16 DIAGNOSIS — R0602 Shortness of breath: Secondary | ICD-10-CM | POA: Diagnosis not present

## 2018-07-16 DIAGNOSIS — I119 Hypertensive heart disease without heart failure: Secondary | ICD-10-CM | POA: Diagnosis not present

## 2018-07-16 DIAGNOSIS — I071 Rheumatic tricuspid insufficiency: Secondary | ICD-10-CM | POA: Insufficient documentation

## 2018-07-16 DIAGNOSIS — I517 Cardiomegaly: Secondary | ICD-10-CM

## 2018-07-16 HISTORY — DX: Cardiomegaly: I51.7

## 2018-07-16 LAB — CBC
HEMATOCRIT: 38.8 % (ref 34.0–46.6)
HEMOGLOBIN: 13 g/dL (ref 11.1–15.9)
MCH: 29.6 pg (ref 26.6–33.0)
MCHC: 33.5 g/dL (ref 31.5–35.7)
MCV: 88 fL (ref 79–97)
Platelets: 164 10*3/uL (ref 150–450)
RBC: 4.39 x10E6/uL (ref 3.77–5.28)
RDW: 14.4 % (ref 12.3–15.4)
WBC: 6 10*3/uL (ref 3.4–10.8)

## 2018-07-16 LAB — PRO B NATRIURETIC PEPTIDE: NT-Pro BNP: 671 pg/mL (ref 0–738)

## 2018-07-16 MED ORDER — PERFLUTREN LIPID MICROSPHERE
1.0000 mL | INTRAVENOUS | Status: AC | PRN
  Administered 2018-07-16: 2 mL via INTRAVENOUS

## 2018-07-16 MED ORDER — FUROSEMIDE 40 MG PO TABS: 80.0000 mg | ORAL_TABLET | Freq: Two times a day (BID) | ORAL | 3 refills | Status: DC

## 2018-07-16 MED ORDER — FUROSEMIDE 40 MG PO TABS: 40.0000 mg | ORAL_TABLET | Freq: Two times a day (BID) | ORAL | 3 refills | Status: DC

## 2018-07-16 NOTE — Addendum Note (Signed)
Addended by: Derl Barrow on: 07/16/2018 02:27 PM   Modules accepted: Orders

## 2018-07-16 NOTE — Telephone Encounter (Signed)
-----   Message from Deboraha Sprang, MD sent at 07/13/2018  6:20 PM EDT ----- Please Inform Patient that labs are normal;  Ddimer just a touch above normal  BNP not yet bzck ## Thanks

## 2018-07-16 NOTE — Telephone Encounter (Signed)
Pt aware of test results and recommendations. She agrees to having a Chest CT to rule out PE. Radiology aware and will call to schedule.

## 2018-07-16 NOTE — Telephone Encounter (Signed)
Pt was presumably called by scheduling to remind Pt of ECHO today 07/16/2018.  No further action needed at this time.

## 2018-07-17 ENCOUNTER — Ambulatory Visit (INDEPENDENT_AMBULATORY_CARE_PROVIDER_SITE_OTHER)
Admission: RE | Admit: 2018-07-17 | Discharge: 2018-07-17 | Disposition: A | Discharge status: Home / Self Care | Payer: Medicare Other | Source: Ambulatory Visit | Attending: Internal Medicine | Admitting: Internal Medicine

## 2018-07-17 DIAGNOSIS — R071 Chest pain on breathing: Secondary | ICD-10-CM

## 2018-07-17 MED ORDER — IOPAMIDOL (ISOVUE-370) INJECTION 76%
80.0000 mL | Freq: Once | INTRAVENOUS | Status: AC | PRN
  Administered 2018-07-17: 80 mL via INTRAVENOUS

## 2018-07-24 ENCOUNTER — Telehealth: Payer: Self-pay | Admitting: Internal Medicine

## 2018-07-24 NOTE — Telephone Encounter (Signed)
New message  Patient needs ct scan results.  Please call.

## 2018-07-24 NOTE — Telephone Encounter (Signed)
For return call

## 2018-07-24 NOTE — Telephone Encounter (Signed)
Pt understands there was no acute PE noted on her Chest CT last week. No additional questions.

## 2018-07-25 ENCOUNTER — Encounter: Payer: Self-pay | Admitting: Neurology

## 2018-07-25 ENCOUNTER — Ambulatory Visit (INDEPENDENT_AMBULATORY_CARE_PROVIDER_SITE_OTHER): Payer: Medicare Other | Admitting: Neurology

## 2018-07-25 VITALS — BP 160/90 | HR 92 | Ht 62.0 in | Wt 213.4 lb

## 2018-07-25 DIAGNOSIS — G5711 Meralgia paresthetica, right lower limb: Secondary | ICD-10-CM

## 2018-07-25 NOTE — Patient Instructions (Addendum)
You can use a ointment or patch with LIDOCAINE such as Aspercream, IcyHot, or Salonpas. If this does not provide relief, please call my office and I can send a prescription strength medication.   Recommend weight loss  Return to clinic as needed

## 2018-07-25 NOTE — Progress Notes (Signed)
[facility] Clinic Note - Initial Visit   Date: 07/25/18  [name] [name] MRN: [id number] DOB: Apr 28, 1936   Dear Dr. Justin Mend:  Thank you for your kind referral of [name] [name] for consultation of right thigh pain. Although her history is well known to you, please allow [place] to reiterate it for the purpose of our medical record. The patient was accompanied to the clinic by self.   History of Present Illness: [name] MCGUIRT is a 82 y.o. right-handed Caucasian female with hypertension, hyperlipidemia, intermittent complete heart block s/p PPM, GERD, and history of left cerebellar ICH (2006) presenting for evaluation of right thigh pain.    Starting around April 2019, she began noticing burning pain over the side of the right thigh, which does not involve her medial thigh or past her knee.  Symptoms are intermittent, and worse at bedtime when she is laying to sleep.  Pain is lasts about 30 seconds and can recur.  She has some reduced sensation over the same region.  She has chronic low back pain or leg weakness.  No similar pain in the left thigh.  She has gained 30lb over the past 1.5 year, since she moved into assisted living facility.   Out-side paper records, electronic medical record, and images have been reviewed where available and summarized as:  CT head 07/12/2018: No acute abnormality. No change in mild cortical atrophy.   Lab Results  Component Value Date   TSH 0.94 05/04/2015     Past Medical History:  Diagnosis Date  . Allergic rhinitis   . Anxiety   . Cardiac pacemaker in situ    DOI  08-04-2008  medtronic dual chamber  . Chronic anticoagulation   . Chronic combined systolic and diastolic CHF, NYHA class 2 ([place])    [place] by cardiologist-- dr taylor--  ef 45%  . Coronary artery disease CARDIOLOGIST-- DR Cristopher Peru   per cath-- Non-obstructive minimal CAD  . Fluid retention   . GERD (gastroesophageal reflux disease)   . History of  cervical dysplasia    CIN III  . History of CVA (cerebrovascular accident)    09-15-2005  Left Cerebellar Hematoma with mild stem compression whild on coumadin for DVT  . History of DVT of lower extremity   . History of non-ST elevation myocardial infarction (NSTEMI)    2011-- in setting of bilateral PE's and DVT's and CHF  . History of peptic ulcer disease   . History of pulmonary embolus (PE)   . Hyperlipidemia   . Hypertension   . Incontinence of urine   . LBBB (left bundle branch block)   . Mild obstructive sleep apnea    PT USES Humidied Nasal Mask with O2 at2.5L (study 08-05-2009)  . Mild pulmonary hypertension ([place])   . Moderate intermittent asthma   . OA (osteoarthritis)   . PAF (paroxysmal atrial fibrillation) ([place])   . Renal insufficiency   . Sarcoidosis   . Shortness of breath    OCCASIONAL  . Thrombophilia ([place])    hemotologist--  dr Marin Olp--  monitors pt anitcoagulation  . Urgency of urination   . Venous insufficiency   . VIN III (vulvar intraepithelial neoplasia III)     Past Surgical History:  Procedure Laterality Date  . ABDOMINAL HYSTERECTOMY  1970's  . APPENDECTOMY  1966  . CARDIAC CATHETERIZATION  08-26-2003   dr Lorenz Coaster. non-obstructive CAD & LVD with ef 46%, global HK, 2+MR, PA press 50/24 w/ wedge 20  .  CARDIAC CATHETERIZATION  08-03-2008   dr Angelena Form   Non-obstructive CAD/  30-40% pLAD,  30% ostial CFX/  symptomatic bradycardia/  normal renal arteries  . CARDIAC CATHETERIZATION N/A 07/21/2016   Procedure: Right Heart Cath;  Surgeon: Larey Dresser, MD;  Location: [facility];  Service: Cardiovascular;  Laterality: N/A;  . CARDIAC PACEMAKER PLACEMENT  08-04-2008  dr Lovena Le gregg   for CHB--  MEDTRONIC DUAL CHAMBER  . CATARACTS REMOVED    . CHOLECYSTECTOMY  01/1996  . DECCOMPRESSION ULNAR NERVE AT CUBITAL TUNNEL AND RESECTION MUSCLE Left 03-17-2008  . EXCISION LEFT SUPRACLAVICULAR LYMPH NODE   04-22-2007   granulomatous inflammation,  extensive  . INSERTION OF VENA CAVA FILTER  08/ 2011  . KNEE ARTHROSCOPY     LEFT  . LUMBAR [place] SURGERY  x2   1990  . SHOULDER ARTHROSCOPY WITH ROTATOR CUFF REPAIR AND SUBACROMIAL DECOMPRESSION Right 02-08-2009  . TOTAL KNEE ARTHROPLASTY Left 04/21/2013   Procedure: LEFT TOTAL KNEE ARTHROPLASTY;  Surgeon: Gearlean Alf, MD;  Location: [facility];  Service: Orthopedics;  Laterality: Left;  . TOTAL KNEE ARTHROPLASTY Right 04/03/2016   Procedure: RIGHT TOTAL KNEE ARTHROPLASTY;  Surgeon: Gaynelle Arabian, MD;  Location: [facility];  Service: Orthopedics;  Laterality: Right;  . TRANSTHORACIC ECHOCARDIOGRAM  last one 05-19-2014   mild LVH/  ef 45-50%/  septal-lateral dyssynchroncy consistant with LBBB/  diffuse hypokinesis/  grade I diastolic dysfunction/  trivial MR and TR/  mild LAE  . TUBAL LIGATION    . VULVAR LESION REMOVAL  07/16/2012   Procedure: VULVAR LESION;  Surgeon: Imagene Gurney A. Alycia Rossetti, MD;  Location: [facility];  Service: Gynecology;  Laterality: Right;  Wide Local Excision of the Vulvar  . VULVECTOMY N/A 02/19/2015   Procedure: SIMPLE PARTIAL VULVECTOMY;  Surgeon: [name], MD;  Location[facility];  Service: Gynecology;  Laterality: N/A;     Medications:  Outpatient Encounter Medications as of 07/25/2018  Medication Sig  . acetaminophen (TYLENOL) 500 MG tablet Take 500 mg by mouth every 6 (six) hours as needed for mild pain.  . Azelastine HCl 0.15 % SOLN USE 1 TO 2 SPRAYS IN EACH  NOSTRIL DAILY  . budesonide-formoterol (SYMBICORT) 160-4.5 MCG/ACT inhaler Inhale 2 puffs into the lungs 2 (two) times daily.  . calcium-vitamin D (OSCAL WITH D) 500-200 MG-UNIT tablet Take 1 tablet by mouth daily.  . cetirizine (ZYRTEC) 10 MG tablet Take 10 mg by mouth every morning.   . clorazepate (TRANXENE) 7.5 MG tablet Take 1 tablet (7.5 mg total) by mouth 3 (three) times daily.  [name] Kitchen conjugated estrogens (PREMARIN) vaginal cream Premarin 0.625 mg/gram vaginal cream  INSERT 1/2 (ONE HALF) GRAM VAGINALLY  TWICE WEEKLY  . fluticasone (FLOVENT HFA) 44 MCG/ACT inhaler Inhale 2 puffs into the lungs 2 (two) times daily.  . furosemide (LASIX) 40 MG tablet Take 1 tablet (40 mg total) by mouth 2 (two) times daily.  [name] Kitchen ipratropium-albuterol (DUONEB) 0.5-2.5 (3) MG/3ML SOLN Take 3 mLs by nebulization 2 (two) times daily.  [name] Kitchen losartan (COZAAR) 25 MG tablet Take 25 mg by mouth daily.   . magic mouthwash SOLN Swish and swallow 5 ml 4 times daily if needed  . metoprolol succinate (TOPROL-XL) 25 MG 24 hr tablet Take 1 tablet by mouth  daily before breakfast  . montelukast (SINGULAIR) 10 MG tablet montelukast 10 mg tablet  TAKE 1 TABLET BY MOUTH EVERY DAY  . Multiple Vitamins-Minerals (PROTEGRA PO) Take by mouth.  [name] Kitchen OVER THE COUNTER MEDICATION  Take 1 capsule by mouth daily.  . pantoprazole (PROTONIX) 40 MG tablet Take 1 tablet by mouth  twice a day  . Polyethyl Glycol-Propyl Glycol (SYSTANE) 0.4-0.3 % SOLN Place 2 drops into both eyes 4 (four) times daily.   . potassium chloride (K-DUR,KLOR-CON) 10 MEQ tablet Take 2 tablets (20 mEq total) by mouth daily. Please keep upcoming appt in October for future refills. Thank you  . pravastatin (PRAVACHOL) 20 MG tablet Take 20 mg by mouth daily.  . ranitidine (ZANTAC) 150 MG capsule ranitidine 150 mg capsule  TAKE 1 CAPSULE BY MOUTH TWICE A DAY AT BEDTIME  . sennosides-docusate sodium (SENOKOT-S) 8.6-50 MG tablet Take 2 tablets by mouth at bedtime.   Alveda Reasons 20 MG TABS tablet TAKE 1 TABLET BY MOUTH  DAILY   No facility-administered encounter medications on file as of 07/25/2018.      Allergies:  Allergies  Allergen Reactions  . Cephalexin Other (See Comments)    diarrhea  . Imitrex [Sumatriptan] Other (See Comments)    Reaction unknown  . Sulfa Antibiotics Nausea Only    Doesn't like to drink lots of water.    Family History: Family History  Problem Relation Age of Onset  . Heart attack Father   . Breast cancer Sister        mets  . Heart attack Brother    . Kidney failure Sister   . Colon cancer Neg Hx   . Esophageal cancer Neg Hx   . Pancreatic cancer Neg Hx   . Liver disease Neg Hx     Social History: Social History   Tobacco Use  . Smoking status: Never Smoker  . Smokeless tobacco: Never Used  . Tobacco comment: NEVER USED TOBACCO  Substance Use Topics  . Alcohol use: No    Alcohol/week: 0.0 standard drinks  . Drug use: No   Social History   Social History Narrative   Lives alone in an independent living facility.  Has 4 children.  Retired Optometrist for [employer]  Education: some college.     Review of Systems:  CONSTITUTIONAL: No fevers, chills, night sweats, or weight loss.   EYES: No visual changes or eye pain ENT: No hearing changes.  No history of nose bleeds.   RESPIRATORY: No cough, wheezing and shortness of breath.   CARDIOVASCULAR: Negative for chest pain, and palpitations.   GI: Negative for abdominal discomfort, blood in stools or black stools.  No recent change in bowel habits.   GU:  No history of incontinence.   MUSCLOSKELETAL: No history of joint pain or swelling.  No myalgias.   SKIN: Negative for lesions, rash, and itching.   HEMATOLOGY/ONCOLOGY: Negative for prolonged bleeding, bruising easily, and swollen nodes.  No history of cancer.   ENDOCRINE: Negative for cold or heat intolerance, polydipsia or goiter.   PSYCH:  No depression or anxiety symptoms.   NEURO: As Above.   Vital Signs:  BP (!) 160/90   Pulse 92   Ht 5\' 2"  (1.575 m)   Wt 213 lb 6 oz (96.8 kg)   SpO2 94%   BMI 39.03 kg/m    General Medical Exam:   General:  Well appearing, comfortable.   Eyes/ENT: see cranial nerve examination.   Neck: No masses appreciated.  Full range of motion without tenderness.  No carotid bruits. Respiratory:  Clear to auscultation, good air entry bilaterally.   Cardiac:  Regular rate and rhythm, no murmur.   Extremities:  No deformities, edema, or skin  discoloration.  Skin:  No rashes or  lesions.  Neurological Exam: MENTAL STATUS including orientation to time, place, person, recent and remote memory, attention span and concentration, language, and fund of knowledge is normal.  Speech is not dysarthric.  CRANIAL NERVES: II:  No visual field defects.  Unremarkable fundi.   III-IV-VI: Pupils equal round and reactive to light.  Normal conjugate, extra-ocular eye movements in all directions of gaze.  No nystagmus.  No ptosis.   V:  Normal facial sensation.   VII:  Normal facial symmetry and movements.  VIII:  Normal hearing and vestibular function.   IX-X:  Normal palatal movement.   XI:  Normal shoulder shrug and head rotation.   XII:  Normal tongue strength and range of motion, no deviation or fasciculation.  MOTOR:  No atrophy, fasciculations or abnormal movements.  No pronator drift.  Tone is normal.    Right Upper Extremity:    Left Upper Extremity:    Deltoid  5/5   Deltoid  5/5   Biceps  5/5   Biceps  5/5   Triceps  5/5   Triceps  5/5   Wrist extensors  5/5   Wrist extensors  5/5   Wrist flexors  5/5   Wrist flexors  5/5   Finger extensors  5/5   Finger extensors  5/5   Finger flexors  5/5   Finger flexors  5/5   Dorsal interossei  5/5   Dorsal interossei  5/5   Abductor pollicis  5/5   Abductor pollicis  5/5   Tone (Ashworth scale)  0  Tone (Ashworth scale)  0   Right Lower Extremity:    Left Lower Extremity:    Hip flexors  5/5   Hip flexors  5/5   Hip extensors  5/5   Hip extensors  5/5   Knee flexors  5/5   Knee flexors  5/5   Knee extensors  5/5   Knee extensors  5/5   Dorsiflexors  5/5   Dorsiflexors  5/5   Plantarflexors  5/5   Plantarflexors  5/5   Toe extensors  5/5   Toe extensors  5/5   Toe flexors  5/5   Toe flexors  5/5   Tone (Ashworth scale)  0  Tone (Ashworth scale)  0   MSRs:  Right                                                                 Left brachioradialis 2+  brachioradialis 2+  biceps 2+  biceps 2+  triceps 2+  triceps 2+   patellar 2+  patellar 2+  ankle jerk 1+  ankle jerk 1+  Hoffman no  Hoffman no  plantar response down  plantar response down   SENSORY: Reduced pin prick and temperature over the right lateral thigh, sensation over the posterior and medial thigh is normal.  Sensation to all modalities in the upper extremities is normal.  Vibration is mildly reduced distally at the ankles bilaterally.   COORDINATION/GAIT: Normal finger-to- nose-finger.  Intact rapid alternating movements bilaterally.  Gait appears wide-based, assisted with rollator.   IMPRESSION: This is a 82 year-old female presenting for evaluation of right thigh paresthesia.  Her examination is consistent with diminished sensation in  all modalities over the anterolateral thigh on the right side. Based on history of exam, she has meralgia paresthetica an entrapment of the lateral femoral cutaneous nerve as it exits below the inguinal canal.  This is most likely triggered by her weight gain of 30lb over the past 1.5 year.  Management is conservative with weight loss and avoidance of repetitive trauma to this region.  I have discussed the diagnosis, pathophysiology, management plan, and risks and benefits of medications, and prognosis.     PLAN/RECOMMENDATIONS:  1. Start OTC lidocaine ointment to the right thigh as needed for pain.  If this is ineffective, we can try prescription strength lidocaine cream 2. Weight loss encouraged 3. Avoid tight fitting clothing or belts   Return to clinic as needed   Thank you for allowing me to participate in patient's care.  If I can answer any additional questions, I would be pleased to do so.    Sincerely,    Kylle Lall K. Posey Pronto, DO

## 2018-07-25 NOTE — Progress Notes (Signed)
160 90  

## 2018-08-12 ENCOUNTER — Ambulatory Visit (INDEPENDENT_AMBULATORY_CARE_PROVIDER_SITE_OTHER): Payer: Medicare Other | Admitting: *Deleted

## 2018-08-12 DIAGNOSIS — I442 Atrioventricular block, complete: Secondary | ICD-10-CM

## 2018-08-12 NOTE — Progress Notes (Signed)
Remote pacemaker transmission.   

## 2018-08-13 ENCOUNTER — Encounter: Payer: Self-pay | Admitting: Cardiology

## 2018-08-23 LAB — CUP PACEART REMOTE DEVICE CHECK
Battery Impedance: 6174 Ohm
Brady Statistic AP VP Percent: 13 %
Brady Statistic AS VS Percent: 74 %
Date Time Interrogation Session: 20190826111333
Implantable Lead Implant Date: 20090818
Implantable Lead Location: 753860
Implantable Lead Model: 5076
Implantable Pulse Generator Implant Date: 20090818
Lead Channel Impedance Value: 403 Ohm
Lead Channel Impedance Value: 480 Ohm
Lead Channel Pacing Threshold Amplitude: 0.5 V
Lead Channel Pacing Threshold Pulse Width: 0.4 ms
Lead Channel Setting Pacing Amplitude: 2.5 V
Lead Channel Setting Sensing Sensitivity: 2.8 mV
MDC IDC LEAD IMPLANT DT: 20090818
MDC IDC LEAD LOCATION: 753859
MDC IDC MSMT BATTERY REMAINING LONGEVITY: 1 mo
MDC IDC MSMT BATTERY VOLTAGE: 2.64 V
MDC IDC MSMT LEADCHNL RV PACING THRESHOLD AMPLITUDE: 1 V
MDC IDC MSMT LEADCHNL RV PACING THRESHOLD PULSEWIDTH: 0.4 ms
MDC IDC SET LEADCHNL RA PACING AMPLITUDE: 2 V
MDC IDC SET LEADCHNL RV PACING PULSEWIDTH: 0.4 ms
MDC IDC STAT BRADY AP VS PERCENT: 1 %
MDC IDC STAT BRADY AS VP PERCENT: 12 %

## 2018-09-06 ENCOUNTER — Encounter

## 2018-09-06 ENCOUNTER — Ambulatory Visit: Payer: Medicare Other | Admitting: Neurology

## 2018-09-09 ENCOUNTER — Telehealth: Payer: Self-pay | Admitting: *Deleted

## 2018-09-09 NOTE — Telephone Encounter (Signed)
Scheduled the patient for a follow up appt tomorrow with Dr. Fermin Schwab; appt at 1:45pm. Called and spoke with Seth Bake at [facility], gave appt for tomorrow.

## 2018-09-10 ENCOUNTER — Encounter: Payer: Self-pay | Admitting: Gynecology

## 2018-09-10 ENCOUNTER — Inpatient Hospital Stay: Payer: Medicare Other | Attending: Gynecology | Admitting: Gynecology

## 2018-09-10 VITALS — BP 157/79 | HR 101 | Temp 98.0°F | Resp 20 | Ht 62.5 in | Wt 215.8 lb

## 2018-09-10 DIAGNOSIS — D071 Carcinoma in situ of vulva: Secondary | ICD-10-CM | POA: Diagnosis present

## 2018-09-10 NOTE — Patient Instructions (Signed)
Our office will check with Dr. Marin Olp about how long you can be off your xarelto and we will also discuss with Dr. Denman George.  Recommendation is to have an excision of the pre-cancerous area that was biopsied. Once we have the recommendations we will contact you with the dates.  We will plan to have your procedure (partial vulvectomy) scheduled at the [facility].  Once scheduled, you will receive a phone call from the nurse to discuss instructions.

## 2018-09-10 NOTE — H&P (View-Only) (Signed)
Consult Note: Gyn-Onc   [name] [name] 82 y.o. female  No chief complaint on file.   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  ASCUS Pap smear.  I am unable to identify any abnormalities on colposcopic exam of the vagina today.  Plan: With regard to the vulvar lesion I would recommend wide local excision and primary closure.  This will be scheduled with Dr. [name] in the near future.  The patient is on Xarelto will need to have that discontinued prior to surgery.  Interval History: Patient returns today for follow-up.  She was last seen in 2016 at which time she had a partial vulvectomy for VIN 3.  She recently saw Dr. Benjie Karvonen who obtained a Pap smear showing ASCUS with high risk HPV.  In addition a vulvar lesion was seen and biopsied returning VIN 3.  HPI: February 19, 2015 the patient underwent a partial simple vulvectomy for VIN 3.  Final pathology showed VIN 3 with a focal positive vaginal margin.  Review of Systems:10 point review of systems is negative except as noted in interval history.   Vitals: Blood pressure (!) 157/79, pulse (!) 101, temperature 98 F (36.7 C), resp. rate 20, height 5' 2.5" (1.588 m), weight 215 lb 12.8 oz (97.9 kg), SpO2 97 %.  Physical Exam: General : The patient is a healthy woman in no acute distress.  HEENT: normocephalic, extraoccular movements normal; neck is supple without thyromegally  Lynphnodes: Supraclavicular and inguinal nodes not enlarged  Abdomen: Soft, non-tender, no ascites, no organomegally, no masses, no hernias  Pelvic:  EGBUS: Normal female, after applying acetic acid there is a 3 cm raised lesion on the posterior right vulva near the introitus.  A suture is in place from prior biopsy. Vagina: Normal, no lesions.  Colposcopic examination of the vagina and the use of acetic acid as well as Lugol's shows no lesions. Urethra and Bladder: Normal, non-tender  Cervix: Surgically absent  Uterus: Surgically absent  Bi-manual  examination: Non-tender; no adenxal masses or nodularity  Rectal: normal sphincter tone, no masses, no blood  Lower extremities: No edema or varicosities. Normal range of motion      Allergies  Allergen Reactions  . Cephalexin Other (See Comments)    diarrhea  . Imitrex [Sumatriptan] Other (See Comments)    Reaction unknown  . Sulfa Antibiotics Nausea Only    Doesn't like to drink lots of water.    Past Medical History:  Diagnosis Date  . Allergic rhinitis   . Anxiety   . Cardiac pacemaker in situ    DOI  08-04-2008  medtronic dual chamber  . Chronic anticoagulation   . Chronic combined systolic and diastolic CHF, NYHA class 2 ([place])    montior by cardiologist-- dr taylor--  ef 45%  . Coronary artery disease CARDIOLOGIST-- DR Cristopher Peru   per cath-- Non-obstructive minimal CAD  . Fluid retention   . GERD (gastroesophageal reflux disease)   . History of cervical dysplasia    CIN III  . History of CVA (cerebrovascular accident)    09-15-2005  Left Cerebellar Hematoma with mild stem compression whild on coumadin for DVT  . History of DVT of lower extremity   . History of non-ST elevation myocardial infarction (NSTEMI)    2011-- in setting of bilateral PE's and DVT's and CHF  . History of peptic ulcer disease   . History of pulmonary embolus (PE)   . Hyperlipidemia   . Hypertension   .  Incontinence of urine   . LBBB (left bundle branch block)   . Mild obstructive sleep apnea    PT USES Humidied Nasal Mask with O2 at2.5L (study 08-05-2009)  . Mild pulmonary hypertension ([place])   . Moderate intermittent asthma   . OA (osteoarthritis)   . PAF (paroxysmal atrial fibrillation) ([place])   . Renal insufficiency   . Sarcoidosis   . Shortness of breath    OCCASIONAL  . Thrombophilia ([place])    hemotologist--  dr Marin Olp--  monitors pt anitcoagulation  . Urgency of urination   . Venous insufficiency   . VIN III (vulvar intraepithelial neoplasia III)     Past Surgical History:   Procedure Laterality Date  . ABDOMINAL HYSTERECTOMY  1970's  . APPENDECTOMY  1966  . CARDIAC CATHETERIZATION  08-26-2003   dr Lorenz Coaster. non-obstructive CAD & LVD with ef 46%, global HK, 2+MR, PA press 50/24 w/ wedge 20  . CARDIAC CATHETERIZATION  08-03-2008   dr Angelena Form   Non-obstructive CAD/  30-40% pLAD,  30% ostial CFX/  symptomatic bradycardia/  normal renal arteries  . CARDIAC CATHETERIZATION N/A 07/21/2016   Procedure: Right Heart Cath;  Surgeon: Larey Dresser, MD;  Location: [facility];  Service: Cardiovascular;  Laterality: N/A;  . CARDIAC PACEMAKER PLACEMENT  08-04-2008  dr Lovena Le gregg   for CHB--  MEDTRONIC DUAL CHAMBER  . CATARACTS REMOVED    . CHOLECYSTECTOMY  01/1996  . DECCOMPRESSION ULNAR NERVE AT CUBITAL TUNNEL AND RESECTION MUSCLE Left 03-17-2008  . EXCISION LEFT SUPRACLAVICULAR LYMPH NODE   04-22-2007   granulomatous inflammation, extensive  . INSERTION OF VENA CAVA FILTER  08/ 2011  . KNEE ARTHROSCOPY     LEFT  . LUMBAR [place] SURGERY  x2   1990  . SHOULDER ARTHROSCOPY WITH ROTATOR CUFF REPAIR AND SUBACROMIAL DECOMPRESSION Right 02-08-2009  . TOTAL KNEE ARTHROPLASTY Left 04/21/2013   Procedure: LEFT TOTAL KNEE ARTHROPLASTY;  Surgeon: Gearlean Alf, MD;  Location: [facility];  Service: Orthopedics;  Laterality: Left;  . TOTAL KNEE ARTHROPLASTY Right 04/03/2016   Procedure: RIGHT TOTAL KNEE ARTHROPLASTY;  Surgeon: Gaynelle Arabian, MD;  Location: [facility];  Service: Orthopedics;  Laterality: Right;  . TRANSTHORACIC ECHOCARDIOGRAM  last one 05-19-2014   mild LVH/  ef 45-50%/  septal-lateral dyssynchroncy consistant with LBBB/  diffuse hypokinesis/  grade I diastolic dysfunction/  trivial MR and TR/  mild LAE  . TUBAL LIGATION    . VULVAR LESION REMOVAL  07/16/2012   Procedure: VULVAR LESION;  Surgeon: Imagene Gurney A. Alycia Rossetti, MD;  Location: [facility];  Service: Gynecology;  Laterality: Right;  Wide Local Excision of the Vulvar  . VULVECTOMY N/A 02/19/2015   Procedure: SIMPLE  PARTIAL VULVECTOMY;  Surgeon: [name], MD;  [facility];  Service: Gynecology;  Laterality: N/A;    Current Outpatient Medications  Medication Sig Dispense Refill  . acetaminophen (TYLENOL) 500 MG tablet Take 500 mg by mouth every 6 (six) hours as needed for mild pain.    . Azelastine HCl 0.15 % SOLN USE 1 TO 2 SPRAYS IN EACH  NOSTRIL DAILY 30 mL 5  . budesonide-formoterol (SYMBICORT) 160-4.5 MCG/ACT inhaler Inhale 2 puffs into the lungs 2 (two) times daily.    [name] Kitchen CALCIUM CARBONATE-VITAMIN D PO Take 1 tablet by mouth daily. Patient states this pill is 1200mg  of calcium carbonate and 1000mg  of Vitamin D    . cetirizine (ZYRTEC) 10 MG tablet Take 10 mg by mouth every morning.     [name] Kitchen  clorazepate (TRANXENE) 7.5 MG tablet Take 1 tablet (7.5 mg total) by mouth 3 (three) times daily. 90 tablet 5  . conjugated estrogens (PREMARIN) vaginal cream Premarin 0.625 mg/gram vaginal cream  INSERT 1/2 (ONE HALF) GRAM VAGINALLY TWICE WEEKLY    . furosemide (LASIX) 40 MG tablet Take 1 tablet (40 mg total) by mouth 2 (two) times daily. 180 tablet 3  . losartan (COZAAR) 25 MG tablet Take 25 mg by mouth daily.     . magic mouthwash SOLN Swish and swallow 5 ml 4 times daily if needed 200 mL 2  . metoprolol succinate (TOPROL-XL) 25 MG 24 hr tablet Take 1 tablet by mouth  daily before breakfast 90 tablet 3  . montelukast (SINGULAIR) 10 MG tablet montelukast 10 mg tablet  TAKE 1 TABLET BY MOUTH EVERY DAY    . Multiple Vitamins-Minerals (PROTEGRA PO) Take by mouth.    [name] Kitchen OVER THE COUNTER MEDICATION Take 1 capsule by mouth daily.    . pantoprazole (PROTONIX) 40 MG tablet Take 1 tablet by mouth  twice a day 180 tablet 3  . Polyethyl Glycol-Propyl Glycol (SYSTANE) 0.4-0.3 % SOLN Place 2 drops into both eyes 4 (four) times daily.     . potassium chloride (K-DUR,KLOR-CON) 10 MEQ tablet Take 2 tablets (20 mEq total) by mouth daily. Please keep upcoming appt in October for future refills. Thank you 180  tablet 0  . pravastatin (PRAVACHOL) 20 MG tablet Take 20 mg by mouth daily.    . ranitidine (ZANTAC) 150 MG capsule ranitidine 150 mg capsule  TAKE 1 CAPSULE BY MOUTH TWICE A DAY AT BEDTIME    . sennosides-docusate sodium (SENOKOT-S) 8.6-50 MG tablet Take 2 tablets by mouth at bedtime.     Alveda Reasons 20 MG TABS tablet TAKE 1 TABLET BY MOUTH  DAILY 90 tablet 6  . fluticasone (FLOVENT HFA) 44 MCG/ACT inhaler Inhale 2 puffs into the lungs 2 (two) times daily. (Patient not taking: Reported on 09/10/2018) 1 Inhaler 5   No current facility-administered medications for this visit.     Social History   Socioeconomic History  . Marital status: Widowed    Spouse name: Not on file  . Number of children: 4  . Years of education: Not on file  . Highest education level: Some college, no degree  Occupational History  . Occupation: retired  Scientific laboratory technician  . Financial resource strain: Not on file  . Food insecurity:    Worry: Not on file    Inability: Not on file  . Transportation needs:    Medical: Not on file    Non-medical: Not on file  Tobacco Use  . Smoking status: Never Smoker  . Smokeless tobacco: Never Used  . Tobacco comment: NEVER USED TOBACCO  Substance and Sexual Activity  . Alcohol use: No    Alcohol/week: 0.0 standard drinks  . Drug use: No  . Sexual activity: Not on file  Lifestyle  . Physical activity:    Days per week: Not on file    Minutes per session: Not on file  . Stress: Not on file  Relationships  . Social connections:    Talks on phone: Not on file    Gets together: Not on file    Attends religious service: Not on file    Active member of club or organization: Not on file    Attends meetings of clubs or organizations: Not on file    Relationship status: Not on file  . Intimate partner violence:  Fear of current or ex partner: Not on file    Emotionally abused: Not on file    Physically abused: Not on file    Forced sexual activity: Not on file  Other  Topics Concern  . Not on file  Social History Narrative   Lives alone in an independent living facility.  Has 4 children.  Retired Optometrist for [employer]  Education: some college.     Family History  Problem Relation Age of Onset  . Heart attack Father   . Breast cancer Sister        mets  . Heart attack Brother   . Kidney failure Sister   . Colon cancer Neg Hx   . Esophageal cancer Neg Hx   . Pancreatic cancer Neg Hx   . Liver disease Neg Hx       Marti Sleigh, MD 09/10/2018, 2:56 PM

## 2018-09-10 NOTE — Progress Notes (Signed)
Consult Note: Gyn-Onc   [name] [name] 82 y.o. female  No chief complaint on file.   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  ASCUS Pap smear.  I am unable to identify any abnormalities on colposcopic exam of the vagina today.  Plan: With regard to the vulvar lesion I would recommend wide local excision and primary closure.  This will be scheduled with Dr. [name] in the near future.  The patient is on Xarelto will need to have that discontinued prior to surgery.  Interval History: Patient returns today for follow-up.  She was last seen in 2016 at which time she had a partial vulvectomy for VIN 3.  She recently saw Dr. Benjie Karvonen who obtained a Pap smear showing ASCUS with high risk HPV.  In addition a vulvar lesion was seen and biopsied returning VIN 3.  HPI: February 19, 2015 the patient underwent a partial simple vulvectomy for VIN 3.  Final pathology showed VIN 3 with a focal positive vaginal margin.  Review of Systems:10 point review of systems is negative except as noted in interval history.   Vitals: Blood pressure (!) 157/79, pulse (!) 101, temperature 98 F (36.7 C), resp. rate 20, height 5' 2.5" (1.588 m), weight 215 lb 12.8 oz (97.9 kg), SpO2 97 %.  Physical Exam: General : The patient is a healthy woman in no acute distress.  HEENT: normocephalic, extraoccular movements normal; neck is supple without thyromegally  Lynphnodes: Supraclavicular and inguinal nodes not enlarged  Abdomen: Soft, non-tender, no ascites, no organomegally, no masses, no hernias  Pelvic:  EGBUS: Normal female, after applying acetic acid there is a 3 cm raised lesion on the posterior right vulva near the introitus.  A suture is in place from prior biopsy. Vagina: Normal, no lesions.  Colposcopic examination of the vagina and the use of acetic acid as well as Lugol's shows no lesions. Urethra and Bladder: Normal, non-tender  Cervix: Surgically absent  Uterus: Surgically absent  Bi-manual  examination: Non-tender; no adenxal masses or nodularity  Rectal: normal sphincter tone, no masses, no blood  Lower extremities: No edema or varicosities. Normal range of motion      Allergies  Allergen Reactions  . Cephalexin Other (See Comments)    diarrhea  . Imitrex [Sumatriptan] Other (See Comments)    Reaction unknown  . Sulfa Antibiotics Nausea Only    Doesn't like to drink lots of water.    Past Medical History:  Diagnosis Date  . Allergic rhinitis   . Anxiety   . Cardiac pacemaker in situ    DOI  08-04-2008  medtronic dual chamber  . Chronic anticoagulation   . Chronic combined systolic and diastolic CHF, NYHA class 2 ([place])    montior by cardiologist-- dr taylor--  ef 45%  . Coronary artery disease CARDIOLOGIST-- DR Cristopher Peru   per cath-- Non-obstructive minimal CAD  . Fluid retention   . GERD (gastroesophageal reflux disease)   . History of cervical dysplasia    CIN III  . History of CVA (cerebrovascular accident)    09-15-2005  Left Cerebellar Hematoma with mild stem compression whild on coumadin for DVT  . History of DVT of lower extremity   . History of non-ST elevation myocardial infarction (NSTEMI)    2011-- in setting of bilateral PE's and DVT's and CHF  . History of peptic ulcer disease   . History of pulmonary embolus (PE)   . Hyperlipidemia   . Hypertension   .  Incontinence of urine   . LBBB (left bundle branch block)   . Mild obstructive sleep apnea    PT USES Humidied Nasal Mask with O2 at2.5L (study 08-05-2009)  . Mild pulmonary hypertension ([place])   . Moderate intermittent asthma   . OA (osteoarthritis)   . PAF (paroxysmal atrial fibrillation) ([place])   . Renal insufficiency   . Sarcoidosis   . Shortness of breath    OCCASIONAL  . Thrombophilia ([place])    hemotologist--  dr Marin Olp--  monitors pt anitcoagulation  . Urgency of urination   . Venous insufficiency   . VIN III (vulvar intraepithelial neoplasia III)     Past Surgical History:   Procedure Laterality Date  . ABDOMINAL HYSTERECTOMY  1970's  . APPENDECTOMY  1966  . CARDIAC CATHETERIZATION  08-26-2003   dr Lorenz Coaster. non-obstructive CAD & LVD with ef 46%, global HK, 2+MR, PA press 50/24 w/ wedge 20  . CARDIAC CATHETERIZATION  08-03-2008   dr Angelena Form   Non-obstructive CAD/  30-40% pLAD,  30% ostial CFX/  symptomatic bradycardia/  normal renal arteries  . CARDIAC CATHETERIZATION N/A 07/21/2016   Procedure: Right Heart Cath;  Surgeon: Larey Dresser, MD;  Location: [facility];  Service: Cardiovascular;  Laterality: N/A;  . CARDIAC PACEMAKER PLACEMENT  08-04-2008  dr Lovena Le gregg   for CHB--  MEDTRONIC DUAL CHAMBER  . CATARACTS REMOVED    . CHOLECYSTECTOMY  01/1996  . DECCOMPRESSION ULNAR NERVE AT CUBITAL TUNNEL AND RESECTION MUSCLE Left 03-17-2008  . EXCISION LEFT SUPRACLAVICULAR LYMPH NODE   04-22-2007   granulomatous inflammation, extensive  . INSERTION OF VENA CAVA FILTER  08/ 2011  . KNEE ARTHROSCOPY     LEFT  . LUMBAR [place] SURGERY  x2   1990  . SHOULDER ARTHROSCOPY WITH ROTATOR CUFF REPAIR AND SUBACROMIAL DECOMPRESSION Right 02-08-2009  . TOTAL KNEE ARTHROPLASTY Left 04/21/2013   Procedure: LEFT TOTAL KNEE ARTHROPLASTY;  Surgeon: Gearlean Alf, MD;  [facility];  Service: Orthopedics;  Laterality: Left;  . TOTAL KNEE ARTHROPLASTY Right 04/03/2016   Procedure: RIGHT TOTAL KNEE ARTHROPLASTY;  Surgeon: Gaynelle Arabian, MD;  [facility]: [facility];  Service: Orthopedics;  Laterality: Right;  . TRANSTHORACIC ECHOCARDIOGRAM  last one 05-19-2014   mild LVH/  ef 45-50%/  septal-lateral dyssynchroncy consistant with LBBB/  diffuse hypokinesis/  grade I diastolic dysfunction/  trivial MR and TR/  mild LAE  . TUBAL LIGATION    . VULVAR LESION REMOVAL  07/16/2012   Procedure: VULVAR LESION;  Surgeon: Imagene Gurney A. Alycia Rossetti, MD;  [facility]: [facility];  Service: Gynecology;  Laterality: Right;  Wide Local Excision of the Vulvar  . VULVECTOMY N/A 02/19/2015   Procedure: SIMPLE  PARTIAL VULVECTOMY;  Surgeon: [name], MD;  [facility];  Service: Gynecology;  Laterality: N/A;    Current Outpatient Medications  Medication Sig Dispense Refill  . acetaminophen (TYLENOL) 500 MG tablet Take 500 mg by mouth every 6 (six) hours as needed for mild pain.    . Azelastine HCl 0.15 % SOLN USE 1 TO 2 SPRAYS IN EACH  NOSTRIL DAILY 30 mL 5  . budesonide-formoterol (SYMBICORT) 160-4.5 MCG/ACT inhaler Inhale 2 puffs into the lungs 2 (two) times daily.    [name] Kitchen CALCIUM CARBONATE-VITAMIN D PO Take 1 tablet by mouth daily. Patient states this pill is 1200mg  of calcium carbonate and 1000mg  of Vitamin D    . cetirizine (ZYRTEC) 10 MG tablet Take 10 mg by mouth every morning.     [name] Kitchen  clorazepate (TRANXENE) 7.5 MG tablet Take 1 tablet (7.5 mg total) by mouth 3 (three) times daily. 90 tablet 5  . conjugated estrogens (PREMARIN) vaginal cream Premarin 0.625 mg/gram vaginal cream  INSERT 1/2 (ONE HALF) GRAM VAGINALLY TWICE WEEKLY    . furosemide (LASIX) 40 MG tablet Take 1 tablet (40 mg total) by mouth 2 (two) times daily. 180 tablet 3  . losartan (COZAAR) 25 MG tablet Take 25 mg by mouth daily.     . magic mouthwash SOLN Swish and swallow 5 ml 4 times daily if needed 200 mL 2  . metoprolol succinate (TOPROL-XL) 25 MG 24 hr tablet Take 1 tablet by mouth  daily before breakfast 90 tablet 3  . montelukast (SINGULAIR) 10 MG tablet montelukast 10 mg tablet  TAKE 1 TABLET BY MOUTH EVERY DAY    . Multiple Vitamins-Minerals (PROTEGRA PO) Take by mouth.    [name] Kitchen OVER THE COUNTER MEDICATION Take 1 capsule by mouth daily.    . pantoprazole (PROTONIX) 40 MG tablet Take 1 tablet by mouth  twice a day 180 tablet 3  . Polyethyl Glycol-Propyl Glycol (SYSTANE) 0.4-0.3 % SOLN Place 2 drops into both eyes 4 (four) times daily.     . potassium chloride (K-DUR,KLOR-CON) 10 MEQ tablet Take 2 tablets (20 mEq total) by mouth daily. Please keep upcoming appt in October for future refills. Thank you 180  tablet 0  . pravastatin (PRAVACHOL) 20 MG tablet Take 20 mg by mouth daily.    . ranitidine (ZANTAC) 150 MG capsule ranitidine 150 mg capsule  TAKE 1 CAPSULE BY MOUTH TWICE A DAY AT BEDTIME    . sennosides-docusate sodium (SENOKOT-S) 8.6-50 MG tablet Take 2 tablets by mouth at bedtime.     Alveda Reasons 20 MG TABS tablet TAKE 1 TABLET BY MOUTH  DAILY 90 tablet 6  . fluticasone (FLOVENT HFA) 44 MCG/ACT inhaler Inhale 2 puffs into the lungs 2 (two) times daily. (Patient not taking: Reported on 09/10/2018) 1 Inhaler 5   No current facility-administered medications for this visit.     Social History   Socioeconomic History  . Marital status: Widowed    Spouse name: Not on file  . Number of children: 4  . Years of education: Not on file  . Highest education level: Some college, no degree  Occupational History  . Occupation: retired  Scientific laboratory technician  . Financial resource strain: Not on file  . Food insecurity:    Worry: Not on file    Inability: Not on file  . Transportation needs:    Medical: Not on file    Non-medical: Not on file  Tobacco Use  . Smoking status: Never Smoker  . Smokeless tobacco: Never Used  . Tobacco comment: NEVER USED TOBACCO  Substance and Sexual Activity  . Alcohol use: No    Alcohol/week: 0.0 standard drinks  . Drug use: No  . Sexual activity: Not on file  Lifestyle  . Physical activity:    Days per week: Not on file    Minutes per session: Not on file  . Stress: Not on file  Relationships  . Social connections:    Talks on phone: Not on file    Gets together: Not on file    Attends religious service: Not on file    Active member of club or organization: Not on file    Attends meetings of clubs or organizations: Not on file    Relationship status: Not on file  . Intimate partner violence:  Fear of current or ex partner: Not on file    Emotionally abused: Not on file    Physically abused: Not on file    Forced sexual activity: Not on file  Other  Topics Concern  . Not on file  Social History Narrative   Lives alone in an independent living facility.  Has 4 children.  Retired Optometrist for [employer]  Education: some college.     Family History  Problem Relation Age of Onset  . Heart attack Father   . Breast cancer Sister        mets  . Heart attack Brother   . Kidney failure Sister   . Colon cancer Neg Hx   . Esophageal cancer Neg Hx   . Pancreatic cancer Neg Hx   . Liver disease Neg Hx       Marti Sleigh, MD 09/10/2018, 2:56 PM

## 2018-09-12 ENCOUNTER — Ambulatory Visit (INDEPENDENT_AMBULATORY_CARE_PROVIDER_SITE_OTHER): Payer: Medicare Other | Admitting: *Deleted

## 2018-09-12 DIAGNOSIS — Z950 Presence of cardiac pacemaker: Secondary | ICD-10-CM

## 2018-09-12 NOTE — Progress Notes (Signed)
Remote pacemaker transmission.   

## 2018-09-13 ENCOUNTER — Encounter: Payer: Self-pay | Admitting: Cardiology

## 2018-09-17 ENCOUNTER — Telehealth: Payer: Self-pay

## 2018-09-17 NOTE — Telephone Encounter (Addendum)
Outgoing call to pt per [facility] cross NP-  "Can you see if pt is ok with surgery on Oct 22nd? She will need to be off Xarelto for 5 days, then start therapeutic Lovenox (twice a day) for 3 days before surgery.   No dose the morning of surgery"  No answer. Left her a VM to call our office with contact info.  Pt called back and I let her know above information per [facility] NP.  Pt wants Melissa NP to know that she also has 'nose surgery- removing a skin cancer off side of nose' on November 13th.  She also wants our office to be in contact with Dr Marin Olp regarding Xarelto before she is scheduled for surgery.  I let her know I would notify Melissa NP and pt said she will call back after lunch.  No other needs per pt at this time.

## 2018-09-17 NOTE — Telephone Encounter (Signed)
Incoming call from patient.  Pt agreed with Oct 22nd as her surgery date, verbalized as long our office is in touch with Dr Marin Olp regarding her Xarelto.  Explained to pt that [name] NP has reached out to his office and nurse will call pt back regarding plan with stopping Xarelto and starting Lovenox -see previous nurse's note per [facility] NP. Pt voiced understanding and no other needs at this time.

## 2018-09-19 ENCOUNTER — Telehealth: Payer: Self-pay

## 2018-09-19 NOTE — Telephone Encounter (Signed)
Outgoing call to patient per [name] NP- Explained to pt per Dr Marin Olp, "no problems with Dr Serita Grit plan" regarding her Xarelto.  Per Lenna Sciara NP, patient "will need to be off Xarelto for 5 days, 10/02/2018 will be last dose,  then start therapeutic Lovenox on 10/05/2018, twice a day for 3 days before surgery, no dose the morning of surgery, which is 10/08/2018.  We discussed Lovenox administration - sub-q injection- pinching up fat, love-handle area, alternating sites. Pt voiced understanding of instructions and took notes as we spoke.  Pt has our number if she has additional questions about plan for stopping Xarelto and starting Lovenox and if any questions about administration of Lovenox.  Pt wants med called to [facility], notified Melissa NP. Made pt aware that she will receive a call from pre-surgical nurse few days prior to surgery. Pt asked about when Xarelto would resume and I told her to discuss with provider day of surgery.  Pt voiced understanding and no other needs per her at this time.

## 2018-09-20 ENCOUNTER — Other Ambulatory Visit: Payer: Self-pay | Admitting: Gynecologic Oncology

## 2018-09-20 DIAGNOSIS — I2782 Chronic pulmonary embolism: Secondary | ICD-10-CM

## 2018-09-20 MED ORDER — ENOXAPARIN SODIUM 100 MG/ML ~~LOC~~ SOLN: 1.0000 mg/kg | Freq: Two times a day (BID) | SUBCUTANEOUS | 0 refills | Status: DC

## 2018-09-23 ENCOUNTER — Telehealth: Payer: Self-pay

## 2018-09-23 NOTE — Telephone Encounter (Signed)
[name] [name] is agreeable to moving her surgery to 10-01-18 from 10-08-18. She will take her last dose of Xarelto on 09-25-18  Begin Lovenox injections on 09-28-18 through 09-30-18.  Pt verbalized understanding.

## 2018-09-25 ENCOUNTER — Ambulatory Visit (INDEPENDENT_AMBULATORY_CARE_PROVIDER_SITE_OTHER): Payer: Medicare Other | Admitting: Internal Medicine

## 2018-09-25 ENCOUNTER — Encounter: Payer: Self-pay | Admitting: Internal Medicine

## 2018-09-25 VITALS — BP 128/78 | HR 90 | Ht 62.5 in | Wt 217.4 lb

## 2018-09-25 DIAGNOSIS — Z950 Presence of cardiac pacemaker: Secondary | ICD-10-CM | POA: Diagnosis not present

## 2018-09-25 DIAGNOSIS — I10 Essential (primary) hypertension: Secondary | ICD-10-CM

## 2018-09-25 DIAGNOSIS — I442 Atrioventricular block, complete: Secondary | ICD-10-CM

## 2018-09-25 DIAGNOSIS — I482 Chronic atrial fibrillation, unspecified: Secondary | ICD-10-CM | POA: Diagnosis not present

## 2018-09-25 NOTE — Progress Notes (Signed)
HPI Ms. Moseman returns today for followup of her chronic systolic heart failure, high grade heart block, s/p PPM insertion, LBBB, and HTN. She has had improvement in her dyspnea but is still class 3A. She is approaching ERI on her DDD PM. She is pending surgery.  Her EF is 40% by echo. She has not had syncope. She admits to sodium indiscretion and peripheral edema. Allergies  Allergen Reactions  . Cephalexin Other (See Comments)    diarrhea  . Imitrex [Sumatriptan] Other (See Comments)    Reaction unknown  . Sulfa Antibiotics Nausea Only    Doesn't like to drink lots of water.     Current Outpatient Medications  Medication Sig Dispense Refill  . acetaminophen (TYLENOL) 500 MG tablet Take 500 mg by mouth every 6 (six) hours as needed for mild pain.    . Azelastine HCl 0.15 % SOLN USE 1 TO 2 SPRAYS IN EACH  NOSTRIL DAILY 30 mL 5  . budesonide-formoterol (SYMBICORT) 160-4.5 MCG/ACT inhaler Inhale 2 puffs into the lungs 2 (two) times daily.    [name] Kitchen CALCIUM CARBONATE-VITAMIN D PO Take 1 tablet by mouth daily. Patient states this pill is 1200mg  of calcium carbonate and 1000mg  of Vitamin D    . cetirizine (ZYRTEC) 10 MG tablet Take 10 mg by mouth every morning.     . clorazepate (TRANXENE) 7.5 MG tablet Take 1 tablet (7.5 mg total) by mouth 3 (three) times daily. 90 tablet 5  . conjugated estrogens (PREMARIN) vaginal cream Premarin 0.625 mg/gram vaginal cream  INSERT 1/2 (ONE HALF) GRAM VAGINALLY TWICE WEEKLY    . [START ON 10/05/2018] enoxaparin (LOVENOX) 100 MG/ML injection Inject 1 mL (100 mg total) into the skin every 12 (twelve) hours. Start 10/19, last dose 10/21 in evening the day before surgery 6 Syringe 0  . fluticasone (FLOVENT HFA) 44 MCG/ACT inhaler Inhale 2 puffs into the lungs 2 (two) times daily. 1 Inhaler 5  . furosemide (LASIX) 40 MG tablet Take 1 tablet (40 mg total) by mouth 2 (two) times daily. 180 tablet 3  . losartan (COZAAR) 25 MG tablet Take 25 mg by mouth daily.       . magic mouthwash SOLN Swish and swallow 5 ml 4 times daily if needed 200 mL 2  . metoprolol succinate (TOPROL-XL) 25 MG 24 hr tablet Take 1 tablet by mouth  daily before breakfast 90 tablet 3  . montelukast (SINGULAIR) 10 MG tablet montelukast 10 mg tablet  TAKE 1 TABLET BY MOUTH EVERY DAY    . Multiple Vitamins-Minerals (PROTEGRA PO) Take by mouth daily.     . pantoprazole (PROTONIX) 40 MG tablet Take 1 tablet by mouth  twice a day 180 tablet 3  . Polyethyl Glycol-Propyl Glycol (SYSTANE) 0.4-0.3 % SOLN Place 2 drops into both eyes 4 (four) times daily.     . potassium chloride (K-DUR,KLOR-CON) 10 MEQ tablet Take 2 tablets (20 mEq total) by mouth daily. Please keep upcoming appt in October for future refills. Thank you 180 tablet 0  . pravastatin (PRAVACHOL) 20 MG tablet Take 20 mg by mouth daily.    . ranitidine (ZANTAC) 150 MG capsule Take 150 mg by mouth 2 (two) times daily.     . sennosides-docusate sodium (SENOKOT-S) 8.6-50 MG tablet Take 2 tablets by mouth at bedtime.     Alveda Reasons 20 MG TABS tablet TAKE 1 TABLET BY MOUTH  DAILY 90 tablet 6   No current facility-administered medications for this visit.  Past Medical History:  Diagnosis Date  . Allergic rhinitis   . Anxiety   . Cardiac pacemaker in situ    DOI  08-04-2008  medtronic dual chamber  . Chronic anticoagulation   . Chronic combined systolic and diastolic CHF, NYHA class 2 ([place])    montior by cardiologist-- dr Vaibhav Fogleman--  ef 45%  . Coronary artery disease CARDIOLOGIST-- DR Cristopher Peru   per cath-- Non-obstructive minimal CAD  . Fluid retention   . GERD (gastroesophageal reflux disease)   . History of cervical dysplasia    CIN III  . History of CVA (cerebrovascular accident)    09-15-2005  Left Cerebellar Hematoma with mild stem compression whild on coumadin for DVT  . History of DVT of lower extremity   . History of non-ST elevation myocardial infarction (NSTEMI)    2011-- in setting of bilateral PE's and  DVT's and CHF  . History of peptic ulcer disease   . History of pulmonary embolus (PE)   . Hyperlipidemia   . Hypertension   . Incontinence of urine   . LBBB (left bundle branch block)   . Mild obstructive sleep apnea    PT USES Humidied Nasal Mask with O2 at2.5L (study 08-05-2009)  . Mild pulmonary hypertension ([place])   . Moderate intermittent asthma   . OA (osteoarthritis)   . PAF (paroxysmal atrial fibrillation) ([place])   . Renal insufficiency   . Sarcoidosis   . Shortness of breath    OCCASIONAL  . Thrombophilia ([place])    hemotologist--  dr Marin Olp--  monitors pt anitcoagulation  . Urgency of urination   . Venous insufficiency   . VIN III (vulvar intraepithelial neoplasia III)     ROS:   All systems reviewed and negative except as noted in the HPI.   Past Surgical History:  Procedure Laterality Date  . ABDOMINAL HYSTERECTOMY  1970's  . APPENDECTOMY  1966  . CARDIAC CATHETERIZATION  08-26-2003   dr Lorenz Coaster. non-obstructive CAD & LVD with ef 46%, global HK, 2+MR, PA press 50/24 w/ wedge 20  . CARDIAC CATHETERIZATION  08-03-2008   dr Angelena Form   Non-obstructive CAD/  30-40% pLAD,  30% ostial CFX/  symptomatic bradycardia/  normal renal arteries  . CARDIAC CATHETERIZATION N/A 07/21/2016   Procedure: Right Heart Cath;  Surgeon: Larey Dresser, MD;  Location: [facility];  Service: Cardiovascular;  Laterality: N/A;  . CARDIAC PACEMAKER PLACEMENT  08-04-2008  dr Lovena Le Lecretia Buczek   for CHB--  MEDTRONIC DUAL CHAMBER  . CATARACTS REMOVED    . CHOLECYSTECTOMY  01/1996  . DECCOMPRESSION ULNAR NERVE AT CUBITAL TUNNEL AND RESECTION MUSCLE Left 03-17-2008  . EXCISION LEFT SUPRACLAVICULAR LYMPH NODE   04-22-2007   granulomatous inflammation, extensive  . INSERTION OF VENA CAVA FILTER  08/ 2011  . KNEE ARTHROSCOPY     LEFT  . LUMBAR [place] SURGERY  x2   1990  . SHOULDER ARTHROSCOPY WITH ROTATOR CUFF REPAIR AND SUBACROMIAL DECOMPRESSION Right 02-08-2009  . TOTAL KNEE ARTHROPLASTY  Left 04/21/2013   Procedure: LEFT TOTAL KNEE ARTHROPLASTY;  Surgeon: Gearlean Alf, MD;  Location: [facility];  Service: Orthopedics;  Laterality: Left;  . TOTAL KNEE ARTHROPLASTY Right 04/03/2016   Procedure: RIGHT TOTAL KNEE ARTHROPLASTY;  Surgeon: Gaynelle Arabian, MD;  Location: [facility];  Service: Orthopedics;  Laterality: Right;  . TRANSTHORACIC ECHOCARDIOGRAM  last one 05-19-2014   mild LVH/  ef 45-50%/  septal-lateral dyssynchroncy consistant with LBBB/  diffuse hypokinesis/  grade  I diastolic dysfunction/  trivial MR and TR/  mild LAE  . TUBAL LIGATION    . VULVAR LESION REMOVAL  07/16/2012   Procedure: VULVAR LESION;  Surgeon: Imagene Gurney A. Alycia Rossetti, MD;  Location: [facility];  Service: Gynecology;  Laterality: Right;  Wide Local Excision of the Vulvar  . VULVECTOMY N/A 02/19/2015   Procedure: SIMPLE PARTIAL VULVECTOMY;  Surgeon: [name], MD;  Location: [facility];  Service: Gynecology;  Laterality: N/A;     Family History  Problem Relation Age of Onset  . Heart attack Father   . Breast cancer Sister        mets  . Heart attack Brother   . Kidney failure Sister   . Colon cancer Neg Hx   . Esophageal cancer Neg Hx   . Pancreatic cancer Neg Hx   . Liver disease Neg Hx      Social History   Socioeconomic History  . Marital status: Widowed    Spouse name: Not on file  . Number of children: 4  . Years of education: Not on file  . Highest education level: Some college, no degree  Occupational History  . Occupation: retired  Scientific laboratory technician  . Financial resource strain: Not on file  . Food insecurity:    Worry: Not on file    Inability: Not on file  . Transportation needs:    Medical: Not on file    Non-medical: Not on file  Tobacco Use  . Smoking status: Never Smoker  . Smokeless tobacco: Never Used  . Tobacco comment: NEVER USED TOBACCO  Substance and Sexual Activity  . Alcohol use: No    Alcohol/week: 0.0 standard drinks  . Drug use: No  . Sexual activity: Not on  file  Lifestyle  . Physical activity:    Days per week: Not on file    Minutes per session: Not on file  . Stress: Not on file  Relationships  . Social connections:    Talks on phone: Not on file    Gets together: Not on file    Attends religious service: Not on file    Active member of club or organization: Not on file    Attends meetings of clubs or organizations: Not on file    Relationship status: Not on file  . Intimate partner violence:    Fear of current or ex partner: Not on file    Emotionally abused: Not on file    Physically abused: Not on file    Forced sexual activity: Not on file  Other Topics Concern  . Not on file  Social History Narrative   Lives alone in an independent living facility.  Has 4 children.  Retired Optometrist for [employer]  Education: some college.      BP 128/78   Pulse 90   Ht 5' 2.5" (1.588 m)   Wt 217 lb 6.4 oz (98.6 kg)   SpO2 95%   BMI 39.13 kg/m   Physical Exam:  Well appearing 82 yo woman, overweight, NAD HEENT: Unremarkable Neck:  6 cm JVD, no thyromegally Lymphatics:  No adenopathy Back:  No CVA tenderness Lungs:  Clear with no wheezes HEART:  Regular rate rhythm, no murmurs, no rubs, no clicks Abd:  soft, positive bowel sounds, no organomegally, no rebound, no guarding Ext:  2 plus pulses, no edema, no cyanosis, no clubbing Skin:  No rashes no nodules Neuro:  CN II through XII intact, motor grossly intact  EKG - none  DEVICE  Normal device function.  See PaceArt for details. Approaching ERI.  Assess/Plan: 1. [place] syncope - she is asymptomatic, s/p PPM insertion. 2. PPM - Her medtronic device is approaching ERI.  3. Chronic systolic heart failure - she is class 3. Her EF is 40% by echo. I have recommended we plan for upgrade to a Biv PPM when she reaches ERI. 4. PAF - she is maintaining NSR very nicely  Mikle Bosworth.D.

## 2018-09-25 NOTE — Patient Instructions (Addendum)
Medication Instructions:  Your physician recommends that you continue on your current medications as directed. Please refer to the Current Medication list given to you today.  Labwork: None ordered.  Testing/Procedures: None ordered.  Follow-Up: Your physician wants you to follow-up in: one year with Dr. Lovena Le.   You will receive a reminder letter in the mail two months in advance. If you don't receive a letter, please call our office to schedule the follow-up appointment.  Remote monitoring is used to monitor your Pacemaker from home. This monitoring reduces the number of office visits required to check your device to one time per year. It allows [place] to keep an eye on the functioning of your device to ensure it is working properly. You are scheduled for a device check from home on 10/25/2018. You may send your transmission at any time that day. If you have a wireless device, the transmission will be sent automatically. After your physician reviews your transmission, you will receive a postcard with your next transmission date.  Any Other Special Instructions Will Be Listed Below (If Applicable).  If you need a refill on your cardiac medications before your next appointment, please call your pharmacy.

## 2018-09-26 ENCOUNTER — Other Ambulatory Visit: Payer: Self-pay

## 2018-09-26 ENCOUNTER — Encounter (HOSPITAL_BASED_OUTPATIENT_CLINIC_OR_DEPARTMENT_OTHER): Payer: Self-pay

## 2018-09-26 NOTE — Progress Notes (Signed)
Spoke with: [name] [name] NPO:  After Midnight, no gum, candy, or mints   Arrival time:  0530AM Labs: Istat 8, (EKG 07/12/2018 chart/epic) AM medications:  Nasal spray, inhalers, eye drops per normal routine, Cetirizine, Clorazepate, Metoprolol, Montelukast, Pantoprazole, Pravastatin, Ranitidine Pre op orders: Yes Ride home: Cammy Brochure (son) [phone] Stopped Xarelto 09/25/2018 Lovenox bridge 10/12-10/14 patient is aware

## 2018-09-30 ENCOUNTER — Telehealth: Payer: Self-pay | Admitting: Oncology

## 2018-09-30 NOTE — Telephone Encounter (Signed)
[name] [name] and reviewed her instructions for [name] [name] and Lovenox before surgery tomorrow.  She said she stopped taking Xaralto on Thursday, 09/26/18.  She has been taking Lovenox twice a day since Saturday.  She said she has one more injection of Lovenox tonight at 7:30 pm and then will stop taking it.

## 2018-10-01 ENCOUNTER — Ambulatory Visit (HOSPITAL_BASED_OUTPATIENT_CLINIC_OR_DEPARTMENT_OTHER): Payer: Medicare Other | Admitting: Anesthesiology

## 2018-10-01 ENCOUNTER — Other Ambulatory Visit: Payer: Self-pay

## 2018-10-01 ENCOUNTER — Ambulatory Visit (HOSPITAL_BASED_OUTPATIENT_CLINIC_OR_DEPARTMENT_OTHER)
Admission: RE | Admit: 2018-10-01 | Discharge: 2018-10-01 | Disposition: A | Discharge status: Home / Self Care | Payer: Medicare Other | Source: Ambulatory Visit | Attending: Gynecologic Oncology | Admitting: Gynecologic Oncology

## 2018-10-01 ENCOUNTER — Encounter (HOSPITAL_BASED_OUTPATIENT_CLINIC_OR_DEPARTMENT_OTHER)
Admission: RE | Disposition: A | Discharge status: Home / Self Care | Payer: Self-pay | Source: Ambulatory Visit | Attending: Gynecologic Oncology

## 2018-10-01 ENCOUNTER — Encounter (HOSPITAL_BASED_OUTPATIENT_CLINIC_OR_DEPARTMENT_OTHER): Payer: Self-pay | Admitting: *Deleted

## 2018-10-01 DIAGNOSIS — Z9889 Other specified postprocedural states: Secondary | ICD-10-CM | POA: Insufficient documentation

## 2018-10-01 DIAGNOSIS — Z8673 Personal history of transient ischemic attack (TIA), and cerebral infarction without residual deficits: Secondary | ICD-10-CM | POA: Diagnosis not present

## 2018-10-01 DIAGNOSIS — I272 Pulmonary hypertension, unspecified: Secondary | ICD-10-CM | POA: Insufficient documentation

## 2018-10-01 DIAGNOSIS — J4520 Mild intermittent asthma, uncomplicated: Secondary | ICD-10-CM | POA: Insufficient documentation

## 2018-10-01 DIAGNOSIS — Z6838 Body mass index (BMI) 38.0-38.9, adult: Secondary | ICD-10-CM | POA: Insufficient documentation

## 2018-10-01 DIAGNOSIS — Z79899 Other long term (current) drug therapy: Secondary | ICD-10-CM | POA: Insufficient documentation

## 2018-10-01 DIAGNOSIS — G4733 Obstructive sleep apnea (adult) (pediatric): Secondary | ICD-10-CM | POA: Insufficient documentation

## 2018-10-01 DIAGNOSIS — Z950 Presence of cardiac pacemaker: Secondary | ICD-10-CM | POA: Diagnosis not present

## 2018-10-01 DIAGNOSIS — Z90710 Acquired absence of both cervix and uterus: Secondary | ICD-10-CM | POA: Insufficient documentation

## 2018-10-01 DIAGNOSIS — M1990 Unspecified osteoarthritis, unspecified site: Secondary | ICD-10-CM | POA: Diagnosis not present

## 2018-10-01 DIAGNOSIS — Z881 Allergy status to other antibiotic agents status: Secondary | ICD-10-CM | POA: Diagnosis not present

## 2018-10-01 DIAGNOSIS — J309 Allergic rhinitis, unspecified: Secondary | ICD-10-CM | POA: Insufficient documentation

## 2018-10-01 DIAGNOSIS — K219 Gastro-esophageal reflux disease without esophagitis: Secondary | ICD-10-CM | POA: Insufficient documentation

## 2018-10-01 DIAGNOSIS — Z7901 Long term (current) use of anticoagulants: Secondary | ICD-10-CM | POA: Insufficient documentation

## 2018-10-01 DIAGNOSIS — Z8711 Personal history of peptic ulcer disease: Secondary | ICD-10-CM | POA: Diagnosis not present

## 2018-10-01 DIAGNOSIS — I5042 Chronic combined systolic (congestive) and diastolic (congestive) heart failure: Secondary | ICD-10-CM | POA: Diagnosis not present

## 2018-10-01 DIAGNOSIS — D071 Carcinoma in situ of vulva: Secondary | ICD-10-CM

## 2018-10-01 DIAGNOSIS — Z9841 Cataract extraction status, right eye: Secondary | ICD-10-CM | POA: Insufficient documentation

## 2018-10-01 DIAGNOSIS — F419 Anxiety disorder, unspecified: Secondary | ICD-10-CM | POA: Diagnosis not present

## 2018-10-01 DIAGNOSIS — Z8249 Family history of ischemic heart disease and other diseases of the circulatory system: Secondary | ICD-10-CM | POA: Insufficient documentation

## 2018-10-01 DIAGNOSIS — I2510 Atherosclerotic heart disease of native coronary artery without angina pectoris: Secondary | ICD-10-CM | POA: Insufficient documentation

## 2018-10-01 DIAGNOSIS — Z96653 Presence of artificial knee joint, bilateral: Secondary | ICD-10-CM | POA: Insufficient documentation

## 2018-10-01 DIAGNOSIS — Z888 Allergy status to other drugs, medicaments and biological substances status: Secondary | ICD-10-CM | POA: Diagnosis not present

## 2018-10-01 DIAGNOSIS — E785 Hyperlipidemia, unspecified: Secondary | ICD-10-CM | POA: Insufficient documentation

## 2018-10-01 DIAGNOSIS — Z9049 Acquired absence of other specified parts of digestive tract: Secondary | ICD-10-CM | POA: Insufficient documentation

## 2018-10-01 DIAGNOSIS — Z803 Family history of malignant neoplasm of breast: Secondary | ICD-10-CM | POA: Insufficient documentation

## 2018-10-01 DIAGNOSIS — Z882 Allergy status to sulfonamides status: Secondary | ICD-10-CM | POA: Diagnosis not present

## 2018-10-01 DIAGNOSIS — Z86711 Personal history of pulmonary embolism: Secondary | ICD-10-CM | POA: Insufficient documentation

## 2018-10-01 DIAGNOSIS — K449 Diaphragmatic hernia without obstruction or gangrene: Secondary | ICD-10-CM | POA: Insufficient documentation

## 2018-10-01 DIAGNOSIS — R32 Unspecified urinary incontinence: Secondary | ICD-10-CM | POA: Diagnosis not present

## 2018-10-01 DIAGNOSIS — D869 Sarcoidosis, unspecified: Secondary | ICD-10-CM | POA: Insufficient documentation

## 2018-10-01 DIAGNOSIS — I110 Hypertensive heart disease with heart failure: Secondary | ICD-10-CM | POA: Insufficient documentation

## 2018-10-01 DIAGNOSIS — Z9842 Cataract extraction status, left eye: Secondary | ICD-10-CM | POA: Insufficient documentation

## 2018-10-01 DIAGNOSIS — Z86718 Personal history of other venous thrombosis and embolism: Secondary | ICD-10-CM | POA: Diagnosis not present

## 2018-10-01 DIAGNOSIS — I872 Venous insufficiency (chronic) (peripheral): Secondary | ICD-10-CM | POA: Insufficient documentation

## 2018-10-01 DIAGNOSIS — Z841 Family history of disorders of kidney and ureter: Secondary | ICD-10-CM | POA: Insufficient documentation

## 2018-10-01 DIAGNOSIS — I480 Paroxysmal atrial fibrillation: Secondary | ICD-10-CM | POA: Diagnosis not present

## 2018-10-01 DIAGNOSIS — I252 Old myocardial infarction: Secondary | ICD-10-CM | POA: Diagnosis not present

## 2018-10-01 DIAGNOSIS — I2782 Chronic pulmonary embolism: Secondary | ICD-10-CM

## 2018-10-01 DIAGNOSIS — N289 Disorder of kidney and ureter, unspecified: Secondary | ICD-10-CM | POA: Insufficient documentation

## 2018-10-01 HISTORY — DX: Cardiomegaly: I51.7

## 2018-10-01 HISTORY — DX: Presence of spectacles and contact lenses: Z97.3

## 2018-10-01 HISTORY — DX: Personal history of other diseases of the circulatory system: Z86.79

## 2018-10-01 HISTORY — DX: Personal history of other diseases of the respiratory system: Z87.09

## 2018-10-01 HISTORY — PX: VULVECTOMY PARTIAL: SHX6187

## 2018-10-01 HISTORY — DX: Edema, unspecified: R60.9

## 2018-10-01 HISTORY — DX: Localized edema: R60.0

## 2018-10-01 HISTORY — DX: Atherosclerosis of aorta: I70.0

## 2018-10-01 HISTORY — DX: Personal history of other diseases of the digestive system: Z87.19

## 2018-10-01 HISTORY — DX: Diverticulosis of large intestine without perforation or abscess without bleeding: K57.30

## 2018-10-01 HISTORY — DX: Conduction disorder, unspecified: I45.9

## 2018-10-01 LAB — POCT I-STAT, CHEM 8
BUN: 17 mg/dL (ref 8–23)
Calcium, Ion: 1.19 mmol/L (ref 1.15–1.40)
Chloride: 103 mmol/L (ref 98–111)
Creatinine, Ser: 1.1 mg/dL — ABNORMAL HIGH (ref 0.44–1.00)
GLUCOSE: 90 mg/dL (ref 70–99)
HEMATOCRIT: 34 % — AB (ref 36.0–46.0)
HEMOGLOBIN: 11.6 g/dL — AB (ref 12.0–15.0)
POTASSIUM: 3.9 mmol/L (ref 3.5–5.1)
Sodium: 143 mmol/L (ref 135–145)
TCO2: 31 mmol/L (ref 22–32)

## 2018-10-01 SURGERY — VULVECTOMY, PARTIAL
Anesthesia: General | Site: Vulva

## 2018-10-01 MED ORDER — PROPOFOL 10 MG/ML IV BOLUS
INTRAVENOUS | Status: AC
  Filled 2018-10-01: qty 40

## 2018-10-01 MED ORDER — ARTIFICIAL TEARS OPHTHALMIC OINT
TOPICAL_OINTMENT | OPHTHALMIC | Status: AC
  Filled 2018-10-01: qty 3.5

## 2018-10-01 MED ORDER — LIDOCAINE 2% (20 MG/ML) 5 ML SYRINGE
INTRAMUSCULAR | Status: DC | PRN
  Administered 2018-10-01: 25 mg via INTRAVENOUS
  Administered 2018-10-01: 50 mg via INTRAVENOUS

## 2018-10-01 MED ORDER — PROPOFOL 10 MG/ML IV BOLUS
INTRAVENOUS | Status: DC | PRN
  Administered 2018-10-01: 120 mg via INTRAVENOUS

## 2018-10-01 MED ORDER — ENOXAPARIN SODIUM 100 MG/ML ~~LOC~~ SOLN: 1.0000 mg/kg | Freq: Two times a day (BID) | SUBCUTANEOUS | 0 refills | Status: DC

## 2018-10-01 MED ORDER — EPHEDRINE 5 MG/ML INJ
INTRAVENOUS | Status: AC
  Filled 2018-10-01: qty 10

## 2018-10-01 MED ORDER — OXYCODONE HCL 5 MG PO TABS
5.0000 mg | ORAL_TABLET | Freq: Once | ORAL | Status: DC | PRN
  Filled 2018-10-01: qty 1

## 2018-10-01 MED ORDER — OXYCODONE HCL 5 MG PO TABS: 5.0000 mg | ORAL_TABLET | ORAL | 0 refills | Status: DC | PRN

## 2018-10-01 MED ORDER — ACETIC ACID 5 % SOLN
Status: DC | PRN
  Administered 2018-10-01: 1 via TOPICAL

## 2018-10-01 MED ORDER — LIDOCAINE 2% (20 MG/ML) 5 ML SYRINGE
INTRAMUSCULAR | Status: AC
  Filled 2018-10-01: qty 5

## 2018-10-01 MED ORDER — FENTANYL CITRATE (PF) 100 MCG/2ML IJ SOLN
INTRAMUSCULAR | Status: DC | PRN
  Administered 2018-10-01: 50 ug via INTRAVENOUS

## 2018-10-01 MED ORDER — FENTANYL CITRATE (PF) 100 MCG/2ML IJ SOLN
INTRAMUSCULAR | Status: AC
  Filled 2018-10-01: qty 2

## 2018-10-01 MED ORDER — PHENYLEPHRINE 40 MCG/ML (10ML) SYRINGE FOR IV PUSH (FOR BLOOD PRESSURE SUPPORT)
PREFILLED_SYRINGE | INTRAVENOUS | Status: DC | PRN
  Administered 2018-10-01 (×5): 80 ug via INTRAVENOUS

## 2018-10-01 MED ORDER — SENNA 8.6 MG PO TABS: 1.0000 | ORAL_TABLET | Freq: Every day | ORAL | 0 refills | Status: DC

## 2018-10-01 MED ORDER — BUPIVACAINE HCL (PF) 0.25 % IJ SOLN
INTRAMUSCULAR | Status: DC | PRN
  Administered 2018-10-01: 10 mL

## 2018-10-01 MED ORDER — PHENYLEPHRINE 40 MCG/ML (10ML) SYRINGE FOR IV PUSH (FOR BLOOD PRESSURE SUPPORT)
PREFILLED_SYRINGE | INTRAVENOUS | Status: AC
  Filled 2018-10-01: qty 10

## 2018-10-01 MED ORDER — ONDANSETRON HCL 4 MG/2ML IJ SOLN
INTRAMUSCULAR | Status: AC
  Filled 2018-10-01: qty 2

## 2018-10-01 MED ORDER — BUPIVACAINE LIPOSOME 1.3 % IJ SUSP
INTRAMUSCULAR | Status: DC | PRN
  Administered 2018-10-01: 20 mL

## 2018-10-01 MED ORDER — ONDANSETRON HCL 4 MG/2ML IJ SOLN
4.0000 mg | Freq: Four times a day (QID) | INTRAMUSCULAR | Status: DC | PRN
  Filled 2018-10-01: qty 2

## 2018-10-01 MED ORDER — EPHEDRINE SULFATE-NACL 50-0.9 MG/10ML-% IV SOSY
PREFILLED_SYRINGE | INTRAVENOUS | Status: DC | PRN
  Administered 2018-10-01 (×2): 10 mg via INTRAVENOUS

## 2018-10-01 MED ORDER — OXYCODONE HCL 5 MG PO TABS
5.0000 mg | ORAL_TABLET | ORAL | Status: DC | PRN
  Filled 2018-10-01: qty 1

## 2018-10-01 MED ORDER — OXYCODONE HCL 5 MG/5ML PO SOLN
5.0000 mg | Freq: Once | ORAL | Status: DC | PRN
  Filled 2018-10-01: qty 5

## 2018-10-01 MED ORDER — FENTANYL CITRATE (PF) 100 MCG/2ML IJ SOLN
25.0000 ug | INTRAMUSCULAR | Status: DC | PRN
  Filled 2018-10-01: qty 1

## 2018-10-01 MED ORDER — DEXAMETHASONE SODIUM PHOSPHATE 10 MG/ML IJ SOLN
INTRAMUSCULAR | Status: AC
  Filled 2018-10-01: qty 1

## 2018-10-01 MED ORDER — ONDANSETRON HCL 4 MG/2ML IJ SOLN
INTRAMUSCULAR | Status: DC | PRN
  Administered 2018-10-01: 4 mg via INTRAVENOUS

## 2018-10-01 MED ORDER — DEXAMETHASONE SODIUM PHOSPHATE 4 MG/ML IJ SOLN
INTRAMUSCULAR | Status: DC | PRN
  Administered 2018-10-01: 5 mg via INTRAVENOUS

## 2018-10-01 MED ORDER — LACTATED RINGERS IV SOLN
INTRAVENOUS | Status: DC
  Administered 2018-10-01: 07:00:00 via INTRAVENOUS
  Filled 2018-10-01: qty 1000

## 2018-10-01 SURGICAL SUPPLY — 33 items
BLADE CLIPPER SURG (BLADE) IMPLANT
BLADE SURG 15 STRL LF DISP TIS (BLADE) ×1 IMPLANT
BLADE SURG 15 STRL SS (BLADE) ×1
CANISTER SUCT 3000ML PPV (MISCELLANEOUS) ×2 IMPLANT
CATH ROBINSON RED A/P 14FR (CATHETERS) ×2 IMPLANT
COVER WAND RF STERILE (DRAPES) ×2 IMPLANT
GAUZE 4X4 16PLY RFD (DISPOSABLE) ×2 IMPLANT
GLOVE BIO SURGEON STRL SZ 6 (GLOVE) ×4 IMPLANT
GLOVE BIOGEL PI IND STRL 6.5 (GLOVE) ×1 IMPLANT
GLOVE BIOGEL PI IND STRL 7.5 (GLOVE) ×2 IMPLANT
GLOVE BIOGEL PI INDICATOR 6.5 (GLOVE) ×1
GLOVE BIOGEL PI INDICATOR 7.5 (GLOVE) ×2
GLOVE ECLIPSE 7.0 STRL STRAW (GLOVE) ×2 IMPLANT
GLOVE SURG SS PI 6.5 STRL IVOR (GLOVE) ×2 IMPLANT
GOWN STRL REUS W/TWL LRG LVL3 (GOWN DISPOSABLE) ×6 IMPLANT
KIT TURNOVER CYSTO (KITS) ×2 IMPLANT
NEEDLE HYPO 25X1 1.5 SAFETY (NEEDLE) ×2 IMPLANT
NS IRRIG 500ML POUR BTL (IV SOLUTION) ×2 IMPLANT
PACK PERINEAL COLD (PAD) ×4 IMPLANT
PACK VAGINAL WOMENS (CUSTOM PROCEDURE TRAY) ×2 IMPLANT
PENCIL BUTTON HOLSTER BLD 10FT (ELECTRODE) ×2 IMPLANT
SUT VIC AB 0 SH 27 (SUTURE) ×2 IMPLANT
SUT VIC AB 2-0 CT2 27 (SUTURE) IMPLANT
SUT VIC AB 2-0 SH 27 (SUTURE)
SUT VIC AB 2-0 SH 27XBRD (SUTURE) IMPLANT
SUT VIC AB 3-0 SH 27 (SUTURE) ×1
SUT VIC AB 3-0 SH 27X BRD (SUTURE) ×1 IMPLANT
SUT VICRYL 4-0 PS2 18IN ABS (SUTURE) ×2 IMPLANT
SYR BULB IRRIGATION 50ML (SYRINGE) ×2 IMPLANT
TOWEL OR 17X24 6PK STRL BLUE (TOWEL DISPOSABLE) ×4 IMPLANT
TUBE CONNECTING 12X1/4 (SUCTIONS) ×2 IMPLANT
WATER STERILE IRR 500ML POUR (IV SOLUTION) IMPLANT
YANKAUER SUCT BULB TIP NO VENT (SUCTIONS) ×2 IMPLANT

## 2018-10-01 NOTE — Anesthesia Preprocedure Evaluation (Signed)
Anesthesia Evaluation  Patient identified by MRN, date of birth, ID band Patient awake    Reviewed: Allergy & Precautions, H&P , NPO status , Patient's Chart, lab work & pertinent test results  Airway Mallampati: II   Neck ROM: full    Dental   Pulmonary shortness of breath, asthma , sleep apnea ,    breath sounds clear to auscultation       Cardiovascular hypertension, + CAD and +CHF  + dysrhythmias Atrial Fibrillation + pacemaker  Rhythm:regular Rate:Normal     Neuro/Psych Anxiety    GI/Hepatic hiatal hernia, PUD, GERD  ,  Endo/Other  Morbid obesity  Renal/GU Renal InsufficiencyRenal disease     Musculoskeletal  (+) Arthritis ,   Abdominal   Peds  Hematology   Anesthesia Other Findings   Reproductive/Obstetrics                             Anesthesia Physical Anesthesia Plan  ASA: III  Anesthesia Plan: General   Post-op Pain Management:    Induction: Intravenous  PONV Risk Score and Plan: 3 and Ondansetron, Dexamethasone and Treatment may vary due to age or medical condition  Airway Management Planned: LMA  Additional Equipment:   Intra-op Plan:   Post-operative Plan:   Informed Consent: I have reviewed the patients History and Physical, chart, labs and discussed the procedure including the risks, benefits and alternatives for the proposed anesthesia with the patient or authorized representative who has indicated his/her understanding and acceptance.     Plan Discussed with: CRNA, Anesthesiologist and Surgeon  Anesthesia Plan Comments:         Anesthesia Quick Evaluation

## 2018-10-01 NOTE — Op Note (Signed)
PATIENT: [name] [name] DATE: 10/01/18   Preop Diagnosis:   Postoperative Diagnosis:   Surgery: Partial simple right vulvectomy  Surgeons:  Donaciano Eva, MD Assistant: none  Anesthesia: General   Estimated blood loss: 47ml  IVF:  263ml   Urine output: 650 ml   Complications: None   Pathology: right posterior labia majora with marking stitch at midline introitus - vaginal margin(5 o'clock of the specimen)  Operative findings: raised erythematous acetowhite area measuring 3cm abutting previous vulvar incision on the posterior right vulva. Acetowhite changes across the entire right introitus extending close to urethra but without gross VIN in this area.   Procedure: The patient was identified in the preoperative holding area. Informed consent was signed on the chart. Patient was seen history was reviewed and exam was performed.   The patient was then taken to the operating room and placed in the supine position with SCD hose on. General anesthesia was then induced without difficulty. She was then placed in the dorsolithotomy position. The perineum was prepped with Betadine. The vagina was prepped with Betadine. The patient was then draped after the prep was dried. A Foley catheter was inserted into the bladder under sterile conditions.  Timeout was performed the patient, procedure, antibiotic, allergy, and length of procedure. 5% acetic acid solution was applied to the perineum. The vulvar tissues were inspected for areas of acetowhite changes or leukoplakia. The lesion was identified and the marking pen was used to circumscribe the area with appropriate surgical margins. The subcuticular tissues were infiltrated with exparel and quarter percent marcaine. The 15 blade scalpel was used to make an incision through the skin circumferentially as marked. The skin elipse was grasped and was separated from the underlying deep dermal tissues with the bovie device. After the  specimen had been completely resected, it was oriented and marked at 5 o'clock at what was the vaginal margin at the midline introitus with a 0-vicryl suture. The bovie was used to obtain hemostasis at the surgical bed. The subcutaneous tissues were irrigated and made hemostatic.   The deep dermal layer was approximated with 3-0vicryl mattress sutures to bring the skin edges into approximation and off tension. The wound was closed following langher's lines. The cutaneous layer was closed with a running and interrupted 4-0 vicryl stitches and mattress sutures to ensure a tension free and hemostatic closure. The perineum was again irrigated. The foley was removed.  All instrument, suture, laparotomy, Ray-Tec, and needle counts were correct x2. The patient tolerated the procedure well and was taken recovery room in stable condition. This is [name] [name] dictating an operative note on [name].

## 2018-10-01 NOTE — Anesthesia Procedure Notes (Signed)
Procedure Name: LMA Insertion Date/Time: 10/01/2018 7:40 AM Performed by: Albertha Ghee, MD Pre-anesthesia Checklist: Patient identified, Emergency Drugs available, Suction available and Patient being monitored Patient Re-evaluated:Patient Re-evaluated prior to induction Oxygen Delivery Method: Circle system utilized Preoxygenation: Pre-oxygenation with 100% oxygen Induction Type: IV induction Ventilation: Mask ventilation without difficulty LMA: LMA inserted LMA Size: 4.0 Number of attempts: 1 Airway Equipment and Method: Bite block Placement Confirmation: positive ETCO2 Tube secured with: Tape Dental Injury: Teeth and Oropharynx as per pre-operative assessment

## 2018-10-01 NOTE — Discharge Instructions (Signed)
Vulvectomy, Care After The vulva is the external female genitalia, outside and around the vagina and pubic bone. It consists of:  The skin on, and in front of, the pubic bone.  The clitoris.  The labia majora (large lips) on the outside of the vagina.  The labia minora (small lips) around the opening of the vagina.  The opening and the skin in and around the vagina. A vulvectomy is the removal of the tissue of the vulva, which sometimes includes removal of the lymph nodes and tissue in the groin areas. These discharge instructions provide you with general information on caring for yourself after you leave the hospital. It is also important that you know the warning signs of complications, so that you can seek treatment. Please read the instructions outlined below and refer to this sheet in the next few weeks. Your caregiver may also give you specific information and medicines. If you have any questions or complications after discharge, please call your caregiver.  Medications:  - Take ibuprofen and tylenol first line for pain control. Take these regularly (every 6 hours) to decrease the build up of pain.  - If necessary, for severe pain not relieved by ibuprofen, take oxycodone.  - While taking oxycodone you should take sennakot every night to reduce the likelihood of constipation. If this causes diarrhea, stop its use. - DO NOT TAKE ANY BLOOD THINNERS (LOVENOX OR XARELTO) ON YOUR DAY OF SURGERY 10/01/18. - PLEASE TAKE LOVENOX SHOTS IN THE MORNING AND NIGHT ON 10/16 (Wednesday) and 10/17 (Thursday) AFTER YOUR SURGERY BUT DURING THIS TIME DO NOT TAKE XARELTO. ONLY TAKE LOVENOX. - IF YOU ARE NOT BLEEDING HEAVILY FROM THE INCISION, ON Friday 10/04/18 YOU CAN BEGIN TAKING XARELTO AND TAKE NO MORE LOVENOX SHOTS  ACTIVITY  Rest as much as possible the first two weeks after discharge.  Arrange to have help from family or others with your daily activities when you go home.  Avoid heavy lifting  (more than 5 pounds), pushing, or pulling.  If you feel tired, balance your activity with rest periods.  Follow your caregiver's instruction about climbing stairs and driving a car.  Increase activity gradually.  Do not exercise until you have permission from your caregiver. LEG AND FOOT CARE If your doctor has removed lymph nodes from your groin area, there may be an increase in swelling of your legs and feet. You can help prevent swelling by doing the following:  Elevate your legs while sitting or lying down.  If your caregiver has ordered special stockings, wear them according to instructions.  Avoid standing in one place for long periods of time.  Call the physical therapy department if you have any questions about swelling or treatment for swelling.  Avoid salt in your diet. It can cause fluid retention and swelling.  Do not cross your legs, especially when sitting. NUTRITION  You may resume your normal diet.  Drink 6 to 8 glasses of fluids a day.  Eat a healthy, balanced diet including portions of food from the meat (protein), milk, fruit, vegetable, and bread groups.  Your caregiver may recommend you take a multivitamin with iron. ELIMINATION  You may notice that your stream of urine is at a different angle, and may tend to spray. Using a plastic funnel may help to decrease urine spray.  If constipation occurs, drink more liquids, and add more fruits, vegetables, and bran to your diet. You may take a mild laxative, such as Milk of Magnesia, Metamucil, or  a stool softener such as Colace, with permission from your caregiver.  Dr Denman George has prescribed you sennakot to take nightly until you are having a regular bowel movement.  HYGIENE  You may shower and wash your hair.  Check with your caregiver about tub baths.  Do not add any bath oils or chemicals to your bath water, after you have permission to take baths.  While passing urine, pour water from a bottle or spray  over your vulva to dilute the urine as it passes the incision (this will decrease burning and discomfort).  Clean yourself well after moving your bowels.  After urinating, do not wipe. Dap or pat dry with toilet paper or a dry cleath soft cloth.  A sitz bath will help keep your perineal area clean, reduce swelling, and provide comfort.  Avoid wearing underpants for the first 2 weeks and wear loose skirts to allow circulation of air around the incision  You do not need to apply dressings, salves or lotions to the wound.  The stitches are self-dissolving and will absorb and disappear over a couple of months (it is normal to notice the knot from the stitches on toilet paper after voiding). HOME CARE INSTRUCTIONS   Apply a soft ice pack (or frozen bag of peas) to your perineum (vulva) every hour in the first 48 hours after surgery. This will reduce swelling.  Avoid activities that involve a lot of friction between your legs.  Avoid wearing pants or underpants in the 1st 2 weeks (skirts are preferable).  Take your temperature twice a day and record it, especially if you feel feverish or have chills.  Follow your caregiver's instructions about medicines, activity, and follow-up appointments after surgery.  Do not drink alcohol while taking pain medicine.  Change your dressing as advised by your caregiver.  You may take over-the-counter medicine for pain, recommended by your caregiver.  If your pain is not relieved with medicine, call your caregiver.  Do not take aspirin because it can cause bleeding.  Do not douche or use tampons (use a nonperfumed sanitary pad).  Do not have sexual intercourse until your caregiver gives you permission (typically 6 weeks postoperatively). Hugging, kissing, and playful sexual activity is fine with your caregiver's permission.  Warm sitz baths, with your caregiver's permission, are helpful to control swelling and discomfort.  Take showers instead of  baths, until your caregiver gives you permission to take baths.  You may take a mild medicine for constipation, recommended by your caregiver. Bran foods and drinking a lot of fluids will help with constipation.  Make sure your family understands everything about your operation and recovery. SEEK MEDICAL CARE IF:   You notice swelling and redness around the wound area.  You notice a foul smell coming from the wound or on the surgical dressing.  You notice the wound is separating.  You have painful or bloody urination.  You develop nausea and vomiting.  You develop diarrhea.  You develop a rash.  You have a reaction or allergy from the medicine.  You feel dizzy or light-headed.  You need stronger pain medicine. SEEK IMMEDIATE MEDICAL CARE IF:   You develop a temperature of 102 F (38.9 C) or higher.  You pass out.  You develop leg or chest pain.  You develop abdominal pain.  You develop shortness of breath.  You develop bleeding from the wound area.  You see pus in the wound area. MAKE SURE YOU:   Understand these instructions.  Will watch your condition.  Will get help right away if you are not doing well or get worse. Document Released: 07/18/2004 Document Revised: 04/20/2014 Document Reviewed: 11/05/2009 [facility] Patient Information 2015 [place], [place]. This information is not intended to replace advice given to you by your health care provider. Make sure you discuss any questions you have with your health care provider.    Post Anesthesia Home Care Instructions  Activity: Get plenty of rest for the remainder of the day. A responsible individual must stay with you for 24 hours following the procedure.  For the next 24 hours, DO NOT: -Drive a car -Paediatric nurse -Drink alcoholic beverages -Take any medication unless instructed by your physician -Make any legal decisions or sign important papers.  Meals: Start with liquid foods such as gelatin or  soup. Progress to regular foods as tolerated. Avoid greasy, spicy, heavy foods. If nausea and/or vomiting occur, drink only clear liquids until the nausea and/or vomiting subsides. Call your physician if vomiting continues.  Special Instructions/Symptoms: Your throat may feel dry or sore from the anesthesia or the breathing tube placed in your throat during surgery. If this causes discomfort, gargle with warm salt water. The discomfort should disappear within 24 hours.  If you had a scopolamine patch placed behind your ear for the management of post- operative nausea and/or vomiting:  1. The medication in the patch is effective for 72 hours, after which it should be removed.  Wrap patch in a tissue and discard in the trash. Wash hands thoroughly with soap and water. 2. You may remove the patch earlier than 72 hours if you experience unpleasant side effects which may include dry mouth, dizziness or visual disturbances. 3. Avoid touching the patch. Wash your hands with soap and water after contact with the patch.

## 2018-10-01 NOTE — Transfer of Care (Signed)
  Last Vitals:  Vitals Value Taken Time  BP 156/74 10/01/2018  8:35 AM  Temp    Pulse 80 10/01/2018  8:36 AM  Resp 12 10/01/2018  8:36 AM  SpO2 99 % 10/01/2018  8:36 AM  Vitals shown include unvalidated device data.  Last Pain:  Vitals:   10/01/18 0603  TempSrc:   PainSc: 0-No pain      Patients Stated Pain Goal: 5 (10/01/18 0603) Immediate Anesthesia Transfer of Care Note  Patient: [name] [name]  Procedure(s) Performed: Procedure(s) (LRB): VULVECTOMY PARTIAL (N/A)  Patient Location: PACU  Anesthesia Type: General  Level of Consciousness: awake, alert  and oriented  Airway & Oxygen Therapy: Patient Spontanous Breathing and Patient connected to face mask oxygen  Post-op Assessment: Report given to PACU RN and Post -op Vital signs reviewed and stable  Post vital signs: Reviewed and stable  Complications: No apparent anesthesia complications

## 2018-10-01 NOTE — Interval H&P Note (Signed)
History and Physical Interval Note:  10/01/2018 7:11 AM  [name] [name]  has presented today for surgery, with the diagnosis of vulvar dysplasia  The various methods of treatment have been discussed with the patient and family. After consideration of risks, benefits and other options for treatment, the patient has consented to  Procedure(s): VULVECTOMY PARTIAL (N/A) as a surgical intervention .  The patient's history has been reviewed, patient examined, no change in status, stable for surgery.  I have reviewed the patient's chart and labs.  Questions were answered to the patient's satisfaction.     Thereasa Solo

## 2018-10-02 ENCOUNTER — Encounter (HOSPITAL_BASED_OUTPATIENT_CLINIC_OR_DEPARTMENT_OTHER): Payer: Self-pay | Admitting: Gynecologic Oncology

## 2018-10-02 NOTE — Anesthesia Postprocedure Evaluation (Signed)
Anesthesia Post Note  Patient: [name] [name]  Procedure(s) Performed: VULVECTOMY PARTIAL (N/A Vulva)     Patient location during evaluation: PACU Anesthesia Type: General Level of consciousness: awake and alert Pain management: pain level controlled Vital Signs Assessment: post-procedure vital signs reviewed and stable Respiratory status: spontaneous breathing, nonlabored ventilation, respiratory function stable and patient connected to nasal cannula oxygen Cardiovascular status: blood pressure returned to baseline and stable Postop Assessment: no apparent nausea or vomiting Anesthetic complications: no    Last Vitals:  Vitals:   10/01/18 0930 10/01/18 1020  BP: (!) 158/67 (!) 164/64  Pulse: 70 66  Resp: 20 16  Temp:  (!) 36.3 C  SpO2: 94% 96%    Last Pain:  Vitals:   10/01/18 1015  TempSrc:   PainSc: 0-No pain                 Taiyo Kozma S

## 2018-10-03 ENCOUNTER — Telehealth: Payer: Self-pay

## 2018-10-03 NOTE — Telephone Encounter (Signed)
Told Ms [name] [name] that the pathology shows pre-cancerous cells but no cancer per [name], NP. She is doing very well post-op.  She has not had to use the narcotic pain medication just tylenol. Pt very pleased.

## 2018-10-21 LAB — CUP PACEART INCLINIC DEVICE CHECK
Battery Impedance: 6385 Ohm
Battery Remaining Longevity: 1 mo — CL
Battery Voltage: 2.65 V
Brady Statistic AP VP Percent: 14 %
Brady Statistic AP VS Percent: 2 %
Brady Statistic AS VP Percent: 7 %
Brady Statistic AS VS Percent: 77 %
Date Time Interrogation Session: 20191009135951
Implantable Lead Implant Date: 20090818
Implantable Lead Implant Date: 20090818
Implantable Lead Location: 753859
Implantable Lead Location: 753860
Implantable Lead Model: 5076
Implantable Lead Model: 5076
Implantable Pulse Generator Implant Date: 20090818
Lead Channel Impedance Value: 404 Ohm
Lead Channel Impedance Value: 492 Ohm
Lead Channel Pacing Threshold Amplitude: 0.5 V
Lead Channel Pacing Threshold Amplitude: 0.75 V
Lead Channel Pacing Threshold Pulse Width: 0.4 ms
Lead Channel Pacing Threshold Pulse Width: 0.4 ms
Lead Channel Sensing Intrinsic Amplitude: 11.2 mV
Lead Channel Sensing Intrinsic Amplitude: 2 mV
Lead Channel Setting Pacing Amplitude: 2 V
Lead Channel Setting Pacing Amplitude: 2.5 V
Lead Channel Setting Pacing Pulse Width: 0.4 ms
Lead Channel Setting Sensing Sensitivity: 2.8 mV

## 2018-10-22 LAB — CUP PACEART REMOTE DEVICE CHECK
Battery Impedance: 6532 Ohm
Battery Remaining Longevity: 1 mo — CL
Brady Statistic AP VP Percent: 13 %
Brady Statistic AP VS Percent: 2 %
Brady Statistic AS VP Percent: 8 %
Brady Statistic AS VS Percent: 77 %
Implantable Lead Implant Date: 20090818
Implantable Lead Implant Date: 20090818
Implantable Lead Location: 753859
Implantable Lead Model: 5076
Lead Channel Impedance Value: 392 Ohm
Lead Channel Impedance Value: 478 Ohm
Lead Channel Pacing Threshold Amplitude: 0.5 V
Lead Channel Pacing Threshold Amplitude: 1 V
Lead Channel Pacing Threshold Pulse Width: 0.4 ms
Lead Channel Pacing Threshold Pulse Width: 0.4 ms
Lead Channel Setting Pacing Amplitude: 2 V
MDC IDC LEAD LOCATION: 753860
MDC IDC MSMT BATTERY VOLTAGE: 2.64 V
MDC IDC PG IMPLANT DT: 20090818
MDC IDC SESS DTM: 20190926104409
MDC IDC SET LEADCHNL RV PACING AMPLITUDE: 2.5 V
MDC IDC SET LEADCHNL RV PACING PULSEWIDTH: 0.4 ms
MDC IDC SET LEADCHNL RV SENSING SENSITIVITY: 2.8 mV

## 2018-10-23 ENCOUNTER — Inpatient Hospital Stay: Payer: Medicare Other | Attending: Gynecology | Admitting: Gynecologic Oncology

## 2018-10-23 ENCOUNTER — Encounter: Payer: Self-pay | Admitting: Gynecologic Oncology

## 2018-10-23 VITALS — BP 139/72 | HR 82 | Temp 98.3°F | Resp 20 | Ht 62.5 in | Wt 214.3 lb

## 2018-10-23 DIAGNOSIS — D071 Carcinoma in situ of vulva: Secondary | ICD-10-CM | POA: Insufficient documentation

## 2018-10-23 DIAGNOSIS — Z90710 Acquired absence of both cervix and uterus: Secondary | ICD-10-CM | POA: Insufficient documentation

## 2018-10-23 NOTE — Patient Instructions (Signed)
Dr Denman George would like to see you back in April, 2020 as scheduled. Please notify her office at [phone] if you have questions or concerns or if you notice new irritation on the vulva. You can stop using the premarin cream.

## 2018-10-23 NOTE — Progress Notes (Signed)
Follow-up Note: Gyn-Onc   [name] [name] 82 y.o. female  Chief Complaint  Patient presents with  . 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 S/p excision on 10/01/18 (positive margins for VIN 3 at 3 and 9 o'clock).  Plan: Recommend follow-up in 6 months with me given high risk for recurrence in setting of positive margins. .  Interval History: Patient returns today for postop follow-up.  She underwent wide local excision of the vulva on 10/01/18. Pathology showed VIN 3 with positive margins at 3 and 9 o'clock focally. No macroscopic residual disease. She did well postop with no issues.   HPI: February 19, 2015 the patient underwent a partial simple vulvectomy for VIN 3.  Final pathology showed VIN 3 with a focal positive vaginal margin. She was last seen in 2016 at which time she had a partial vulvectomy for VIN 3.  She recently saw Dr. Benjie Karvonen who obtained a Pap smear showing ASCUS with high risk HPV.  In addition a vulvar lesion was seen and biopsied returning VIN 3. Colposcopy with Dr Fermin Schwab in September, 2019 of the vagina did not show high grade lesions.  Review of Systems:10 point review of systems is negative except as noted in interval history.   Vitals: Blood pressure 139/72, pulse 82, temperature 98.3 F (36.8 C), temperature source Oral, resp. rate 20, height 5' 2.5" (1.588 m), weight 214 lb 4.8 oz (97.2 kg), SpO2 98 %.  Physical Exam: General : The patient is a healthy woman in no acute distress.  HEENT: normocephalic, extraoccular movements normal; neck is supple without thyromegally  Lynphnodes: Supraclavicular and inguinal nodes not enlarged  Abdomen: Soft, non-tender, no ascites, no organomegally, no masses, no hernias  Pelvic:  EGBUS: well healed in tact incision on right labia majora. No cellulitis or bleeding. No visible lesions.  Vagina: Normal, no lesions.   Urethra and Bladder: Normal, non-tender   Cervix: Surgically absent  Uterus: Surgically absent  Bi-manual examination: Non-tender; no adenxal masses or nodularity  Rectal: normal sphincter tone, no masses, no blood  Lower extremities: No edema or varicosities. Normal range of motion      Allergies  Allergen Reactions  . Cephalexin Other (See Comments)    diarrhea  . Imitrex [Sumatriptan] Other (See Comments)    Reaction unknown  . Sulfa Antibiotics Nausea Only    Doesn't like to drink lots of water.    Past Medical History:  Diagnosis Date  . Allergic rhinitis   . Anxiety   . Aortic atherosclerosis ([place])   . Cardiac pacemaker in situ    DOI  08-04-2008  medtronic dual chamber  . Chronic anticoagulation   . Chronic combined systolic and diastolic CHF, NYHA class 2 ([place])    [place] by cardiologist-- dr taylor--  ef 45%  . Coronary artery disease CARDIOLOGIST-- DR Cristopher Peru   per cath-- Non-obstructive minimal CAD  . Fluid retention   . GERD (gastroesophageal reflux disease)   . History of cardiomegaly    Mild  . History of cervical dysplasia    CIN III  . History of CVA (cerebrovascular accident)    09-15-2005  Left Cerebellar Hematoma with mild stem compression whild on coumadin for DVT  . History of DVT of lower extremity   . History of hemorrhoids   . History of hiatal hernia    stable, small  . History of non-ST elevation myocardial infarction (NSTEMI)    2011--  in setting of bilateral PE's and DVT's and CHF  . History of peptic ulcer disease   . History of pulmonary edema 07/25/2010  . History of pulmonary embolus (PE)   . Hyperlipidemia   . Hypertension   . Incontinence of urine   . LBBB (left bundle branch block)   . LVH (left ventricular hypertrophy) 07/16/2018   Moderate, noted on ECHO  . Mild obstructive sleep apnea    PT USES Humidied Nasal Mask with O2 at2.5L (study 08-05-2009)  . Mild pulmonary hypertension ([place])   . Moderate intermittent asthma   . OA (osteoarthritis)   . PAF  (paroxysmal atrial fibrillation) ([place])   . Peripheral edema   . Renal insufficiency   . Sarcoidosis   . Shortness of breath    OCCASIONAL  . Sigmoid diverticulosis    Mild  . Stokes-Adams syncope    asymptomatic  . Thrombophilia ([place])    hemotologist--  dr Marin Olp--  monitors pt anitcoagulation  . Urgency of urination   . Venous insufficiency   . VIN III (vulvar intraepithelial neoplasia III)   . Wears glasses    reading    Past Surgical History:  Procedure Laterality Date  . ABDOMINAL HYSTERECTOMY  1970's  . APPENDECTOMY  1966  . CARDIAC CATHETERIZATION  08-26-2003   dr Lorenz Coaster. non-obstructive CAD & LVD with ef 46%, global HK, 2+MR, PA press 50/24 w/ wedge 20  . CARDIAC CATHETERIZATION  08-03-2008   dr Angelena Form   Non-obstructive CAD/  30-40% pLAD,  30% ostial CFX/  symptomatic bradycardia/  normal renal arteries  . CARDIAC CATHETERIZATION N/A 07/21/2016   Procedure: Right Heart Cath;  Surgeon: Larey Dresser, MD;  Location: [facility];  Service: Cardiovascular;  Laterality: N/A;  . CARDIAC PACEMAKER PLACEMENT  08-04-2008  dr Lovena Le gregg   for [facility]--  MEDTRONIC DUAL CHAMBER  . CATARACTS REMOVED    . CHOLECYSTECTOMY  01/1996  . DECCOMPRESSION ULNAR NERVE AT CUBITAL TUNNEL AND RESECTION MUSCLE Left 03-17-2008  . EXCISION LEFT SUPRACLAVICULAR LYMPH NODE   04-22-2007   granulomatous inflammation, extensive  . INSERTION OF VENA CAVA FILTER  08/ 2011  . KNEE ARTHROSCOPY     LEFT  . LUMBAR [place] SURGERY  x2   1990  . SHOULDER ARTHROSCOPY WITH ROTATOR CUFF REPAIR AND SUBACROMIAL DECOMPRESSION Right 02-08-2009  . TOTAL KNEE ARTHROPLASTY Left 04/21/2013   Procedure: LEFT TOTAL KNEE ARTHROPLASTY;  Surgeon: Gearlean Alf, MD;  Location: [facility];  Service: Orthopedics;  Laterality: Left;  . TOTAL KNEE ARTHROPLASTY Right 04/03/2016   Procedure: RIGHT TOTAL KNEE ARTHROPLASTY;  Surgeon: Gaynelle Arabian, MD;  Location: [facility];  Service: Orthopedics;  Laterality: Right;  .  TRANSTHORACIC ECHOCARDIOGRAM  last one 05-19-2014   mild LVH/  ef 45-50%/  septal-lateral dyssynchroncy consistant with LBBB/  diffuse hypokinesis/  grade I diastolic dysfunction/  trivial MR and TR/  mild LAE  . TUBAL LIGATION    . VULVAR LESION REMOVAL  07/16/2012   Procedure: VULVAR LESION;  Surgeon: Imagene Gurney A. Alycia Rossetti, MD;  Location: [facility];  Service: Gynecology;  Laterality: Right;  Wide Local Excision of the Vulvar  . VULVECTOMY N/A 02/19/2015   Procedure: SIMPLE PARTIAL VULVECTOMY;  Surgeon: [name], MD;  Location[facility];  Service: Gynecology;  Laterality: N/A;  . VULVECTOMY PARTIAL N/A 10/01/2018   Procedure: VULVECTOMY PARTIAL;  Surgeon: [name], MD;  Location[facility];  Service: Gynecology;  Laterality: N/A;  Current Outpatient Medications  Medication Sig Dispense Refill  . acetaminophen (TYLENOL) 500 MG tablet Take 500 mg by mouth every 6 (six) hours as needed for mild pain.    . Azelastine HCl 0.15 % SOLN USE 1 TO 2 SPRAYS IN EACH  NOSTRIL DAILY 30 mL 5  . budesonide-formoterol (SYMBICORT) 160-4.5 MCG/ACT inhaler Inhale 2 puffs into the lungs 2 (two) times daily.    [name] Kitchen CALCIUM CARBONATE-VITAMIN D PO Take 1 tablet by mouth daily. Patient states this pill is 1200mg  of calcium carbonate and 1000mg  of Vitamin D    . cetirizine (ZYRTEC) 10 MG tablet Take 10 mg by mouth every morning.     . clorazepate (TRANXENE) 7.5 MG tablet Take 1 tablet (7.5 mg total) by mouth 3 (three) times daily. 90 tablet 5  . conjugated estrogens (PREMARIN) vaginal cream Monday and Fridays    . furosemide (LASIX) 40 MG tablet Take 1 tablet (40 mg total) by mouth 2 (two) times daily. 180 tablet 3  . losartan (COZAAR) 25 MG tablet Take 25 mg by mouth daily.     . magic mouthwash SOLN Swish and swallow 5 ml 4 times daily if needed 200 mL 2  . metoprolol succinate (TOPROL-XL) 25 MG 24 hr tablet Take 1 tablet by mouth  daily before breakfast 90 tablet 3  . montelukast  (SINGULAIR) 10 MG tablet montelukast 10 mg tablet  TAKE 1 TABLET BY MOUTH EVERY DAY    . oxyCODONE (OXY IR/ROXICODONE) 5 MG immediate release tablet Take 1 tablet (5 mg total) by mouth every 4 (four) hours as needed for severe pain. 20 tablet 0  . pantoprazole (PROTONIX) 40 MG tablet Take 1 tablet by mouth  twice a day 180 tablet 3  . Polyethyl Glycol-Propyl Glycol (SYSTANE) 0.4-0.3 % SOLN Place 2 drops into both eyes 4 (four) times daily.     . potassium chloride (K-DUR,KLOR-CON) 10 MEQ tablet Take 2 tablets (20 mEq total) by mouth daily. Please keep upcoming appt in October for future refills. Thank you (Patient taking differently: Take 10 mEq by mouth 2 (two) times daily. Please keep upcoming appt in October for future refills. Thank you) 180 tablet 0  . pravastatin (PRAVACHOL) 20 MG tablet Take 20 mg by mouth daily.    . ranitidine (ZANTAC) 150 MG capsule Take 150 mg by mouth 2 (two) times daily.     [name] Kitchen senna (SENOKOT) 8.6 MG TABS tablet Take 1 tablet (8.6 mg total) by mouth at bedtime. 120 each 0  . sennosides-docusate sodium (SENOKOT-S) 8.6-50 MG tablet Take 2 tablets by mouth at bedtime.     Alveda Reasons 20 MG TABS tablet TAKE 1 TABLET BY MOUTH  DAILY 90 tablet 6  . enoxaparin (LOVENOX) 100 MG/ML injection Inject 1 mL (100 mg total) into the skin every 12 (twelve) hours for 2 days. Start 10/19, last dose 10/21 in evening the day before surgery 4 mL 0   No current facility-administered medications for this visit.     Social History   Socioeconomic History  . Marital status: Widowed    Spouse name: Not on file  . Number of children: 4  . Years of education: Not on file  . Highest education level: Some college, no degree  Occupational History  . Occupation: retired  Scientific laboratory technician  . Financial resource strain: Not on file  . Food insecurity:    Worry: Not on file    Inability: Not on file  . Transportation needs:    Medical:  Not on file    Non-medical: Not on file  Tobacco Use  .  Smoking status: Never Smoker  . Smokeless tobacco: Never Used  . Tobacco comment: NEVER USED TOBACCO  Substance and Sexual Activity  . Alcohol use: No    Alcohol/week: 0.0 standard drinks  . Drug use: No  . Sexual activity: Not on file  Lifestyle  . Physical activity:    Days per week: Not on file    Minutes per session: Not on file  . Stress: Not on file  Relationships  . Social connections:    Talks on phone: Not on file    Gets together: Not on file    Attends religious service: Not on file    Active member of club or organization: Not on file    Attends meetings of clubs or organizations: Not on file    Relationship status: Not on file  . Intimate partner violence:    Fear of current or ex partner: Not on file    Emotionally abused: Not on file    Physically abused: Not on file    Forced sexual activity: Not on file  Other Topics Concern  . Not on file  Social History Narrative   Lives alone in an independent living facility.  Has 4 children.  Retired Optometrist for [employer]  Education: some college.     Family History  Problem Relation Age of Onset  . Heart attack Father   . Breast cancer Sister        mets  . Heart attack Brother   . Kidney failure Sister   . Colon cancer Neg Hx   . Esophageal cancer Neg Hx   . Pancreatic cancer Neg Hx   . Liver disease Neg Hx       Thereasa Solo, MD 10/23/2018, 11:48 AM

## 2018-10-28 ENCOUNTER — Ambulatory Visit (INDEPENDENT_AMBULATORY_CARE_PROVIDER_SITE_OTHER): Payer: Medicare Other | Admitting: *Deleted

## 2018-10-28 DIAGNOSIS — I442 Atrioventricular block, complete: Secondary | ICD-10-CM

## 2018-10-28 DIAGNOSIS — I482 Chronic atrial fibrillation, unspecified: Secondary | ICD-10-CM

## 2018-10-28 NOTE — Progress Notes (Signed)
Remote pacemaker transmission.   

## 2018-10-29 ENCOUNTER — Telehealth: Payer: Self-pay | Admitting: Internal Medicine

## 2018-10-29 NOTE — Telephone Encounter (Signed)
New Message         [name] [name] with [facility] is calling today because the patient was reporting SOB and more SOB with activity today. Patient is also complaining of left shoulder pain and radiating down to her left breast. Pls call and advise.

## 2018-10-29 NOTE — Telephone Encounter (Signed)
Duplicate call.  See other phone note.

## 2018-10-29 NOTE — Telephone Encounter (Signed)
Left detailed message per DPR for Pt.  Advised to return this nurse call if she is just having SOB for further management.  Advised if she was having chest pain she should go to the ER.   Left detailed message for [facility] nurse.  Advised this nurse would follow up with Pt.

## 2018-10-29 NOTE — Telephone Encounter (Signed)
New Message:    Magda Paganini from [facility] [employer] calling regarding this patient. The call was lost as I was sending it. Please call Magda Paganini at [phone] (ext) [phone]

## 2018-10-29 NOTE — Telephone Encounter (Signed)
Call back received from Pt.  Per Pt she is feeling much better today.  No further pain or c/o sob.  Pt thinks she was stressed out yesterday.  Took 2 Tylenol's and felt much better.  Advised Pt last remote received and Pt not at ERI yet.  Advised next remote check 11/28/2018.  Advised no other appt's noted.  Will call to schedule appt with Dr. Lovena Le when Pt hits ERI.  Pt indicates understanding.  Thanked nurse for call.

## 2018-11-01 ENCOUNTER — Ambulatory Visit: Payer: Medicare Other | Admitting: Gynecologic Oncology

## 2018-11-16 ENCOUNTER — Encounter

## 2018-11-24 ENCOUNTER — Other Ambulatory Visit: Payer: Self-pay | Admitting: Internal Medicine

## 2018-11-28 ENCOUNTER — Ambulatory Visit (INDEPENDENT_AMBULATORY_CARE_PROVIDER_SITE_OTHER): Payer: Medicare Other

## 2018-11-28 ENCOUNTER — Telehealth: Payer: Self-pay | Admitting: *Deleted

## 2018-11-28 DIAGNOSIS — I442 Atrioventricular block, complete: Secondary | ICD-10-CM

## 2018-11-28 DIAGNOSIS — I482 Chronic atrial fibrillation, unspecified: Secondary | ICD-10-CM

## 2018-11-28 DIAGNOSIS — I5042 Chronic combined systolic (congestive) and diastolic (congestive) heart failure: Secondary | ICD-10-CM

## 2018-11-28 NOTE — Telephone Encounter (Signed)
LVM.sss  ERI as of 10/29/18. Needs appt with GT/EP APP.

## 2018-11-28 NOTE — Telephone Encounter (Signed)
Informed patient that she reached ERI. I told her that the message has been sent to Dr.Taylor and his nurse to see if he would be willing to schedule the procedure without another OV. Patient states that she's had a gen change before and is well aware of the risks/benefits. Patient is ok with scheduling the procedure. Patient states that she would like to have it done between 12/21-1/6, but preferably early Jan. Will forward this information to Chilo and GT for review.

## 2018-11-29 ENCOUNTER — Encounter: Payer: Self-pay | Admitting: Cardiology

## 2018-11-29 NOTE — Progress Notes (Signed)
Remote pacemaker transmission.   

## 2018-11-29 NOTE — Addendum Note (Signed)
Addended by: Douglass Rivers D on: 11/29/2018 01:46 PM   Modules accepted: Level of Service

## 2018-11-29 NOTE — Telephone Encounter (Signed)
Left message to call back  

## 2018-11-29 NOTE — Telephone Encounter (Signed)
Call received from Pt.  Pt indicates understanding of upgrade to biv PPM during her generator change.  Pt states she does not need to have another OV to discuss.  Pt would like to go ahead and schedule upgrade.  Pt scheduled for December 20, 2017 at 7:30 am.  Pt to have labs drawn 12/03/2018.  Will pick up instruction letter and soap at that time.  No further action needed.

## 2018-12-03 ENCOUNTER — Other Ambulatory Visit: Payer: Medicare Other

## 2018-12-03 DIAGNOSIS — I442 Atrioventricular block, complete: Secondary | ICD-10-CM

## 2018-12-03 DIAGNOSIS — I5042 Chronic combined systolic (congestive) and diastolic (congestive) heart failure: Secondary | ICD-10-CM

## 2018-12-03 LAB — CBC WITH DIFFERENTIAL/PLATELET
BASOS ABS: 0.1 10*3/uL (ref 0.0–0.2)
Basos: 1 %
EOS (ABSOLUTE): 0.1 10*3/uL (ref 0.0–0.4)
Eos: 2 %
Hematocrit: 36.5 % (ref 34.0–46.6)
Hemoglobin: 12.4 g/dL (ref 11.1–15.9)
IMMATURE GRANS (ABS): 0 10*3/uL (ref 0.0–0.1)
IMMATURE GRANULOCYTES: 0 %
LYMPHS: 14 %
Lymphocytes Absolute: 1.1 10*3/uL (ref 0.7–3.1)
MCH: 29.7 pg (ref 26.6–33.0)
MCHC: 34 g/dL (ref 31.5–35.7)
MCV: 87 fL (ref 79–97)
Monocytes Absolute: 0.9 10*3/uL (ref 0.1–0.9)
Monocytes: 12 %
NEUTROS PCT: 71 %
Neutrophils Absolute: 5.5 10*3/uL (ref 1.4–7.0)
Platelets: 176 10*3/uL (ref 150–450)
RBC: 4.18 x10E6/uL (ref 3.77–5.28)
RDW: 13.2 % (ref 12.3–15.4)
WBC: 7.7 10*3/uL (ref 3.4–10.8)

## 2018-12-03 LAB — BASIC METABOLIC PANEL
BUN/Creatinine Ratio: 18 (ref 12–28)
BUN: 20 mg/dL (ref 8–27)
CALCIUM: 9.2 mg/dL (ref 8.7–10.3)
CO2: 23 mmol/L (ref 20–29)
Chloride: 104 mmol/L (ref 96–106)
Creatinine, Ser: 1.1 mg/dL — ABNORMAL HIGH (ref 0.57–1.00)
GFR calc Af Amer: 55 mL/min/{1.73_m2} — ABNORMAL LOW (ref >59)
GFR calc non Af Amer: 48 mL/min/{1.73_m2} — ABNORMAL LOW (ref >59)
GLUCOSE: 97 mg/dL (ref 65–99)
POTASSIUM: 4.2 mmol/L (ref 3.5–5.2)
Sodium: 143 mmol/L (ref 134–144)

## 2018-12-04 ENCOUNTER — Other Ambulatory Visit: Payer: Medicare Other

## 2018-12-06 ENCOUNTER — Telehealth: Payer: Self-pay | Admitting: Internal Medicine

## 2018-12-06 NOTE — Telephone Encounter (Signed)
Spoke with Pt.  Per Pt she is having off and on chest pain on left side, not sharp a dull pain.    At first she thought was indigestion and took a tums but that did not help.  Pt takes scheduled Protonix and Zantac.  BP 152/96 pulse 64.  Pt does not have any nitro tabs.  After shared decision making-decided to have Pt take 1000 mg of Tylenol.  Will check back with Pt in a few hours.  If cardiac is cause of pain tylenol should not decrease pain.  Will continue to monitor.

## 2018-12-06 NOTE — Telephone Encounter (Signed)
New Message         Patient c/o Palpitations:  High priority if patient c/o lightheadedness, shortness of breath, or chest pain  1) How long have you had palpitations/irregular HR/ Afib? Are you having the symptoms now? All night and they come and go  2) Are you currently experiencing lightheadedness, SOB or CP? SOB a little bit  3) Do you have a history of afib (atrial fibrillation) or irregular heart rhythm? No  4) Have you checked your BP or HR? (document readings if available): BP 152-96 Pulse 64  5) Are you experiencing any other symptoms? No

## 2018-12-09 NOTE — Telephone Encounter (Signed)
Returned call to Pt.  Per Pt taking Tylenol had reduced chest pain to zero.  Pt has not had any more chest pain.

## 2018-12-09 NOTE — Telephone Encounter (Signed)
Left detailed message.   

## 2018-12-16 ENCOUNTER — Telehealth: Payer: Self-pay | Admitting: Internal Medicine

## 2018-12-16 NOTE — Telephone Encounter (Signed)
Patient is calling to let you know that she got the message to come in to the hospital on Friday at 930am.

## 2018-12-16 NOTE — Telephone Encounter (Signed)
Noted  

## 2018-12-20 ENCOUNTER — Ambulatory Visit (HOSPITAL_COMMUNITY)
Admission: RE | Admit: 2018-12-20 | Discharge: 2018-12-21 | Disposition: A | Discharge status: Home / Self Care | Payer: Medicare Other | Attending: Internal Medicine | Admitting: Internal Medicine

## 2018-12-20 ENCOUNTER — Encounter: Payer: Self-pay | Admitting: Cardiology

## 2018-12-20 ENCOUNTER — Other Ambulatory Visit: Payer: Self-pay

## 2018-12-20 ENCOUNTER — Ambulatory Visit (HOSPITAL_COMMUNITY)
Admission: RE | Disposition: A | Discharge status: Home / Self Care | Payer: Self-pay | Source: Home / Self Care | Attending: Internal Medicine

## 2018-12-20 ENCOUNTER — Encounter (HOSPITAL_COMMUNITY): Payer: Self-pay | Admitting: General Practice

## 2018-12-20 DIAGNOSIS — Z955 Presence of coronary angioplasty implant and graft: Secondary | ICD-10-CM | POA: Insufficient documentation

## 2018-12-20 DIAGNOSIS — I447 Left bundle-branch block, unspecified: Secondary | ICD-10-CM | POA: Insufficient documentation

## 2018-12-20 DIAGNOSIS — D869 Sarcoidosis, unspecified: Secondary | ICD-10-CM | POA: Insufficient documentation

## 2018-12-20 DIAGNOSIS — Z7951 Long term (current) use of inhaled steroids: Secondary | ICD-10-CM | POA: Diagnosis not present

## 2018-12-20 DIAGNOSIS — I5042 Chronic combined systolic (congestive) and diastolic (congestive) heart failure: Secondary | ICD-10-CM | POA: Diagnosis present

## 2018-12-20 DIAGNOSIS — Z79899 Other long term (current) drug therapy: Secondary | ICD-10-CM | POA: Diagnosis not present

## 2018-12-20 DIAGNOSIS — Z881 Allergy status to other antibiotic agents status: Secondary | ICD-10-CM | POA: Insufficient documentation

## 2018-12-20 DIAGNOSIS — K219 Gastro-esophageal reflux disease without esophagitis: Secondary | ICD-10-CM | POA: Insufficient documentation

## 2018-12-20 DIAGNOSIS — I110 Hypertensive heart disease with heart failure: Secondary | ICD-10-CM | POA: Diagnosis present

## 2018-12-20 DIAGNOSIS — Z7901 Long term (current) use of anticoagulants: Secondary | ICD-10-CM | POA: Insufficient documentation

## 2018-12-20 DIAGNOSIS — Z45010 Encounter for checking and testing of cardiac pacemaker pulse generator [battery]: Secondary | ICD-10-CM | POA: Diagnosis not present

## 2018-12-20 DIAGNOSIS — I2510 Atherosclerotic heart disease of native coronary artery without angina pectoris: Secondary | ICD-10-CM | POA: Insufficient documentation

## 2018-12-20 DIAGNOSIS — R55 Syncope and collapse: Secondary | ICD-10-CM | POA: Diagnosis not present

## 2018-12-20 DIAGNOSIS — I872 Venous insufficiency (chronic) (peripheral): Secondary | ICD-10-CM | POA: Diagnosis not present

## 2018-12-20 DIAGNOSIS — Z8249 Family history of ischemic heart disease and other diseases of the circulatory system: Secondary | ICD-10-CM | POA: Insufficient documentation

## 2018-12-20 DIAGNOSIS — I252 Old myocardial infarction: Secondary | ICD-10-CM | POA: Insufficient documentation

## 2018-12-20 DIAGNOSIS — G4733 Obstructive sleep apnea (adult) (pediatric): Secondary | ICD-10-CM | POA: Insufficient documentation

## 2018-12-20 DIAGNOSIS — Z86718 Personal history of other venous thrombosis and embolism: Secondary | ICD-10-CM | POA: Diagnosis not present

## 2018-12-20 DIAGNOSIS — I480 Paroxysmal atrial fibrillation: Secondary | ICD-10-CM | POA: Diagnosis not present

## 2018-12-20 DIAGNOSIS — Z90710 Acquired absence of both cervix and uterus: Secondary | ICD-10-CM | POA: Diagnosis not present

## 2018-12-20 DIAGNOSIS — M1990 Unspecified osteoarthritis, unspecified site: Secondary | ICD-10-CM | POA: Insufficient documentation

## 2018-12-20 DIAGNOSIS — Z888 Allergy status to other drugs, medicaments and biological substances status: Secondary | ICD-10-CM | POA: Diagnosis not present

## 2018-12-20 DIAGNOSIS — E785 Hyperlipidemia, unspecified: Secondary | ICD-10-CM | POA: Diagnosis not present

## 2018-12-20 DIAGNOSIS — Z86711 Personal history of pulmonary embolism: Secondary | ICD-10-CM | POA: Diagnosis not present

## 2018-12-20 DIAGNOSIS — I272 Pulmonary hypertension, unspecified: Secondary | ICD-10-CM | POA: Diagnosis not present

## 2018-12-20 DIAGNOSIS — Z8673 Personal history of transient ischemic attack (TIA), and cerebral infarction without residual deficits: Secondary | ICD-10-CM | POA: Diagnosis not present

## 2018-12-20 DIAGNOSIS — Z950 Presence of cardiac pacemaker: Secondary | ICD-10-CM

## 2018-12-20 DIAGNOSIS — Z882 Allergy status to sulfonamides status: Secondary | ICD-10-CM | POA: Diagnosis not present

## 2018-12-20 DIAGNOSIS — I5022 Chronic systolic (congestive) heart failure: Secondary | ICD-10-CM | POA: Diagnosis present

## 2018-12-20 HISTORY — PX: BIV UPGRADE: EP1202

## 2018-12-20 LAB — CUP PACEART REMOTE DEVICE CHECK
Battery Impedance: 7364 Ohm
Battery Remaining Longevity: 1 mo — CL
Battery Voltage: 2.59 V
Brady Statistic AP VP Percent: 17 %
Brady Statistic AP VS Percent: 1 %
Brady Statistic AS VS Percent: 46 %
Date Time Interrogation Session: 20191108112624
Implantable Lead Implant Date: 20090818
Implantable Lead Implant Date: 20090818
Implantable Lead Location: 753859
Implantable Lead Location: 753860
Implantable Lead Model: 5076
Implantable Pulse Generator Implant Date: 20090818
Lead Channel Impedance Value: 374 Ohm
Lead Channel Impedance Value: 481 Ohm
Lead Channel Pacing Threshold Amplitude: 1.125 V
Lead Channel Pacing Threshold Pulse Width: 0.4 ms
Lead Channel Pacing Threshold Pulse Width: 0.4 ms
Lead Channel Setting Pacing Amplitude: 2 V
Lead Channel Setting Pacing Amplitude: 2.5 V
Lead Channel Setting Pacing Pulse Width: 0.4 ms
Lead Channel Setting Sensing Sensitivity: 2.8 mV
MDC IDC MSMT LEADCHNL RA PACING THRESHOLD AMPLITUDE: 0.5 V
MDC IDC STAT BRADY AS VP PERCENT: 36 %

## 2018-12-20 LAB — SURGICAL PCR SCREEN
MRSA, PCR: NEGATIVE
Staphylococcus aureus: NEGATIVE

## 2018-12-20 SURGERY — BIV UPGRADE

## 2018-12-20 MED ORDER — MUPIROCIN 2 % EX OINT
TOPICAL_OINTMENT | CUTANEOUS | Status: AC
  Administered 2018-12-20: 1 via TOPICAL
  Filled 2018-12-20: qty 22

## 2018-12-20 MED ORDER — VANCOMYCIN HCL IN DEXTROSE 1-5 GM/200ML-% IV SOLN
1000.0000 mg | INTRAVENOUS | Status: AC
  Administered 2018-12-20: 1000 mg via INTRAVENOUS

## 2018-12-20 MED ORDER — FENTANYL CITRATE (PF) 100 MCG/2ML IJ SOLN
INTRAMUSCULAR | Status: AC
  Filled 2018-12-20: qty 2

## 2018-12-20 MED ORDER — PROSIGHT PO TABS
1.0000 | ORAL_TABLET | Freq: Every day | ORAL | Status: DC
  Administered 2018-12-21: 11:00:00 1 via ORAL
  Filled 2018-12-20: qty 1

## 2018-12-20 MED ORDER — MONTELUKAST SODIUM 10 MG PO TABS
10.0000 mg | ORAL_TABLET | Freq: Every day | ORAL | Status: DC
  Administered 2018-12-21: 11:00:00 10 mg via ORAL
  Filled 2018-12-20: qty 1

## 2018-12-20 MED ORDER — SODIUM CHLORIDE 0.9 % IV SOLN
INTRAVENOUS | Status: DC
  Administered 2018-12-20: 10:00:00 via INTRAVENOUS

## 2018-12-20 MED ORDER — SENNA 8.6 MG PO TABS
1.0000 | ORAL_TABLET | Freq: Every day | ORAL | Status: DC
  Administered 2018-12-20: 21:00:00 8.6 mg via ORAL
  Filled 2018-12-20: qty 1

## 2018-12-20 MED ORDER — YOU HAVE A PACEMAKER BOOK
Freq: Once | Status: AC
  Administered 2018-12-21: 07:00:00
  Filled 2018-12-20: qty 1

## 2018-12-20 MED ORDER — MUPIROCIN 2 % EX OINT
1.0000 "application " | TOPICAL_OINTMENT | Freq: Once | CUTANEOUS | Status: AC
  Administered 2018-12-20: 1 via TOPICAL

## 2018-12-20 MED ORDER — ACETAMINOPHEN 325 MG PO TABS
325.0000 mg | ORAL_TABLET | ORAL | Status: DC | PRN
  Administered 2018-12-20: 21:00:00 650 mg via ORAL
  Filled 2018-12-20: qty 2

## 2018-12-20 MED ORDER — VANCOMYCIN HCL IN DEXTROSE 1-5 GM/200ML-% IV SOLN
1000.0000 mg | Freq: Two times a day (BID) | INTRAVENOUS | Status: AC
  Administered 2018-12-20: 1000 mg via INTRAVENOUS
  Filled 2018-12-20: qty 200

## 2018-12-20 MED ORDER — VANCOMYCIN HCL IN DEXTROSE 1-5 GM/200ML-% IV SOLN
INTRAVENOUS | Status: AC
  Filled 2018-12-20: qty 200

## 2018-12-20 MED ORDER — MIDAZOLAM HCL 5 MG/5ML IJ SOLN
INTRAMUSCULAR | Status: DC | PRN
  Administered 2018-12-20 (×5): 1 mg via INTRAVENOUS

## 2018-12-20 MED ORDER — CLORAZEPATE DIPOTASSIUM 3.75 MG PO TABS
7.5000 mg | ORAL_TABLET | Freq: Three times a day (TID) | ORAL | Status: DC
  Administered 2018-12-20 – 2018-12-21 (×3): 7.5 mg via ORAL
  Filled 2018-12-20: qty 1
  Filled 2018-12-20 (×3): qty 2

## 2018-12-20 MED ORDER — POLYETHYL GLYCOL-PROPYL GLYCOL 0.4-0.3 % OP SOLN: 2.0000 [drp] | Freq: Four times a day (QID) | OPHTHALMIC | Status: DC | PRN

## 2018-12-20 MED ORDER — HEPARIN (PORCINE) IN NACL 2-0.9 UNITS/ML
INTRAMUSCULAR | Status: AC | PRN
  Administered 2018-12-20: 500 mL

## 2018-12-20 MED ORDER — CALCIUM CARBONATE 1250 (500 CA) MG PO TABS
1.0000 | ORAL_TABLET | Freq: Every day | ORAL | Status: DC
  Administered 2018-12-21: 500 mg via ORAL
  Filled 2018-12-20: qty 1

## 2018-12-20 MED ORDER — CHLORHEXIDINE GLUCONATE 4 % EX LIQD: 60.0000 mL | Freq: Once | CUTANEOUS | Status: DC

## 2018-12-20 MED ORDER — PANTOPRAZOLE SODIUM 40 MG PO TBEC
40.0000 mg | DELAYED_RELEASE_TABLET | Freq: Two times a day (BID) | ORAL | Status: DC
  Administered 2018-12-20 – 2018-12-21 (×2): 40 mg via ORAL
  Filled 2018-12-20 (×2): qty 1

## 2018-12-20 MED ORDER — ONDANSETRON HCL 4 MG/2ML IJ SOLN: 4.0000 mg | Freq: Four times a day (QID) | INTRAMUSCULAR | Status: DC | PRN

## 2018-12-20 MED ORDER — LIDOCAINE HCL (PF) 1 % IJ SOLN
INTRAMUSCULAR | Status: DC | PRN
  Administered 2018-12-20: 60 mL

## 2018-12-20 MED ORDER — SODIUM CHLORIDE 0.9 % IV SOLN
INTRAVENOUS | Status: AC
  Filled 2018-12-20: qty 2

## 2018-12-20 MED ORDER — PRAVASTATIN SODIUM 10 MG PO TABS
20.0000 mg | ORAL_TABLET | Freq: Every day | ORAL | Status: DC
  Administered 2018-12-20: 21:00:00 20 mg via ORAL
  Filled 2018-12-20: qty 2

## 2018-12-20 MED ORDER — FUROSEMIDE 40 MG PO TABS
40.0000 mg | ORAL_TABLET | Freq: Two times a day (BID) | ORAL | Status: DC
  Administered 2018-12-20 – 2018-12-21 (×2): 40 mg via ORAL
  Filled 2018-12-20 (×2): qty 1

## 2018-12-20 MED ORDER — LIDOCAINE HCL 1 % IJ SOLN
INTRAMUSCULAR | Status: AC
  Filled 2018-12-20: qty 60

## 2018-12-20 MED ORDER — FENTANYL CITRATE (PF) 100 MCG/2ML IJ SOLN
INTRAMUSCULAR | Status: DC | PRN
  Administered 2018-12-20 (×5): 12.5 ug via INTRAVENOUS

## 2018-12-20 MED ORDER — MIDAZOLAM HCL 5 MG/5ML IJ SOLN
INTRAMUSCULAR | Status: AC
  Filled 2018-12-20: qty 5

## 2018-12-20 MED ORDER — LOSARTAN POTASSIUM 50 MG PO TABS
25.0000 mg | ORAL_TABLET | Freq: Every day | ORAL | Status: DC
  Administered 2018-12-21: 11:00:00 25 mg via ORAL
  Filled 2018-12-20: qty 1

## 2018-12-20 MED ORDER — ACETAMINOPHEN 500 MG PO TABS: 1000.0000 mg | ORAL_TABLET | Freq: Two times a day (BID) | ORAL | Status: DC | PRN

## 2018-12-20 MED ORDER — POLYVINYL ALCOHOL 1.4 % OP SOLN: 2.0000 [drp] | OPHTHALMIC | Status: DC | PRN

## 2018-12-20 MED ORDER — IOPAMIDOL (ISOVUE-370) INJECTION 76%
INTRAVENOUS | Status: DC | PRN
  Administered 2018-12-20: 12 mL
  Administered 2018-12-20: 15 mL via INTRAVENOUS

## 2018-12-20 MED ORDER — HEPARIN (PORCINE) IN NACL 1000-0.9 UT/500ML-% IV SOLN
INTRAVENOUS | Status: AC
  Filled 2018-12-20: qty 500

## 2018-12-20 MED ORDER — CALCIUM 1200 1200-1000 MG-UNIT PO CHEW: 1.0000 | CHEWABLE_TABLET | Freq: Every day | ORAL | Status: DC

## 2018-12-20 MED ORDER — SODIUM CHLORIDE 0.9 % IV SOLN
80.0000 mg | INTRAVENOUS | Status: AC
  Administered 2018-12-20: 80 mg

## 2018-12-20 SURGICAL SUPPLY — 13 items
ADAPTER SEALING SSA-EW-09 (MISCELLANEOUS) ×3 IMPLANT
CABLE SURGICAL S-101-97-12 (CABLE) ×3 IMPLANT
CATH ATTAIN COM SURV 6250V-MB2 (CATHETERS) ×3 IMPLANT
CATH HEX JOS 2-5-2 65CM 6F REP (CATHETERS) ×3 IMPLANT
DEVICE CRTP PERCEPTA QUAD MRI (Pacemaker) ×3 IMPLANT
GUIDEWIRE ANGLED .035X150CM (WIRE) ×3 IMPLANT
LEAD ATTAIN PERFORMA S 4598-88 (Lead) ×3 IMPLANT
PAD PRO RADIOLUCENT 2001M-C (PAD) ×3 IMPLANT
SHEATH CLASSIC 9.5F (SHEATH) ×3 IMPLANT
SHIELD RADPAD SCOOP 12X17 (MISCELLANEOUS) ×3 IMPLANT
SLITTER 6232ADJ (MISCELLANEOUS) ×3 IMPLANT
TRAY PACEMAKER INSERTION (PACKS) ×3 IMPLANT
WIRE SION BLUE 180 (WIRE) ×3 IMPLANT

## 2018-12-20 NOTE — Discharge Instructions (Signed)
° ° °  Supplemental Discharge Instructions for  Pacemaker/Defibrillator Patients  Activity No heavy lifting or vigorous activity with your left/right arm for 6 to 8 weeks.  Do not raise your left/right arm above your head for one week.  Gradually raise your affected arm as drawn below.              12/24/18                      12/25/18                       12/26/18                    12/27/18 __  NO DRIVING for  1 week   ; you may begin driving on  2/62/03   .  WOUND CARE - Keep the wound area clean and dry.  Do not get this area wet for one week. No showers for one week; you may shower on 12/27/18 . - The tape/steri-strips on your wound will fall off; do not pull them off.  No bandage is needed on the site.  DO  NOT apply any creams, oils, or ointments to the wound area. - If you notice any drainage or discharge from the wound, any swelling or bruising at the site, or you develop a fever > 101? F after you are discharged home, call the office at once.  Special Instructions - You are still able to use cellular telephones; use the ear opposite the side where you have your pacemaker/defibrillator.  Avoid carrying your cellular phone near your device. - When traveling through airports, show security personnel your identification card to avoid being screened in the metal detectors.  Ask the security personnel to use the hand wand. - Avoid arc welding equipment, MRI testing (magnetic resonance imaging), TENS units (transcutaneous nerve stimulators).  Call the office for questions about other devices. - Avoid electrical appliances that are in poor condition or are not properly grounded. - Microwave ovens are safe to be near or to operate.

## 2018-12-20 NOTE — H&P (Signed)
HPI [name] [name] returns today for followup of her chronic systolic heart failure, high grade heart block, s/p PPM insertion, LBBB, and HTN. She has had improvement in her dyspnea but is still class 3A. She is approaching ERI on her DDD PM. She is pending surgery.  Her EF is 40% by echo. She has not had syncope. She admits to sodium indiscretion and peripheral edema.      Allergies  Allergen Reactions  . Cephalexin Other (See Comments)    diarrhea  . Imitrex [Sumatriptan] Other (See Comments)    Reaction unknown  . Sulfa Antibiotics Nausea Only    Doesn't like to drink lots of water.           Current Outpatient Medications  Medication Sig Dispense Refill  . acetaminophen (TYLENOL) 500 MG tablet Take 500 mg by mouth every 6 (six) hours as needed for mild pain.    . Azelastine HCl 0.15 % SOLN USE 1 TO 2 SPRAYS IN EACH  NOSTRIL DAILY 30 mL 5  . budesonide-formoterol (SYMBICORT) 160-4.5 MCG/ACT inhaler Inhale 2 puffs into the lungs 2 (two) times daily.    [name] Kitchen CALCIUM CARBONATE-VITAMIN D PO Take 1 tablet by mouth daily. Patient states this pill is 1200mg  of calcium carbonate and 1000mg  of Vitamin D    . cetirizine (ZYRTEC) 10 MG tablet Take 10 mg by mouth every morning.     . clorazepate (TRANXENE) 7.5 MG tablet Take 1 tablet (7.5 mg total) by mouth 3 (three) times daily. 90 tablet 5  . conjugated estrogens (PREMARIN) vaginal cream Premarin 0.625 mg/gram vaginal cream  INSERT 1/2 (ONE HALF) GRAM VAGINALLY TWICE WEEKLY    . [START ON 10/05/2018] enoxaparin (LOVENOX) 100 MG/ML injection Inject 1 mL (100 mg total) into the skin every 12 (twelve) hours. Start 10/19, last dose 10/21 in evening the day before surgery 6 Syringe 0  . fluticasone (FLOVENT HFA) 44 MCG/ACT inhaler Inhale 2 puffs into the lungs 2 (two) times daily. 1 Inhaler 5  . furosemide (LASIX) 40 MG tablet Take 1 tablet (40 mg total) by mouth 2 (two) times daily. 180 tablet 3  . losartan (COZAAR) 25 MG  tablet Take 25 mg by mouth daily.     . magic mouthwash SOLN Swish and swallow 5 ml 4 times daily if needed 200 mL 2  . metoprolol succinate (TOPROL-XL) 25 MG 24 hr tablet Take 1 tablet by mouth  daily before breakfast 90 tablet 3  . montelukast (SINGULAIR) 10 MG tablet montelukast 10 mg tablet  TAKE 1 TABLET BY MOUTH EVERY DAY    . Multiple Vitamins-Minerals (PROTEGRA PO) Take by mouth daily.     . pantoprazole (PROTONIX) 40 MG tablet Take 1 tablet by mouth  twice a day 180 tablet 3  . Polyethyl Glycol-Propyl Glycol (SYSTANE) 0.4-0.3 % SOLN Place 2 drops into both eyes 4 (four) times daily.     . potassium chloride (K-DUR,KLOR-CON) 10 MEQ tablet Take 2 tablets (20 mEq total) by mouth daily. Please keep upcoming appt in October for future refills. Thank you 180 tablet 0  . pravastatin (PRAVACHOL) 20 MG tablet Take 20 mg by mouth daily.    . ranitidine (ZANTAC) 150 MG capsule Take 150 mg by mouth 2 (two) times daily.     . sennosides-docusate sodium (SENOKOT-S) 8.6-50 MG tablet Take 2 tablets by mouth at bedtime.     Alveda Reasons 20 MG TABS tablet TAKE 1 TABLET BY MOUTH  DAILY 90 tablet 6  No current facility-administered medications for this visit.          Past Medical History:  Diagnosis Date  . Allergic rhinitis   . Anxiety   . Cardiac pacemaker in situ    DOI  08-04-2008  medtronic dual chamber  . Chronic anticoagulation   . Chronic combined systolic and diastolic CHF, NYHA class 2 ([place])    montior by cardiologist-- dr Sharmarke Cicio--  ef 45%  . Coronary artery disease CARDIOLOGIST-- DR Cristopher Peru   per cath-- Non-obstructive minimal CAD  . Fluid retention   . GERD (gastroesophageal reflux disease)   . History of cervical dysplasia    CIN III  . History of CVA (cerebrovascular accident)    09-15-2005  Left Cerebellar Hematoma with mild stem compression whild on coumadin for DVT  . History of DVT of lower extremity   . History of non-ST elevation  myocardial infarction (NSTEMI)    2011-- in setting of bilateral PE's and DVT's and CHF  . History of peptic ulcer disease   . History of pulmonary embolus (PE)   . Hyperlipidemia   . Hypertension   . Incontinence of urine   . LBBB (left bundle branch block)   . Mild obstructive sleep apnea    PT USES Humidied Nasal Mask with O2 at2.5L (study 08-05-2009)  . Mild pulmonary hypertension ([place])   . Moderate intermittent asthma   . OA (osteoarthritis)   . PAF (paroxysmal atrial fibrillation) ([place])   . Renal insufficiency   . Sarcoidosis   . Shortness of breath    OCCASIONAL  . Thrombophilia ([place])    hemotologist--  dr Marin Olp--  monitors pt anitcoagulation  . Urgency of urination   . Venous insufficiency   . VIN III (vulvar intraepithelial neoplasia III)     ROS:   All systems reviewed and negative except as noted in the HPI.        Past Surgical History:  Procedure Laterality Date  . ABDOMINAL HYSTERECTOMY  1970's  . APPENDECTOMY  1966  . CARDIAC CATHETERIZATION  08-26-2003   dr Lorenz Coaster. non-obstructive CAD & LVD with ef 46%, global HK, 2+MR, PA press 50/24 w/ wedge 20  . CARDIAC CATHETERIZATION  08-03-2008   dr Angelena Form   Non-obstructive CAD/  30-40% pLAD,  30% ostial CFX/  symptomatic bradycardia/  normal renal arteries  . CARDIAC CATHETERIZATION N/A 07/21/2016   Procedure: Right Heart Cath;  Surgeon: Larey Dresser, MD;  Location: [facility];  Service: Cardiovascular;  Laterality: N/A;  . CARDIAC PACEMAKER PLACEMENT  08-04-2008  dr Lovena Le Chalise Pe   for [facility]--  MEDTRONIC DUAL CHAMBER  . CATARACTS REMOVED    . CHOLECYSTECTOMY  01/1996  . DECCOMPRESSION ULNAR NERVE AT CUBITAL TUNNEL AND RESECTION MUSCLE Left 03-17-2008  . EXCISION LEFT SUPRACLAVICULAR LYMPH NODE   04-22-2007   granulomatous inflammation, extensive  . INSERTION OF VENA CAVA FILTER  08/ 2011  . KNEE ARTHROSCOPY     LEFT  . LUMBAR [place] SURGERY  x2    1990  . SHOULDER ARTHROSCOPY WITH ROTATOR CUFF REPAIR AND SUBACROMIAL DECOMPRESSION Right 02-08-2009  . TOTAL KNEE ARTHROPLASTY Left 04/21/2013   Procedure: LEFT TOTAL KNEE ARTHROPLASTY;  Surgeon: Gearlean Alf, MD;  Location: [facility];  Service: Orthopedics;  Laterality: Left;  . TOTAL KNEE ARTHROPLASTY Right 04/03/2016   Procedure: RIGHT TOTAL KNEE ARTHROPLASTY;  Surgeon: Gaynelle Arabian, MD;  Location: [facility];  Service: Orthopedics;  Laterality: Right;  . TRANSTHORACIC ECHOCARDIOGRAM  last one 05-19-2014   mild LVH/  ef 45-50%/  septal-lateral dyssynchroncy consistant with LBBB/  diffuse hypokinesis/  grade I diastolic dysfunction/  trivial MR and TR/  mild LAE  . TUBAL LIGATION    . VULVAR LESION REMOVAL  07/16/2012   Procedure: VULVAR LESION;  Surgeon: Imagene Gurney A. Alycia Rossetti, MD;  Location: [facility];  Service: Gynecology;  Laterality: Right;  Wide Local Excision of the Vulvar  . VULVECTOMY N/A 02/19/2015   Procedure: SIMPLE PARTIAL VULVECTOMY;  Surgeon: [name], MD;  Location: [facility];  Service: Gynecology;  Laterality: N/A;          Family History  Problem Relation Age of Onset  . Heart attack Father   . Breast cancer Sister        mets  . Heart attack Brother   . Kidney failure Sister   . Colon cancer Neg Hx   . Esophageal cancer Neg Hx   . Pancreatic cancer Neg Hx   . Liver disease Neg Hx      Social History        Socioeconomic History  . Marital status: Widowed    Spouse name: Not on file  . Number of children: 4  . Years of education: Not on file  . Highest education level: Some college, no degree  Occupational History  . Occupation: retired  Scientific laboratory technician  . Financial resource strain: Not on file  . Food insecurity:    Worry: Not on file    Inability: Not on file  . Transportation needs:    Medical: Not on file    Non-medical: Not on file  Tobacco Use  . Smoking status: Never Smoker  . Smokeless tobacco: Never Used   . Tobacco comment: NEVER USED TOBACCO  Substance and Sexual Activity  . Alcohol use: No    Alcohol/week: 0.0 standard drinks  . Drug use: No  . Sexual activity: Not on file  Lifestyle  . Physical activity:    Days per week: Not on file    Minutes per session: Not on file  . Stress: Not on file  Relationships  . Social connections:    Talks on phone: Not on file    Gets together: Not on file    Attends religious service: Not on file    Active member of club or organization: Not on file    Attends meetings of clubs or organizations: Not on file    Relationship status: Not on file  . Intimate partner violence:    Fear of current or ex partner: Not on file    Emotionally abused: Not on file    Physically abused: Not on file    Forced sexual activity: Not on file  Other Topics Concern  . Not on file  Social History Narrative   Lives alone in an independent living facility.  Has 4 children.  Retired Optometrist for [employer]  Education: some college.      BP 128/78   Pulse 90   Ht 5' 2.5" (1.588 m)   Wt 217 lb 6.4 oz (98.6 kg)   SpO2 95%   BMI 39.13 kg/m   Physical Exam:  Well appearing 83 yo woman, overweight, NAD HEENT: Unremarkable Neck:  6 cm JVD, no thyromegally Lymphatics:  No adenopathy Back:  No CVA tenderness Lungs:  Clear with no wheezes HEART:  Regular rate rhythm, no murmurs, no rubs, no clicks Abd:  soft, positive bowel sounds, no organomegally, no rebound, no guarding  Ext:  2 plus pulses, no edema, no cyanosis, no clubbing Skin:  No rashes no nodules Neuro:  CN II through XII intact, motor grossly intact  EKG - none  DEVICE  Normal device function.  See PaceArt for details. Approaching ERI.  Assess/Plan: 1. [place] syncope - she is asymptomatic, s/p PPM insertion. 2. PPM - Her medtronic device is approaching ERI.  3. Chronic systolic heart failure - she is class 3. Her EF is 40% by echo. I have recommended we  plan for upgrade to a Biv PPM when she reaches ERI. 4. PAF - she is maintaining NSR very nicely  Carleene Overlie Herson Prichard,M.D.  EP Attending  Patient seen and examined. Agree with the findings as noted above. The patient presents today for biv PPM upgrade. No change since her last clinic visit.   Mikle Bosworth.D.

## 2018-12-21 ENCOUNTER — Ambulatory Visit (HOSPITAL_COMMUNITY): Payer: Medicare Other

## 2018-12-21 DIAGNOSIS — I447 Left bundle-branch block, unspecified: Secondary | ICD-10-CM | POA: Diagnosis not present

## 2018-12-21 DIAGNOSIS — I480 Paroxysmal atrial fibrillation: Secondary | ICD-10-CM | POA: Diagnosis not present

## 2018-12-21 DIAGNOSIS — I5042 Chronic combined systolic (congestive) and diastolic (congestive) heart failure: Secondary | ICD-10-CM | POA: Diagnosis not present

## 2018-12-21 DIAGNOSIS — I110 Hypertensive heart disease with heart failure: Secondary | ICD-10-CM | POA: Diagnosis not present

## 2018-12-21 MED ORDER — RIVAROXABAN 20 MG PO TABS: 20.0000 mg | ORAL_TABLET | Freq: Every day | ORAL | 6 refills | Status: AC

## 2018-12-21 MED ORDER — ACETAMINOPHEN 325 MG PO TABS: 325.0000 mg | ORAL_TABLET | ORAL | Status: AC | PRN

## 2018-12-21 NOTE — Progress Notes (Signed)
Progress Note  Patient Name: [name] [name] Date of Encounter: 12/21/2018  Primary Cardiologist: No primary care provider on file.   Subjective   No chest pain or sob. Minimal incisional pain.   Inpatient Medications    Scheduled Meds: . calcium carbonate  1 tablet Oral Daily  . clorazepate  7.5 mg Oral TID  . furosemide  40 mg Oral BID  . losartan  25 mg Oral Daily  . montelukast  10 mg Oral Daily  . multivitamin  1 tablet Oral Daily  . pantoprazole  40 mg Oral BID  . pravastatin  20 mg Oral QHS  . senna  1 tablet Oral QHS   Continuous Infusions:  PRN Meds: acetaminophen, ondansetron (ZOFRAN) IV, polyvinyl alcohol   Vital Signs    Vitals:   12/20/18 1825 12/20/18 2148 12/21/18 0651 12/21/18 0815  BP: (!) 96/54 (!) 117/52 (!) 150/80 (!) 159/80  Pulse:  72 79 77  Resp: 18 19 17    Temp:  98.2 F (36.8 C) 97.7 F (36.5 C) 98.1 F (36.7 C)  TempSrc:  Oral Oral Oral  SpO2: 97% 94% 96%   Weight:   95 kg   Height:        Intake/Output Summary (Last 24 hours) at 12/21/2018 1022 Last data filed at 12/21/2018 0834 Gross per 24 hour  Intake 480 ml  Output 1450 ml  Net -970 ml   Filed Weights   12/20/18 0939 12/21/18 0651  Weight: 96.7 kg 95 kg    Telemetry    NSR with biv pacing - Personally Reviewed  ECG    nsr with biv pacing - Personally Reviewed  Physical Exam   GEN: No acute distress.   Neck: No JVD Cardiac: RRR, no murmurs, rubs, or gallops.  Respiratory: Clear to auscultation bilaterally. Incision without hematoma. GI: Soft, nontender, non-distended  MS: No edema; No deformity. Neuro:  Nonfocal  Psych: Normal affect   Labs    ChemistryNo results for input(s): NA, K, CL, CO2, GLUCOSE, BUN, CREATININE, CALCIUM, PROT, ALBUMIN, AST, ALT, ALKPHOS, BILITOT, GFRNONAA, GFRAA, ANIONGAP in the last 168 hours.   HematologyNo results for input(s): WBC, RBC, HGB, HCT, MCV, MCH, MCHC, RDW, PLT in the last 168 hours.  Cardiac EnzymesNo results for  input(s): TROPONINI in the last 168 hours. No results for input(s): TROPIPOC in the last 168 hours.   BNPNo results for input(s): BNP, PROBNP in the last 168 hours.   DDimer No results for input(s): DDIMER in the last 168 hours.   Radiology    Dg Chest 2 View  Result Date: 12/21/2018 CLINICAL DATA:  83 year old female status post pacemaker revision EXAM: CHEST - 2 VIEW COMPARISON:  Prior chest x-ray 07/12/2018 FINDINGS: Interval revision of cardiac rhythm maintenance device. There is a new generator overlying the left chest. The new device is biventricular, a lead now projects overlying the left ventricle, presumably in a cardiac vein. Additional leads project over the right atrium and right ventricle. Stable cardiac and mediastinal contours. No evidence of pneumothorax or large pleural effusion. Slightly increased interstitial prominence now with Kerley B-lines in the periphery of the lungs. Stable bronchitic changes and bibasilar atelectasis versus scarring. No acute osseous abnormality. IMPRESSION: Interval revision to a biventricular cardiac rhythm maintenance device as detailed. Slightly increased pulmonary vascular congestion with mild interstitial edema. Electronically Signed   By: Jacqulynn Cadet M.D.   On: 12/21/2018 08:17    Cardiac Studies   none  Patient Profile  83 y.o. female admitted for biv ppm upgrade  Assessment & Plan    1. biv upgrade - she is stable. He LV lead has retracted a bit but has been reprogrammed and appears to be working ok. We will follow. I encouraged her not to move her arm much.  2. Atrial fib - she is maintaining NSR.  3. Chronic systolic heart failure - her symptoms are controlled. I suspect she will improve if her lead will remain in the LV. 4. Disp. - she will be discharged home. I have asked her to start her Xarelto on Monday night.  For questions or updates, please contact [place] Please consult [email or web address] for contact info under  Cardiology/STEMI.      Signed, Cristopher Peru, MD  12/21/2018, 10:22 AM  Patient ID: [name] [name], female   DOB: 1936-02-23, 83 y.o.   MRN: [id number]

## 2018-12-21 NOTE — Discharge Summary (Addendum)
Discharge Summary    Patient ID: [name] [name] MRN: [id number]; DOB: 16-Feb-1936  Admit date: 12/20/2018 Discharge date: 12/21/2018  Primary Care Provider: Maurice Small, MD  Primary Cardiologist: No primary care provider on file.  Primary Electrophysiologist:  Dr Lovena Le  Discharge Diagnoses    1. 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 PPM - Her medtronic device is approaching ERI  upgraded this admission to BiV PPM.   3. Chronic systolic heart failure - she is class 3. Her EF is 40% by echo. NICM  4. PAF - she is maintaining NSR very nicely  5. Chronic anticoagulation- Xarelto  6. LBBB  7. HTN- B/P borderline low  8. Non obstructive CAD-last cath 2017   Allergies Allergies  Allergen Reactions  . Cephalexin Diarrhea  . Imitrex [Sumatriptan] Other (See Comments)    Reaction unknown  . Sulfa Antibiotics Nausea Only    Doesn't like to drink lots of water.    Diagnostic Studies/Procedures    Pacemaker insertion 12/20/18 _____________   History of Present Illness     83 y.o. female with chronic systolic heart failure, high grade heart block, s/p PPM insertion, LBBB, and HTN. Admitted for ERI- planned [facility]      Pleasant 83 y/o female followed by Dr Lovena Le with a history of NICM and prior MDT PTVDP for high grade heart block, LBBB, and HTN. She is felt to be NYHA class 3A. She is approaching ERI on her DDD PM. Her last EF was 40% by echo July 2019. She has chronic LBBB and Dr Lovena Le felt she would benefit from an upgrade to Bi PPM since she was due for a generator change.  This was done 12/20/18 and the patient tolerated it well. Dr Lovena Le saw her on the morning of 12/21/18 and felt she could be discharged.  He reviewed her CXR before discharge.  The pt's Eliquis will be resumed 12/23/18 PM.  _____________  Discharge Vitals Blood pressure (!) 159/80, pulse 77, temperature 98.1 F (36.7 C), temperature source Oral, resp. rate 17,  height 5' 2.5" (1.588 m), weight 95 kg, SpO2 96 %.  Filed Weights   12/20/18 0939 12/21/18 0651  Weight: 96.7 kg 95 kg    Labs & Radiologic Studies    CBC No results for input(s): WBC, NEUTROABS, HGB, HCT, MCV, PLT in the last 72 hours. Basic Metabolic Panel No results for input(s): NA, K, CL, CO2, GLUCOSE, BUN, CREATININE, CALCIUM, MG, PHOS in the last 72 hours. Liver Function Tests No results for input(s): AST, ALT, ALKPHOS, BILITOT, PROT, ALBUMIN in the last 72 hours. No results for input(s): LIPASE, AMYLASE in the last 72 hours. Cardiac Enzymes No results for input(s): CKTOTAL, CKMB, CKMBINDEX, TROPONINI in the last 72 hours. BNP Invalid input(s): POCBNP D-Dimer No results for input(s): DDIMER in the last 72 hours. Hemoglobin A1C No results for input(s): HGBA1C in the last 72 hours. Fasting Lipid Panel No results for input(s): CHOL, HDL, LDLCALC, TRIG, CHOLHDL, LDLDIRECT in the last 72 hours. Thyroid Function Tests No results for input(s): TSH, T4TOTAL, T3FREE, THYROIDAB in the last 72 hours.  Invalid input(s): FREET3 _____________  Dg Chest 2 View  Result Date: 12/21/2018 CLINICAL DATA:  83 year old female status post pacemaker revision EXAM: CHEST - 2 VIEW COMPARISON:  Prior chest x-ray 07/12/2018 FINDINGS: Interval revision of cardiac rhythm maintenance device. There is a new generator overlying the left chest. The new device is biventricular, a lead now projects overlying the left  ventricle, presumably in a cardiac vein. Additional leads project over the right atrium and right ventricle. Stable cardiac and mediastinal contours. No evidence of pneumothorax or large pleural effusion. Slightly increased interstitial prominence now with Kerley B-lines in the periphery of the lungs. Stable bronchitic changes and bibasilar atelectasis versus scarring. No acute osseous abnormality. IMPRESSION: Interval revision to a biventricular cardiac rhythm maintenance device as detailed. Slightly  increased pulmonary vascular congestion with mild interstitial edema. Electronically Signed   By: Jacqulynn Cadet M.D.   On: 12/21/2018 08:17   Disposition   Pt is being discharged home today in good condition.  Follow-up Plans & Appointments    Follow-up Information    [facility] Follow up.   Specialty:  Cardiology Why:  01/01/19 @ 10:00AM, wound check visit Contact information: [address] [place]       Evans Lance, MD Follow up.   Specialty:  Cardiology Why:  You will be called by Dr. Tanna Furry scheduler to make a 3 month office visit appointment Contact information: [address] [place] [address] [phone]            Discharge Medications   Allergies as of 12/21/2018      Reactions   Cephalexin Diarrhea   Imitrex [sumatriptan] Other (See Comments)   Reaction unknown   Sulfa Antibiotics Nausea Only   Doesn't like to drink lots of water.      Medication List    STOP taking these medications   enoxaparin 100 MG/ML injection Commonly known as:  LOVENOX   ranitidine 150 MG capsule Commonly known as:  ZANTAC     TAKE these medications   acetaminophen 325 MG tablet Commonly known as:  TYLENOL Take 1-2 tablets (325-650 mg total) by mouth every 4 (four) hours as needed for mild pain. What changed:    medication strength  how much to take  when to take this   Azelastine HCl 0.15 % Soln USE 1 TO 2 SPRAYS IN EACH  NOSTRIL DAILY What changed:    when to take this  reasons to take this   CALCIUM 1200 1200-1000 MG-UNIT Chew Chew 1 tablet by mouth daily.   cetirizine 10 MG tablet Commonly known as:  ZYRTEC Take 10 mg by mouth every morning.   clorazepate 7.5 MG tablet Commonly known as:  TRANXENE Take 1 tablet (7.5 mg total) by mouth 3 (three) times daily.   furosemide 40 MG tablet Commonly known as:  LASIX Take 1 tablet (40 mg total) by  mouth 2 (two) times daily.   losartan 25 MG tablet Commonly known as:  COZAAR Take 25 mg by mouth daily.   magic mouthwash Soln Swish and swallow 5 ml 4 times daily if needed What changed:    how much to take  how to take this  when to take this  reasons to take this  additional instructions   metoprolol succinate 25 MG 24 hr tablet Commonly known as:  TOPROL-XL Take 1 tablet by mouth  daily before breakfast What changed:  See the new instructions.   montelukast 10 MG tablet Commonly known as:  SINGULAIR Take 10 mg by mouth daily.   oxyCODONE 5 MG immediate release tablet Commonly known as:  Oxy IR/ROXICODONE Take 1 tablet (5 mg total) by mouth every 4 (four) hours as needed for severe pain.   pantoprazole 40 MG tablet Commonly known as:  PROTONIX Take 1  tablet by mouth  twice a day   potassium chloride 10 MEQ tablet Commonly known as:  K-DUR,KLOR-CON TAKE 2 TABLETS BY MOUTH  DAILY What changed:    how much to take  when to take this   pravastatin 20 MG tablet Commonly known as:  PRAVACHOL Take 20 mg by mouth at bedtime.   PROTEGRA Caps Take 1 capsule by mouth daily.   rivaroxaban 20 MG Tabs tablet Commonly known as:  XARELTO Take 1 tablet (20 mg total) by mouth daily. Start taking on:  December 23, 2018 What changed:    how much to take  These instructions start on December 23, 2018. If you are unsure what to do until then, ask your doctor or other care provider.   senna 8.6 MG Tabs tablet Commonly known as:  SENOKOT Take 1 tablet (8.6 mg total) by mouth at bedtime. What changed:  how much to take   SYMBICORT 160-4.5 MCG/ACT inhaler Generic drug:  budesonide-formoterol Inhale 2 puffs into the lungs 2 (two) times daily.   SYSTANE 0.4-0.3 % Soln Generic drug:  Polyethyl Glycol-Propyl Glycol Place 2 drops into both eyes 4 (four) times daily as needed (dryness).        Acute coronary syndrome (MI, NSTEMI, STEMI, etc) this admission?: No.      Outstanding Labs/Studies     Duration of Discharge Encounter   Greater than 30 minutes including physician time.  Signed, Kerin Ransom, PA-C 12/21/2018, 10:24 AM   EP Attending  Patient seen and examined. Agree with the findings as noted above. The patient is stable for DC. Her PPM interogation under my supervision demonstrates good funciton in the distal lead only due to retraction of the LV lead. Hopefully it will remain in its current position. She will be discharged home with usual followup.  Mikle Bosworth.D.

## 2018-12-21 NOTE — Progress Notes (Addendum)
Progress Note  Patient Name: [name] [name] Date of Encounter: 12/21/2018  Primary Cardiologist: Dr Lovena Le  Subjective   No complaints this am  Inpatient Medications    Scheduled Meds: . calcium carbonate  1 tablet Oral Daily  . clorazepate  7.5 mg Oral TID  . furosemide  40 mg Oral BID  . losartan  25 mg Oral Daily  . montelukast  10 mg Oral Daily  . multivitamin  1 tablet Oral Daily  . pantoprazole  40 mg Oral BID  . pravastatin  20 mg Oral QHS  . senna  1 tablet Oral QHS   Continuous Infusions:  PRN Meds: acetaminophen, ondansetron (ZOFRAN) IV, polyvinyl alcohol   Vital Signs    Vitals:   12/20/18 1825 12/20/18 2148 12/21/18 0651 12/21/18 0815  BP: (!) 96/54 (!) 117/52 (!) 150/80 (!) 159/80  Pulse:  72 79 77  Resp: 18 19 17    Temp:  98.2 F (36.8 C) 97.7 F (36.5 C) 98.1 F (36.7 C)  TempSrc:  Oral Oral Oral  SpO2: 97% 94% 96%   Weight:   95 kg   Height:        Intake/Output Summary (Last 24 hours) at 12/21/2018 0929 Last data filed at 12/21/2018 0834 Gross per 24 hour  Intake 480 ml  Output 1450 ml  Net -970 ml   Filed Weights   12/20/18 0939 12/21/18 0651  Weight: 96.7 kg 95 kg    Telemetry    NSR V paced Personally Reviewed  ECG    NSR-V-paced- Personally Reviewed  Physical Exam   GEN: No acute distress.   Neck: No JVD Cardiac: RRR, no murmurs, rubs, or gallops.  Respiratory: Clear to auscultation bilaterally. GI: Soft, nontender, non-distended  MS: No edema; No deformity. Neuro:  Nonfocal  Psych: Normal affect   Labs    ChemistryNo results for input(s): NA, K, CL, CO2, GLUCOSE, BUN, CREATININE, CALCIUM, PROT, ALBUMIN, AST, ALT, ALKPHOS, BILITOT, GFRNONAA, GFRAA, ANIONGAP in the last 168 hours.   HematologyNo results for input(s): WBC, RBC, HGB, HCT, MCV, MCH, MCHC, RDW, PLT in the last 168 hours.  Cardiac EnzymesNo results for input(s): TROPONINI in the last 168 hours. No results for input(s): TROPIPOC in the last 168 hours.    BNPNo results for input(s): BNP, PROBNP in the last 168 hours.   DDimer No results for input(s): DDIMER in the last 168 hours.   Radiology    Dg Chest 2 View  Result Date: 12/21/2018 CLINICAL DATA:  83 year old female status post pacemaker revision EXAM: CHEST - 2 VIEW COMPARISON:  Prior chest x-ray 07/12/2018 FINDINGS: Interval revision of cardiac rhythm maintenance device. There is a new generator overlying the left chest. The new device is biventricular, a lead now projects overlying the left ventricle, presumably in a cardiac vein. Additional leads project over the right atrium and right ventricle. Stable cardiac and mediastinal contours. No evidence of pneumothorax or large pleural effusion. Slightly increased interstitial prominence now with Kerley B-lines in the periphery of the lungs. Stable bronchitic changes and bibasilar atelectasis versus scarring. No acute osseous abnormality. IMPRESSION: Interval revision to a biventricular cardiac rhythm maintenance device as detailed. Slightly increased pulmonary vascular congestion with mild interstitial edema. Electronically Signed   By: Jacqulynn Cadet M.D.   On: 12/21/2018 08:17    Cardiac Studies   Echo 07/16/18- Study Conclusions  - Left ventricle: The cavity size was normal. There was moderate   concentric hypertrophy. Systolic function was mildly to  moderately reduced. The estimated ejection fraction was in the   range of 40% to 45%. Mild diffuse hypokinesis with regional   variations. There was an increased relative contribution of   atrial contraction to ventricular filling. Doppler parameters are   consistent with high ventricular filling pressure. - Ventricular septum: Septal motion showed moderate paradox. These   changes are consistent with right ventricular pacing. - Aortic valve: Trileaflet; normal thickness, mildly calcified   leaflets. - Mitral valve: Calcified annulus. - Left atrium: The atrium was mildly  dilated. - Tricuspid valve: There was mild regurgitation. - Pulmonary arteries: PA peak pressure: 40 mm Hg (S).  Impressions:  - The right ventricular systolic pressure was increased consistent   with mild pulmonary hypertension.  Patient Profile     83 y.o. female with chronic systolic heart failure, high grade heart block, s/p PPM insertion, LBBB, and HTN. Admitted for ERI- planned upgrade.   Assessment & Plan    1. H/O Stokes Adams syncope - s/p PPM insertion.  2. PPM - Her medtronic device is approaching ERI  upgraded this admission to BiV PPM.   3. Chronic systolic heart failure - she is class 3. Her EF is 40% by echo.   4. PAF - she is maintaining NSR very nicely  5. Chronic anticoagulation- Xarelto  6. LBBB  7. HTN- B/P borderline low   Plan: Per Dr Lovena Le  For questions or updates, please contact [facility] Please consult [email or web address] for contact info under        Signed, Kerin Ransom, PA-C  12/21/2018, 9:29 AM    EP Attending  Patient seen and examined. Agree with above. The patient is s/p biv PPM upgrade due to chronic systolic heart failure and pacing induced LBBB. Her new LV lead has retracted some but is still in good position and working normally. She will by discharged home with usual followup.  Mikle Bosworth.D.

## 2018-12-23 ENCOUNTER — Encounter (HOSPITAL_COMMUNITY): Payer: Self-pay | Admitting: Internal Medicine

## 2018-12-23 MED FILL — Lidocaine HCl Local Inj 1%: INTRAMUSCULAR | Qty: 60 | Status: AC

## 2018-12-25 ENCOUNTER — Telehealth: Payer: Self-pay | Admitting: *Deleted

## 2018-12-25 NOTE — Telephone Encounter (Signed)
Returned Andrea's call from [facility] for when the patient's next pap smear is to be done. Per Melissa APP and Dr. Denman George repeat pap in one year

## 2018-12-30 ENCOUNTER — Ambulatory Visit: Payer: Medicare Other

## 2018-12-31 ENCOUNTER — Telehealth: Payer: Self-pay | Admitting: Neurology

## 2018-12-31 MED ORDER — LIDOCAINE 5 % EX OINT: TOPICAL_OINTMENT | CUTANEOUS | 3 refills | Status: DC

## 2018-12-31 NOTE — Telephone Encounter (Signed)
Yes, that would be fine. Rx was sent to pharmacy for lidocaine.

## 2018-12-31 NOTE — Telephone Encounter (Signed)
Patient came by the office to leave a message for you to please call. She said that her Right Hip pain is worse. She has tried the Asper cream but that did not help. She said that Dr. Posey Pronto had mentioned some medications. Please Call. Thanks

## 2018-12-31 NOTE — Telephone Encounter (Signed)
Patient has a question about the Lidocaine medication. Please call her back at [phone]. Thanks!

## 2018-12-31 NOTE — Telephone Encounter (Signed)
Patient notified that the lidocaine has been sent in.

## 2018-12-31 NOTE — Telephone Encounter (Signed)
Your last note mentions 5 % lidocaine.  Ok to order?

## 2019-01-01 ENCOUNTER — Ambulatory Visit (INDEPENDENT_AMBULATORY_CARE_PROVIDER_SITE_OTHER): Payer: Medicare Other | Admitting: Nurse Practitioner

## 2019-01-01 DIAGNOSIS — I5042 Chronic combined systolic (congestive) and diastolic (congestive) heart failure: Secondary | ICD-10-CM

## 2019-01-01 DIAGNOSIS — I442 Atrioventricular block, complete: Secondary | ICD-10-CM

## 2019-01-01 LAB — CUP PACEART INCLINIC DEVICE CHECK
Date Time Interrogation Session: 20200115100922
Implantable Lead Implant Date: 20090818
Implantable Lead Implant Date: 20090818
Implantable Lead Implant Date: 20200103
Implantable Lead Location: 753858
Implantable Lead Location: 753859
Implantable Lead Location: 753860
Implantable Lead Model: 4598
Implantable Lead Model: 5076
Implantable Lead Model: 5076
Implantable Pulse Generator Implant Date: 20200103

## 2019-01-01 NOTE — Progress Notes (Signed)

## 2019-01-01 NOTE — Patient Instructions (Signed)
Medication Instructions:  none If you need a refill on your cardiac medications before your next appointment, please call your pharmacy.   Lab work: none If you have labs (blood work) drawn today and your tests are completely normal, you will receive your results only by: [name] Kitchen MyChart Message (if you have MyChart) OR . A paper copy in the mail If you have any lab test that is abnormal or we need to change your treatment, we will call you to review the results.  Testing/Procedures: none  Follow-Up: PLEASE SCHEDULE APPT WITH Dr Lovena Le (33 DAYS FROM 12/20/2018) M-T-W At [facility], you and your health needs are our priority.  As part of our continuing mission to provide you with exceptional heart care, we have created designated Provider Care Teams.  These Care Teams include your primary Cardiologist (physician) and Advanced Practice Providers (APPs -  Physician Assistants and Nurse Practitioners) who all work together to provide you with the care you need, when you need it. .   Any Other Special Instructions Will Be Listed Below (If Applicable).

## 2019-01-08 ENCOUNTER — Other Ambulatory Visit: Payer: Self-pay | Admitting: *Deleted

## 2019-01-08 ENCOUNTER — Telehealth: Payer: Self-pay | Admitting: Neurology

## 2019-01-08 MED ORDER — GABAPENTIN 100 MG PO CAPS: 100.0000 mg | ORAL_CAPSULE | Freq: Every day | ORAL | 5 refills | Status: DC

## 2019-01-08 NOTE — Telephone Encounter (Signed)
We can try gabapentin 100mg  at bedtime.  Side effects include sedation, lightheadedness, so we start low and can increase this if she is tolerating it.  Please have her schedule a follow-up to discuss options.

## 2019-01-08 NOTE — Telephone Encounter (Signed)
Please advise 

## 2019-01-08 NOTE — Telephone Encounter (Signed)
Patient states that the  Lidocaine  Is not working for her hip and she wants to speak to some one about what to do to get rid of the pain she is at lunch  from   12:15 TO 2:00.

## 2019-01-08 NOTE — Telephone Encounter (Signed)
Called and left message for patient that we can try the gabapentin and requested for her to call here to schedule a follow up appointment. Sent in Rx.

## 2019-01-11 LAB — CUP PACEART REMOTE DEVICE CHECK
Battery Voltage: 2.62 V
Date Time Interrogation Session: 20191212124008
Implantable Pulse Generator Implant Date: 20090818
Lead Channel Impedance Value: 452 Ohm
Lead Channel Impedance Value: 67 Ohm
Lead Channel Setting Pacing Amplitude: 2.5 V
Lead Channel Setting Pacing Pulse Width: 0.4 ms
Lead Channel Setting Sensing Sensitivity: 2.8 mV
MDC IDC MSMT BATTERY IMPEDANCE: 8203 Ohm
MDC IDC STAT BRADY RV PERCENT PACED: 52 %

## 2019-01-27 ENCOUNTER — Ambulatory Visit (INDEPENDENT_AMBULATORY_CARE_PROVIDER_SITE_OTHER): Payer: Medicare Other | Admitting: Neurology

## 2019-01-27 ENCOUNTER — Encounter: Payer: Self-pay | Admitting: Neurology

## 2019-01-27 VITALS — BP 130/90 | HR 98 | Ht 62.5 in | Wt 203.5 lb

## 2019-01-27 DIAGNOSIS — G5711 Meralgia paresthetica, right lower limb: Secondary | ICD-10-CM | POA: Diagnosis not present

## 2019-01-27 MED ORDER — LIDOCAINE 5 % EX OINT: TOPICAL_OINTMENT | CUTANEOUS | 5 refills | Status: AC

## 2019-01-27 MED ORDER — GABAPENTIN 100 MG PO CAPS: ORAL_CAPSULE | ORAL | 5 refills | Status: DC

## 2019-01-27 NOTE — Progress Notes (Signed)
Follow-up Visit   Date: 01/27/19    [name] [name] MRN: [id number] DOB: 1936/02/15   Interim History: [name] [name] is a 83 y.o. right-handed Caucasian female with hypertension, hyperlipidemia, intermittent complete heart block s/p PPM, GERD, and history of left cerebellar ICH (2006) returning to the clinic for follow-up of right meralgia paresthetica.  The patient was accompanied to the clinic by self.  History of present illness: Starting around April 2019, she began noticing burning pain over the side of the right thigh, which does not involve her medial thigh or past her knee.  Symptoms are intermittent, and worse at bedtime when she is laying to sleep.  Pain is lasts about 30 seconds and can recur.  She has some reduced sensation over the same region.  She has chronic low back pain or leg weakness.  No similar pain in the left thigh.  She has gained 30lb over the past 1.5 year, since she moved into assisted living facility.   UPDATE 01/27/2019:  She is here for follow-up.   Unfortunately, she did not get lasting benefit with lidocaine 5% ointment, so was offered gabapentin 100mg  at bedtime.  She has no relief with gabapentin, however denies any side effects.  She is on a very low dose of this.  When severe, Aspercreme and lidocaine ointment does provide relief for a few hours.  She has no new leg weakness or arm weakness.  Her burning pain continues to involve the lateral side of the right thigh, however she feels it does extend into the thigh area into the thigh and does not move past her knee.  Medications:  Current Outpatient Medications on File Prior to Visit  Medication Sig Dispense Refill  . acetaminophen (TYLENOL) 325 MG tablet Take 1-2 tablets (325-650 mg total) by mouth every 4 (four) hours as needed for mild pain.    . Azelastine HCl 0.15 % SOLN USE 1 TO 2 SPRAYS IN EACH  NOSTRIL DAILY (Patient taking differently: Place 1-2 sprays into both nostrils daily as needed  (congestion). ) 30 mL 5  . budesonide-formoterol (SYMBICORT) 160-4.5 MCG/ACT inhaler Inhale 2 puffs into the lungs 2 (two) times daily.    . Calcium Carbonate-Vit D-Min (CALCIUM 1200) 1200-1000 MG-UNIT CHEW Chew 1 tablet by mouth daily.    . cetirizine (ZYRTEC) 10 MG tablet Take 10 mg by mouth every morning.     . clorazepate (TRANXENE) 7.5 MG tablet Take 1 tablet (7.5 mg total) by mouth 3 (three) times daily. 90 tablet 5  . furosemide (LASIX) 40 MG tablet Take 1 tablet (40 mg total) by mouth 2 (two) times daily. 180 tablet 3  . losartan (COZAAR) 25 MG tablet Take 25 mg by mouth daily.     . magic mouthwash SOLN Swish and swallow 5 ml 4 times daily if needed (Patient taking differently: Take 5 mLs by mouth 4 (four) times daily as needed for mouth pain (sore throat). ) 200 mL 2  . metoprolol succinate (TOPROL-XL) 25 MG 24 hr tablet Take 1 tablet by mouth  daily before breakfast (Patient taking differently: Take 25 mg by mouth daily. ) 90 tablet 3  . montelukast (SINGULAIR) 10 MG tablet Take 10 mg by mouth daily.     . Multiple Vitamins-Minerals (PROTEGRA) CAPS Take 1 capsule by mouth daily.    [name] Kitchen oxyCODONE (OXY IR/ROXICODONE) 5 MG immediate release tablet Take 1 tablet (5 mg total) by mouth every 4 (four) hours as needed for severe pain. 20 tablet  0  . pantoprazole (PROTONIX) 40 MG tablet Take 1 tablet by mouth  twice a day 180 tablet 3  . Polyethyl Glycol-Propyl Glycol (SYSTANE) 0.4-0.3 % SOLN Place 2 drops into both eyes 4 (four) times daily as needed (dryness).     . potassium chloride (K-DUR,KLOR-CON) 10 MEQ tablet TAKE 2 TABLETS BY MOUTH  DAILY (Patient taking differently: Take 10 mEq by mouth 2 (two) times daily. ) 180 tablet 3  . pravastatin (PRAVACHOL) 20 MG tablet Take 20 mg by mouth at bedtime.     . rivaroxaban (XARELTO) 20 MG TABS tablet Take 1 tablet (20 mg total) by mouth daily. 90 tablet 6  . senna (SENOKOT) 8.6 MG TABS tablet Take 1 tablet (8.6 mg total) by mouth at bedtime. (Patient  taking differently: Take 2 tablets by mouth at bedtime. ) 120 each 0   No current facility-administered medications on file prior to visit.     Allergies:  Allergies  Allergen Reactions  . Cephalexin Diarrhea  . Imitrex [Sumatriptan] Other (See Comments)    Reaction unknown  . Sulfa Antibiotics Nausea Only    Doesn't like to drink lots of water.    Review of Systems:  CONSTITUTIONAL: No fevers, chills, night sweats, or weight loss.  EYES: No visual changes or eye pain ENT: No hearing changes.  No history of nose bleeds.   RESPIRATORY: No cough, wheezing and shortness of breath.   CARDIOVASCULAR: Negative for chest pain, and palpitations.   GI: Negative for abdominal discomfort, blood in stools or black stools.  No recent change in bowel habits.   GU:  No history of incontinence.   MUSCLOSKELETAL: No history of joint pain or swelling.  No myalgias.   SKIN: Negative for lesions, rash, and itching.   ENDOCRINE: Negative for cold or heat intolerance, polydipsia or goiter.   PSYCH:  No depression or anxiety symptoms.   NEURO: As Above.   Vital Signs:  BP 130/90   Pulse 98   Ht 5' 2.5" (1.588 m)   Wt 203 lb 8 oz (92.3 kg)   SpO2 93%   BMI 36.63 kg/m   General Medical Exam:   General:  Well appearing, comfortable  Eyes/ENT: see cranial nerve examination.   Neck:  No carotid bruits. Respiratory:  Clear to auscultation, good air entry bilaterally.   Cardiac:  Regular rate and rhythm, no murmur.   Ext:  Bilateral leg edema  Neurological Exam: MENTAL STATUS including orientation to time, place, person, recent and remote memory, attention span and concentration, language, and fund of knowledge is normal.  Speech is not dysarthric.  CRANIAL NERVES:  Face is symmetric.   MOTOR:  Motor strength is 5/5 in all extremities  MSRs:  Reflexes are 2+/4 throughout, except 1+/4 at the ankles bilaterally.  COORDINATION/GAIT:  Gait is wide-based, assisted with walker  Data: Lab  Results  Component Value Date   TSH 0.94 05/04/2015   Lab Results  Component Value Date   HGBA1C 5.6 05/04/2015     IMPRESSION/PLAN: Right meralgia paresthetica with painful neuropathic pain over the lateral thigh.   - Start gabapentin 100mg  - Take 2 tablet at bedtime x 2 weeks, then increase 3 tablet at bedtime.  Side effects discussed. Titrate further as tolerable  - Continue to use lidocaine ointment   - She has lost 10lb since her last visit, encouraged her to continue weight loss with diet and exercise   Return to clinic in 6 months   Thank you for allowing  me to participate in patient's care.  If I can answer any additional questions, I would be pleased to do so.    Sincerely,    Ameri Cahoon K. Posey Pronto, DO

## 2019-01-27 NOTE — Patient Instructions (Addendum)
Start gabapentin 100mg  - Take 2 tablet at bedtime x 2 weeks, then increase 3 tablet at bedtime.  Continue to use lidocaine ointment   Return to clinic 6 months

## 2019-02-14 ENCOUNTER — Telehealth: Payer: Self-pay | Admitting: Internal Medicine

## 2019-02-14 NOTE — Telephone Encounter (Signed)
Left message notifying ok to start exercising with no limitations.

## 2019-02-14 NOTE — Telephone Encounter (Signed)
Patient recently had a pacemaker placed 8 weeks ago.  She would like to know if it is okay for her to start exercising again.  If she is not home, it is ok to leave her a detailed message on voicemail.

## 2019-02-18 ENCOUNTER — Other Ambulatory Visit: Payer: Self-pay | Admitting: Neurology

## 2019-02-26 ENCOUNTER — Telehealth: Payer: Self-pay | Admitting: Neurology

## 2019-02-26 NOTE — Telephone Encounter (Signed)
Patient wants to talk to someone about the gabapentin. Please call patient around 12:00

## 2019-02-26 NOTE — Telephone Encounter (Signed)
Patient said that she is up to 300 mg qhs but it is not helping.  Please advise.  If we increase I will send in another Rx to [facility] per patient's request.

## 2019-02-27 NOTE — Telephone Encounter (Signed)
Patient called and left message that she picked up Rx at local pharmacy and she got 270 pills (3 month supply).  Called her back and left her a message giving her new instructions and requested for her to call me back and leave me a message if she would like to increase medication and if she would like a new Rx sent to [facility].

## 2019-02-27 NOTE — Telephone Encounter (Signed)
Please ask if she is tolerating the medication and it is not making her too sleepy.  If she is doing well, then increase gabapentin to 100mg  in the morning, continue 300mg  at bedtime x 1 week, then increase to 200mg  in the morning and 300mg  at bedtime.

## 2019-03-14 ENCOUNTER — Telehealth: Payer: Self-pay | Admitting: *Deleted

## 2019-03-14 NOTE — Telephone Encounter (Signed)
Called and left the patient a message to call the office back. Need to move the appt from 4/17 to June due to COVID-19

## 2019-03-17 NOTE — Telephone Encounter (Signed)
Patient called back and her appt was moved from 4/17 to 6/3 due to COVID-19

## 2019-03-19 ENCOUNTER — Telehealth: Payer: Self-pay | Admitting: Neurology

## 2019-03-19 NOTE — Telephone Encounter (Signed)
Patient would like to go back to taking gabapentin bid instead of tid since it is making her sleepy.  Instructed her to go back to 200 mg bid after discussing with Dr. Posey Pronto.  Patient stated that she is doing much better and will call if she has any problems.

## 2019-03-19 NOTE — Telephone Encounter (Signed)
Wants to talk to [place] about medication, she wants to know if she can lower the dosage

## 2019-03-21 ENCOUNTER — Telehealth: Payer: Self-pay | Admitting: Internal Medicine

## 2019-03-21 NOTE — Telephone Encounter (Signed)
Follow up:   Patient returning call back concerning appt. please call patient. Patient back in her room at 12:45 today.

## 2019-03-21 NOTE — Telephone Encounter (Signed)
Follow up   Patient called back. Pt was ok with having a virtual visit. Started walking patient through how to download webex but patient did not know the password for her Ipad in order to complete. Patient will be reaching out to her daughter for the password and asked to be called back on Monday at [place].

## 2019-03-21 NOTE — Telephone Encounter (Signed)
New message   [facility] for pt to call back regarding visit with Dr. Lovena Le on 04.08.20.

## 2019-03-24 NOTE — Telephone Encounter (Signed)
Follow up   Called patient to do walk through of webex. Pt asked to be called back this afternoon because she has to get a new cord for her ipad today. Will call patient back at 2pm.

## 2019-03-25 ENCOUNTER — Other Ambulatory Visit: Payer: Self-pay

## 2019-03-25 ENCOUNTER — Ambulatory Visit (INDEPENDENT_AMBULATORY_CARE_PROVIDER_SITE_OTHER): Payer: Medicare Other | Admitting: *Deleted

## 2019-03-25 DIAGNOSIS — I442 Atrioventricular block, complete: Secondary | ICD-10-CM | POA: Diagnosis not present

## 2019-03-25 LAB — CUP PACEART REMOTE DEVICE CHECK
Battery Remaining Longevity: 111 mo
Battery Voltage: 3.12 V
Brady Statistic AP VP Percent: 1.21 %
Brady Statistic AP VS Percent: 0.01 %
Brady Statistic AS VP Percent: 96.05 %
Brady Statistic AS VS Percent: 2.73 %
Brady Statistic RA Percent Paced: 1.58 %
Brady Statistic RV Percent Paced: 93.99 %
Date Time Interrogation Session: 20200407032643
Implantable Lead Implant Date: 20090818
Implantable Lead Implant Date: 20090818
Implantable Lead Implant Date: 20200103
Implantable Lead Location: 753858
Implantable Lead Location: 753859
Implantable Lead Location: 753860
Implantable Lead Model: 4598
Implantable Lead Model: 5076
Implantable Lead Model: 5076
Implantable Pulse Generator Implant Date: 20200103
Lead Channel Impedance Value: 342 Ohm
Lead Channel Impedance Value: 342 Ohm
Lead Channel Impedance Value: 361 Ohm
Lead Channel Impedance Value: 380 Ohm
Lead Channel Impedance Value: 418 Ohm
Lead Channel Impedance Value: 418 Ohm
Lead Channel Impedance Value: 437 Ohm
Lead Channel Impedance Value: 437 Ohm
Lead Channel Impedance Value: 494 Ohm
Lead Channel Impedance Value: 570 Ohm
Lead Channel Impedance Value: 627 Ohm
Lead Channel Impedance Value: 627 Ohm
Lead Channel Impedance Value: 627 Ohm
Lead Channel Impedance Value: 684 Ohm
Lead Channel Pacing Threshold Amplitude: 0.5 V
Lead Channel Pacing Threshold Amplitude: 1.125 V
Lead Channel Pacing Threshold Amplitude: 1.125 V
Lead Channel Pacing Threshold Pulse Width: 0.4 ms
Lead Channel Pacing Threshold Pulse Width: 0.4 ms
Lead Channel Pacing Threshold Pulse Width: 0.4 ms
Lead Channel Sensing Intrinsic Amplitude: 1.625 mV
Lead Channel Sensing Intrinsic Amplitude: 1.625 mV
Lead Channel Sensing Intrinsic Amplitude: 6.625 mV
Lead Channel Sensing Intrinsic Amplitude: 6.625 mV
Lead Channel Setting Pacing Amplitude: 1.5 V
Lead Channel Setting Pacing Amplitude: 1.75 V
Lead Channel Setting Pacing Amplitude: 3 V
Lead Channel Setting Pacing Pulse Width: 0.4 ms
Lead Channel Setting Pacing Pulse Width: 0.4 ms
Lead Channel Setting Sensing Sensitivity: 1.2 mV

## 2019-03-25 NOTE — Telephone Encounter (Signed)
Complete

## 2019-03-26 ENCOUNTER — Telehealth (INDEPENDENT_AMBULATORY_CARE_PROVIDER_SITE_OTHER): Payer: Medicare Other | Admitting: Internal Medicine

## 2019-03-26 ENCOUNTER — Other Ambulatory Visit: Payer: Self-pay

## 2019-03-26 DIAGNOSIS — I442 Atrioventricular block, complete: Secondary | ICD-10-CM | POA: Diagnosis not present

## 2019-03-26 DIAGNOSIS — I5042 Chronic combined systolic (congestive) and diastolic (congestive) heart failure: Secondary | ICD-10-CM | POA: Diagnosis not present

## 2019-03-26 DIAGNOSIS — Z950 Presence of cardiac pacemaker: Secondary | ICD-10-CM | POA: Diagnosis not present

## 2019-03-26 NOTE — Progress Notes (Signed)
Electrophysiology TeleHealth Note   Due to national recommendations of social distancing due to COVID 19, an audio/video telehealth visit is felt to be most appropriate for this patient at this time.  See MyChart message from today for the patient's consent to telehealth for [name]   Date:  03/26/2019   ID:  [name] [name], DOB 1936-01-27, MRN [id number]  Location: patient's home  Provider location: [address] [place]  Evaluation Performed: Follow-up visit  PCP:  Maurice Small, MD  Cardiologist:  No primary care provider on file.  Electrophysiologist:  Dr Lovena Le  Chief Complaint:  " I am tired  History of Present Illness:    [name] [name] is a 83 y.o. female who presents via audio/video conferencing for a telehealth visit today.  Since last being seen in our clinic, the patient reports doing very well except she is tired of being quarantined.  Today, she denies symptoms of palpitations, chest pain, shortness of breath,  lower extremity edema, dizziness, presyncope, or syncope.  The patient is otherwise without complaint today.  The patient denies symptoms of fevers, chills, cough, or new SOB worrisome for COVID 19.  Past Medical History:  Diagnosis Date  . Allergic rhinitis   . Anxiety   . Aortic atherosclerosis ([place])   . Cardiac pacemaker in situ    DOI  08-04-2008  medtronic dual chamber  . Chronic anticoagulation   . Chronic combined systolic and diastolic CHF, NYHA class 2 ([place])    montior by cardiologist-- dr Kentley Cedillo--  ef 45%  . Coronary artery disease CARDIOLOGIST-- DR Cristopher Peru   per cath-- Non-obstructive minimal CAD  . Fluid retention   . GERD (gastroesophageal reflux disease)   . History of cardiomegaly    Mild  . History of cervical dysplasia    CIN III  . History of CVA (cerebrovascular accident)    09-15-2005  Left Cerebellar Hematoma with mild stem compression whild on coumadin for DVT  . History of DVT of lower extremity    . History of hemorrhoids   . History of hiatal hernia    stable, small  . History of non-ST elevation myocardial infarction (NSTEMI)    2011-- in setting of bilateral PE's and DVT's and CHF  . History of peptic ulcer disease   . History of pulmonary edema 07/25/2010  . History of pulmonary embolus (PE)   . Hyperlipidemia   . Hypertension   . Incontinence of urine   . LBBB (left bundle branch block)   . LVH (left ventricular hypertrophy) 07/16/2018   Moderate, noted on ECHO  . Mild obstructive sleep apnea    PT USES Humidied Nasal Mask with O2 at2.5L (study 08-05-2009)  . Mild pulmonary hypertension ([place])   . Moderate intermittent asthma   . OA (osteoarthritis)   . PAF (paroxysmal atrial fibrillation) ([place])   . Peripheral edema   . Renal insufficiency   . Sarcoidosis   . Shortness of breath    OCCASIONAL  . Sigmoid diverticulosis    Mild  . Stokes-Adams syncope    asymptomatic  . Thrombophilia ([place])    hemotologist--  dr Marin Olp--  monitors pt anitcoagulation  . Urgency of urination   . Venous insufficiency   . VIN III (vulvar intraepithelial neoplasia III)   . Wears glasses    reading    Past Surgical History:  Procedure Laterality Date  . ABDOMINAL HYSTERECTOMY  1970's  . APPENDECTOMY  1966  . BIV UPGRADE  12/20/2018  . BIV UPGRADE N/A 12/20/2018   Procedure: BIV PPM UPGRADE;  Surgeon: Evans Lance, MD;  Location: [facility];  Service: Cardiovascular;  Laterality: N/A;  . CARDIAC CATHETERIZATION  08-26-2003   dr Lorenz Coaster. non-obstructive CAD & LVD with ef 46%, global HK, 2+MR, PA press 50/24 w/ wedge 20  . CARDIAC CATHETERIZATION  08-03-2008   dr Angelena Form   Non-obstructive CAD/  30-40% pLAD,  30% ostial CFX/  symptomatic bradycardia/  normal renal arteries  . CARDIAC CATHETERIZATION N/A 07/21/2016   Procedure: Right Heart Cath;  Surgeon: Larey Dresser, MD;  Location: [facility];  Service: Cardiovascular;  Laterality: N/A;  . CARDIAC PACEMAKER  PLACEMENT  08-04-2008  dr Lovena Le [place]   for CHB--  MEDTRONIC DUAL CHAMBER  . CATARACTS REMOVED    . CHOLECYSTECTOMY  01/1996  . DECCOMPRESSION ULNAR NERVE AT CUBITAL TUNNEL AND RESECTION MUSCLE Left 03-17-2008  . EXCISION LEFT SUPRACLAVICULAR LYMPH NODE   04-22-2007   granulomatous inflammation, extensive  . INSERTION OF VENA CAVA FILTER  08/ 2011  . KNEE ARTHROSCOPY     LEFT  . LUMBAR [employer] SURGERY  x2   1990  . SHOULDER ARTHROSCOPY WITH ROTATOR CUFF REPAIR AND SUBACROMIAL DECOMPRESSION Right 02-08-2009  . TOTAL KNEE ARTHROPLASTY Left 04/21/2013   Procedure: LEFT TOTAL KNEE ARTHROPLASTY;  Surgeon: Gearlean Alf, MD;  Location: [facility];  Service: Orthopedics;  Laterality: Left;  . TOTAL KNEE ARTHROPLASTY Right 04/03/2016   Procedure: RIGHT TOTAL KNEE ARTHROPLASTY;  Surgeon: Gaynelle Arabian, MD;  Location: [facility];  Service: Orthopedics;  Laterality: Right;  . TRANSTHORACIC ECHOCARDIOGRAM  last one 05-19-2014   mild LVH/  ef 45-50%/  septal-lateral dyssynchroncy consistant with LBBB/  diffuse hypokinesis/  grade I diastolic dysfunction/  trivial MR and TR/  mild LAE  . TUBAL LIGATION    . VULVAR LESION REMOVAL  07/16/2012   Procedure: VULVAR LESION;  Surgeon: Imagene Gurney A. Alycia Rossetti, MD;  Location: [facility];  Service: Gynecology;  Laterality: Right;  Wide Local Excision of the Vulvar  . VULVECTOMY N/A 02/19/2015   Procedure: SIMPLE PARTIAL VULVECTOMY;  Surgeon: [name], MD;  Location: [facility];  Service: Gynecology;  Laterality: N/A;  . VULVECTOMY PARTIAL N/A 10/01/2018   Procedure: VULVECTOMY PARTIAL;  Surgeon: [name], MD;  Location: [facility];  Service: Gynecology;  Laterality: N/A;    Current Outpatient Medications  Medication Sig Dispense Refill  . acetaminophen (TYLENOL) 325 MG tablet Take 1-2 tablets (325-650 mg total) by mouth every 4 (four) hours as needed for mild pain.    . Azelastine HCl 0.15 % SOLN USE 1 TO 2 SPRAYS IN EACH  NOSTRIL DAILY (Patient  taking differently: Place 1-2 sprays into both nostrils daily as needed (congestion). ) 30 mL 5  . budesonide-formoterol (SYMBICORT) 160-4.5 MCG/ACT inhaler Inhale 2 puffs into the lungs 2 (two) times daily.    . Calcium Carbonate-Vit D-Min (CALCIUM 1200) 1200-1000 MG-UNIT CHEW Chew 1 tablet by mouth daily.    . cetirizine (ZYRTEC) 10 MG tablet Take 10 mg by mouth every morning.     . clorazepate (TRANXENE) 7.5 MG tablet Take 1 tablet (7.5 mg total) by mouth 3 (three) times daily. 90 tablet 5  . furosemide (LASIX) 40 MG tablet Take 1 tablet (40 mg total) by mouth 2 (two) times daily. 180 tablet 3  . gabapentin (NEURONTIN) 100 MG capsule TAKE 2 CAPSULES AT BEDTIME X 2 WEEKS, THEN INCREASE 3  CAPSULES AT BEDTIME. 270 capsule 2  . lidocaine (XYLOCAINE) 5 % ointment Apply to right thigh twice daily as needed for pain. 50 g 5  . losartan (COZAAR) 25 MG tablet Take 25 mg by mouth daily.     . magic mouthwash SOLN Swish and swallow 5 ml 4 times daily if needed (Patient taking differently: Take 5 mLs by mouth 4 (four) times daily as needed for mouth pain (sore throat). ) 200 mL 2  . metoprolol succinate (TOPROL-XL) 25 MG 24 hr tablet Take 1 tablet by mouth  daily before breakfast (Patient taking differently: Take 25 mg by mouth daily. ) 90 tablet 3  . montelukast (SINGULAIR) 10 MG tablet Take 10 mg by mouth daily.     . Multiple Vitamins-Minerals (PROTEGRA) CAPS Take 1 capsule by mouth daily.    [name] Kitchen oxyCODONE (OXY IR/ROXICODONE) 5 MG immediate release tablet Take 1 tablet (5 mg total) by mouth every 4 (four) hours as needed for severe pain. 20 tablet 0  . pantoprazole (PROTONIX) 40 MG tablet Take 1 tablet by mouth  twice a day 180 tablet 3  . Polyethyl Glycol-Propyl Glycol (SYSTANE) 0.4-0.3 % SOLN Place 2 drops into both eyes 4 (four) times daily as needed (dryness).     . potassium chloride (K-DUR,KLOR-CON) 10 MEQ tablet TAKE 2 TABLETS BY MOUTH  DAILY (Patient taking differently: Take 10 mEq by mouth 2 (two)  times daily. ) 180 tablet 3  . pravastatin (PRAVACHOL) 20 MG tablet Take 20 mg by mouth at bedtime.     . rivaroxaban (XARELTO) 20 MG TABS tablet Take 1 tablet (20 mg total) by mouth daily. 90 tablet 6  . senna (SENOKOT) 8.6 MG TABS tablet Take 1 tablet (8.6 mg total) by mouth at bedtime. (Patient taking differently: Take 2 tablets by mouth at bedtime. ) 120 each 0   No current facility-administered medications for this visit.     Allergies:   Cephalexin; Imitrex [sumatriptan]; and Sulfa antibiotics   Social History:  The patient  reports that she has never smoked. She has never used smokeless tobacco. She reports that she does not drink alcohol or use drugs.   Family History:  The patient's  family history includes Breast cancer in her sister; Heart attack in her brother and father; Kidney failure in her sister.   ROS:  Please see the history of present illness.   All other systems are personally reviewed and negative.    Exam:    Vital Signs:  bp- 139/90, p - 84, R -16, wt - 212 Well appearing, alert and conversant, regular work of breathing,  good skin color Eyes- anicteric, neuro- grossly intact, skin- no apparent rash or lesions or cyanosis, mouth- oral mucosa is pink   Labs/Other Tests and Data Reviewed:    Recent Labs: 07/12/2018: NT-Pro BNP 671 12/03/2018: BUN 20; Creatinine, Ser 1.10; Hemoglobin 12.4; Platelets 176; Potassium 4.2; Sodium 143   Wt Readings from Last 3 Encounters:  01/27/19 203 lb 8 oz (92.3 kg)  12/21/18 209 lb 7 oz (95 kg)  10/23/18 214 lb 4.8 oz (97.2 kg)     Other studies personally reviewed:  Last device remote is reviewed from [place] PDF dated 03/25/19 which reveals normal device function, no arrhythmias    ASSESSMENT & PLAN:    1.  CHB - she is s/p biv upgrade 2. Chronic systolic heart failure - her symptoms are class 2.  3. Stroke - she has had no additional symptoms. We will follow.  4. COVID 19 screen The patient denies symptoms of COVID  19 at this time.  The importance of social distancing was discussed today.  Follow-up:  8/20 Next remote: 7/20  Current medicines are reviewed at length with the patient today.   The patient does not have concerns regarding her medicines.  The following changes were made today:  none  Labs/ tests ordered today include:  No orders of the defined types were placed in this encounter.    Patient Risk:  after full review of this patients clinical status, I feel that they are at moderate risk at this time.  Today, I have spent 20 minutes with the patient with telehealth technology discussing as above .    Signed, Cristopher Peru, MD  03/26/2019 3:51 PM     [facility] [address] [place] [address] [phone] (office) [phone] (fax)

## 2019-03-27 ENCOUNTER — Telehealth: Payer: Self-pay | Admitting: Neurology

## 2019-03-27 NOTE — Telephone Encounter (Signed)
Patient calling in about wanting to change medication. Please call her back. Thanks!

## 2019-03-28 ENCOUNTER — Other Ambulatory Visit: Payer: Self-pay | Admitting: Internal Medicine

## 2019-03-31 NOTE — Telephone Encounter (Signed)
Patient is calling in again about medication. Please call her. Thanks!

## 2019-04-01 ENCOUNTER — Other Ambulatory Visit: Payer: Self-pay | Admitting: *Deleted

## 2019-04-01 MED ORDER — GABAPENTIN 100 MG PO CAPS: ORAL_CAPSULE | ORAL | 3 refills | Status: DC

## 2019-04-01 NOTE — Telephone Encounter (Signed)
New Rx sent to [facility].

## 2019-04-01 NOTE — Telephone Encounter (Signed)
Patient called and said you needed [facility] fax #: [phone].[name] KitchenMarland Kitchen

## 2019-04-01 NOTE — Telephone Encounter (Signed)
I spoke with patient and she just wanted to make sure that she can have generic neurontin/gabapentin.  Informed her this is ok.

## 2019-04-03 ENCOUNTER — Encounter: Payer: Self-pay | Admitting: Cardiology

## 2019-04-03 NOTE — Progress Notes (Signed)
Remote pacemaker transmission.   

## 2019-04-04 ENCOUNTER — Ambulatory Visit: Payer: Medicare Other | Admitting: Gynecologic Oncology

## 2019-04-21 ENCOUNTER — Telehealth: Payer: Self-pay | Admitting: Internal Medicine

## 2019-04-21 NOTE — Telephone Encounter (Signed)
New message:    Patient calling concering a test she is to have and no one has called her back. Please call patient.

## 2019-04-21 NOTE — Telephone Encounter (Signed)
Pt was concerned because her daughter saw something in [place] regarding pt needed bone density scan. Advised pt that after reviewing notes from Dr. Forde Dandy virtual visit with pt in April, I did not see anything regarding that on our end. Advised pt to contact PCP. No new orders were placed after most recent visit with Dr. Lovena Le. Pt agreeable and understands.

## 2019-05-19 ENCOUNTER — Other Ambulatory Visit: Payer: Self-pay | Admitting: Neurology

## 2019-05-21 ENCOUNTER — Encounter: Payer: Self-pay | Admitting: Gynecologic Oncology

## 2019-05-21 ENCOUNTER — Inpatient Hospital Stay: Payer: Medicare Other | Attending: Gynecologic Oncology | Admitting: Gynecologic Oncology

## 2019-05-21 ENCOUNTER — Other Ambulatory Visit: Payer: Self-pay

## 2019-05-21 VITALS — BP 168/89 | HR 93 | Temp 97.8°F | Resp 19 | Ht 62.5 in | Wt 212.0 lb

## 2019-05-21 DIAGNOSIS — D398 Neoplasm of uncertain behavior of other specified female genital organs: Secondary | ICD-10-CM | POA: Insufficient documentation

## 2019-05-21 DIAGNOSIS — Z90710 Acquired absence of both cervix and uterus: Secondary | ICD-10-CM | POA: Diagnosis not present

## 2019-05-21 DIAGNOSIS — Z9079 Acquired absence of other genital organ(s): Secondary | ICD-10-CM | POA: Diagnosis not present

## 2019-05-21 DIAGNOSIS — D071 Carcinoma in situ of vulva: Secondary | ICD-10-CM | POA: Diagnosis present

## 2019-05-21 DIAGNOSIS — N9089 Other specified noninflammatory disorders of vulva and perineum: Secondary | ICD-10-CM

## 2019-05-21 NOTE — H&P (View-Only) (Signed)
Follow-up Note: Gyn-Onc   [name] [name] 83 y.o. female  Chief Complaint  Patient presents with  . 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  S/p biopsy today - will follow this up. If cancer, will order PET and consider radical vulvectomy with inguinofemoral lymphadenectomy (unilateral). If benign, will follow-up in 6 months for surveillance.   HPI: February 19, 2015 the patient underwent a partial simple vulvectomy for VIN 3.  Final pathology showed VIN 3 with a focal positive vaginal margin. She recently saw Dr. Benjie Karvonen who obtained a Pap smear showing ASCUS with high risk HPV in 2019.  In addition a vulvar lesion was seen and biopsied returning VIN 3. Colposcopy with Dr Fermin Schwab in September, 2019 of the vagina did not show high grade lesions.  She underwent wide local excision of the vulva on 10/01/18 for VIN 3. Pathology showed VIN 3 with positive margins at 3 and 9 o'clock focally. No macroscopic residual disease.   Interval History: She was scheduled for follow-up in March, 2020 however this was postponed due to the COVID-10 pandemic. At that time she began experiencing right vulva pain and began to feel a mass.   Review of Systems:10 point review of systems is negative except as noted in interval history.   Vitals: Blood pressure (!) 168/89, pulse 93, temperature 97.8 F (36.6 C), temperature source Oral, resp. rate 19, height 5' 2.5" (1.588 m), weight 212 lb (96.2 kg), SpO2 96 %.  Physical Exam: General : The patient is a healthy woman in no acute distress.  HEENT: normocephalic, extraoccular movements normal; neck is supple without thyromegally  Lynphnodes: Supraclavicular and inguinal nodes not enlarged  Abdomen: Soft, non-tender, no ascites, no organomegally, no masses, no hernias  Pelvic:  EGBUS: At the introitus  (mid way) on the right there is a 2cm exophytic lesion that is friable. It was biopsied. >2cm from midline. Not fixed to underlying structures. Vagina: Normal, no lesions.   Urethra and Bladder: Normal, non-tender  Cervix: Surgically absent  Uterus: Surgically absent  Bi-manual examination: Non-tender; no adenxal masses or nodularity  Rectal: normal sphincter tone, no masses, no blood  Lower extremities: No edema or varicosities. Normal range of motion      Allergies  Allergen Reactions  . Cephalexin Diarrhea  . Imitrex [Sumatriptan] Other (See Comments)    Reaction unknown  . Sulfa Antibiotics Nausea Only    Doesn't like to drink lots of water.    Past Medical History:  Diagnosis Date  . Allergic rhinitis   . Anxiety   . Aortic atherosclerosis ([place])   . Cardiac pacemaker in situ    DOI  08-04-2008  medtronic dual chamber  . Chronic anticoagulation   . Chronic combined systolic and diastolic CHF, NYHA class 2 ([place])    [place] by cardiologist-- dr taylor--  ef 45%  . Coronary artery disease CARDIOLOGIST-- DR Cristopher Peru   per cath-- Non-obstructive minimal CAD  . Fluid retention   . GERD (gastroesophageal reflux disease)   . History of cardiomegaly    Mild  . History of cervical dysplasia    CIN III  . History of CVA (cerebrovascular accident)    09-15-2005  Left Cerebellar Hematoma with mild stem compression whild on coumadin for DVT  . History of DVT of lower extremity   .  History of hemorrhoids   . History of hiatal hernia    stable, small  . History of non-ST elevation myocardial infarction (NSTEMI)    2011-- in setting of bilateral PE's and DVT's and CHF  . History of peptic ulcer disease   . History of pulmonary edema 07/25/2010  . History of pulmonary embolus (PE)   . Hyperlipidemia   . Hypertension   . Incontinence of urine   . LBBB (left bundle branch block)   . LVH (left ventricular hypertrophy) 07/16/2018   Moderate, noted on ECHO  . Mild obstructive  sleep apnea    PT USES Humidied Nasal Mask with O2 at2.5L (study 08-05-2009)  . Mild pulmonary hypertension ([place])   . Moderate intermittent asthma   . OA (osteoarthritis)   . PAF (paroxysmal atrial fibrillation) ([place])   . Peripheral edema   . Renal insufficiency   . Sarcoidosis   . Shortness of breath    OCCASIONAL  . Sigmoid diverticulosis    Mild  . Stokes-Adams syncope    asymptomatic  . Thrombophilia ([place])    hemotologist--  dr Marin Olp--  monitors pt anitcoagulation  . Urgency of urination   . Venous insufficiency   . VIN III (vulvar intraepithelial neoplasia III)   . Wears glasses    reading    Past Surgical History:  Procedure Laterality Date  . ABDOMINAL HYSTERECTOMY  1970's  . APPENDECTOMY  1966  . BIV UPGRADE  12/20/2018  . BIV UPGRADE N/A 12/20/2018   Procedure: BIV PPM UPGRADE;  Surgeon: Evans Lance, MD;  Location: [facility];  Service: Cardiovascular;  Laterality: N/A;  . CARDIAC CATHETERIZATION  08-26-2003   dr Lorenz Coaster. non-obstructive CAD & LVD with ef 46%, global HK, 2+MR, PA press 50/24 w/ wedge 20  . CARDIAC CATHETERIZATION  08-03-2008   dr Angelena Form   Non-obstructive CAD/  30-40% pLAD,  30% ostial CFX/  symptomatic bradycardia/  normal renal arteries  . CARDIAC CATHETERIZATION N/A 07/21/2016   Procedure: Right Heart Cath;  Surgeon: Larey Dresser, MD;  Location: [facility];  Service: Cardiovascular;  Laterality: N/A;  . CARDIAC PACEMAKER PLACEMENT  08-04-2008  dr Lovena Le gregg   for CHB--  MEDTRONIC DUAL CHAMBER  . CATARACTS REMOVED    . CHOLECYSTECTOMY  01/1996  . DECCOMPRESSION ULNAR NERVE AT CUBITAL TUNNEL AND RESECTION MUSCLE Left 03-17-2008  . EXCISION LEFT SUPRACLAVICULAR LYMPH NODE   04-22-2007   granulomatous inflammation, extensive  . INSERTION OF VENA CAVA FILTER  08/ 2011  . KNEE ARTHROSCOPY     LEFT  . LUMBAR [place] SURGERY  x2   1990  . SHOULDER ARTHROSCOPY WITH ROTATOR CUFF REPAIR AND SUBACROMIAL DECOMPRESSION Right  02-08-2009  . TOTAL KNEE ARTHROPLASTY Left 04/21/2013   Procedure: LEFT TOTAL KNEE ARTHROPLASTY;  Surgeon: Gearlean Alf, MD;  Location: [facility];  Service: Orthopedics;  Laterality: Left;  . TOTAL KNEE ARTHROPLASTY Right 04/03/2016   Procedure: RIGHT TOTAL KNEE ARTHROPLASTY;  Surgeon: Gaynelle Arabian, MD;  Location: [facility];  Service: Orthopedics;  Laterality: Right;  . TRANSTHORACIC ECHOCARDIOGRAM  last one 05-19-2014   mild LVH/  ef 45-50%/  septal-lateral dyssynchroncy consistant with LBBB/  diffuse hypokinesis/  grade I diastolic dysfunction/  trivial MR and TR/  mild LAE  . TUBAL LIGATION    . VULVAR LESION REMOVAL  07/16/2012   Procedure: VULVAR LESION;  Surgeon: Imagene Gurney A. Alycia Rossetti, MD;  Location: [facility];  Service: Gynecology;  Laterality: Right;  Wide  Local Excision of the Vulvar  . VULVECTOMY N/A 02/19/2015   Procedure: SIMPLE PARTIAL VULVECTOMY;  Surgeon: [name], MD;  [facility];  Service: Gynecology;  Laterality: N/A;  . VULVECTOMY PARTIAL N/A 10/01/2018   Procedure: VULVECTOMY PARTIAL;  Surgeon: [name], MD;  Location: [facility];  Service: Gynecology;  Laterality: N/A;    Current Outpatient Medications  Medication Sig Dispense Refill  . acetaminophen (TYLENOL) 325 MG tablet Take 1-2 tablets (325-650 mg total) by mouth every 4 (four) hours as needed for mild pain.    . Azelastine HCl 0.15 % SOLN USE 1 TO 2 SPRAYS IN EACH  NOSTRIL DAILY (Patient taking differently: Place 1-2 sprays into both nostrils daily as needed (congestion). ) 30 mL 5  . budesonide-formoterol (SYMBICORT) 160-4.5 MCG/ACT inhaler Inhale 2 puffs into the lungs 2 (two) times daily.    . Calcium Carbonate-Vit D-Min (CALCIUM 1200) 1200-1000 MG-UNIT CHEW Chew 1 tablet by mouth daily.    . cetirizine (ZYRTEC) 10 MG tablet Take 10 mg by mouth every morning.     . clorazepate (TRANXENE) 7.5 MG tablet Take 1 tablet (7.5 mg total) by mouth 3 (three) times daily. 90 tablet 5  .  furosemide (LASIX) 40 MG tablet TAKE 1 TABLET BY MOUTH  TWICE A DAY 180 tablet 3  . gabapentin (NEURONTIN) 100 MG capsule 3 CAPSULES AT BEDTIME. 270 capsule 2  . lidocaine (XYLOCAINE) 5 % ointment Apply to right thigh twice daily as needed for pain. 50 g 5  . losartan (COZAAR) 25 MG tablet Take 25 mg by mouth daily.     . magic mouthwash SOLN Swish and swallow 5 ml 4 times daily if needed (Patient taking differently: Take 5 mLs by mouth 4 (four) times daily as needed for mouth pain (sore throat). ) 200 mL 2  . metoprolol succinate (TOPROL-XL) 25 MG 24 hr tablet Take 1 tablet by mouth  daily before breakfast (Patient taking differently: Take 25 mg by mouth daily. ) 90 tablet 3  . montelukast (SINGULAIR) 10 MG tablet Take 10 mg by mouth daily.     . Multiple Vitamins-Minerals (PROTEGRA) CAPS Take 1 capsule by mouth daily.    [name] Kitchen oxyCODONE (OXY IR/ROXICODONE) 5 MG immediate release tablet Take 1 tablet (5 mg total) by mouth every 4 (four) hours as needed for severe pain. 20 tablet 0  . pantoprazole (PROTONIX) 40 MG tablet Take 1 tablet by mouth  twice a day 180 tablet 3  . Polyethyl Glycol-Propyl Glycol (SYSTANE) 0.4-0.3 % SOLN Place 2 drops into both eyes 4 (four) times daily as needed (dryness).     . potassium chloride (K-DUR,KLOR-CON) 10 MEQ tablet TAKE 2 TABLETS BY MOUTH  DAILY (Patient taking differently: Take 10 mEq by mouth 2 (two) times daily. ) 180 tablet 3  . pravastatin (PRAVACHOL) 20 MG tablet Take 20 mg by mouth at bedtime.     . rivaroxaban (XARELTO) 20 MG TABS tablet Take 1 tablet (20 mg total) by mouth daily. 90 tablet 6  . senna (SENOKOT) 8.6 MG TABS tablet Take 1 tablet (8.6 mg total) by mouth at bedtime. (Patient taking differently: Take 2 tablets by mouth at bedtime. ) 120 each 0   No current facility-administered medications for this visit.     Social History   Socioeconomic History  . Marital status: Widowed    Spouse name: Not on file  . Number of children: 4  . Years of  education: Not on file  .  Highest education level: Some college, no degree  Occupational History  . Occupation: retired  Scientific laboratory technician  . Financial resource strain: Not on file  . Food insecurity:    Worry: Not on file    Inability: Not on file  . Transportation needs:    Medical: Not on file    Non-medical: Not on file  Tobacco Use  . Smoking status: Never Smoker  . Smokeless tobacco: Never Used  . Tobacco comment: NEVER USED TOBACCO  Substance and Sexual Activity  . Alcohol use: No    Alcohol/week: 0.0 standard drinks  . Drug use: No  . Sexual activity: Not Currently  Lifestyle  . Physical activity:    Days per week: Not on file    Minutes per session: Not on file  . Stress: Not on file  Relationships  . Social connections:    Talks on phone: Not on file    Gets together: Not on file    Attends religious service: Not on file    Active member of club or organization: Not on file    Attends meetings of clubs or organizations: Not on file    Relationship status: Not on file  . Intimate partner violence:    Fear of current or ex partner: Not on file    Emotionally abused: Not on file    Physically abused: Not on file    Forced sexual activity: Not on file  Other Topics Concern  . Not on file  Social History Narrative   Lives alone in an independent living facility.  Has 4 children.  Retired Optometrist for [employer]  Education: some college.     Family History  Problem Relation Age of Onset  . Heart attack Father   . Breast cancer Sister        mets  . Heart attack Brother   . Kidney failure Sister   . Colon cancer Neg Hx   . Esophageal cancer Neg Hx   . Pancreatic cancer Neg Hx   . Liver disease Neg Hx     Procedure Note:  Preop Dx: vulvar mass, history of VIN 3 Postop Dx: same Procedure: biopsy vulva Surgeon: Dorann Ou, MD EBL: <5cc Specimens: right vulva biopsy Complications: none Procedure Details: the patient provided verbal consent and a verbal time out  was performed. The area was wiped with betadine and 1cc of 2% lidocaine was infiltrated into the lesion.  A 62mm punch biopsy was used to sample a representative area of the lesion. It was sent for pathology. Hemostasis was achieved with silver nitrate. She tolerated the procedure well.    Thereasa Solo, MD 05/21/2019, 3:29 PM

## 2019-05-21 NOTE — Patient Instructions (Signed)
Dr Denman George is concerned that you might have a cancer of the vulva on the right side. She took a biopsy of the area today.  If it is positive for cancer you will need a scan (PET scan) and then a surgery to remove the area as well as some lymph nodes.  If it is negative for cancer she will advise you on when to return for follow-up (likely 6 months).

## 2019-05-21 NOTE — Progress Notes (Signed)
Follow-up Note: Gyn-Onc   [name] [name] 83 y.o. female  Chief Complaint  Patient presents with  . 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  S/p biopsy today - will follow this up. If cancer, will order PET and consider radical vulvectomy with inguinofemoral lymphadenectomy (unilateral). If benign, will follow-up in 6 months for surveillance.   HPI: February 19, 2015 the patient underwent a partial simple vulvectomy for VIN 3.  Final pathology showed VIN 3 with a focal positive vaginal margin. She recently saw Dr. Benjie Karvonen who obtained a Pap smear showing ASCUS with high risk HPV in 2019.  In addition a vulvar lesion was seen and biopsied returning VIN 3. Colposcopy with Dr Fermin Schwab in September, 2019 of the vagina did not show high grade lesions.  She underwent wide local excision of the vulva on 10/01/18 for VIN 3. Pathology showed VIN 3 with positive margins at 3 and 9 o'clock focally. No macroscopic residual disease.   Interval History: She was scheduled for follow-up in March, 2020 however this was postponed due to the COVID-10 pandemic. At that time she began experiencing right vulva pain and began to feel a mass.   Review of Systems:10 point review of systems is negative except as noted in interval history.   Vitals: Blood pressure (!) 168/89, pulse 93, temperature 97.8 F (36.6 C), temperature source Oral, resp. rate 19, height 5' 2.5" (1.588 m), weight 212 lb (96.2 kg), SpO2 96 %.  Physical Exam: General : The patient is a healthy woman in no acute distress.  HEENT: normocephalic, extraoccular movements normal; neck is supple without thyromegally  Lynphnodes: Supraclavicular and inguinal nodes not enlarged  Abdomen: Soft, non-tender, no ascites, no organomegally, no masses, no hernias  Pelvic:  EGBUS: At the introitus  (mid way) on the right there is a 2cm exophytic lesion that is friable. It was biopsied. >2cm from midline. Not fixed to underlying structures. Vagina: Normal, no lesions.   Urethra and Bladder: Normal, non-tender  Cervix: Surgically absent  Uterus: Surgically absent  Bi-manual examination: Non-tender; no adenxal masses or nodularity  Rectal: normal sphincter tone, no masses, no blood  Lower extremities: No edema or varicosities. Normal range of motion      Allergies  Allergen Reactions  . Cephalexin Diarrhea  . Imitrex [Sumatriptan] Other (See Comments)    Reaction unknown  . Sulfa Antibiotics Nausea Only    Doesn't like to drink lots of water.    Past Medical History:  Diagnosis Date  . Allergic rhinitis   . Anxiety   . Aortic atherosclerosis ([employer])   . Cardiac pacemaker in situ    DOI  08-04-2008  medtronic dual chamber  . Chronic anticoagulation   . Chronic combined systolic and diastolic CHF, NYHA class 2 ([place])    montior by cardiologist-- dr taylor--  ef 45%  . Coronary artery disease CARDIOLOGIST-- DR Cristopher Peru   per cath-- Non-obstructive minimal CAD  . Fluid retention   . GERD (gastroesophageal reflux disease)   . History of cardiomegaly    Mild  . History of cervical dysplasia    CIN III  . History of CVA (cerebrovascular accident)    09-15-2005  Left Cerebellar Hematoma with mild stem compression whild on coumadin for DVT  . History of DVT of lower extremity   .  History of hemorrhoids   . History of hiatal hernia    stable, small  . History of non-ST elevation myocardial infarction (NSTEMI)    2011-- in setting of bilateral PE's and DVT's and CHF  . History of peptic ulcer disease   . History of pulmonary edema 07/25/2010  . History of pulmonary embolus (PE)   . Hyperlipidemia   . Hypertension   . Incontinence of urine   . LBBB (left bundle branch block)   . LVH (left ventricular hypertrophy) 07/16/2018   Moderate, noted on ECHO  . Mild obstructive  sleep apnea    PT USES Humidied Nasal Mask with O2 at2.5L (study 08-05-2009)  . Mild pulmonary hypertension ([place])   . Moderate intermittent asthma   . OA (osteoarthritis)   . PAF (paroxysmal atrial fibrillation) ([place])   . Peripheral edema   . Renal insufficiency   . Sarcoidosis   . Shortness of breath    OCCASIONAL  . Sigmoid diverticulosis    Mild  . Stokes-Adams syncope    asymptomatic  . Thrombophilia ([place])    hemotologist--  dr Marin Olp--  monitors pt anitcoagulation  . Urgency of urination   . Venous insufficiency   . VIN III (vulvar intraepithelial neoplasia III)   . Wears glasses    reading    Past Surgical History:  Procedure Laterality Date  . ABDOMINAL HYSTERECTOMY  1970's  . APPENDECTOMY  1966  . BIV UPGRADE  12/20/2018  . BIV UPGRADE N/A 12/20/2018   Procedure: BIV PPM UPGRADE;  Surgeon: Evans Lance, MD;  Location: [facility];  Service: Cardiovascular;  Laterality: N/A;  . CARDIAC CATHETERIZATION  08-26-2003   dr Lorenz Coaster. non-obstructive CAD & LVD with ef 46%, global HK, 2+MR, PA press 50/24 w/ wedge 20  . CARDIAC CATHETERIZATION  08-03-2008   dr Angelena Form   Non-obstructive CAD/  30-40% pLAD,  30% ostial CFX/  symptomatic bradycardia/  normal renal arteries  . CARDIAC CATHETERIZATION N/A 07/21/2016   Procedure: Right Heart Cath;  Surgeon: Larey Dresser, MD;  Location: [place];  Service: Cardiovascular;  Laterality: N/A;  . CARDIAC PACEMAKER PLACEMENT  08-04-2008  dr Lovena Le gregg   for CHB--  MEDTRONIC DUAL CHAMBER  . CATARACTS REMOVED    . CHOLECYSTECTOMY  01/1996  . DECCOMPRESSION ULNAR NERVE AT CUBITAL TUNNEL AND RESECTION MUSCLE Left 03-17-2008  . EXCISION LEFT SUPRACLAVICULAR LYMPH NODE   04-22-2007   granulomatous inflammation, extensive  . INSERTION OF VENA CAVA FILTER  08/ 2011  . KNEE ARTHROSCOPY     LEFT  . LUMBAR [place] SURGERY  x2   1990  . SHOULDER ARTHROSCOPY WITH ROTATOR CUFF REPAIR AND SUBACROMIAL DECOMPRESSION Right  02-08-2009  . TOTAL KNEE ARTHROPLASTY Left 04/21/2013   Procedure: LEFT TOTAL KNEE ARTHROPLASTY;  Surgeon: Gearlean Alf, MD;  [facility];  Service: Orthopedics;  Laterality: Left;  . TOTAL KNEE ARTHROPLASTY Right 04/03/2016   Procedure: RIGHT TOTAL KNEE ARTHROPLASTY;  Surgeon: Gaynelle Arabian, MD;  Location: [facility];  Service: Orthopedics;  Laterality: Right;  . TRANSTHORACIC ECHOCARDIOGRAM  last one 05-19-2014   mild LVH/  ef 45-50%/  septal-lateral dyssynchroncy consistant with LBBB/  diffuse hypokinesis/  grade I diastolic dysfunction/  trivial MR and TR/  mild LAE  . TUBAL LIGATION    . VULVAR LESION REMOVAL  07/16/2012   Procedure: VULVAR LESION;  Surgeon: Imagene Gurney A. Alycia Rossetti, MD;  [facility];  Service: Gynecology;  Laterality: Right;  Wide  Local Excision of the Vulvar  . VULVECTOMY N/A 02/19/2015   Procedure: SIMPLE PARTIAL VULVECTOMY;  Surgeon: [name], MD;  [facility];  Service: Gynecology;  Laterality: N/A;  . VULVECTOMY PARTIAL N/A 10/01/2018   Procedure: VULVECTOMY PARTIAL;  Surgeon: [name], MD;  Location: [facility];  Service: Gynecology;  Laterality: N/A;    Current Outpatient Medications  Medication Sig Dispense Refill  . acetaminophen (TYLENOL) 325 MG tablet Take 1-2 tablets (325-650 mg total) by mouth every 4 (four) hours as needed for mild pain.    . Azelastine HCl 0.15 % SOLN USE 1 TO 2 SPRAYS IN EACH  NOSTRIL DAILY (Patient taking differently: Place 1-2 sprays into both nostrils daily as needed (congestion). ) 30 mL 5  . budesonide-formoterol (SYMBICORT) 160-4.5 MCG/ACT inhaler Inhale 2 puffs into the lungs 2 (two) times daily.    . Calcium Carbonate-Vit D-Min (CALCIUM 1200) 1200-1000 MG-UNIT CHEW Chew 1 tablet by mouth daily.    . cetirizine (ZYRTEC) 10 MG tablet Take 10 mg by mouth every morning.     . clorazepate (TRANXENE) 7.5 MG tablet Take 1 tablet (7.5 mg total) by mouth 3 (three) times daily. 90 tablet 5  .  furosemide (LASIX) 40 MG tablet TAKE 1 TABLET BY MOUTH  TWICE A DAY 180 tablet 3  . gabapentin (NEURONTIN) 100 MG capsule 3 CAPSULES AT BEDTIME. 270 capsule 2  . lidocaine (XYLOCAINE) 5 % ointment Apply to right thigh twice daily as needed for pain. 50 g 5  . losartan (COZAAR) 25 MG tablet Take 25 mg by mouth daily.     . magic mouthwash SOLN Swish and swallow 5 ml 4 times daily if needed (Patient taking differently: Take 5 mLs by mouth 4 (four) times daily as needed for mouth pain (sore throat). ) 200 mL 2  . metoprolol succinate (TOPROL-XL) 25 MG 24 hr tablet Take 1 tablet by mouth  daily before breakfast (Patient taking differently: Take 25 mg by mouth daily. ) 90 tablet 3  . montelukast (SINGULAIR) 10 MG tablet Take 10 mg by mouth daily.     . Multiple Vitamins-Minerals (PROTEGRA) CAPS Take 1 capsule by mouth daily.    [name] Kitchen oxyCODONE (OXY IR/ROXICODONE) 5 MG immediate release tablet Take 1 tablet (5 mg total) by mouth every 4 (four) hours as needed for severe pain. 20 tablet 0  . pantoprazole (PROTONIX) 40 MG tablet Take 1 tablet by mouth  twice a day 180 tablet 3  . Polyethyl Glycol-Propyl Glycol (SYSTANE) 0.4-0.3 % SOLN Place 2 drops into both eyes 4 (four) times daily as needed (dryness).     . potassium chloride (K-DUR,KLOR-CON) 10 MEQ tablet TAKE 2 TABLETS BY MOUTH  DAILY (Patient taking differently: Take 10 mEq by mouth 2 (two) times daily. ) 180 tablet 3  . pravastatin (PRAVACHOL) 20 MG tablet Take 20 mg by mouth at bedtime.     . rivaroxaban (XARELTO) 20 MG TABS tablet Take 1 tablet (20 mg total) by mouth daily. 90 tablet 6  . senna (SENOKOT) 8.6 MG TABS tablet Take 1 tablet (8.6 mg total) by mouth at bedtime. (Patient taking differently: Take 2 tablets by mouth at bedtime. ) 120 each 0   No current facility-administered medications for this visit.     Social History   Socioeconomic History  . Marital status: Widowed    Spouse name: Not on file  . Number of children: 4  . Years of  education: Not on file  .  Highest education level: Some college, no degree  Occupational History  . Occupation: retired  Scientific laboratory technician  . Financial resource strain: Not on file  . Food insecurity:    Worry: Not on file    Inability: Not on file  . Transportation needs:    Medical: Not on file    Non-medical: Not on file  Tobacco Use  . Smoking status: Never Smoker  . Smokeless tobacco: Never Used  . Tobacco comment: NEVER USED TOBACCO  Substance and Sexual Activity  . Alcohol use: No    Alcohol/week: 0.0 standard drinks  . Drug use: No  . Sexual activity: Not Currently  Lifestyle  . Physical activity:    Days per week: Not on file    Minutes per session: Not on file  . Stress: Not on file  Relationships  . Social connections:    Talks on phone: Not on file    Gets together: Not on file    Attends religious service: Not on file    Active member of club or organization: Not on file    Attends meetings of clubs or organizations: Not on file    Relationship status: Not on file  . Intimate partner violence:    Fear of current or ex partner: Not on file    Emotionally abused: Not on file    Physically abused: Not on file    Forced sexual activity: Not on file  Other Topics Concern  . Not on file  Social History Narrative   Lives alone in an independent living facility.  Has 4 children.  Retired Optometrist for [employer]  Education: some college.     Family History  Problem Relation Age of Onset  . Heart attack Father   . Breast cancer Sister        mets  . Heart attack Brother   . Kidney failure Sister   . Colon cancer Neg Hx   . Esophageal cancer Neg Hx   . Pancreatic cancer Neg Hx   . Liver disease Neg Hx     Procedure Note:  Preop Dx: vulvar mass, history of VIN 3 Postop Dx: same Procedure: biopsy vulva Surgeon: Dorann Ou, MD EBL: <5cc Specimens: right vulva biopsy Complications: none Procedure Details: the patient provided verbal consent and a verbal time out  was performed. The area was wiped with betadine and 1cc of 2% lidocaine was infiltrated into the lesion.  A 24mm punch biopsy was used to sample a representative area of the lesion. It was sent for pathology. Hemostasis was achieved with silver nitrate. She tolerated the procedure well.    Thereasa Solo, MD 05/21/2019, 3:29 PM

## 2019-05-22 ENCOUNTER — Telehealth: Payer: Self-pay | Admitting: Neurology

## 2019-05-22 NOTE — Telephone Encounter (Signed)
Patient called regarding her medication and needing to know how much to take of it. She cannot remember the name of it. Please Call. Thanks

## 2019-05-22 NOTE — Telephone Encounter (Signed)
Spoke with patient regarding gabapentin.  She is going to take 3 capsules at bedtime and hope it helps and then she wont be sleepy during the day.

## 2019-05-26 ENCOUNTER — Telehealth: Payer: Self-pay

## 2019-05-26 ENCOUNTER — Telehealth: Payer: Self-pay | Admitting: Oncology

## 2019-05-26 ENCOUNTER — Other Ambulatory Visit: Payer: Self-pay | Admitting: Gynecologic Oncology

## 2019-05-26 DIAGNOSIS — Z950 Presence of cardiac pacemaker: Secondary | ICD-10-CM

## 2019-05-26 DIAGNOSIS — I2782 Chronic pulmonary embolism: Secondary | ICD-10-CM

## 2019-05-26 DIAGNOSIS — I272 Pulmonary hypertension, unspecified: Secondary | ICD-10-CM

## 2019-05-26 MED ORDER — ENOXAPARIN SODIUM 80 MG/0.8ML ~~LOC~~ SOLN: 80.0000 mg | Freq: Two times a day (BID) | SUBCUTANEOUS | 0 refills | Status: DC

## 2019-05-26 NOTE — Telephone Encounter (Signed)
Left a message for [name] [name] advising her that she will need to quarantine starting on 05/29/19 for surgery on 06/12/19.

## 2019-05-26 NOTE — Telephone Encounter (Signed)
Told Ms Landgren that the biopsy showed pre-cancer. No cancer was seen per [name], NP. She is set up for a wide local excision at the surgery Center on 06-12-19 at 11 am This will be outpatient. She will get a call from the [facility] Nurse to discuss when to be at the hospital etc. She will also be scheduled for a Covid test pre op as well.  She will need to quarantine her self after the testing until the surgery. She will take her last dose of  her Gwenyth Bouillon on June 17,2020.  Begin Lovenox injections bid on 06-05-19. The last dose of lovenox will be the evening of June 24,2020. Pt verbalized understanding.

## 2019-05-29 ENCOUNTER — Telehealth: Payer: Self-pay | Admitting: *Deleted

## 2019-05-29 NOTE — Telephone Encounter (Signed)
Patient regarding her lovenox before surgery. Patient stated "I was told to take it once a day, but the instructions say twice day. Which is correct." Message given to [facility] APP

## 2019-05-29 NOTE — Telephone Encounter (Signed)
Spoke with Ms Leisinger and told her that the lovenox  Is to be taken twice a day.  She had 13 sent into her pharmacy.  It should have been 14.  [name], NP sent in one more injection to make #14. Pt verbalized understanding.

## 2019-06-03 ENCOUNTER — Telehealth: Payer: Self-pay | Admitting: Oncology

## 2019-06-03 NOTE — Telephone Encounter (Signed)
Called [facility] and reviewed instructions for surgery - she will take her last dose of Xarelto on 6/17, start lovenox BID on 6/18 with last dose the evening of 06/11/19.  She verbalized understanding in teach back mode.

## 2019-06-09 ENCOUNTER — Other Ambulatory Visit (HOSPITAL_COMMUNITY)
Admission: RE | Admit: 2019-06-09 | Discharge: 2019-06-09 | Disposition: A | Discharge status: Home / Self Care | Payer: Medicare Other | Source: Ambulatory Visit | Attending: Gynecologic Oncology | Admitting: Gynecologic Oncology

## 2019-06-09 ENCOUNTER — Other Ambulatory Visit: Payer: Self-pay

## 2019-06-09 DIAGNOSIS — Z1159 Encounter for screening for other viral diseases: Secondary | ICD-10-CM | POA: Insufficient documentation

## 2019-06-09 LAB — SARS CORONAVIRUS 2 (TAT 6-24 HRS): SARS Coronavirus 2: NEGATIVE

## 2019-06-11 ENCOUNTER — Telehealth: Payer: Self-pay | Admitting: Oncology

## 2019-06-11 ENCOUNTER — Encounter (HOSPITAL_BASED_OUTPATIENT_CLINIC_OR_DEPARTMENT_OTHER): Payer: Self-pay | Admitting: *Deleted

## 2019-06-11 ENCOUNTER — Other Ambulatory Visit: Payer: Self-pay

## 2019-06-11 NOTE — Progress Notes (Addendum)
Spoke w/ pt via phone for pre-op interview.  Npo after mn.  Arrive at 0900.  Needs istat 8.  Current ekg and cxr in chart and epic.  Will take protonix, toprol, clorzepate, singulair, and do symbicort inhaler am dos w/ sips of water.  Pt last pacer check in epic dated 03-25-2019.  Pt cardiology lov note dated 03-26-2019 in epic and chart.  Pt aware and is followed instructions given by dr Denman George office when to stop xarelto and bridge with levonox.  Pt denies any cardiac S&S, SOB , or difficulty breathing.  Chart to be reviewed by anesthesia, Chip Boer PA.  ADDENDUM:  Per anesthesia ok to proceed.

## 2019-06-11 NOTE — Progress Notes (Signed)
Anesthesia Chart Review   Case: [id number] Date/Time: 06/12/19 1045   Procedure: PARTIAL VULVECTOMY (N/A )   Anesthesia type: Choice   Pre-op diagnosis: VULVAR DYSPLASIA   Location: [place] [facility] ROOM 3 / [place]   Surgeon: [name], MD      DISCUSSION:82 y.o. never smoker with h/o HTN, GERD, HLD, anxiety, combined systolic and diastolic CHF (EF 32% to 67% 07/16/18), CHB with pacemaker (remote check 03/25/2019), PAF (on Xarelto), LBBB, CVA 2006, nonobstructive CAD, sleep apnea (uses humidified nasal mask with O2 at 2.5L at night), sarcoidosis, PE, renal insufficiency, asthma, vulvar dysplasia scheduled for above procedure 06/12/2019 with Dr. [name].    Pt will stop Xarelto 6/17 and will require Lovenox bridge, instructions reviewed with patient.   Last seen by cardiologist, Dr. Cristopher Peru 03/26/2019.  Stable at this visit with follow up in August.  VS: Ht 5' 2.5" (1.588 m)   Wt 96 kg   BMI 38.09 kg/m   PROVIDERS: Maurice Small, MD is PCP   Cristopher Peru, MD is Cardiologist  LABS: Labs DOS, SDW (all labs ordered are listed, but only abnormal results are displayed)  Labs Reviewed - No data to display   IMAGES:   EKG: 12/21/2018 Rate 74 bpm Sinus rhythm with 1st degree A-V block Left axis deviation Non-specific intra-ventricular conduction block Possible Lateral infarct , age undetermined Abnormal ECG  CV: Echo 07/16/18 Study Conclusions  - Left ventricle: The cavity size was normal. There was moderate   concentric hypertrophy. Systolic function was mildly to   moderately reduced. The estimated ejection fraction was in the   range of 40% to 45%. Mild diffuse hypokinesis with regional   variations. There was an increased relative contribution of   atrial contraction to ventricular filling. Doppler parameters are   consistent with high ventricular filling pressure. - Ventricular septum: Septal motion showed moderate paradox. These   changes are consistent  with right ventricular pacing. - Aortic valve: Trileaflet; normal thickness, mildly calcified   leaflets. - Mitral valve: Calcified annulus. - Left atrium: The atrium was mildly dilated. - Tricuspid valve: There was mild regurgitation. - Pulmonary arteries: PA peak pressure: 40 mm Hg (S).  Impressions:  - The right ventricular systolic pressure was increased consistent   with mild pulmonary hypertension.  Cardiac Cath 07/21/16 1. Normal filling pressures.  2. Borderline elevated PA pressure.   Patient does not appear to have clinically significant pulmonary hypertension, does not need specific treatment. She is adequately diuresed.  Past Medical History:  Diagnosis Date  . Allergic rhinitis   . Anxiety   . Aortic atherosclerosis ([place])   . Cardiac pacemaker in situ    DOI  08-04-2008  medtronic dual chamber  . Chronic anticoagulation   . Chronic combined systolic and diastolic CHF, NYHA class 2 ([place])    montior by cardiologist-- dr taylor--  ef 45%  . Coronary artery disease CARDIOLOGIST-- DR Cristopher Peru   per cath-- Non-obstructive minimal CAD  . Fluid retention   . GERD (gastroesophageal reflux disease)   . Hemorrhoids   . Hiatal hernia   . History of cervical dysplasia    CIN III  . History of CVA (cerebrovascular accident)    09-15-2005  Left Cerebellar Hematoma with mild stem compression whild on coumadin for DVT  . History of DVT of lower extremity   . History of non-ST elevation myocardial infarction (NSTEMI)    2011-- in setting of bilateral PE's and DVT's and CHF  .  History of peptic ulcer disease   . History of pulmonary edema 07/25/2010  . History of pulmonary embolus (PE)   . Hyperlipidemia   . Hypertension   . Incontinence of urine   . LBBB (left bundle branch block)   . LVH (left ventricular hypertrophy) 07/16/2018   Moderate, noted on ECHO  . Mild obstructive sleep apnea    PT USES Humidied Nasal Mask with O2 at2.5L (study 08-05-2009)  . Mild  pulmonary hypertension ([place])   . Moderate intermittent asthma   . Nocturnal leg cramps   . OA (osteoarthritis)   . PAF (paroxysmal atrial fibrillation) ([place])   . Peripheral edema   . Renal insufficiency   . Sarcoidosis   . Shortness of breath    OCCASIONAL  . Sigmoid diverticulosis    Mild  . Stokes-Adams syncope    asymptomatic  . Thrombophilia ([place])    hemotologist--  dr Marin Olp--  monitors pt anitcoagulation  . Urgency of urination   . Venous insufficiency   . VIN III (vulvar intraepithelial neoplasia III)   . Wears glasses    reading    Past Surgical History:  Procedure Laterality Date  . ABDOMINAL HYSTERECTOMY  1970's  . APPENDECTOMY  1966  . BIV UPGRADE  12/20/2018  . BIV UPGRADE N/A 12/20/2018   Procedure: BIV PPM UPGRADE;  Surgeon: Evans Lance, MD;  Location: [place] [facility];  Service: Cardiovascular;  Laterality: N/A;  . CARDIAC CATHETERIZATION  08-26-2003   dr Lorenz Coaster. non-obstructive CAD & LVD with ef 46%, global HK, 2+MR, PA press 50/24 w/ wedge 20  . CARDIAC CATHETERIZATION  08-03-2008   dr Angelena Form   Non-obstructive CAD/  30-40% pLAD,  30% ostial CFX/  symptomatic bradycardia/  normal renal arteries  . CARDIAC CATHETERIZATION N/A 07/21/2016   Procedure: Right Heart Cath;  Surgeon: Larey Dresser, MD;  Location: [place] [facility];  Service: Cardiovascular;  Laterality: N/A;  . CARDIAC PACEMAKER PLACEMENT  08-04-2008  dr Lovena Le gregg   for CHB--  MEDTRONIC DUAL CHAMBER  . CATARACTS REMOVED    . CHOLECYSTECTOMY  01/1996  . DECCOMPRESSION ULNAR NERVE AT CUBITAL TUNNEL AND RESECTION MUSCLE Left 03-17-2008  . EXCISION LEFT SUPRACLAVICULAR LYMPH NODE   04-22-2007   granulomatous inflammation, extensive  . INSERTION OF VENA CAVA FILTER  08/ 2011  . KNEE ARTHROSCOPY     LEFT  . LUMBAR [place] SURGERY  x2   1990  . SHOULDER ARTHROSCOPY WITH ROTATOR CUFF REPAIR AND SUBACROMIAL DECOMPRESSION Right 02-08-2009  . TOTAL KNEE ARTHROPLASTY Left 04/21/2013    Procedure: LEFT TOTAL KNEE ARTHROPLASTY;  Surgeon: Gearlean Alf, MD;  [facility];  Service: Orthopedics;  Laterality: Left;  . TOTAL KNEE ARTHROPLASTY Right 04/03/2016   Procedure: RIGHT TOTAL KNEE ARTHROPLASTY;  Surgeon: Gaynelle Arabian, MD;  [facility];  Service: Orthopedics;  Laterality: Right;  . TRANSTHORACIC ECHOCARDIOGRAM  last one 05-19-2014   mild LVH/  ef 45-50%/  septal-lateral dyssynchroncy consistant with LBBB/  diffuse hypokinesis/  grade I diastolic dysfunction/  trivial MR and TR/  mild LAE  . TUBAL LIGATION    . VULVAR LESION REMOVAL  07/16/2012   Procedure: VULVAR LESION;  Surgeon: Imagene Gurney A. Alycia Rossetti, MD;  [facility];  Service: Gynecology;  Laterality: Right;  Wide Local Excision of the Vulvar  . VULVECTOMY N/A 02/19/2015   Procedure: SIMPLE PARTIAL VULVECTOMY;  Surgeon: [name], MD;  Location: [facility];  Service: Gynecology;  Laterality: N/A;  .  VULVECTOMY PARTIAL N/A 10/01/2018   Procedure: VULVECTOMY PARTIAL;  Surgeon: [name], MD;  Location: [facility];  Service: Gynecology;  Laterality: N/A;    MEDICATIONS: No current facility-administered medications for this encounter.    [name] Kitchen acetaminophen (TYLENOL) 325 MG tablet  . Azelastine HCl 0.15 % SOLN  . budesonide-formoterol (SYMBICORT) 160-4.5 MCG/ACT inhaler  . Calcium Carbonate-Vit D-Min (CALCIUM 1200) 1200-1000 MG-UNIT CHEW  . cetirizine (ZYRTEC) 10 MG tablet  . clorazepate (TRANXENE) 7.5 MG tablet  . enoxaparin (LOVENOX) 80 MG/0.8ML injection  . furosemide (LASIX) 40 MG tablet  . gabapentin (NEURONTIN) 100 MG capsule  . lidocaine (XYLOCAINE) 5 % ointment  . losartan (COZAAR) 25 MG tablet  . magic mouthwash SOLN  . metoprolol succinate (TOPROL-XL) 25 MG 24 hr tablet  . montelukast (SINGULAIR) 10 MG tablet  . Multiple Vitamins-Minerals (PROTEGRA) CAPS  . pantoprazole (PROTONIX) 40 MG tablet  . Polyethyl Glycol-Propyl Glycol (SYSTANE) 0.4-0.3 % SOLN  . potassium  chloride (K-DUR,KLOR-CON) 10 MEQ tablet  . pravastatin (PRAVACHOL) 20 MG tablet  . senna (SENOKOT) 8.6 MG TABS tablet  . rivaroxaban (XARELTO) 20 MG TABS tablet     [facility] Pre-Surgical Testing [phone] 06/11/19  3:20 PM

## 2019-06-11 NOTE — Progress Notes (Signed)

## 2019-06-11 NOTE — Anesthesia Preprocedure Evaluation (Addendum)
Anesthesia Evaluation  Patient identified by MRN, date of birth, ID band Patient awake    Reviewed: Allergy & Precautions, NPO status , Patient's Chart, lab work & pertinent test results, reviewed documented beta blocker date and time   History of Anesthesia Complications Negative for: history of anesthetic complications  Airway Mallampati: II  TM Distance: >3 FB Neck ROM: Full    Dental  (+) Dental Advisory Given   Pulmonary sleep apnea and Oxygen sleep apnea , PE sarcoidosis   breath sounds clear to auscultation       Cardiovascular hypertension, Pt. on medications and Pt. on home beta blockers (-) angina+ CAD ('17 cath: minimal non-obstructive coronary disease) and + DVT  + dysrhythmias Atrial Fibrillation + pacemaker  Rhythm:Regular Rate:Normal  '19 ECHO: EF 40% to 45%     Neuro/Psych Anxiety CVA (L weakness, walks with walker), Residual Symptoms    GI/Hepatic Neg liver ROS, GERD  Controlled,  Endo/Other  Morbid obesity  Renal/GU negative Renal ROS     Musculoskeletal   Abdominal (+) + obese,   Peds  Hematology xarelto   Anesthesia Other Findings   Reproductive/Obstetrics                           Anesthesia Physical Anesthesia Plan  ASA: III  Anesthesia Plan: General   Post-op Pain Management:    Induction: Intravenous  PONV Risk Score and Plan: 3 and Ondansetron, Dexamethasone and Treatment may vary due to age or medical condition  Airway Management Planned: LMA  Additional Equipment:   Intra-op Plan:   Post-operative Plan:   Informed Consent: I have reviewed the patients History and Physical, chart, labs and discussed the procedure including the risks, benefits and alternatives for the proposed anesthesia with the patient or authorized representative who has indicated his/her understanding and acceptance.     Dental advisory given  Plan Discussed with: CRNA and  Surgeon  Anesthesia Plan Comments: (See PAT note 06/11/2019, Konrad Felix, PA-C)      Anesthesia Quick Evaluation

## 2019-06-11 NOTE — Telephone Encounter (Signed)
Called [name] [name] and asked if she had any questions about surgery tomorrow.  She did not have any and said she will take her last dose of Lovenox tonight.

## 2019-06-12 ENCOUNTER — Ambulatory Visit (HOSPITAL_BASED_OUTPATIENT_CLINIC_OR_DEPARTMENT_OTHER): Payer: Medicare Other | Admitting: Physician Assistant

## 2019-06-12 ENCOUNTER — Ambulatory Visit (HOSPITAL_BASED_OUTPATIENT_CLINIC_OR_DEPARTMENT_OTHER)
Admission: RE | Admit: 2019-06-12 | Discharge: 2019-06-12 | Disposition: A | Discharge status: Home / Self Care | Payer: Medicare Other | Attending: Gynecologic Oncology | Admitting: Gynecologic Oncology

## 2019-06-12 ENCOUNTER — Encounter (HOSPITAL_BASED_OUTPATIENT_CLINIC_OR_DEPARTMENT_OTHER)
Admission: RE | Disposition: A | Discharge status: Home / Self Care | Payer: Self-pay | Source: Home / Self Care | Attending: Gynecologic Oncology

## 2019-06-12 ENCOUNTER — Encounter (HOSPITAL_BASED_OUTPATIENT_CLINIC_OR_DEPARTMENT_OTHER): Payer: Self-pay

## 2019-06-12 DIAGNOSIS — G4733 Obstructive sleep apnea (adult) (pediatric): Secondary | ICD-10-CM | POA: Diagnosis not present

## 2019-06-12 DIAGNOSIS — I5042 Chronic combined systolic (congestive) and diastolic (congestive) heart failure: Secondary | ICD-10-CM | POA: Insufficient documentation

## 2019-06-12 DIAGNOSIS — R32 Unspecified urinary incontinence: Secondary | ICD-10-CM | POA: Insufficient documentation

## 2019-06-12 DIAGNOSIS — I2510 Atherosclerotic heart disease of native coronary artery without angina pectoris: Secondary | ICD-10-CM | POA: Insufficient documentation

## 2019-06-12 DIAGNOSIS — I872 Venous insufficiency (chronic) (peripheral): Secondary | ICD-10-CM | POA: Diagnosis not present

## 2019-06-12 DIAGNOSIS — Z888 Allergy status to other drugs, medicaments and biological substances status: Secondary | ICD-10-CM | POA: Insufficient documentation

## 2019-06-12 DIAGNOSIS — I480 Paroxysmal atrial fibrillation: Secondary | ICD-10-CM | POA: Diagnosis not present

## 2019-06-12 DIAGNOSIS — Z86711 Personal history of pulmonary embolism: Secondary | ICD-10-CM | POA: Insufficient documentation

## 2019-06-12 DIAGNOSIS — Z8711 Personal history of peptic ulcer disease: Secondary | ICD-10-CM | POA: Insufficient documentation

## 2019-06-12 DIAGNOSIS — M1990 Unspecified osteoarthritis, unspecified site: Secondary | ICD-10-CM | POA: Diagnosis not present

## 2019-06-12 DIAGNOSIS — Z7901 Long term (current) use of anticoagulants: Secondary | ICD-10-CM | POA: Diagnosis not present

## 2019-06-12 DIAGNOSIS — D869 Sarcoidosis, unspecified: Secondary | ICD-10-CM | POA: Diagnosis not present

## 2019-06-12 DIAGNOSIS — I700 Atherosclerosis of aorta: Secondary | ICD-10-CM | POA: Diagnosis not present

## 2019-06-12 DIAGNOSIS — K219 Gastro-esophageal reflux disease without esophagitis: Secondary | ICD-10-CM | POA: Insufficient documentation

## 2019-06-12 DIAGNOSIS — Z79899 Other long term (current) drug therapy: Secondary | ICD-10-CM | POA: Insufficient documentation

## 2019-06-12 DIAGNOSIS — I272 Pulmonary hypertension, unspecified: Secondary | ICD-10-CM | POA: Insufficient documentation

## 2019-06-12 DIAGNOSIS — F419 Anxiety disorder, unspecified: Secondary | ICD-10-CM | POA: Insufficient documentation

## 2019-06-12 DIAGNOSIS — I252 Old myocardial infarction: Secondary | ICD-10-CM | POA: Insufficient documentation

## 2019-06-12 DIAGNOSIS — Z7951 Long term (current) use of inhaled steroids: Secondary | ICD-10-CM | POA: Insufficient documentation

## 2019-06-12 DIAGNOSIS — I110 Hypertensive heart disease with heart failure: Secondary | ICD-10-CM | POA: Diagnosis not present

## 2019-06-12 DIAGNOSIS — Z6837 Body mass index (BMI) 37.0-37.9, adult: Secondary | ICD-10-CM | POA: Insufficient documentation

## 2019-06-12 DIAGNOSIS — Z950 Presence of cardiac pacemaker: Secondary | ICD-10-CM | POA: Diagnosis not present

## 2019-06-12 DIAGNOSIS — Z882 Allergy status to sulfonamides status: Secondary | ICD-10-CM | POA: Diagnosis not present

## 2019-06-12 DIAGNOSIS — I69354 Hemiplegia and hemiparesis following cerebral infarction affecting left non-dominant side: Secondary | ICD-10-CM | POA: Insufficient documentation

## 2019-06-12 DIAGNOSIS — D071 Carcinoma in situ of vulva: Secondary | ICD-10-CM | POA: Diagnosis present

## 2019-06-12 DIAGNOSIS — Z86718 Personal history of other venous thrombosis and embolism: Secondary | ICD-10-CM | POA: Insufficient documentation

## 2019-06-12 DIAGNOSIS — J4520 Mild intermittent asthma, uncomplicated: Secondary | ICD-10-CM | POA: Insufficient documentation

## 2019-06-12 DIAGNOSIS — E785 Hyperlipidemia, unspecified: Secondary | ICD-10-CM | POA: Diagnosis not present

## 2019-06-12 DIAGNOSIS — K449 Diaphragmatic hernia without obstruction or gangrene: Secondary | ICD-10-CM | POA: Insufficient documentation

## 2019-06-12 HISTORY — DX: Unspecified hemorrhoids: K64.9

## 2019-06-12 HISTORY — DX: Diaphragmatic hernia without obstruction or gangrene: K44.9

## 2019-06-12 HISTORY — PX: VULVECTOMY: SHX1086

## 2019-06-12 HISTORY — DX: Sleep related leg cramps: G47.62

## 2019-06-12 LAB — POCT I-STAT, CHEM 8
BUN: 21 mg/dL (ref 8–23)
Calcium, Ion: 1.19 mmol/L (ref 1.15–1.40)
Chloride: 106 mmol/L (ref 98–111)
Creatinine, Ser: 1.1 mg/dL — ABNORMAL HIGH (ref 0.44–1.00)
Glucose, Bld: 108 mg/dL — ABNORMAL HIGH (ref 70–99)
HCT: 41 % (ref 36.0–46.0)
Hemoglobin: 13.9 g/dL (ref 12.0–15.0)
Potassium: 3.9 mmol/L (ref 3.5–5.1)
Sodium: 144 mmol/L (ref 135–145)
TCO2: 29 mmol/L (ref 22–32)

## 2019-06-12 SURGERY — VULVECTOMY
Anesthesia: General | Site: Perineum

## 2019-06-12 MED ORDER — BUPIVACAINE LIPOSOME 1.3 % IJ SUSP
INTRAMUSCULAR | Status: DC | PRN
  Administered 2019-06-12: 10 mL

## 2019-06-12 MED ORDER — SENNA 8.6 MG PO TABS: 1.0000 | ORAL_TABLET | Freq: Every day | ORAL | 0 refills | Status: AC

## 2019-06-12 MED ORDER — DEXAMETHASONE SODIUM PHOSPHATE 10 MG/ML IJ SOLN
INTRAMUSCULAR | Status: AC
  Filled 2019-06-12: qty 1

## 2019-06-12 MED ORDER — FENTANYL CITRATE (PF) 100 MCG/2ML IJ SOLN
25.0000 ug | INTRAMUSCULAR | Status: DC | PRN
  Filled 2019-06-12: qty 1

## 2019-06-12 MED ORDER — ACETAMINOPHEN 500 MG PO TABS
ORAL_TABLET | ORAL | Status: AC
  Filled 2019-06-12: qty 2

## 2019-06-12 MED ORDER — ACETAMINOPHEN 500 MG PO TABS
1000.0000 mg | ORAL_TABLET | Freq: Once | ORAL | Status: AC
  Administered 2019-06-12: 1000 mg via ORAL
  Filled 2019-06-12: qty 2

## 2019-06-12 MED ORDER — EPHEDRINE 5 MG/ML INJ
INTRAVENOUS | Status: AC
  Filled 2019-06-12: qty 10

## 2019-06-12 MED ORDER — FENTANYL CITRATE (PF) 100 MCG/2ML IJ SOLN
INTRAMUSCULAR | Status: AC
  Filled 2019-06-12: qty 2

## 2019-06-12 MED ORDER — LIDOCAINE HCL 1 % IJ SOLN
INTRAMUSCULAR | Status: DC | PRN
  Administered 2019-06-12: 10 mL

## 2019-06-12 MED ORDER — OXYCODONE-ACETAMINOPHEN 5-325 MG PO TABS: 1.0000 | ORAL_TABLET | ORAL | 0 refills | Status: AC | PRN

## 2019-06-12 MED ORDER — FENTANYL CITRATE (PF) 100 MCG/2ML IJ SOLN
INTRAMUSCULAR | Status: DC | PRN
  Administered 2019-06-12: 50 ug via INTRAVENOUS

## 2019-06-12 MED ORDER — LACTATED RINGERS IV SOLN
INTRAVENOUS | Status: DC
  Administered 2019-06-12: 10:00:00 via INTRAVENOUS
  Filled 2019-06-12: qty 1000

## 2019-06-12 MED ORDER — LIDOCAINE 2% (20 MG/ML) 5 ML SYRINGE
INTRAMUSCULAR | Status: DC | PRN
  Administered 2019-06-12: 40 mg via INTRAVENOUS

## 2019-06-12 MED ORDER — ONDANSETRON HCL 4 MG/2ML IJ SOLN
INTRAMUSCULAR | Status: DC | PRN
  Administered 2019-06-12: 4 mg via INTRAVENOUS

## 2019-06-12 MED ORDER — LIDOCAINE 2% (20 MG/ML) 5 ML SYRINGE
INTRAMUSCULAR | Status: AC
  Filled 2019-06-12: qty 5

## 2019-06-12 MED ORDER — EPHEDRINE SULFATE-NACL 50-0.9 MG/10ML-% IV SOSY
PREFILLED_SYRINGE | INTRAVENOUS | Status: DC | PRN
  Administered 2019-06-12: 10 mg via INTRAVENOUS

## 2019-06-12 MED ORDER — ONDANSETRON HCL 4 MG/2ML IJ SOLN
INTRAMUSCULAR | Status: AC
  Filled 2019-06-12: qty 2

## 2019-06-12 MED ORDER — PROPOFOL 10 MG/ML IV BOLUS
INTRAVENOUS | Status: DC | PRN
  Administered 2019-06-12: 120 mg via INTRAVENOUS

## 2019-06-12 MED ORDER — ACETIC ACID 5 % SOLN
Status: DC | PRN
  Administered 2019-06-12: 1 via TOPICAL

## 2019-06-12 MED ORDER — DEXAMETHASONE SODIUM PHOSPHATE 4 MG/ML IJ SOLN
INTRAMUSCULAR | Status: DC | PRN
  Administered 2019-06-12: 5 mg via INTRAVENOUS

## 2019-06-12 MED ORDER — PROPOFOL 10 MG/ML IV BOLUS
INTRAVENOUS | Status: AC
  Filled 2019-06-12: qty 20

## 2019-06-12 SURGICAL SUPPLY — 29 items
BLADE CLIPPER SENSICLIP SURGIC (BLADE) IMPLANT
BLADE SURG 15 STRL LF DISP TIS (BLADE) ×1 IMPLANT
BLADE SURG 15 STRL SS (BLADE) ×1
CANISTER SUCT 3000ML PPV (MISCELLANEOUS) ×2 IMPLANT
CATH ROBINSON RED A/P 14FR (CATHETERS) ×2 IMPLANT
COVER WAND RF STERILE (DRAPES) ×2 IMPLANT
GAUZE 4X4 16PLY RFD (DISPOSABLE) ×2 IMPLANT
GLOVE BIO SURGEON STRL SZ 6 (GLOVE) ×4 IMPLANT
GLOVE BIO SURGEON STRL SZ 6.5 (GLOVE) ×1 IMPLANT
GLOVE BIOGEL PI IND STRL 6.5 (GLOVE) IMPLANT
GLOVE BIOGEL PI INDICATOR 6.5 (GLOVE) ×1
GOWN STRL REUS W/TWL LRG LVL3 (GOWN DISPOSABLE) ×2 IMPLANT
KIT TURNOVER CYSTO (KITS) ×2 IMPLANT
NDL HYPO 25X1 1.5 SAFETY (NEEDLE) ×1 IMPLANT
NEEDLE HYPO 25X1 1.5 SAFETY (NEEDLE) ×2 IMPLANT
NS IRRIG 500ML POUR BTL (IV SOLUTION) ×2 IMPLANT
PACK PERINEAL COLD (PAD) ×2 IMPLANT
PACK VAGINAL WOMENS (CUSTOM PROCEDURE TRAY) ×2 IMPLANT
SUT VIC AB 0 SH 27 (SUTURE) ×2 IMPLANT
SUT VIC AB 2-0 CT2 27 (SUTURE) IMPLANT
SUT VIC AB 2-0 SH 27 (SUTURE)
SUT VIC AB 2-0 SH 27XBRD (SUTURE) IMPLANT
SUT VIC AB 3-0 SH 27 (SUTURE) ×2
SUT VIC AB 3-0 SH 27X BRD (SUTURE) ×1 IMPLANT
SUT VICRYL 4-0 PS2 18IN ABS (SUTURE) ×4 IMPLANT
SWAB OB GYN 8IN STERILE 2PK (MISCELLANEOUS) IMPLANT
SYR BULB IRRIGATION 50ML (SYRINGE) ×2 IMPLANT
TOWEL OR 17X26 10 PK STRL BLUE (TOWEL DISPOSABLE) ×3 IMPLANT
WATER STERILE IRR 500ML POUR (IV SOLUTION) ×1 IMPLANT

## 2019-06-12 NOTE — Anesthesia Postprocedure Evaluation (Signed)
Anesthesia Post Note  Patient: [name] [name]  Procedure(s) Performed: wide local excision vulvectomy (N/A Perineum)     Patient location during evaluation: PACU Anesthesia Type: General Level of consciousness: awake and alert, patient cooperative and oriented Pain management: pain level controlled Vital Signs Assessment: post-procedure vital signs reviewed and stable Respiratory status: spontaneous breathing, nonlabored ventilation and respiratory function stable Cardiovascular status: blood pressure returned to baseline and stable Postop Assessment: no apparent nausea or vomiting Anesthetic complications: no    Last Vitals:  Vitals:   06/12/19 1345 06/12/19 1400  BP: 139/70 (!) 145/72  Pulse: (!) 56 (!) 58  Resp: 17 18  Temp:    SpO2: 95% 95%    Last Pain:  Vitals:   06/12/19 1345  TempSrc:   PainSc: 0-No pain                 Young Brim,E. Manu Rubey

## 2019-06-12 NOTE — Anesthesia Procedure Notes (Addendum)
Procedure Name: LMA Insertion Date/Time: 06/12/2019 12:11 PM Performed by: Annye Asa, MD Pre-anesthesia Checklist: Patient identified, Emergency Drugs available, Suction available and Patient being monitored Patient Re-evaluated:Patient Re-evaluated prior to induction Oxygen Delivery Method: Circle system utilized Preoxygenation: Pre-oxygenation with 100% oxygen Induction Type: IV induction Ventilation: Mask ventilation without difficulty LMA: LMA inserted LMA Size: 4.0 Number of attempts: 1 Airway Equipment and Method: Bite block Placement Confirmation: positive ETCO2 and breath sounds checked- equal and bilateral Tube secured with: Tape Dental Injury: Teeth and Oropharynx as per pre-operative assessment

## 2019-06-12 NOTE — Interval H&P Note (Signed)
History and Physical Interval Note:  06/12/2019 11:32 AM  [name] [name]  has presented today for surgery, with the diagnosis of VULVAR DYSPLASIA.  The various methods of treatment have been discussed with the patient and family. After consideration of risks, benefits and other options for treatment, the patient has consented to  Procedure(s): PARTIAL VULVECTOMY (N/A) as a surgical intervention.  The patient's history has been reviewed, patient examined, no change in status, stable for surgery.  I have reviewed the patient's chart and labs.  Questions were answered to the patient's satisfaction.     Thereasa Solo

## 2019-06-12 NOTE — Transfer of Care (Signed)
Immediate Anesthesia Transfer of Care Note  Patient: [name] [name]  Procedure(s) Performed: Procedure(s) (LRB): wide local excision vulvectomy (N/A)  Patient Location: PACU  Anesthesia Type: General  Level of Consciousness: awake, sedated, patient cooperative and responds to stimulation  Airway & Oxygen Therapy: Patient Spontanous Breathing and Patient connected to [place] O2 and soft FM  Post-op Assessment: Report given to PACU RN, Post -op Vital signs reviewed and stable and Patient moving all extremities  Post vital signs: Reviewed and stable  Complications: No apparent anesthesia complications

## 2019-06-12 NOTE — Op Note (Signed)
PATIENT: [name] [name] DATE: 06/12/19   Preop Diagnosis: VIN 3  Postoperative Diagnosis: same  Surgery: Partial simple partial simple right vulvectomy  Surgeons:  Donaciano Eva, MD Assistant: none  Anesthesia: General   Estimated blood loss: <56ml  IVF:  234ml   Urine output: 50 ml   Complications: None   Pathology: right mid labia minora with marking stitch at 12 o'clock  Operative findings: verrucous change on the mid right introitus (where labia minora had been prior to prior excision). Measured 2cm.   Procedure: The patient was identified in the preoperative holding area. Informed consent was signed on the chart. Patient was seen history was reviewed and exam was performed.   The patient was then taken to the operating room and placed in the supine position with SCD hose on. General anesthesia was then induced without difficulty. She was then placed in the dorsolithotomy position. The perineum was prepped with Betadine. The vagina was prepped with Betadine. The patient was then draped after the prep was dried. A Foley catheter was inserted into the bladder under sterile conditions for an in and out cath.  Timeout was performed the patient, procedure, antibiotic, allergy, and length of procedure. 5% acetic acid solution was applied to the perineum. The vulvar tissues were inspected for areas of acetowhite changes or leukoplakia. The lesion was identified and the marking pen was used to circumscribe the area with appropriate surgical margins. The subcuticular tissues were infiltrated with 1% lidocaine and exparel. The 15 blade scalpel was used to make an incision through the skin circumferentially as marked. The skin elipse was grasped and was separated from the underlying deep dermal tissues with the bovie device. After the specimen had been completely resected, it was oriented and marked at 12 o'clock with a 0-vicryl suture. The bovie was used to obtain hemostasis  at the surgical bed. The subcutaneous tissues were irrigated and made hemostatic.   The deep dermal layer was approximated with 3-0vicryl mattress sutures to bring the skin edges into approximation and off tension. The wound was closed following langher's lines. The cutaneous layer was closed with interrupted 4-0 vicryl stitches and mattress sutures to ensure a tension free and hemostatic closure. The perineum was again irrigated.   All instrument, suture, laparotomy, Ray-Tec, and needle counts were correct x2. The patient tolerated the procedure well and was taken recovery room in stable condition. This is [name] dictating an operative note on [name].

## 2019-06-12 NOTE — Discharge Instructions (Signed)
Post Anesthesia Home Care Instructions  Activity: Get plenty of rest for the remainder of the day. A responsible individual must stay with you for 24 hours following the procedure.  For the next 24 hours, DO NOT: -Drive a car -Paediatric nurse -Drink alcoholic beverages -Take any medication unless instructed by your physician -Make any legal decisions or sign important papers.  Meals: Start with liquid foods such as gelatin or soup. Progress to regular foods as tolerated. Avoid greasy, spicy, heavy foods. If nausea and/or vomiting occur, drink only clear liquids until the nausea and/or vomiting subsides. Call your physician if vomiting continues.  Special Instructions/Symptoms: Your throat may feel dry or sore from the anesthesia or the breathing tube placed in your throat during surgery. If this causes discomfort, gargle with warm salt water. The discomfort should disappear within 24 hours.  If you had a scopolamine patch placed behind your ear for the management of post- operative nausea and/or vomiting:  1. The medication in the patch is effective for 72 hours, after which it should be removed.  Wrap patch in a tissue and discard in the trash. Wash hands thoroughly with soap and water. 2. You may remove the patch earlier than 72 hours if you experience unpleasant side effects which may include dry mouth, dizziness or visual disturbances. 3. Avoid touching the patch. Wash your hands with soap and water after contact with the patch.    Information for Discharge Teaching: EXPAREL (bupivacaine liposome injectable suspension)   Your surgeon gave you EXPAREL(bupivacaine) in your surgical incision to help control your pain after surgery.   EXPAREL is a local anesthetic that provides pain relief by numbing the tissue around the surgical site.  EXPAREL is designed to release pain medication over time and can control pain for up to 72 hours.  Depending on how you respond to EXPAREL, you  may require less pain medication during your recovery.  Possible side effects:  Temporary loss of sensation or ability to move in the area where bupivacaine was injected.  Nausea, vomiting, constipation  Rarely, numbness and tingling in your mouth or lips, lightheadedness, or anxiety may occur.  Call your doctor right away if you think you may be experiencing any of these sensations, or if you have other questions regarding possible side effects.  Follow all other discharge instructions given to you by your surgeon or nurse. Eat a healthy diet and drink plenty of water or other fluids.  If you return to the hospital for any reason within 96 hours following the administration of EXPAREL, please inform your health care providers.Vulvectomy, Care After The vulva is the external female genitalia, outside and around the vagina and pubic bone. It consists of:  The skin on, and in front of, the pubic bone.  The clitoris.  The labia majora (large lips) on the outside of the vagina.  The labia minora (small lips) around the opening of the vagina.  The opening and the skin in and around the vagina. A vulvectomy is the removal of the tissue of the vulva, which sometimes includes removal of the lymph nodes and tissue in the groin areas. These discharge instructions provide you with general information on caring for yourself after you leave the hospital. It is also important that you know the warning signs of complications, so that you can seek treatment. Please read the instructions outlined below and refer to this sheet in the next few weeks. Your caregiver may also give you specific information and medicines. If  you have any questions or complications after discharge, please call your caregiver. ACTIVITY  Rest as much as possible the first two weeks after discharge.  Arrange to have help from family or others with your daily activities when you go home.  Avoid heavy lifting (more than 5  pounds), pushing, or pulling.  If you feel tired, balance your activity with rest periods.  Follow your caregiver's instruction about climbing stairs and driving a car.  Increase activity gradually.  Do not exercise until you have permission from your caregiver. LEG AND FOOT CARE If your doctor has removed lymph nodes from your groin area, there may be an increase in swelling of your legs and feet. You can help prevent swelling by doing the following:  Elevate your legs while sitting or lying down.  If your caregiver has ordered special stockings, wear them according to instructions.  Avoid standing in one place for long periods of time.  Call the physical therapy department if you have any questions about swelling or treatment for swelling.  Avoid salt in your diet. It can cause fluid retention and swelling.  Do not cross your legs, especially when sitting.  Medications:  - Take ibuprofen and tylenol first line for pain control. Take these regularly (every 6 hours) to decrease the build up of pain.  - If necessary, for severe pain not relieved by ibuprofen, take percocet.  - While taking percocet you should take sennakot every night to reduce the likelihood of constipation. If this causes diarrhea, stop its use.  - stop using the lovenox shots today. Take no more lovenox  - you can begin your xarelto tomorrow, the day after surgery (June 26th, 2020).   NUTRITION  You may resume your normal diet.  Drink 6 to 8 glasses of fluids a day.  Eat a healthy, balanced diet including portions of food from the meat (protein), milk, fruit, vegetable, and bread groups.  Your caregiver may recommend you take a multivitamin with iron. ELIMINATION  You may notice that your stream of urine is at a different angle, and may tend to spray. Using a plastic funnel may help to decrease urine spray.  If constipation occurs, drink more liquids, and add more fruits, vegetables, and bran to your  diet. You may take a mild laxative, such as Milk of Magnesia, Metamucil, or a stool softener such as Colace, with permission from your caregiver.  HYGIENE  You may shower and wash your hair.  Check with your caregiver about tub baths.  Do not add any bath oils or chemicals to your bath water, after you have permission to take baths.  While passing urine, pour water from a bottle or spray over your vulva to dilute the urine as it passes the incision (this will decrease burning and discomfort).  Clean yourself well after moving your bowels.  After urinating, do not wipe. Dap or pat dry with toilet paper or a dry cleath soft cloth.  A sitz bath will help keep your perineal area clean, reduce swelling, and provide comfort.  Avoid wearing underpants for the first 2 weeks and wear loose skirts to allow circulation of air around the incision  You do not need to apply dressings, salves or lotions to the wound.  The stitches are self-dissolving and will absorb and disappear over a couple of months (it is normal to notice the knot from the stitches on toilet paper after voiding). HOME CARE INSTRUCTIONS   Apply a soft ice pack (or frozen  bag of peas) to your perineum (vulva) every hour in the first 48 hours after surgery. This will reduce swelling.  Avoid activities that involve a lot of friction between your legs.  Avoid wearing pants or underpants in the 1st 2 weeks (skirts are preferable).  Take your temperature twice a day and record it, especially if you feel feverish or have chills.  Follow your caregiver's instructions about medicines, activity, and follow-up appointments after surgery.  Do not drink alcohol while taking pain medicine.  Change your dressing as advised by your caregiver.  You may take over-the-counter medicine for pain, recommended by your caregiver.  If your pain is not relieved with medicine, call your caregiver.  Do not take aspirin because it can cause  bleeding.  Do not douche or use tampons (use a nonperfumed sanitary pad).  Do not have sexual intercourse until your caregiver gives you permission (typically 6 weeks postoperatively). Hugging, kissing, and playful sexual activity is fine with your caregiver's permission.  Warm sitz baths, with your caregiver's permission, are helpful to control swelling and discomfort.  Take showers instead of baths, until your caregiver gives you permission to take baths.  You may take a mild medicine for constipation, recommended by your caregiver. Bran foods and drinking a lot of fluids will help with constipation.  Make sure your family understands everything about your operation and recovery. SEEK MEDICAL CARE IF:   You notice swelling and redness around the wound area.  You notice a foul smell coming from the wound or on the surgical dressing.  You notice the wound is separating.  You have painful or bloody urination.  You develop nausea and vomiting.  You develop diarrhea.  You develop a rash.  You have a reaction or allergy from the medicine.  You feel dizzy or light-headed.  You need stronger pain medicine. SEEK IMMEDIATE MEDICAL CARE IF:   You develop a temperature of 102 F (38.9 C) or higher.  You pass out.  You develop leg or chest pain.  You develop abdominal pain.  You develop shortness of breath.  You develop bleeding from the wound area.  You see pus in the wound area. MAKE SURE YOU:   Understand these instructions.  Will watch your condition.  Will get help right away if you are not doing well or get worse. Document Released: 07/18/2004 Document Revised: 04/20/2014 Document Reviewed: 11/05/2009 [facility] Patient Information 2015 [place], [place]. This information is not intended to replace advice given to you by your health care provider. Make sure you discuss any questions you have with your health care provider.

## 2019-06-13 ENCOUNTER — Telehealth: Payer: Self-pay

## 2019-06-13 NOTE — Telephone Encounter (Signed)
Ms Borgen states that she is doing very well after surgery. She has only needed 2 pain pills so far. She is eating and drinking well. She has been up and around the house. Urinating well. She will call the office if she has any questions or concerns.

## 2019-06-16 ENCOUNTER — Encounter (HOSPITAL_BASED_OUTPATIENT_CLINIC_OR_DEPARTMENT_OTHER): Payer: Self-pay | Admitting: Gynecologic Oncology

## 2019-06-24 ENCOUNTER — Ambulatory Visit (INDEPENDENT_AMBULATORY_CARE_PROVIDER_SITE_OTHER): Payer: Medicare Other | Admitting: *Deleted

## 2019-06-24 DIAGNOSIS — I442 Atrioventricular block, complete: Secondary | ICD-10-CM

## 2019-06-25 LAB — CUP PACEART REMOTE DEVICE CHECK
Battery Remaining Longevity: 115 mo
Battery Voltage: 3.06 V
Brady Statistic AP VP Percent: 0.42 %
Brady Statistic AP VS Percent: 0.01 %
Brady Statistic AS VP Percent: 98.61 %
Brady Statistic AS VS Percent: 0.96 %
Brady Statistic RA Percent Paced: 0.48 %
Brady Statistic RV Percent Paced: 91.8 %
Date Time Interrogation Session: 20200708054317
Implantable Lead Implant Date: 20090818
Implantable Lead Implant Date: 20090818
Implantable Lead Implant Date: 20200103
Implantable Lead Location: 753858
Implantable Lead Location: 753859
Implantable Lead Location: 753860
Implantable Lead Model: 4598
Implantable Lead Model: 5076
Implantable Lead Model: 5076
Implantable Pulse Generator Implant Date: 20200103
Lead Channel Impedance Value: 361 Ohm
Lead Channel Impedance Value: 361 Ohm
Lead Channel Impedance Value: 399 Ohm
Lead Channel Impedance Value: 418 Ohm
Lead Channel Impedance Value: 437 Ohm
Lead Channel Impedance Value: 437 Ohm
Lead Channel Impedance Value: 456 Ohm
Lead Channel Impedance Value: 475 Ohm
Lead Channel Impedance Value: 475 Ohm
Lead Channel Impedance Value: 627 Ohm
Lead Channel Impedance Value: 627 Ohm
Lead Channel Impedance Value: 665 Ohm
Lead Channel Impedance Value: 703 Ohm
Lead Channel Impedance Value: 703 Ohm
Lead Channel Pacing Threshold Amplitude: 0.5 V
Lead Channel Pacing Threshold Amplitude: 0.875 V
Lead Channel Pacing Threshold Amplitude: 1.375 V
Lead Channel Pacing Threshold Pulse Width: 0.4 ms
Lead Channel Pacing Threshold Pulse Width: 0.4 ms
Lead Channel Pacing Threshold Pulse Width: 0.4 ms
Lead Channel Sensing Intrinsic Amplitude: 1.75 mV
Lead Channel Sensing Intrinsic Amplitude: 1.75 mV
Lead Channel Sensing Intrinsic Amplitude: 5.75 mV
Lead Channel Sensing Intrinsic Amplitude: 5.75 mV
Lead Channel Setting Pacing Amplitude: 1.5 V
Lead Channel Setting Pacing Amplitude: 2 V
Lead Channel Setting Pacing Amplitude: 2.5 V
Lead Channel Setting Pacing Pulse Width: 0.4 ms
Lead Channel Setting Pacing Pulse Width: 0.4 ms
Lead Channel Setting Sensing Sensitivity: 1.2 mV

## 2019-06-26 ENCOUNTER — Other Ambulatory Visit: Payer: Self-pay | Admitting: Gynecologic Oncology

## 2019-06-26 MED ORDER — SENNA 8.6 MG PO TABS: 1.0000 | ORAL_TABLET | Freq: Every day | ORAL | 1 refills | Status: AC

## 2019-07-05 ENCOUNTER — Encounter: Payer: Self-pay | Admitting: Cardiology

## 2019-07-05 NOTE — Progress Notes (Signed)
Remote pacemaker transmission.   

## 2019-07-09 ENCOUNTER — Other Ambulatory Visit: Payer: Self-pay

## 2019-07-09 ENCOUNTER — Encounter: Payer: Self-pay | Admitting: Gynecologic Oncology

## 2019-07-09 ENCOUNTER — Inpatient Hospital Stay: Payer: Medicare Other | Attending: Gynecologic Oncology | Admitting: Gynecologic Oncology

## 2019-07-09 VITALS — BP 153/62 | HR 89 | Temp 98.4°F | Resp 18 | Ht 62.5 in | Wt 213.8 lb

## 2019-07-09 DIAGNOSIS — D869 Sarcoidosis, unspecified: Secondary | ICD-10-CM | POA: Diagnosis not present

## 2019-07-09 DIAGNOSIS — I2510 Atherosclerotic heart disease of native coronary artery without angina pectoris: Secondary | ICD-10-CM | POA: Insufficient documentation

## 2019-07-09 DIAGNOSIS — Z86711 Personal history of pulmonary embolism: Secondary | ICD-10-CM | POA: Insufficient documentation

## 2019-07-09 DIAGNOSIS — Z950 Presence of cardiac pacemaker: Secondary | ICD-10-CM | POA: Insufficient documentation

## 2019-07-09 DIAGNOSIS — Z8673 Personal history of transient ischemic attack (TIA), and cerebral infarction without residual deficits: Secondary | ICD-10-CM | POA: Insufficient documentation

## 2019-07-09 DIAGNOSIS — I110 Hypertensive heart disease with heart failure: Secondary | ICD-10-CM | POA: Insufficient documentation

## 2019-07-09 DIAGNOSIS — I872 Venous insufficiency (chronic) (peripheral): Secondary | ICD-10-CM | POA: Insufficient documentation

## 2019-07-09 DIAGNOSIS — Z86718 Personal history of other venous thrombosis and embolism: Secondary | ICD-10-CM | POA: Diagnosis not present

## 2019-07-09 DIAGNOSIS — M1990 Unspecified osteoarthritis, unspecified site: Secondary | ICD-10-CM | POA: Diagnosis not present

## 2019-07-09 DIAGNOSIS — I480 Paroxysmal atrial fibrillation: Secondary | ICD-10-CM | POA: Diagnosis not present

## 2019-07-09 DIAGNOSIS — I252 Old myocardial infarction: Secondary | ICD-10-CM | POA: Diagnosis not present

## 2019-07-09 DIAGNOSIS — Z7901 Long term (current) use of anticoagulants: Secondary | ICD-10-CM | POA: Insufficient documentation

## 2019-07-09 DIAGNOSIS — E785 Hyperlipidemia, unspecified: Secondary | ICD-10-CM | POA: Diagnosis not present

## 2019-07-09 DIAGNOSIS — I272 Pulmonary hypertension, unspecified: Secondary | ICD-10-CM | POA: Diagnosis not present

## 2019-07-09 DIAGNOSIS — Z79899 Other long term (current) drug therapy: Secondary | ICD-10-CM | POA: Diagnosis not present

## 2019-07-09 DIAGNOSIS — C519 Malignant neoplasm of vulva, unspecified: Secondary | ICD-10-CM | POA: Insufficient documentation

## 2019-07-09 DIAGNOSIS — I5042 Chronic combined systolic (congestive) and diastolic (congestive) heart failure: Secondary | ICD-10-CM | POA: Diagnosis not present

## 2019-07-09 DIAGNOSIS — K219 Gastro-esophageal reflux disease without esophagitis: Secondary | ICD-10-CM | POA: Diagnosis not present

## 2019-07-09 DIAGNOSIS — Z7189 Other specified counseling: Secondary | ICD-10-CM

## 2019-07-09 DIAGNOSIS — Z9079 Acquired absence of other genital organ(s): Secondary | ICD-10-CM | POA: Diagnosis not present

## 2019-07-09 NOTE — Progress Notes (Signed)
Follow-up Note: Gyn-Onc   [name] [name] 83 y.o. female  Chief Complaint  Patient presents with  . 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   Margins were close.  There was minimal (74mm) of soft tissue invasion. And close (14mm deep margin, 79mm peripheral).  Given her cormoribidities I am recommending close observation rather than re-excision or radiation.   She understands that there is a high probability of recurrence which may require either surgery or radiation at that time.  HPI: February 19, 2015 the patient underwent a partial simple vulvectomy for VIN 3.  Final pathology showed VIN 3 with a focal positive vaginal margin. She recently saw Dr. Benjie Karvonen who obtained a Pap smear showing ASCUS with high risk HPV in 2019.  In addition a vulvar lesion was seen and biopsied returning VIN 3. Colposcopy with Dr Fermin Schwab in September, 2019 of the vagina did not show high grade lesions.  She underwent wide local excision of the vulva on 10/01/18 for VIN 3. Pathology showed VIN 3 with positive margins at 3 and 9 o'clock focally. No macroscopic residual disease.  She was scheduled for follow-up in March, 2020 however this was postponed due to the COVID-10 pandemic. At that time she began experiencing right vulva pain and began to feel a mass.  It was biopsied in the office on 05/21/19 and this showed VIN3.   Interval History:  On June 12, 2019 she underwent partial vulvectomy on the right.  Final pathology showed a squamous cell carcinoma with 1 mm of invasion.  No LVSI was identified.  It was a well differentiated lesion.  The deep margin was 1 mm and the peripheral margin was 2 mm.  It was arising in the background of VIN 3.  Review of Systems:10 point review of systems is negative except as noted in interval history.   Vitals: Blood pressure (!) 153/62, pulse 89, temperature 98.4 F (36.9 C), temperature source Oral,  resp. rate 18, height 5' 2.5" (1.588 m), weight 213 lb 12.8 oz (97 kg), SpO2 97 %.  Physical Exam: General : The patient is a healthy woman in no acute distress.  HEENT: normocephalic, extraoccular movements normal; neck is supple without thyromegally  Lynphnodes: Supraclavicular and inguinal nodes not enlarged  Abdomen: Soft, non-tender, no ascites, no organomegally, no masses, no hernias  Pelvic:  EGBUS: At the introitus (mid way) on the right there is a well healed incision parallel to the introitus. Not fixed to underlying structures. Vagina: Normal, no lesions.   Urethra and Bladder: Normal, non-tender  Cervix: Surgically absent  Uterus: Surgically absent  Bi-manual examination: Non-tender; no adenxal masses or nodularity  Rectal: normal sphincter tone, no masses, no blood  Lower extremities: No edema or varicosities. Normal range of motion      Allergies  Allergen Reactions  . Cephalexin Diarrhea  . Imitrex [Sumatriptan] Other (See Comments)    Reaction unknown  . Sulfa Antibiotics Nausea Only    Doesn't like to drink lots of water.    Past Medical History:  Diagnosis Date  . Allergic rhinitis   . Anxiety   . Aortic atherosclerosis ([place])   . Cardiac pacemaker in situ    DOI  08-04-2008  medtronic dual chamber  . Chronic anticoagulation   . Chronic combined systolic and diastolic CHF, NYHA class 2 ([place])    montior by cardiologist-- dr Lovena Le--  Leory Plowman  45%  . Coronary artery disease CARDIOLOGIST-- DR Cristopher Peru   per cath-- Non-obstructive minimal CAD  . Fluid retention   . GERD (gastroesophageal reflux disease)   . Hemorrhoids   . Hiatal hernia   . History of cervical dysplasia    CIN III  . History of CVA (cerebrovascular accident)    09-15-2005  Left Cerebellar Hematoma with mild stem compression whild on coumadin for DVT  . History of DVT of lower extremity   . History of non-ST elevation myocardial infarction (NSTEMI)    2011-- in setting of bilateral PE's  and DVT's and CHF  . History of peptic ulcer disease   . History of pulmonary edema 07/25/2010  . History of pulmonary embolus (PE)   . Hyperlipidemia   . Hypertension   . Incontinence of urine   . LBBB (left bundle branch block)   . LVH (left ventricular hypertrophy) 07/16/2018   Moderate, noted on ECHO  . Mild obstructive sleep apnea    PT USES Humidied Nasal Mask with O2 at2.5L (study 08-05-2009)  . Mild pulmonary hypertension ([place])   . Moderate intermittent asthma   . Nocturnal leg cramps   . OA (osteoarthritis)   . PAF (paroxysmal atrial fibrillation) ([place])   . Peripheral edema   . Renal insufficiency   . Sarcoidosis   . Shortness of breath    OCCASIONAL  . Sigmoid diverticulosis    Mild  . Stokes-Adams syncope    asymptomatic  . Thrombophilia ([place])    hemotologist--  dr Marin Olp--  monitors pt anitcoagulation  . Urgency of urination   . Venous insufficiency   . VIN III (vulvar intraepithelial neoplasia III)   . Wears glasses    reading    Past Surgical History:  Procedure Laterality Date  . ABDOMINAL HYSTERECTOMY  1970's  . APPENDECTOMY  1966  . BIV UPGRADE  12/20/2018  . BIV UPGRADE N/A 12/20/2018   Procedure: BIV PPM UPGRADE;  Surgeon: Evans Lance, MD;  Location: [facility];  Service: Cardiovascular;  Laterality: N/A;  . CARDIAC CATHETERIZATION  08-26-2003   dr Lorenz Coaster. non-obstructive CAD & LVD with ef 46%, global HK, 2+MR, PA press 50/24 w/ wedge 20  . CARDIAC CATHETERIZATION  08-03-2008   dr Angelena Form   Non-obstructive CAD/  30-40% pLAD,  30% ostial CFX/  symptomatic bradycardia/  normal renal arteries  . CARDIAC CATHETERIZATION N/A 07/21/2016   Procedure: Right Heart Cath;  Surgeon: Larey Dresser, MD;  Location: [facility];  Service: Cardiovascular;  Laterality: N/A;  . CARDIAC PACEMAKER PLACEMENT  08-04-2008  dr Lovena Le gregg   for CHB--  MEDTRONIC DUAL CHAMBER  . CATARACTS REMOVED    . CHOLECYSTECTOMY  01/1996  . DECCOMPRESSION  ULNAR NERVE AT CUBITAL TUNNEL AND RESECTION MUSCLE Left 03-17-2008  . EXCISION LEFT SUPRACLAVICULAR LYMPH NODE   04-22-2007   granulomatous inflammation, extensive  . INSERTION OF VENA CAVA FILTER  08/ 2011  . KNEE ARTHROSCOPY     LEFT  . LUMBAR [place] SURGERY  x2   1990  . SHOULDER ARTHROSCOPY WITH ROTATOR CUFF REPAIR AND SUBACROMIAL DECOMPRESSION Right 02-08-2009  . TOTAL KNEE ARTHROPLASTY Left 04/21/2013   Procedure: LEFT TOTAL KNEE ARTHROPLASTY;  Surgeon: Gearlean Alf, MD;  Location: [facility];  Service: Orthopedics;  Laterality: Left;  . TOTAL KNEE ARTHROPLASTY Right 04/03/2016   Procedure: RIGHT TOTAL KNEE ARTHROPLASTY;  Surgeon: Gaynelle Arabian, MD;  Location: [facility];  Service: Orthopedics;  Laterality: Right;  .  TRANSTHORACIC ECHOCARDIOGRAM  last one 05-19-2014   mild LVH/  ef 45-50%/  septal-lateral dyssynchroncy consistant with LBBB/  diffuse hypokinesis/  grade I diastolic dysfunction/  trivial MR and TR/  mild LAE  . TUBAL LIGATION    . VULVAR LESION REMOVAL  07/16/2012   Procedure: VULVAR LESION;  Surgeon: Imagene Gurney A. Alycia Rossetti, MD;  Location: [facility];  Service: Gynecology;  Laterality: Right;  Wide Local Excision of the Vulvar  . VULVECTOMY N/A 02/19/2015   Procedure: SIMPLE PARTIAL VULVECTOMY;  Surgeon: [name], MD;  Location[facility];  Service: Gynecology;  Laterality: N/A;  . VULVECTOMY N/A 06/12/2019   Procedure: wide local excision vulvectomy;  Surgeon: [name], MD;  Location[facility];  Service: Gynecology;  Laterality: N/A;  . VULVECTOMY PARTIAL N/A 10/01/2018   Procedure: VULVECTOMY PARTIAL;  Surgeon: [name], MD;  Location[facility];  Service: Gynecology;  Laterality: N/A;    Current Outpatient Medications  Medication Sig Dispense Refill  . acetaminophen (TYLENOL) 325 MG tablet Take 1-2 tablets (325-650 mg total) by mouth every 4 (four) hours as needed for mild pain.    . Azelastine HCl 0.15 % SOLN USE 1 TO 2 SPRAYS IN  EACH  NOSTRIL DAILY (Patient taking differently: Place 1-2 sprays into both nostrils daily as needed (congestion). ) 30 mL 5  . budesonide-formoterol (SYMBICORT) 160-4.5 MCG/ACT inhaler Inhale 2 puffs into the lungs 2 (two) times daily.    . Calcium Carbonate-Vit D-Min (CALCIUM 1200) 1200-1000 MG-UNIT CHEW Chew 1 tablet by mouth daily.    . cetirizine (ZYRTEC) 10 MG tablet Take 10 mg by mouth every morning.     . clorazepate (TRANXENE) 7.5 MG tablet Take 1 tablet (7.5 mg total) by mouth 3 (three) times daily. 90 tablet 5  . furosemide (LASIX) 40 MG tablet TAKE 1 TABLET BY MOUTH  TWICE A DAY 180 tablet 3  . gabapentin (NEURONTIN) 100 MG capsule 3 CAPSULES AT BEDTIME. 270 capsule 2  . lidocaine (XYLOCAINE) 5 % ointment Apply to right thigh twice daily as needed for pain. 50 g 5  . losartan (COZAAR) 25 MG tablet Take 25 mg by mouth daily.     . magic mouthwash SOLN Swish and swallow 5 ml 4 times daily if needed (Patient taking differently: Take 5 mLs by mouth 4 (four) times daily as needed for mouth pain (sore throat). ) 200 mL 2  . metoprolol succinate (TOPROL-XL) 25 MG 24 hr tablet Take 1 tablet by mouth  daily before breakfast (Patient taking differently: Take 25 mg by mouth daily. ) 90 tablet 3  . montelukast (SINGULAIR) 10 MG tablet Take 10 mg by mouth daily.     . Multiple Vitamins-Minerals (PROTEGRA) CAPS Take 1 capsule by mouth daily.    [name] Kitchen oxyCODONE-acetaminophen (PERCOCET/ROXICET) 5-325 MG tablet Take 1 tablet by mouth every 4 (four) hours as needed for severe pain. 30 tablet 0  . pantoprazole (PROTONIX) 40 MG tablet Take 1 tablet by mouth  twice a day 180 tablet 3  . Polyethyl Glycol-Propyl Glycol (SYSTANE) 0.4-0.3 % SOLN Place 2 drops into both eyes 4 (four) times daily as needed (dryness).     . potassium chloride (K-DUR,KLOR-CON) 10 MEQ tablet TAKE 2 TABLETS BY MOUTH  DAILY (Patient taking differently: Take 10 mEq by mouth 2 (two) times daily. ) 180 tablet 3  . pravastatin (PRAVACHOL) 20  MG tablet Take 20 mg by mouth at bedtime.     . rivaroxaban (XARELTO) 20  MG TABS tablet Take 1 tablet (20 mg total) by mouth daily. 90 tablet 6  . senna (SENOKOT) 8.6 MG TABS tablet Take 1 tablet (8.6 mg total) by mouth at bedtime. 120 tablet 0  . senna (SENOKOT) 8.6 MG TABS tablet Take 1 tablet (8.6 mg total) by mouth at bedtime. 30 tablet 1   No current facility-administered medications for this visit.     Social History   Socioeconomic History  . Marital status: Widowed    Spouse name: Not on file  . Number of children: 4  . Years of education: Not on file  . Highest education level: Some college, no degree  Occupational History  . Occupation: retired  Scientific laboratory technician  . Financial resource strain: Not on file  . Food insecurity    Worry: Not on file    Inability: Not on file  . Transportation needs    Medical: Not on file    Non-medical: Not on file  Tobacco Use  . Smoking status: Never Smoker  . Smokeless tobacco: Never Used  . Tobacco comment: NEVER USED TOBACCO  Substance and Sexual Activity  . Alcohol use: No    Alcohol/week: 0.0 standard drinks  . Drug use: No  . Sexual activity: Not Currently  Lifestyle  . Physical activity    Days per week: Not on file    Minutes per session: Not on file  . Stress: Not on file  Relationships  . Social Herbalist on phone: Not on file    Gets together: Not on file    Attends religious service: Not on file    Active member of club or organization: Not on file    Attends meetings of clubs or organizations: Not on file    Relationship status: Not on file  . Intimate partner violence    Fear of current or ex partner: Not on file    Emotionally abused: Not on file    Physically abused: Not on file    Forced sexual activity: Not on file  Other Topics Concern  . Not on file  Social History Narrative   Lives alone in an independent living facility.  Has 4 children.  Retired Optometrist for [employer]  Education: some college.      Family History  Problem Relation Age of Onset  . Heart attack Father   . Breast cancer Sister        mets  . Heart attack Brother   . Kidney failure Sister   . Colon cancer Neg Hx   . Esophageal cancer Neg Hx   . Pancreatic cancer Neg Hx   . Liver disease Neg Hx      20 minutes of direct face to face counseling time was spent with the patient. This included discussion about prognosis, therapy recommendations and postoperative side effects and are beyond the scope of routine postoperative care.  Thereasa Solo, MD 07/09/2019, 2:55 PM

## 2019-07-09 NOTE — Patient Instructions (Signed)
Dr Denman George removed a very small (stage IA) vulvar cancer from the right vulva. It is healing well. She recommends close observation every 3 months as the chance for recurrence is high. You may require additional surgery in this area, or radiation, if the cancer returns.   Please call [phone] if you have questions or if you desire referral to a gynecologic oncologist in [place].

## 2019-07-10 ENCOUNTER — Telehealth: Payer: Self-pay | Admitting: *Deleted

## 2019-07-10 NOTE — Telephone Encounter (Signed)
Patient called and asked "is it ok for me to go back to my wellness program? They check my weight and blood pressure. Also can I do back to doing my chair exercises?" Explained that I would ask Dr. Denman George or Lenna Sciara APP and get back with her.

## 2019-07-11 NOTE — Telephone Encounter (Signed)
Told Ms Ramey that she can return to wellness program and chair exercises per [name], NP.

## 2019-07-14 ENCOUNTER — Telehealth: Payer: Self-pay | Admitting: Internal Medicine

## 2019-07-14 NOTE — Telephone Encounter (Signed)
Spoke with patient. She will be going to [place] for a week for her grandson's wedding. Advised she can leave her Carelink monitor at home. Pt verbalizes understanding and denies additional questions at this time.

## 2019-07-14 NOTE — Telephone Encounter (Signed)
New message:   Patient calling concering her pacemaker patient is going to see what she need to do when she go on vacation. Patient will not be home from 1 to 2 pm today.

## 2019-07-23 ENCOUNTER — Other Ambulatory Visit: Payer: Self-pay

## 2019-07-23 ENCOUNTER — Ambulatory Visit (INDEPENDENT_AMBULATORY_CARE_PROVIDER_SITE_OTHER): Payer: Medicare Other | Admitting: Internal Medicine

## 2019-07-23 ENCOUNTER — Encounter: Payer: Self-pay | Admitting: Internal Medicine

## 2019-07-23 VITALS — BP 140/82 | HR 91 | Ht 62.5 in | Wt 214.0 lb

## 2019-07-23 DIAGNOSIS — I442 Atrioventricular block, complete: Secondary | ICD-10-CM | POA: Diagnosis not present

## 2019-07-23 DIAGNOSIS — Z950 Presence of cardiac pacemaker: Secondary | ICD-10-CM | POA: Diagnosis not present

## 2019-07-23 DIAGNOSIS — I5022 Chronic systolic (congestive) heart failure: Secondary | ICD-10-CM

## 2019-07-23 NOTE — Patient Instructions (Addendum)
Medication Instructions:  Your physician recommends that you continue on your current medications as directed. Please refer to the Current Medication list given to you today.  1.  INCREASE your LASIX 40 mg for 3 days--Take 2 tablets by mouth TWICE A DAY for days then return to your normal dosage.  Labwork: None ordered.  Testing/Procedures: None ordered.  Follow-Up: No follow up needed.  Pt moving.  Referral placed for Pt to follow up with EP.  Remote monitoring is used to monitor your Pacemaker from home. This monitoring reduces the number of office visits required to check your device to one time per year. It allows [place] to keep an eye on the functioning of your device to ensure it is working properly. You are scheduled for a device check from home on 09/23/2019. You may send your transmission at any time that day. If you have a wireless device, the transmission will be sent automatically. After your physician reviews your transmission, you will receive a postcard with your next transmission date.  Any Other Special Instructions Will Be Listed Below (If Applicable).  If you need a refill on your cardiac medications before your next appointment, please call your pharmacy.

## 2019-07-23 NOTE — Progress Notes (Signed)
HPI [name] [name] returns today for followup. She has CHB, s/p PPM insertion, with worsening CHF who underwent upgrade to a biv PPM several months ago. She has done well in the interim with no chest pain, or sob. She does have peripheral edema. No syncope.  Allergies  Allergen Reactions  . Cephalexin Diarrhea  . Imitrex [Sumatriptan] Other (See Comments)    Reaction unknown  . Sulfa Antibiotics Nausea Only    Doesn't like to drink lots of water.     Current Outpatient Medications  Medication Sig Dispense Refill  . acetaminophen (TYLENOL) 325 MG tablet Take 1-2 tablets (325-650 mg total) by mouth every 4 (four) hours as needed for mild pain.    . Azelastine HCl 0.15 % SOLN USE 1 TO 2 SPRAYS IN EACH  NOSTRIL DAILY (Patient taking differently: Place 1-2 sprays into both nostrils daily as needed (congestion). ) 30 mL 5  . budesonide-formoterol (SYMBICORT) 160-4.5 MCG/ACT inhaler Inhale 2 puffs into the lungs 2 (two) times daily.    . Calcium Carbonate-Vit D-Min (CALCIUM 1200) 1200-1000 MG-UNIT CHEW Chew 1 tablet by mouth daily.    . cetirizine (ZYRTEC) 10 MG tablet Take 10 mg by mouth every morning.     . clorazepate (TRANXENE) 7.5 MG tablet Take 1 tablet (7.5 mg total) by mouth 3 (three) times daily. 90 tablet 5  . furosemide (LASIX) 40 MG tablet TAKE 1 TABLET BY MOUTH  TWICE A DAY 180 tablet 3  . gabapentin (NEURONTIN) 100 MG capsule 3 CAPSULES AT BEDTIME. 270 capsule 2  . lidocaine (XYLOCAINE) 5 % ointment Apply to right thigh twice daily as needed for pain. 50 g 5  . losartan (COZAAR) 25 MG tablet Take 25 mg by mouth daily.     . magic mouthwash SOLN Swish and swallow 5 ml 4 times daily if needed (Patient taking differently: Take 5 mLs by mouth 4 (four) times daily as needed for mouth pain (sore throat). ) 200 mL 2  . metoprolol succinate (TOPROL-XL) 25 MG 24 hr tablet Take 1 tablet by mouth  daily before breakfast (Patient taking differently: Take 25 mg by mouth daily. ) 90 tablet 3   . montelukast (SINGULAIR) 10 MG tablet Take 10 mg by mouth daily.     . Multiple Vitamins-Minerals (PROTEGRA) CAPS Take 1 capsule by mouth daily.    [name] Kitchen oxyCODONE-acetaminophen (PERCOCET/ROXICET) 5-325 MG tablet Take 1 tablet by mouth every 4 (four) hours as needed for severe pain. 30 tablet 0  . pantoprazole (PROTONIX) 40 MG tablet Take 1 tablet by mouth  twice a day 180 tablet 3  . Polyethyl Glycol-Propyl Glycol (SYSTANE) 0.4-0.3 % SOLN Place 2 drops into both eyes 4 (four) times daily as needed (dryness).     . potassium chloride (K-DUR,KLOR-CON) 10 MEQ tablet TAKE 2 TABLETS BY MOUTH  DAILY (Patient taking differently: Take 10 mEq by mouth 2 (two) times daily. ) 180 tablet 3  . pravastatin (PRAVACHOL) 20 MG tablet Take 20 mg by mouth at bedtime.     . rivaroxaban (XARELTO) 20 MG TABS tablet Take 1 tablet (20 mg total) by mouth daily. 90 tablet 6  . senna (SENOKOT) 8.6 MG TABS tablet Take 1 tablet (8.6 mg total) by mouth at bedtime. 120 tablet 0  . senna (SENOKOT) 8.6 MG TABS tablet Take 1 tablet (8.6 mg total) by mouth at bedtime. 30 tablet 1   No current facility-administered medications for this visit.      Past Medical History:  Diagnosis Date  . Allergic rhinitis   . Anxiety   . Aortic atherosclerosis ([place])   . Cardiac pacemaker in situ    DOI  08-04-2008  medtronic dual chamber  . Chronic anticoagulation   . Chronic combined systolic and diastolic CHF, NYHA class 2 ([place])    montior by cardiologist-- dr Yashua Bracco--  ef 45%  . Coronary artery disease CARDIOLOGIST-- DR Cristopher Peru   per cath-- Non-obstructive minimal CAD  . Fluid retention   . GERD (gastroesophageal reflux disease)   . Hemorrhoids   . Hiatal hernia   . History of cervical dysplasia    CIN III  . History of CVA (cerebrovascular accident)    09-15-2005  Left Cerebellar Hematoma with mild stem compression whild on coumadin for DVT  . History of DVT of lower extremity   . History of non-ST elevation myocardial  infarction (NSTEMI)    2011-- in setting of bilateral PE's and DVT's and CHF  . History of peptic ulcer disease   . History of pulmonary edema 07/25/2010  . History of pulmonary embolus (PE)   . Hyperlipidemia   . Hypertension   . Incontinence of urine   . LBBB (left bundle branch block)   . LVH (left ventricular hypertrophy) 07/16/2018   Moderate, noted on ECHO  . Mild obstructive sleep apnea    PT USES Humidied Nasal Mask with O2 at2.5L (study 08-05-2009)  . Mild pulmonary hypertension ([place])   . Moderate intermittent asthma   . Nocturnal leg cramps   . OA (osteoarthritis)   . PAF (paroxysmal atrial fibrillation) ([place])   . Peripheral edema   . Renal insufficiency   . Sarcoidosis   . Shortness of breath    OCCASIONAL  . Sigmoid diverticulosis    Mild  . Stokes-Adams syncope    asymptomatic  . Thrombophilia ([place])    hemotologist--  dr Marin Olp--  monitors pt anitcoagulation  . Urgency of urination   . Venous insufficiency   . VIN III (vulvar intraepithelial neoplasia III)   . Wears glasses    reading    ROS:   All systems reviewed and negative except as noted in the HPI.   Past Surgical History:  Procedure Laterality Date  . ABDOMINAL HYSTERECTOMY  1970's  . APPENDECTOMY  1966  . BIV UPGRADE  12/20/2018  . BIV UPGRADE N/A 12/20/2018   Procedure: BIV PPM UPGRADE;  Surgeon: Evans Lance, MD;  Location: [facility];  Service: Cardiovascular;  Laterality: N/A;  . CARDIAC CATHETERIZATION  08-26-2003   dr Lorenz Coaster. non-obstructive CAD & LVD with ef 46%, global HK, 2+MR, PA press 50/24 w/ wedge 20  . CARDIAC CATHETERIZATION  08-03-2008   dr Angelena Form   Non-obstructive CAD/  30-40% pLAD,  30% ostial CFX/  symptomatic bradycardia/  normal renal arteries  . CARDIAC CATHETERIZATION N/A 07/21/2016   Procedure: Right Heart Cath;  Surgeon: Larey Dresser, MD;  Location: [facility];  Service: Cardiovascular;  Laterality: N/A;  . CARDIAC PACEMAKER PLACEMENT   08-04-2008  dr Lovena Le Borden Thune   for CHB--  MEDTRONIC DUAL CHAMBER  . CATARACTS REMOVED    . CHOLECYSTECTOMY  01/1996  . DECCOMPRESSION ULNAR NERVE AT CUBITAL TUNNEL AND RESECTION MUSCLE Left 03-17-2008  . EXCISION LEFT SUPRACLAVICULAR LYMPH NODE   04-22-2007   granulomatous inflammation, extensive  . INSERTION OF VENA CAVA FILTER  08/ 2011  . KNEE ARTHROSCOPY     LEFT  . LUMBAR DISC SURGERY  x2   1990  . SHOULDER ARTHROSCOPY WITH ROTATOR CUFF REPAIR AND SUBACROMIAL DECOMPRESSION Right 02-08-2009  . TOTAL KNEE ARTHROPLASTY Left 04/21/2013   Procedure: LEFT TOTAL KNEE ARTHROPLASTY;  Surgeon: Gearlean Alf, MD;  Location: [facility];  Service: Orthopedics;  Laterality: Left;  . TOTAL KNEE ARTHROPLASTY Right 04/03/2016   Procedure: RIGHT TOTAL KNEE ARTHROPLASTY;  Surgeon: Gaynelle Arabian, MD;  Location: [facility];  Service: Orthopedics;  Laterality: Right;  . TRANSTHORACIC ECHOCARDIOGRAM  last one 05-19-2014   mild LVH/  ef 45-50%/  septal-lateral dyssynchroncy consistant with LBBB/  diffuse hypokinesis/  grade I diastolic dysfunction/  trivial MR and TR/  mild LAE  . TUBAL LIGATION    . VULVAR LESION REMOVAL  07/16/2012   Procedure: VULVAR LESION;  Surgeon: Imagene Gurney A. Alycia Rossetti, MD;  Location: [facility];  Service: Gynecology;  Laterality: Right;  Wide Local Excision of the Vulvar  . VULVECTOMY N/A 02/19/2015   Procedure: SIMPLE PARTIAL VULVECTOMY;  Surgeon: [name], MD;  Location: [facility];  Service: Gynecology;  Laterality: N/A;  . VULVECTOMY N/A 06/12/2019   Procedure: wide local excision vulvectomy;  Surgeon: [name], MD;  Location: [facility];  Service: Gynecology;  Laterality: N/A;  . VULVECTOMY PARTIAL N/A 10/01/2018   Procedure: VULVECTOMY PARTIAL;  Surgeon: [name], MD;  Location: [facility];  Service: Gynecology;  Laterality: N/A;     Family History  Problem Relation Age of Onset  . Heart attack Father   . Breast cancer Sister        mets   . Heart attack Brother   . Kidney failure Sister   . Colon cancer Neg Hx   . Esophageal cancer Neg Hx   . Pancreatic cancer Neg Hx   . Liver disease Neg Hx      Social History   Socioeconomic History  . Marital status: Widowed    Spouse name: Not on file  . Number of children: 4  . Years of education: Not on file  . Highest education level: Some college, no degree  Occupational History  . Occupation: retired  Scientific laboratory technician  . Financial resource strain: Not on file  . Food insecurity    Worry: Not on file    Inability: Not on file  . Transportation needs    Medical: Not on file    Non-medical: Not on file  Tobacco Use  . Smoking status: Never Smoker  . Smokeless tobacco: Never Used  . Tobacco comment: NEVER USED TOBACCO  Substance and Sexual Activity  . Alcohol use: No    Alcohol/week: 0.0 standard drinks  . Drug use: No  . Sexual activity: Not Currently  Lifestyle  . Physical activity    Days per week: Not on file    Minutes per session: Not on file  . Stress: Not on file  Relationships  . Social Herbalist on phone: Not on file    Gets together: Not on file    Attends religious service: Not on file    Active member of club or organization: Not on file    Attends meetings of clubs or organizations: Not on file    Relationship status: Not on file  . Intimate partner violence    Fear of current or ex partner: Not on file    Emotionally abused: Not on file    Physically abused: Not on file    Forced sexual activity: Not on file  Other Topics  Concern  . Not on file  Social History Narrative   Lives alone in an independent living facility.  Has 4 children.  Retired Optometrist for [employer]  Education: some college.      BP 140/82   Pulse 91   Ht 5' 2.5" (1.588 m)   Wt 214 lb (97.1 kg)   SpO2 94%   BMI 38.52 kg/m   Physical Exam:  Well appearing NAD HEENT: Unremarkable Neck:  No JVD, no thyromegally Lymphatics:  No adenopathy Back:  No CVA  tenderness Lungs:  Clear with no wheezes HEART:  Regular rate rhythm, no murmurs, no rubs, no clicks Abd:  soft, positive bowel sounds, no organomegally, no rebound, no guarding Ext:  2 plus pulses, no edema, no cyanosis, no clubbing Skin:  No rashes no nodules Neuro:  CN II through XII intact, motor grossly intact  EKG - NSR with biv pacing  DEVICE  Normal device function.  See PaceArt for details.   Assess/Plan: 1. PPM - she is s/p upgrade to a biv ppm and her device is working normally.  2. Chronic systolic heart failure - her symptoms are much improved after biv ppm insertion 3. Peripheral edema - she has left more than right foot/leg swelling. Looks like lymphedema. I encouraged her to wear support stockings. She will double up on her lasix for 3 days.  Mikle Bosworth.D.

## 2019-07-24 ENCOUNTER — Telehealth: Payer: Self-pay | Admitting: Oncology

## 2019-07-24 ENCOUNTER — Encounter: Payer: Self-pay | Admitting: Neurology

## 2019-07-24 ENCOUNTER — Other Ambulatory Visit: Payer: Self-pay

## 2019-07-24 NOTE — Telephone Encounter (Signed)
[name] [name] called and said Dr. Denman George was going to order a mammogram for a left breast spot at [facility].  She has an appointment on 08/05/19.  Advised her that we will check with Dr. Denman George and call her back.

## 2019-07-28 ENCOUNTER — Ambulatory Visit: Payer: Medicare Other | Admitting: Neurology

## 2019-07-28 ENCOUNTER — Other Ambulatory Visit: Payer: Self-pay

## 2019-07-28 DIAGNOSIS — G5711 Meralgia paresthetica, right lower limb: Secondary | ICD-10-CM

## 2019-07-28 NOTE — Progress Notes (Signed)
No show

## 2019-08-01 ENCOUNTER — Telehealth: Payer: Self-pay | Admitting: *Deleted

## 2019-08-01 LAB — CUP PACEART INCLINIC DEVICE CHECK
Battery Remaining Longevity: 111 mo
Battery Voltage: 3.04 V
Brady Statistic AP VP Percent: 0.71 %
Brady Statistic AP VS Percent: 0.01 %
Brady Statistic AS VP Percent: 97.64 %
Brady Statistic AS VS Percent: 1.64 %
Brady Statistic RA Percent Paced: 0.89 %
Brady Statistic RV Percent Paced: 92.88 %
Date Time Interrogation Session: 20200805195620
Implantable Lead Implant Date: 20090818
Implantable Lead Implant Date: 20090818
Implantable Lead Implant Date: 20200103
Implantable Lead Location: 753858
Implantable Lead Location: 753859
Implantable Lead Location: 753860
Implantable Lead Model: 4598
Implantable Lead Model: 5076
Implantable Lead Model: 5076
Implantable Pulse Generator Implant Date: 20200103
Lead Channel Impedance Value: 323 Ohm
Lead Channel Impedance Value: 342 Ohm
Lead Channel Impedance Value: 380 Ohm
Lead Channel Impedance Value: 380 Ohm
Lead Channel Impedance Value: 418 Ohm
Lead Channel Impedance Value: 456 Ohm
Lead Channel Impedance Value: 551 Ohm
Lead Channel Impedance Value: 589 Ohm
Lead Channel Impedance Value: 608 Ohm
Lead Channel Impedance Value: 608 Ohm
Lead Channel Impedance Value: 665 Ohm
Lead Channel Pacing Threshold Amplitude: 0.5 V
Lead Channel Pacing Threshold Amplitude: 1.25 V
Lead Channel Pacing Threshold Amplitude: 1.375 V
Lead Channel Pacing Threshold Pulse Width: 0.4 ms
Lead Channel Pacing Threshold Pulse Width: 0.4 ms
Lead Channel Pacing Threshold Pulse Width: 0.4 ms
Lead Channel Sensing Intrinsic Amplitude: 1.625 mV
Lead Channel Sensing Intrinsic Amplitude: 1.625 mV
Lead Channel Sensing Intrinsic Amplitude: 6 mV
Lead Channel Sensing Intrinsic Amplitude: 6 mV
Lead Channel Setting Pacing Amplitude: 1.5 V
Lead Channel Setting Pacing Amplitude: 2 V
Lead Channel Setting Pacing Amplitude: 2.5 V
Lead Channel Setting Pacing Pulse Width: 0.4 ms
Lead Channel Setting Pacing Pulse Width: 0.4 ms
Lead Channel Setting Sensing Sensitivity: 1.2 mV

## 2019-08-01 NOTE — Telephone Encounter (Signed)
Faxed signed shett for mammogram to [facility]

## 2019-09-04 ENCOUNTER — Telehealth: Payer: Self-pay | Admitting: *Deleted

## 2019-09-04 NOTE — Telephone Encounter (Signed)
Patient called and scheduled a follow up appt for October

## 2019-09-23 ENCOUNTER — Ambulatory Visit: Payer: Medicare Other | Admitting: *Deleted

## 2019-09-24 LAB — CUP PACEART REMOTE DEVICE CHECK
Battery Remaining Longevity: 114 mo
Battery Voltage: 3.03 V
Brady Statistic AP VP Percent: 1.02 %
Brady Statistic AP VS Percent: 0.01 %
Brady Statistic AS VP Percent: 97.74 %
Brady Statistic AS VS Percent: 1.23 %
Brady Statistic RA Percent Paced: 1.03 %
Brady Statistic RV Percent Paced: 87.01 %
Date Time Interrogation Session: 20201006014201
Implantable Lead Implant Date: 20090818
Implantable Lead Implant Date: 20090818
Implantable Lead Implant Date: 20200103
Implantable Lead Location: 753858
Implantable Lead Location: 753859
Implantable Lead Location: 753860
Implantable Lead Model: 4598
Implantable Lead Model: 5076
Implantable Lead Model: 5076
Implantable Pulse Generator Implant Date: 20200103
Lead Channel Impedance Value: 323 Ohm
Lead Channel Impedance Value: 342 Ohm
Lead Channel Impedance Value: 342 Ohm
Lead Channel Impedance Value: 361 Ohm
Lead Channel Impedance Value: 399 Ohm
Lead Channel Impedance Value: 418 Ohm
Lead Channel Impedance Value: 437 Ohm
Lead Channel Impedance Value: 456 Ohm
Lead Channel Impedance Value: 456 Ohm
Lead Channel Impedance Value: 532 Ohm
Lead Channel Impedance Value: 627 Ohm
Lead Channel Impedance Value: 646 Ohm
Lead Channel Impedance Value: 646 Ohm
Lead Channel Impedance Value: 741 Ohm
Lead Channel Pacing Threshold Amplitude: 0.5 V
Lead Channel Pacing Threshold Amplitude: 1.125 V
Lead Channel Pacing Threshold Amplitude: 1.25 V
Lead Channel Pacing Threshold Pulse Width: 0.4 ms
Lead Channel Pacing Threshold Pulse Width: 0.4 ms
Lead Channel Pacing Threshold Pulse Width: 0.4 ms
Lead Channel Sensing Intrinsic Amplitude: 1.5 mV
Lead Channel Sensing Intrinsic Amplitude: 1.5 mV
Lead Channel Sensing Intrinsic Amplitude: 5.25 mV
Lead Channel Sensing Intrinsic Amplitude: 5.25 mV
Lead Channel Setting Pacing Amplitude: 1.5 V
Lead Channel Setting Pacing Amplitude: 1.75 V
Lead Channel Setting Pacing Amplitude: 2.5 V
Lead Channel Setting Pacing Pulse Width: 0.4 ms
Lead Channel Setting Pacing Pulse Width: 0.4 ms
Lead Channel Setting Sensing Sensitivity: 1.2 mV

## 2019-10-06 ENCOUNTER — Other Ambulatory Visit: Payer: Self-pay

## 2019-10-06 ENCOUNTER — Encounter: Payer: Self-pay | Admitting: Gynecologic Oncology

## 2019-10-06 ENCOUNTER — Inpatient Hospital Stay: Payer: Medicare Other | Attending: Gynecologic Oncology | Admitting: Gynecologic Oncology

## 2019-10-06 VITALS — BP 148/90 | HR 87 | Temp 98.7°F | Resp 20 | Ht 64.5 in | Wt 210.2 lb

## 2019-10-06 DIAGNOSIS — C519 Malignant neoplasm of vulva, unspecified: Secondary | ICD-10-CM | POA: Diagnosis not present

## 2019-10-06 NOTE — Patient Instructions (Addendum)
Please follow-up with the Gynecologic Oncology team at [facility] in 3 months (January, 2021). Dr Serita Grit office will facilitate this transfer of care for you and her office will call you with the appointment details.  Dr Serita Grit office can be reached at [phone].  The [place] office number is [phone].

## 2019-10-06 NOTE — Progress Notes (Signed)
Follow-up Note: Gyn-Onc   [name] [name] 83 y.o. female  Chief Complaint  Patient presents with  . 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   Margins were close.  There was minimal (90mm) of soft tissue invasion. And close (32mm deep margin, 45mm peripheral).  She understands that there is a high probability of recurrence which may require either surgery or radiation at that time.  She has relocated to [facility] and would like to follow-up with one of my partners who works in [place]. I will have an appointment made with one of my colleagues at [facility].   HPI: February 19, 2015 the patient underwent a partial simple vulvectomy for VIN 3.  Final pathology showed VIN 3 with a focal positive vaginal margin. She saw Dr. Benjie Karvonen who obtained a Pap smear showing ASCUS with high risk HPV in 2019.  In addition a vulvar lesion was seen and biopsied returning VIN 3. Colposcopy with Dr Fermin Schwab in September, 2019 of the vagina did not show high grade lesions.  She underwent wide local excision of the vulva on 10/01/18 for VIN 3. Pathology showed VIN 3 with positive margins at 3 and 9 o'clock focally. No macroscopic residual disease.  She was scheduled for follow-up in March, 2020 however this was postponed due to the COVID-19 pandemic. At that time she began experiencing right vulva pain and began to feel a mass.  It was biopsied in the office on 05/21/19 and this showed VIN3.   On June 12, 2019 she underwent partial vulvectomy on the right.  Final pathology showed a squamous cell carcinoma with 1 mm of invasion.  No LVSI was identified.  It was a well differentiated lesion.  The deep margin was 1 mm and the peripheral margin was 2 mm.  It was arising in the background of VIN 3. Re-excision vs radiation was discussed with the patient, however, due to her medical co-morbidities and advanced age she elected for close  follow-up.   Interval History:  She returned today for scheduled follow-up. She has no symptoms concerning for recurrence. She has relocated to [place], [place].   Review of Systems:10 point review of systems is negative except as noted in interval history.   Vitals: Blood pressure (!) 148/90, pulse 87, temperature 98.7 F (37.1 C), temperature source Temporal, resp. rate 20, height 5' 4.5" (1.638 m), weight 210 lb 4 oz (95.4 kg), SpO2 97 %.  Physical Exam: General : The patient is a healthy woman in no acute distress.  HEENT: normocephalic, extraoccular movements normal; neck is supple without thyromegally  Lynphnodes: Supraclavicular and inguinal nodes not enlarged  Abdomen: Soft, non-tender, no ascites, no organomegally, no masses, no hernias  Pelvic:  EGBUS: At the introitus (mid way) on the right there is a well healed incision parallel to the introitus. Not fixed to underlying structures. Vagina: Normal, no lesions.   Urethra and Bladder: Normal, non-tender  Cervix: Surgically absent  Uterus: Surgically absent  Bi-manual examination: Non-tender; no adenxal masses or nodularity  Rectal: normal sphincter tone, no masses, no blood  Lower extremities: No edema or varicosities. Normal range of motion      Allergies  Allergen Reactions  . Cephalexin Diarrhea  . Imitrex [Sumatriptan] Other (See Comments)    Reaction unknown  . Sulfa Antibiotics Nausea Only    Doesn't like to drink lots of water.  Past Medical History:  Diagnosis Date  . Allergic rhinitis   . Anxiety   . Aortic atherosclerosis ([place])   . Cardiac pacemaker in situ    DOI  08-04-2008  medtronic dual chamber  . Chronic anticoagulation   . Chronic combined systolic and diastolic CHF, NYHA class 2 ([place])    montior by cardiologist-- dr taylor--  ef 45%  . Coronary artery disease CARDIOLOGIST-- DR Cristopher Peru   per cath-- Non-obstructive minimal CAD  . Fluid retention   . GERD (gastroesophageal reflux disease)    . Hemorrhoids   . Hiatal hernia   . History of cervical dysplasia    CIN III  . History of CVA (cerebrovascular accident)    09-15-2005  Left Cerebellar Hematoma with mild stem compression whild on coumadin for DVT  . History of DVT of lower extremity   . History of non-ST elevation myocardial infarction (NSTEMI)    2011-- in setting of bilateral PE's and DVT's and CHF  . History of peptic ulcer disease   . History of pulmonary edema 07/25/2010  . History of pulmonary embolus (PE)   . Hyperlipidemia   . Hypertension   . Incontinence of urine   . LBBB (left bundle branch block)   . LVH (left ventricular hypertrophy) 07/16/2018   Moderate, noted on ECHO  . Mild obstructive sleep apnea    PT USES Humidied Nasal Mask with O2 at2.5L (study 08-05-2009)  . Mild pulmonary hypertension ([place])   . Moderate intermittent asthma   . Nocturnal leg cramps   . OA (osteoarthritis)   . PAF (paroxysmal atrial fibrillation) ([place])   . Peripheral edema   . Renal insufficiency   . Sarcoidosis   . Shortness of breath    OCCASIONAL  . Sigmoid diverticulosis    Mild  . Stokes-Adams syncope    asymptomatic  . Thrombophilia ([place])    hemotologist--  dr Marin Olp--  monitors pt anitcoagulation  . Urgency of urination   . Venous insufficiency   . VIN III (vulvar intraepithelial neoplasia III)   . Wears glasses    reading    Past Surgical History:  Procedure Laterality Date  . ABDOMINAL HYSTERECTOMY  1970's  . APPENDECTOMY  1966  . BIV UPGRADE  12/20/2018  . BIV UPGRADE N/A 12/20/2018   Procedure: BIV PPM UPGRADE;  Surgeon: Evans Lance, MD;  Location: [facility];  Service: Cardiovascular;  Laterality: N/A;  . CARDIAC CATHETERIZATION  08-26-2003   dr Lorenz Coaster. non-obstructive CAD & LVD with ef 46%, global HK, 2+MR, PA press 50/24 w/ wedge 20  . CARDIAC CATHETERIZATION  08-03-2008   dr Angelena Form   Non-obstructive CAD/  30-40% pLAD,  30% ostial CFX/  symptomatic bradycardia/  normal  renal arteries  . CARDIAC CATHETERIZATION N/A 07/21/2016   Procedure: Right Heart Cath;  Surgeon: Larey Dresser, MD;  Location: [facility];  Service: Cardiovascular;  Laterality: N/A;  . CARDIAC PACEMAKER PLACEMENT  08-04-2008  dr Lovena Le gregg   for CHB--  MEDTRONIC DUAL CHAMBER  . CATARACTS REMOVED    . CHOLECYSTECTOMY  01/1996  . DECCOMPRESSION ULNAR NERVE AT CUBITAL TUNNEL AND RESECTION MUSCLE Left 03-17-2008  . EXCISION LEFT SUPRACLAVICULAR LYMPH NODE   04-22-2007   granulomatous inflammation, extensive  . INSERTION OF VENA CAVA FILTER  08/ 2011  . KNEE ARTHROSCOPY     LEFT  . LUMBAR [place] SURGERY  x2   1990  . SHOULDER ARTHROSCOPY WITH ROTATOR CUFF REPAIR  AND SUBACROMIAL DECOMPRESSION Right 02-08-2009  . TOTAL KNEE ARTHROPLASTY Left 04/21/2013   Procedure: LEFT TOTAL KNEE ARTHROPLASTY;  Surgeon: Gearlean Alf, MD;  [facility];  Service: Orthopedics;  Laterality: Left;  . TOTAL KNEE ARTHROPLASTY Right 04/03/2016   Procedure: RIGHT TOTAL KNEE ARTHROPLASTY;  Surgeon: Gaynelle Arabian, MD;  [facility];  Service: Orthopedics;  Laterality: Right;  . TRANSTHORACIC ECHOCARDIOGRAM  last one 05-19-2014   mild LVH/  ef 45-50%/  septal-lateral dyssynchroncy consistant with LBBB/  diffuse hypokinesis/  grade I diastolic dysfunction/  trivial MR and TR/  mild LAE  . TUBAL LIGATION    . VULVAR LESION REMOVAL  07/16/2012   Procedure: VULVAR LESION;  Surgeon: Imagene Gurney A. Alycia Rossetti, MD;  [facility];  Service: Gynecology;  Laterality: Right;  Wide Local Excision of the Vulvar  . VULVECTOMY N/A 02/19/2015   Procedure: SIMPLE PARTIAL VULVECTOMY;  Surgeon: [name], MD;  Location: [facility];  Service: Gynecology;  Laterality: N/A;  . VULVECTOMY N/A 06/12/2019   Procedure: wide local excision vulvectomy;  Surgeon: [name], MD;  Location: [facility];  Service: Gynecology;  Laterality: N/A;  . VULVECTOMY PARTIAL N/A 10/01/2018   Procedure: VULVECTOMY PARTIAL;   Surgeon: [name], MD;  Location: [facility];  Service: Gynecology;  Laterality: N/A;    Current Outpatient Medications  Medication Sig Dispense Refill  . acetaminophen (TYLENOL) 325 MG tablet Take 1-2 tablets (325-650 mg total) by mouth every 4 (four) hours as needed for mild pain.    . Azelastine HCl 0.15 % SOLN USE 1 TO 2 SPRAYS IN EACH  NOSTRIL DAILY (Patient taking differently: Place 1-2 sprays into both nostrils daily as needed (congestion). ) 30 mL 5  . budesonide-formoterol (SYMBICORT) 160-4.5 MCG/ACT inhaler Inhale 2 puffs into the lungs 2 (two) times daily.    . Calcium Carbonate-Vit D-Min (CALCIUM 1200) 1200-1000 MG-UNIT CHEW Chew 1 tablet by mouth daily.    . cetirizine (ZYRTEC) 10 MG tablet Take 10 mg by mouth every morning.     . clorazepate (TRANXENE) 7.5 MG tablet Take 1 tablet (7.5 mg total) by mouth 3 (three) times daily. 90 tablet 5  . furosemide (LASIX) 40 MG tablet TAKE 1 TABLET BY MOUTH  TWICE A DAY 180 tablet 3  . gabapentin (NEURONTIN) 100 MG capsule 3 CAPSULES AT BEDTIME. 270 capsule 2  . lidocaine (XYLOCAINE) 5 % ointment Apply to right thigh twice daily as needed for pain. 50 g 5  . losartan (COZAAR) 25 MG tablet Take 25 mg by mouth daily.     . magic mouthwash SOLN Swish and swallow 5 ml 4 times daily if needed (Patient taking differently: Take 5 mLs by mouth 4 (four) times daily as needed for mouth pain (sore throat). ) 200 mL 2  . metoprolol succinate (TOPROL-XL) 25 MG 24 hr tablet Take 1 tablet by mouth  daily before breakfast (Patient taking differently: Take 25 mg by mouth daily. ) 90 tablet 3  . montelukast (SINGULAIR) 10 MG tablet Take 10 mg by mouth daily.     . Multiple Vitamins-Minerals (PROTEGRA) CAPS Take 1 capsule by mouth daily.    [name] Kitchen oxyCODONE-acetaminophen (PERCOCET/ROXICET) 5-325 MG tablet Take 1 tablet by mouth every 4 (four) hours as needed for severe pain. 30 tablet 0  . pantoprazole (PROTONIX) 40 MG tablet Take 1 tablet by mouth   twice a day 180 tablet 3  . Polyethyl Glycol-Propyl Glycol (SYSTANE) 0.4-0.3 % SOLN Place 2 drops  into both eyes 4 (four) times daily as needed (dryness).     . potassium chloride (K-DUR,KLOR-CON) 10 MEQ tablet TAKE 2 TABLETS BY MOUTH  DAILY (Patient taking differently: Take 10 mEq by mouth 2 (two) times daily. ) 180 tablet 3  . pravastatin (PRAVACHOL) 20 MG tablet Take 20 mg by mouth at bedtime.     . rivaroxaban (XARELTO) 20 MG TABS tablet Take 1 tablet (20 mg total) by mouth daily. 90 tablet 6  . senna (SENOKOT) 8.6 MG TABS tablet Take 1 tablet (8.6 mg total) by mouth at bedtime. 120 tablet 0  . senna (SENOKOT) 8.6 MG TABS tablet Take 1 tablet (8.6 mg total) by mouth at bedtime. 30 tablet 1   No current facility-administered medications for this visit.     Social History   Socioeconomic History  . Marital status: Widowed    Spouse name: Not on file  . Number of children: 4  . Years of education: Not on file  . Highest education level: Some college, no degree  Occupational History  . Occupation: retired  Scientific laboratory technician  . Financial resource strain: Not on file  . Food insecurity    Worry: Not on file    Inability: Not on file  . Transportation needs    Medical: Not on file    Non-medical: Not on file  Tobacco Use  . Smoking status: Never Smoker  . Smokeless tobacco: Never Used  . Tobacco comment: NEVER USED TOBACCO  Substance and Sexual Activity  . Alcohol use: No    Alcohol/week: 0.0 standard drinks  . Drug use: No  . Sexual activity: Not Currently  Lifestyle  . Physical activity    Days per week: Not on file    Minutes per session: Not on file  . Stress: Not on file  Relationships  . Social Herbalist on phone: Not on file    Gets together: Not on file    Attends religious service: Not on file    Active member of club or organization: Not on file    Attends meetings of clubs or organizations: Not on file    Relationship status: Not on file  . Intimate  partner violence    Fear of current or ex partner: Not on file    Emotionally abused: Not on file    Physically abused: Not on file    Forced sexual activity: Not on file  Other Topics Concern  . Not on file  Social History Narrative   Lives alone in an independent living facility.  Has 4 children.  Retired Optometrist for [employer]  Education: some college.    Right handed     Family History  Problem Relation Age of Onset  . Heart attack Father   . Breast cancer Sister        mets  . Heart attack Brother   . Kidney failure Sister   . Colon cancer Neg Hx   . Esophageal cancer Neg Hx   . Pancreatic cancer Neg Hx   . Liver disease Neg Hx     Thereasa Solo, MD 10/06/2019, 2:09 PM

## 2019-10-08 ENCOUNTER — Telehealth: Payer: Self-pay | Admitting: *Deleted

## 2019-10-08 NOTE — Telephone Encounter (Signed)
Per Dr. Denman George fax referral and records to [facility]

## 2019-10-23 ENCOUNTER — Telehealth: Payer: Self-pay | Admitting: *Deleted

## 2019-10-23 NOTE — Telephone Encounter (Signed)
Called and left the patient a message to call the office back. Need to check on appt in 3 months with [facility]

## 2019-10-24 ENCOUNTER — Telehealth: Payer: Self-pay | Admitting: *Deleted

## 2019-10-24 NOTE — Telephone Encounter (Signed)
Called and left the patient a message to call the office. Need to check on her follow up appt at [place]

## 2019-10-27 ENCOUNTER — Telehealth: Payer: Self-pay | Admitting: *Deleted

## 2019-10-27 NOTE — Telephone Encounter (Signed)
Patient returned call and stated that "I see Dr. Neomia Glass on Jan 13th"

## 2019-10-31 ENCOUNTER — Other Ambulatory Visit: Payer: Self-pay | Admitting: Internal Medicine

## 2024-06-02 ENCOUNTER — Telehealth: Payer: Self-pay

## 2024-06-02 NOTE — Telephone Encounter (Unsigned)
 Copied from CRM [id number]. Topic: General - Other >> Jun 02, 2024  3:12 PM Whitney O wrote: Reason for CRM: : donna calling from [facility] in [place]  about patient oxygen and supplies . [facility] they are ordering her oxygen and we at [facility] supply this to patient . Ms [name] [name] is needing chart notes and the order . Looking for everything current so we can continue to send her oxygen  Fax number [phone] [name] and she did say yall could leave a message because it is a secure voicemail  That ms donna will be sending over a fax as well

## 2024-06-03 NOTE — Telephone Encounter (Signed)
 I have called the number given by [name] [name] with [facility] in [place], [place] . I left message stating the patient hasn't been seen by [facility] since 2017. She most recently has been seen by [facility] in [place]. She will need to call them
